# Patient Record
Sex: Female | Born: 1965
Health system: Southern US, Community
[De-identification: ages and names within clinical notes are randomized; demographics above are authoritative.]

## PROBLEM LIST (undated history)

## (undated) DIAGNOSIS — R600 Localized edema: Secondary | ICD-10-CM

## (undated) DIAGNOSIS — K829 Disease of gallbladder, unspecified: Secondary | ICD-10-CM

## (undated) DIAGNOSIS — R112 Nausea with vomiting, unspecified: Secondary | ICD-10-CM

## (undated) DIAGNOSIS — N979 Female infertility, unspecified: Secondary | ICD-10-CM

## (undated) DIAGNOSIS — J45909 Unspecified asthma, uncomplicated: Secondary | ICD-10-CM

## (undated) DIAGNOSIS — B350 Tinea barbae and tinea capitis: Secondary | ICD-10-CM

## (undated) DIAGNOSIS — Z9889 Other specified postprocedural states: Secondary | ICD-10-CM

## (undated) DIAGNOSIS — E669 Obesity, unspecified: Secondary | ICD-10-CM

## (undated) DIAGNOSIS — E559 Vitamin D deficiency, unspecified: Secondary | ICD-10-CM

## (undated) DIAGNOSIS — F1010 Alcohol abuse, uncomplicated: Secondary | ICD-10-CM

## (undated) DIAGNOSIS — L4050 Arthropathic psoriasis, unspecified: Secondary | ICD-10-CM

## (undated) DIAGNOSIS — Z973 Presence of spectacles and contact lenses: Secondary | ICD-10-CM

## (undated) DIAGNOSIS — F32A Depression, unspecified: Secondary | ICD-10-CM

## (undated) DIAGNOSIS — I4891 Unspecified atrial fibrillation: Secondary | ICD-10-CM

## (undated) DIAGNOSIS — J189 Pneumonia, unspecified organism: Secondary | ICD-10-CM

## (undated) DIAGNOSIS — E7800 Pure hypercholesterolemia, unspecified: Secondary | ICD-10-CM

## (undated) DIAGNOSIS — T7840XA Allergy, unspecified, initial encounter: Secondary | ICD-10-CM

## (undated) DIAGNOSIS — G4730 Sleep apnea, unspecified: Secondary | ICD-10-CM

## (undated) DIAGNOSIS — M654 Radial styloid tenosynovitis [de Quervain]: Secondary | ICD-10-CM

## (undated) DIAGNOSIS — I499 Cardiac arrhythmia, unspecified: Secondary | ICD-10-CM

## (undated) DIAGNOSIS — R002 Palpitations: Secondary | ICD-10-CM

## (undated) DIAGNOSIS — F1990 Other psychoactive substance use, unspecified, uncomplicated: Secondary | ICD-10-CM

## (undated) DIAGNOSIS — E282 Polycystic ovarian syndrome: Secondary | ICD-10-CM

## (undated) DIAGNOSIS — F419 Anxiety disorder, unspecified: Secondary | ICD-10-CM

## (undated) DIAGNOSIS — D689 Coagulation defect, unspecified: Secondary | ICD-10-CM

## (undated) DIAGNOSIS — M1990 Unspecified osteoarthritis, unspecified site: Secondary | ICD-10-CM

## (undated) DIAGNOSIS — M549 Dorsalgia, unspecified: Secondary | ICD-10-CM

## (undated) DIAGNOSIS — K760 Fatty (change of) liver, not elsewhere classified: Secondary | ICD-10-CM

## (undated) DIAGNOSIS — D649 Anemia, unspecified: Secondary | ICD-10-CM

## (undated) DIAGNOSIS — M2550 Pain in unspecified joint: Secondary | ICD-10-CM

## (undated) DIAGNOSIS — R0602 Shortness of breath: Secondary | ICD-10-CM

## (undated) HISTORY — DX: Unspecified atrial fibrillation: I48.91

## (undated) HISTORY — DX: Vitamin D deficiency, unspecified: E55.9

## (undated) HISTORY — DX: Female infertility, unspecified: N97.9

## (undated) HISTORY — DX: Polycystic ovarian syndrome: E28.2

## (undated) HISTORY — DX: Unspecified asthma, uncomplicated: J45.909

## (undated) HISTORY — PX: BREAST BIOPSY: SHX20

## (undated) HISTORY — DX: Tinea barbae and tinea capitis: B35.0

## (undated) HISTORY — DX: Dorsalgia, unspecified: M54.9

## (undated) HISTORY — DX: Allergy, unspecified, initial encounter: T78.40XA

## (undated) HISTORY — DX: Pain in unspecified joint: M25.50

## (undated) HISTORY — DX: Radial styloid tenosynovitis (de quervain): M65.4

## (undated) HISTORY — DX: Other psychoactive substance use, unspecified, uncomplicated: F19.90

## (undated) HISTORY — PX: COLONOSCOPY: SHX174

## (undated) HISTORY — DX: Anemia, unspecified: D64.9

## (undated) HISTORY — DX: Localized edema: R60.0

## (undated) HISTORY — PX: WISDOM TOOTH EXTRACTION: SHX21

## (undated) HISTORY — DX: Shortness of breath: R06.02

## (undated) HISTORY — DX: Sleep apnea, unspecified: G47.30

## (undated) HISTORY — DX: Cardiac arrhythmia, unspecified: I49.9

## (undated) HISTORY — DX: Obesity, unspecified: E66.9

## (undated) HISTORY — DX: Pure hypercholesterolemia, unspecified: E78.00

## (undated) HISTORY — DX: Unspecified osteoarthritis, unspecified site: M19.90

## (undated) HISTORY — DX: Alcohol abuse, uncomplicated: F10.10

## (undated) HISTORY — DX: Coagulation defect, unspecified: D68.9

## (undated) HISTORY — DX: Palpitations: R00.2

## (undated) HISTORY — PX: KNEE ARTHROPLASTY: SHX992

## (undated) HISTORY — PX: OTHER SURGICAL HISTORY: SHX169

## (undated) HISTORY — PX: CARPAL TUNNEL RELEASE: SHX101

## (undated) HISTORY — DX: Disease of gallbladder, unspecified: K82.9

## (undated) HISTORY — DX: Depression, unspecified: F32.A

## (undated) HISTORY — DX: Fatty (change of) liver, not elsewhere classified: K76.0

---

## 1998-01-11 ENCOUNTER — Encounter: Admission: RE | Admit: 1998-01-11 | Discharge: 1998-04-11 | Payer: Self-pay | Admitting: Internal Medicine

## 1998-05-10 ENCOUNTER — Encounter: Admission: RE | Admit: 1998-05-10 | Discharge: 1998-08-08 | Payer: Self-pay | Admitting: Internal Medicine

## 1998-09-03 ENCOUNTER — Other Ambulatory Visit: Admission: RE | Admit: 1998-09-03 | Discharge: 1998-09-03 | Payer: Self-pay | Admitting: Urology

## 1999-01-27 ENCOUNTER — Encounter: Admission: RE | Admit: 1999-01-27 | Discharge: 1999-02-25 | Payer: Self-pay | Admitting: *Deleted

## 1999-02-07 ENCOUNTER — Encounter: Admission: RE | Admit: 1999-02-07 | Discharge: 1999-05-08 | Payer: Self-pay | Admitting: Internal Medicine

## 1999-06-02 ENCOUNTER — Encounter: Payer: Self-pay | Admitting: Obstetrics and Gynecology

## 1999-06-02 ENCOUNTER — Ambulatory Visit (HOSPITAL_COMMUNITY): Admission: RE | Admit: 1999-06-02 | Discharge: 1999-06-02 | Payer: Self-pay | Admitting: Obstetrics and Gynecology

## 1999-06-13 HISTORY — PX: WRIST SURGERY: SHX841

## 1999-07-20 ENCOUNTER — Other Ambulatory Visit: Admission: RE | Admit: 1999-07-20 | Discharge: 1999-07-20 | Payer: Self-pay | Admitting: Obstetrics and Gynecology

## 1999-10-03 ENCOUNTER — Ambulatory Visit (HOSPITAL_BASED_OUTPATIENT_CLINIC_OR_DEPARTMENT_OTHER): Admission: RE | Admit: 1999-10-03 | Discharge: 1999-10-03 | Payer: Self-pay | Admitting: Orthopedic Surgery

## 2000-04-30 ENCOUNTER — Ambulatory Visit (HOSPITAL_COMMUNITY): Admission: RE | Admit: 2000-04-30 | Discharge: 2000-04-30 | Payer: Self-pay | Admitting: Otolaryngology

## 2000-04-30 ENCOUNTER — Encounter: Payer: Self-pay | Admitting: Otolaryngology

## 2000-10-11 ENCOUNTER — Other Ambulatory Visit: Admission: RE | Admit: 2000-10-11 | Discharge: 2000-10-11 | Payer: Self-pay | Admitting: Obstetrics and Gynecology

## 2001-05-30 ENCOUNTER — Encounter: Payer: Self-pay | Admitting: *Deleted

## 2001-05-30 ENCOUNTER — Encounter: Admission: RE | Admit: 2001-05-30 | Discharge: 2001-05-30 | Payer: Self-pay | Admitting: *Deleted

## 2001-07-13 ENCOUNTER — Inpatient Hospital Stay (HOSPITAL_COMMUNITY): Admission: AD | Admit: 2001-07-13 | Discharge: 2001-07-13 | Payer: Self-pay | Admitting: Obstetrics and Gynecology

## 2001-11-21 ENCOUNTER — Other Ambulatory Visit: Admission: RE | Admit: 2001-11-21 | Discharge: 2001-11-21 | Payer: Self-pay | Admitting: Obstetrics and Gynecology

## 2003-04-28 ENCOUNTER — Ambulatory Visit (HOSPITAL_COMMUNITY): Admission: RE | Admit: 2003-04-28 | Discharge: 2003-04-28 | Payer: Self-pay | Admitting: Obstetrics and Gynecology

## 2003-07-06 ENCOUNTER — Other Ambulatory Visit: Admission: RE | Admit: 2003-07-06 | Discharge: 2003-07-06 | Payer: Self-pay | Admitting: Obstetrics and Gynecology

## 2004-03-22 ENCOUNTER — Encounter: Admission: RE | Admit: 2004-03-22 | Discharge: 2004-03-22 | Payer: Self-pay | Admitting: Obstetrics and Gynecology

## 2004-04-26 ENCOUNTER — Encounter: Admission: RE | Admit: 2004-04-26 | Discharge: 2004-04-26 | Payer: Self-pay | Admitting: Internal Medicine

## 2005-02-20 ENCOUNTER — Ambulatory Visit (HOSPITAL_COMMUNITY): Admission: RE | Admit: 2005-02-20 | Discharge: 2005-02-20 | Payer: Self-pay | Admitting: Family Medicine

## 2005-10-27 ENCOUNTER — Ambulatory Visit (HOSPITAL_COMMUNITY): Admission: RE | Admit: 2005-10-27 | Discharge: 2005-10-27 | Payer: Self-pay | Admitting: Allergy

## 2006-05-04 ENCOUNTER — Emergency Department (HOSPITAL_COMMUNITY): Admission: EM | Admit: 2006-05-04 | Discharge: 2006-05-04 | Payer: Self-pay | Admitting: Family Medicine

## 2006-05-29 ENCOUNTER — Encounter: Admission: RE | Admit: 2006-05-29 | Discharge: 2006-06-20 | Payer: Self-pay | Admitting: Occupational Medicine

## 2007-05-02 ENCOUNTER — Encounter: Admission: RE | Admit: 2007-05-02 | Discharge: 2007-05-02 | Payer: Self-pay | Admitting: Obstetrics and Gynecology

## 2007-12-01 ENCOUNTER — Emergency Department (HOSPITAL_COMMUNITY): Admission: EM | Admit: 2007-12-01 | Discharge: 2007-12-01 | Payer: Self-pay | Admitting: Family Medicine

## 2007-12-01 ENCOUNTER — Ambulatory Visit (HOSPITAL_COMMUNITY): Admission: RE | Admit: 2007-12-01 | Discharge: 2007-12-01 | Payer: Self-pay | Admitting: Emergency Medicine

## 2008-05-28 ENCOUNTER — Emergency Department (HOSPITAL_COMMUNITY): Admission: EM | Admit: 2008-05-28 | Discharge: 2008-05-28 | Payer: Self-pay | Admitting: Emergency Medicine

## 2008-09-04 ENCOUNTER — Encounter: Admission: RE | Admit: 2008-09-04 | Discharge: 2008-09-04 | Payer: Self-pay | Admitting: Obstetrics and Gynecology

## 2009-04-07 ENCOUNTER — Ambulatory Visit: Payer: Self-pay | Admitting: Family Medicine

## 2009-04-07 DIAGNOSIS — E669 Obesity, unspecified: Secondary | ICD-10-CM | POA: Insufficient documentation

## 2009-04-07 DIAGNOSIS — E282 Polycystic ovarian syndrome: Secondary | ICD-10-CM | POA: Insufficient documentation

## 2009-04-07 DIAGNOSIS — M999 Biomechanical lesion, unspecified: Secondary | ICD-10-CM | POA: Insufficient documentation

## 2009-05-10 ENCOUNTER — Encounter: Payer: Self-pay | Admitting: Family Medicine

## 2009-05-10 ENCOUNTER — Ambulatory Visit: Payer: Self-pay | Admitting: Family Medicine

## 2009-05-10 DIAGNOSIS — H60399 Other infective otitis externa, unspecified ear: Secondary | ICD-10-CM | POA: Insufficient documentation

## 2009-05-10 LAB — CONVERTED CEMR LAB
Cholesterol: 204 mg/dL — ABNORMAL HIGH (ref 0–200)
Glucose, Bld: 116 mg/dL — ABNORMAL HIGH (ref 70–99)
HDL: 59 mg/dL (ref >39)
LDL Cholesterol: 121 mg/dL — ABNORMAL HIGH (ref 0–99)
Total CHOL/HDL Ratio: 3.5
Triglycerides: 118 mg/dL (ref <150)
VLDL: 24 mg/dL (ref 0–40)

## 2009-05-16 ENCOUNTER — Encounter: Payer: Self-pay | Admitting: Family Medicine

## 2009-05-25 ENCOUNTER — Ambulatory Visit: Payer: Self-pay | Admitting: Family Medicine

## 2009-05-25 DIAGNOSIS — E785 Hyperlipidemia, unspecified: Secondary | ICD-10-CM

## 2009-05-25 DIAGNOSIS — E1169 Type 2 diabetes mellitus with other specified complication: Secondary | ICD-10-CM | POA: Insufficient documentation

## 2009-05-25 DIAGNOSIS — M999 Biomechanical lesion, unspecified: Secondary | ICD-10-CM | POA: Insufficient documentation

## 2009-06-22 ENCOUNTER — Ambulatory Visit: Payer: Self-pay | Admitting: Family Medicine

## 2009-06-23 ENCOUNTER — Ambulatory Visit: Payer: Self-pay | Admitting: Family Medicine

## 2009-06-23 LAB — CONVERTED CEMR LAB
Cholesterol, target level: 200 mg/dL
HDL goal, serum: 40 mg/dL
LDL Goal: 160 mg/dL

## 2009-07-20 ENCOUNTER — Ambulatory Visit: Payer: Self-pay | Admitting: Family Medicine

## 2009-08-30 ENCOUNTER — Ambulatory Visit: Payer: Self-pay | Admitting: Family Medicine

## 2009-09-22 ENCOUNTER — Encounter: Payer: Self-pay | Admitting: Family Medicine

## 2010-03-15 ENCOUNTER — Ambulatory Visit: Payer: Self-pay | Admitting: Family Medicine

## 2010-03-15 DIAGNOSIS — M654 Radial styloid tenosynovitis [de Quervain]: Secondary | ICD-10-CM | POA: Insufficient documentation

## 2010-03-15 DIAGNOSIS — M7710 Lateral epicondylitis, unspecified elbow: Secondary | ICD-10-CM | POA: Insufficient documentation

## 2010-03-15 DIAGNOSIS — L259 Unspecified contact dermatitis, unspecified cause: Secondary | ICD-10-CM | POA: Insufficient documentation

## 2010-04-20 ENCOUNTER — Telehealth: Payer: Self-pay | Admitting: Family Medicine

## 2010-04-25 ENCOUNTER — Telehealth: Payer: Self-pay | Admitting: Family Medicine

## 2010-05-10 ENCOUNTER — Telehealth: Payer: Self-pay | Admitting: *Deleted

## 2010-05-20 ENCOUNTER — Ambulatory Visit: Payer: Self-pay | Admitting: Family Medicine

## 2010-06-12 HISTORY — PX: DE QUERVAIN'S RELEASE: SHX1439

## 2010-06-22 ENCOUNTER — Ambulatory Visit
Admission: RE | Admit: 2010-06-22 | Discharge: 2010-06-22 | Payer: Self-pay | Source: Home / Self Care | Attending: Orthopedic Surgery | Admitting: Orthopedic Surgery

## 2010-06-27 LAB — POCT HEMOGLOBIN-HEMACUE: Hemoglobin: 15.7 g/dL — ABNORMAL HIGH (ref 12.0–15.0)

## 2010-07-03 ENCOUNTER — Encounter: Payer: Self-pay | Admitting: Obstetrics and Gynecology

## 2010-07-14 NOTE — Assessment & Plan Note (Signed)
Summary: adjustment,df   Vital Signs:  Patient profile:   45 year old female Height:      61.5 inches Weight:      267.1 pounds BMI:     49.83 Temp:     98.6 degrees F oral Pulse rate:   90 / minute BP sitting:   119 / 84  (left arm) Cuff size:   large  Vitals Entered By: Garen Grams LPN (May 20, 2010 11:25 AM) CC: adjustment Is Patient Diabetic? No   Primary Care Provider:  Antoine Primas DO  CC:  adjustment.  History of Present Illness: 45 yo for Follow up on dandruff.  has been getting better but is still having trouble, has switched her shampoo and used ketokonazole with minimal improvement.  Pt would like to be a little more aggressive.   Pt denies any bleeding recently and no new areas and has imporved overall.  Denies fever, chills, nausea, vomiting, diarrhea or constipation   Pt is also here for OMT has had some lower back pain and right hip pain. Pt still able to do all ADL's    Habits & Providers  Alcohol-Tobacco-Diet     Tobacco Status: quit  Current Medications (verified): 1)  Metformin Hcl 500 Mg Tabs (Metformin Hcl) .... Take One Pill Daily 2)  Cresylate 25 % Soln (Cresyl Acetate) .... Apply 2-4 Drops in Each Ear Every 8 Hours.  Allow Solution To Sit in Ear Canal For 20 Minutes 3)  Simvastatin 10 Mg Tabs (Simvastatin) .... Take One Tab By Mouth Once Daily 4)  Ketoconazole 2 % Sham (Ketoconazole) .... Apply To Scalp in Shower Every Other Day. 5)  Nystatin-Triamcinolone 100000-0.1 Unit/gm-% Crea (Nystatin-Triamcinolone) .... Apply To Scal Daily For The Next 10 Days  Allergies (verified): No Known Drug Allergies  Past History:  Past medical, surgical, family and social histories (including risk factors) reviewed, and no changes noted (except as noted below).  Past Medical History: Reviewed history from 04/07/2009 and no changes required. PCOS, obesity, elevated LFT after voltarin, allergies (takes allergy shots).  Past Surgical History: Reviewed  history from 04/07/2009 and no changes required. none  Family History: Reviewed history from 04/07/2009 and no changes required. diabetes, htn, obesity  Social History: Reviewed history from 04/07/2009 and no changes required. drinks a martini at night otherwise doesn't drink.  doesn't smoke and no illicits Married- over 17 years lives with husband, dog and cat  Review of Systems       denies fever, chills, nausea, vomiting, diarrhea or constipation   Physical Exam  General:  Well-developed,well-nourished,in no acute distress; alert,appropriate and cooperative throughout examination Head:  pt has dandruff, erythema of scalp mostly above ears, no open sores, no more involvement of eyebrows Eyes:  No corneal or conjunctival inflammation noted. EOMI. Perrla. Mouth:  Oral mucosa and oropharynx without lesions or exudates.  Teeth in good repair. Neck:  no LAD Lungs:  Normal respiratory effort, chest expands symmetrically. Lungs are clear to auscultation, no crackles or wheezes. Heart:  Normal rate and regular rhythm. S1 and S2 normal without gallop, murmur, click, rub or other extra sounds. Abdomen:  Bowel sounds positive,abdomen soft and non-tender without masses, organomegaly or hernias noted. Msk:  OMT findings  C3RS left C5 RS right T3RS left 1st rib elevated T7 RS right Tight psoasis bialterally, L2 RS right  Sacrum right on right.   Pulses:  2+ Extremities:  NVI in all extremities.  Neurologic:  No cranial nerve deficits noted. Station and gait  are normal. Plantar reflexes are down-going bilaterally. DTRs are symmetrical throughout. Sensory, motor and coordinative functions appear intact.   Impression & Recommendations:  Problem # 1:  DERMATITIS, SCALP (ICD-692.9) improvement but slow, will give mild topical steroid for the next week to try to see if we can get improvement and then will continue same regimen that seems to be working but slowly.  Orders: [facility]- Est Level   3 (29562)  Problem # 2:  NONALLOPATHIC LESION OF RIB CAGE NEC (ICD-739.8) After verbal consent was helped with FPR technique.  Orders: [facility]- Est Level  3 (99213) OMT 1-2 Body [facility]ions (13086)  Problem # 3:  NONALLOPATHIC LESION OF LUMBAR REGION NEC (ICD-739.3) HVLA on side improved Orders: [facility]- Est Level  3 (99213) OMT 1-2 Body [place]ions (57846)  Complete Medication List: 1)  Metformin Hcl 500 Mg Tabs (Metformin hcl) .... Take one pill daily 2)  Cresylate 25 % Soln (Cresyl acetate) .... Apply 2-4 drops in each ear every 8 hours.  allow solution to sit in ear canal for 20 minutes 3)  Simvastatin 10 Mg Tabs (Simvastatin) .... Take one tab by mouth once daily 4)  Ketoconazole 2 % Sham (Ketoconazole) .... Apply to scalp in shower every other day. 5)  Nystatin-triamcinolone 100000-0.1 Unit/gm-% Crea (Nystatin-triamcinolone) .... Apply to scal daily for the next 10 days Prescriptions: NYSTATIN-TRIAMCINOLONE 100000-0.1 UNIT/GM-% CREA (NYSTATIN-TRIAMCINOLONE) apply to scal daily for the next 10 days  #1 large x 3   Entered and Authorized by:   Antoine Primas DO   Signed by:   Antoine Primas DO on 05/20/2010   Method used:   [facility]  [address] [phone]* (retail)       [address]       [place], [place]  [address]       Ph: [phone] or [phone]       Fax: [phone]   RxID:   [id number]    Orders Added: 1)  [facility]- Est Level  3 [30160] 2)  OMT 1-2 Body [facility] [10932]

## 2010-07-14 NOTE — Assessment & Plan Note (Signed)
Summary: NP,tcb   Vital Signs:  Patient profile:   45 year old female Height:      61.5 inches Weight:      267 pounds BMI:     49.81 BSA:     2.15 Temp:     98.9 degrees F Pulse rate:   92 / minute BP sitting:   143 / 82  Vitals Entered By: Jone Baseman CMA (April 07, 2009 3:17 PM) CC: new patient Is Patient Diabetic? No Pain Assessment Patient in pain? no        Primary Care Provider:  Antoine Primas DO  CC:  new patient.  History of Present Illness: Pt is here as a new patient.  Pt states she is feeling fine and has only a couple things to talke about.  hx of PCOS.  Pt was diagnosed much earlier in life and used to be taking metformin but never took any OCP.  Pt was up to 1000mg  of metformin but had GI discomfort.  Pt physician at that time did not want to go down on dose and then did not wan tto help the pt in any other fashion until she lost weight.  Pt states she has had no pain, but since she has been off the metformin for about two years, her periods have mostly stopped. Pt states she does not believe she has gone through menopause yet and does state she used to be irregular before the metformin.    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  pt states it is lower back, painful with movement after a long day or if she walks a lot. Pt has never had her back completely go "out" or lock up on her and has no history of trauma.   Left knee pain:  Pt has noticed her knee pain has increase overtime now and she is unable to walk more than a block without pain.  Pt feels the pain on the inside of the knee and pain does not radiate.  Pt gives a history of a fall that she remembers flling on it but does not know if the pain was from that or not.  The knee never gives out and it never swells or turns red.  Obesity:  Pt is very motivated to lose weight and she knows it is important.  She has had some high stress and has been unable to cook as much  as she would like and instead has been doing a lot of fast food.  Pt would like a nutrition consult for some guidance.  Pt does walk almost daily but her back/ knee starts to hurt.   Pt already has a mammogram ordered and scheduled.  no abnormal findings ever.   Habits & Providers  Alcohol-Tobacco-Diet     Tobacco Status: never  Current Problems (verified): 1)  Nonallopathic Lesion of Lumbar Region Nec  (ICD-739.3) 2)  Obesity, Unspecified  (ICD-278.00) 3)  Polycystic Ovaries  (ICD-256.4)  Current Medications (verified): 1)  Metformin Hcl 500 Mg Tabs (Metformin Hcl) .... Take One Pill Daily  Allergies (verified): No Known Drug Allergies  Past History:  Past Medical History: PCOS, obesity, elevated LFT after voltarin, allergies (takes allergy shots).  Past Surgical History: none  Family History: diabetes, htn, obesity  Social History: drinks a martini at night otherwise doesn't drink.  doesn't smoke and no illicits Married- over 17 years lives with husband, dog and cat Smoking Status:  never  Review of Systems       denies fever, chills, nausea, vomiting, diarrhea or constipation   Physical Exam  General:  Well-developed,well-nourished,in no acute distress; alert,appropriate and cooperative throughout examination, obese Eyes:  No corneal or conjunctival inflammation noted. EOMI. Perrla. Ears:  wax bilaterally TM intact Mouth:  Oral mucosa and oropharynx without lesions or exudates.  Teeth in good repair. Neck:  No deformities, masses, or tenderness noted. Lungs:  Normal respiratory effort, chest expands symmetrically. Lungs are clear to auscultation, no crackles or wheezes. Heart:  Normal rate and regular rhythm. S1 and S2 normal without gallop, murmur, click, rub or other extra sounds. Abdomen:  Bowel sounds positive,abdomen soft and non-tender without masses, organomegaly or hernias noted. obese Msk:  Pt has T5 RSr, then sacrum left posterior.   Pulses:  R and L  carotid,radial,femoral,dorsalis pedis and posterior tibial pulses are full and equal bilaterally Neurologic:  No cranial nerve deficits noted. Station and gait are normal. Plantar reflexes are down-going bilaterally. DTRs are symmetrical throughout. Sensory, motor and coordinative functions appear intact.    Impression & Recommendations:  Problem # 1:  POLYCYSTIC OVARIES (ICD-256.4) Assessment New Pt put on metformin 500mg  daily.  will reasses in 2 months.  Future Orders: Lipid-[facility] ([id number]) ... 04/08/2010 Glucose-[facility] ([id number]) ... 04/08/2010  Problem # 2:  OBESITY, UNSPECIFIED (ICD-278.00) Assessment: New Nutrition consult  Orders: Nutrition Referral (Nutrition)Future Orders: Lipid-[facility] ([id number]) ... 04/08/2010 Glucose-[facility] ([id number]) ... 04/08/2010  Problem # 3:  NONALLOPATHIC LESION OF LUMBAR REGION NEC (ICD-739.3) Assessment: New  Pt manipulated with improvement Given exercises to do reasses in 2 months  Orders: OMT 1-2 Body [place] (24401)  Problem # 4:  Screening Breast Cancer (ICD-V76.10) Assessment: Unchanged pt has mammogram already scheduled.  Complete Medication List: 1)  Metformin Hcl 500 Mg Tabs (Metformin hcl) .... Take one pill daily  Patient Instructions: 1)  It is great to see you. 2)  I want you to take metformin 500mg  once daily to start out and see how we do.   3)  I also am ordering a cholesterol and fasting glucose, you can do that at your convienence 4)  Exercises 5)  1. Continue to walk and wear good shoes 6)  2. I want you to do wall sits fo a total of 1:30 in 30 second intervals at first.  Do this everyday.  Increase in 10 days to 2 minutes and continue that for two weeks 7)  3.  I want you to take a towel and loosely roll it up under the knee then try to push and hold your knee down into the towle for 10 seconds.  Do this 10 times.   8)  4.  I want you to get on all fours and lift one arm up and hold it for 3 seconds then relax  for 2 seconds, do 10 reps and repeat on other side.  If you get good at this then lift opposite leg at same time.  This will strengthen your core and help with some of the back pain 9)  You might be pretty sore after this , take Ibuprofen for pain. 10)  I wouldn't be doing my job if I didn't bring up how weight loss will help but it sounds like you have a plan and if you need any help please don't hesitate to call. 11)  follow up in 1-2 months and will see your progress Prescriptions: METFORMIN HCL 500 MG TABS (METFORMIN HCL) take one pill daily  #  90 x 4   Entered and Authorized by:   Antoine Primas DO   Signed by:   Antoine Primas DO on 04/07/2009   Method used:   [facility]  [address] [phone]* (retail)       [address]       [place], [place]  [address]       Ph: [phone] or [phone]       Fax: [phone]   RxID:   [id number]

## 2010-07-14 NOTE — Assessment & Plan Note (Signed)
Summary: 278, 272.4; [employer] employee / [employer]   Vital Signs:  Patient profile:   45 year old female Height:      61.5 inches Weight:      272.2 pounds BMI:     50.78  Vitals Entered By: Wyona Almas PHD (June 22, 2009 10:18 AM)  Primary Care Provider:  Antoine Primas DO   History of Present Illness: Assessment:  Saw patient for 60 minutes.  Usual eating pattern includes 3 meals and variable snacks.  Tabitha [name] hopes to start strength-training classes at [facility], and to start wakling her dog, but currently does no regular exercise.  24-hr recall suggests kcal intake of  ~2800: B (6:45 AM)- 2 scr eggs, 1 c grits, water; Snk (9:30 AM)- 1 apple, Baby Belle soft cheese; L (1:45 PM)- chx breast, cauliflwr w/  ~1 tsp chevre, baked potato w/ 1 tsp butter & 2 tbsp sour cream, 10 oz diet Coke; Snk (5:45 PM)- 1 apple, Baby Belle soft cheese, 10 oz 2% milk; D (8 PM)-  ~6 oz steak & cheese sandwich on bagette, celery & carrots, martini.  This was a pretty normal food day for her.  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  She'll think about it, and let me know.    Nutrition Diagnosis:  Physical inactivity (NB-2.1) related to poor motivation as evidenced by no regular exercise in recent months.  Excessive fat intake (NI-51.2) related to meat, cheese, and condiments as evidenced by yesterday's intake of  ~100 g fat.    Intervention:  See Patient Instructions.    Monitoring/Eval:  Dietary intake, body weight, and exercise at 3-4 wk F/U.     Allergies: No Known Drug Allergies   Complete Medication List: 1)  Metformin Hcl 500 Mg Tabs (Metformin hcl) .... Take one pill daily 2)  Cresylate 25 % Soln (Cresyl acetate) .... Apply 2-4 drops in each ear every 8 hours.  allow solution to sit in ear canal for 20 minutes 3)  Simvastatin 10 Mg Tabs (Simvastatin) .... Take one tab by mouth once daily  Other Orders: Inital Assessment Each - [facility]  [phone])  Patient Instructions: 1)  Check out PCOS info by Raymond Gurney and the website BodyPens.ca.   2)  Initially, MEASURE portions, especially for high-fat foods AND carbohydrates.   3)  Limit carbohydrate foods to TWO per meal and an additional TWO total for snacks.  For portion sizes, see handout provided today.   4)  Carbohydrate foods:  Starchy foods like bread, rice, potatoes, corn, cereal, baked goods, desserts, pasta.  (We're not including fruit or milk foods as carb's for the purpose of limiting to two/meal.)   5)  Dried beans:  Count one cup as 2 portions (or exchanges).   6)  Limit fat to 40 grams per day AND choose high-quality fats:  Olive, canola, unsalted nuts and seeds, avocado, non-fried fish. Read lables for fat content.  Limit poor-quality fats: Saturated (animal fats), palm and coconut oil, veg shortening (trans fat).   7)  Unlimited veg's:  Obtain twice as many veg's as protein or carbohydrate foods for both lunch and dinner.  8)  Aim for at least 2 fruits per day. 9)  Follow-up appt:  Feb 8, 9 AM AT [facility] WE'LL DISCUSS EXERCISE!

## 2010-07-14 NOTE — Assessment & Plan Note (Signed)
Summary: FU/KH   Vital Signs:  Patient profile:   45 year old female Height:      61.5 inches Weight:      268.3 pounds BMI:     50.05 Temp:     98.2 degrees F oral Pulse rate:   84 / minute BP sitting:   132 / 83  (right arm) Cuff size:   large  Vitals Entered By: Garen Grams LPN (May 25, 2009 3:39 PM)  Primary Care Provider:  Antoine Primas DO   History of Present Illness: Here for follow up on otitis externa Ears- much better but still some irritation.  Pt is using her drops still daily, feels like they are improving.  Still can hear, and no dizziness  Neck pain/shoulder pain.  Right side comes and goes, but has been constant recently.  occurs with certain movements.  Does sleep with her hands under her pillow and next to a window and with a fan on.    First visit since getting lipids back, elevated will start statin, pt concurs and was told the side effects.   Allergies: No Known Drug Allergies  Past History:  Past medical, surgical, family and social histories (including risk factors) reviewed, and no changes noted (except as noted below).  Past Medical History: Reviewed history from 04/07/2009 and no changes required. PCOS, obesity, elevated LFT after voltarin, allergies (takes allergy shots).  Past Surgical History: Reviewed history from 04/07/2009 and no changes required. none  Family History: Reviewed history from 04/07/2009 and no changes required. diabetes, htn, obesity  Social History: Reviewed history from 04/07/2009 and no changes required. drinks a martini at night otherwise doesn't drink.  doesn't smoke and no illicits Married- over 17 years lives with husband, dog and cat  Review of Systems       denies fever, chills, nausea, vomiting, diarrhea or constipation   Physical Exam  General:  Vitals reviewed.  No fevers. Obese Head:  normal sinuses.  Minimally tender to palpation across maxillary. Eyes:  No corneal or conjunctival  inflammation noted. EOMI. Perrla. Ears:  R ear: canal is red and tender to inspection.  non tender.  TM intact without effusion of bulging L ear: canal is minimal redness, TM intact with area that looks as to be healing from previous rupture Chrinic scarring on TM bilateral. Lungs:  Normal respiratory effort, chest expands symmetrically. Lungs are clear to auscultation, no crackles or wheezes. Heart:  Normal rate and regular rhythm. S1 and S2 normal without gallop, murmur, click, rub or other extra sounds. Abdomen:  Bowel sounds positive,abdomen soft and non-tender without masses, organomegaly or hernias noted. obese Msk:  Elevated second rib on right, tender, some hypertonicity of muscles.   +hawkins and + empty can sign.  negative apprehension, negative neer    Impression & Recommendations:  Problem # 1:  OTITIS EXTERNA (ICD-380.10) Assessment Improved Continue drops.  Will re-evaluate next time, if no better will do cipro drops. Her updated medication list for this problem includes:    Cresylate 25 % Soln (Cresyl acetate) .Marland Kitchen... Apply 2-4 drops in each ear every 8 hours.  allow solution to sit in ear canal for 20 minutes  Orders: [facility]- Est  Level 4 (99214)  Problem # 2:  HYPERCHOLESTEROLEMIA (ICD-272.0) Assessment: Unchanged will get lft's at next visit Her updated medication list for this problem includes:    Simvastatin 10 Mg Tabs (Simvastatin) .Marland Kitchen... Take one tab by mouth once daily  Orders: [facility]- Est  Level 4 (16109)  Problem # 3:  NONALLOPATHIC LESION OF RIB CAGE NEC (ICD-739.8) treated with OMT responded well, might need motrin today but should feel better later.  Gave exercises for back strengthening to help reduce strain on nerve impingement of shoulder.   Orders: [facility]- Est  Level 4 (99214) OMT 1-2 Body Regions (16109)  Complete Medication List: 1)  Metformin Hcl 500 Mg Tabs (Metformin hcl) .... Take one pill daily 2)  Cresylate 25 % Soln (Cresyl acetate) .... Apply 2-4  drops in each ear every 8 hours.  allow solution to sit in ear canal for 20 minutes 3)  Simvastatin 10 Mg Tabs (Simvastatin) .... Take one tab by mouth once daily Prescriptions: SIMVASTATIN 10 MG TABS (SIMVASTATIN) take one tab by mouth once daily  #32 x 11   Entered and Authorized by:   Antoine Primas DO   Signed by:   Antoine Primas DO on 05/25/2009   Method used:   [facility]  [address] [phone]* (retail)       [address]       [place], [place]  [address]       Ph: [phone] or [phone]       Fax: [phone]   RxID:   [id number]

## 2010-07-14 NOTE — Assessment & Plan Note (Signed)
Summary: tendonitis/u/a to get relief & f/u labs/bmc   Vital Signs:  Patient profile:   45 year old female Height:      61.5 inches Weight:      269 pounds BMI:     50.18 Temp:     99.0 degrees F oral Pulse rate:   80 / minute BP sitting:   136 / 80  (right arm) Cuff size:   large  Vitals Entered By: Garen Grams LPN (March 15, 2010 1:52 PM) CC: pain/swelling in right elbow and left wrist Is Patient Diabetic? No   Primary Care Provider:  Antoine Primas DO  CC:  pain/swelling in right elbow and left wrist.  History of Present Illness: 45 yo female here for  1.  Right elbow pain-  Pt was doing multiple loads of laundry and had to hang dry clothes, after the day did have pain in arm and continue since then.  Pt has had "tennis elbow" in the past and feels this is very similar but it has been quite sometime since then.  Has tried some motrin 600mg  daily with little improvement.   2.  left wrist pain-  Pt has had some pain for the last two weeks seems to be mostly around her thumb and occurs with certain movements, unable to open up a can anymore. No numbness or tingling just pain, hurts at night as well sometimes, no injury that she can think of. Has trouble carrying things.   3.  dandruff-  Pt has noticed it behind her ears has never had it before has recently switched to another shampoo that was on sale, no bleeding in the scalp but has had dandruff.   4.  Pt has had a rough year with the death of her father, her mother in law who had a complete bowel obstruction and was hospitalized for a month and her business partner deserting her.  Pt states she is dealing well with the grief and knows we are here if she ever needs more support.  Pt has good support from family and friends.   Habits & Providers  Alcohol-Tobacco-Diet     Tobacco Status: quit  Current Medications (verified): 1)  Metformin Hcl 500 Mg Tabs (Metformin Hcl) .... Take One Pill Daily 2)  Cresylate 25 % Soln (Cresyl  Acetate) .... Apply 2-4 Drops in Each Ear Every 8 Hours.  Allow Solution To Sit in Ear Canal For 20 Minutes 3)  Simvastatin 10 Mg Tabs (Simvastatin) .... Take One Tab By Mouth Once Daily  Allergies (verified): No Known Drug Allergies  Past History:  Past medical, surgical, family and social histories (including risk factors) reviewed, and no changes noted (except as noted below).  Past Medical History: Reviewed history from 04/07/2009 and no changes required. PCOS, obesity, elevated LFT after voltarin, allergies (takes allergy shots).  Past Surgical History: Reviewed history from 04/07/2009 and no changes required. none  Family History: Reviewed history from 04/07/2009 and no changes required. diabetes, htn, obesity  Social History: Reviewed history from 04/07/2009 and no changes required. drinks a martini at night otherwise doesn't drink.  doesn't smoke and no illicits Married- over 17 years lives with husband, dog and cat  Review of Systems       see hpi and denies fever, chills, nausea, vomiting, diarrhea or constipation   Physical Exam  General:  Well-developed,well-nourished,in no acute distress; alert,appropriate and cooperative throughout examination Head:  pt has dandruff, erythema of scalp mostly behind ears, no open sores,  mild involevemnt of eyebrows Eyes:  No corneal or conjunctival inflammation noted. EOMI. Perrla. Lungs:  Normal respiratory effort, chest expands symmetrically. Lungs are clear to auscultation, no crackles or wheezes. Heart:  Normal rate and regular rhythm. S1 and S2 normal without gallop, murmur, click, rub or other extra sounds. Msk:  right elbow, mild edema over lateral epicondyle, no gross deformity minimal tender to palpation no erythema, no signs of trauma.  left wrist + finklstein test  Pulses:  2+ Extremities:  NVI in all extremities.    Impression & Recommendations:  Problem # 1:  LATERAL EPICONDYLITIS, RIGHT (ICD-726.32) pt  seems to have it minimal no signs of bursitis in area, pt told to try 600mg  three times a day of motrin for the next 6 days ice as needed  Orders: [facility]- Est  Level 4 (04540)  Problem # 2:  DE QUERVAIN'S TENOSYNOVITIS, LEFT WRIST (ICD-727.04) procedure note.  Pt had 2 cc of lidocaine and 2cc of kenolg injected into tendon sheath with 25 gauge needle after sterlized skin and consent signed.  Pt tolerated procedure well with significant improvement in symptoms.  Pt given red flags to look for, told that a brace at night may be helpful as well.  Orders: [facility]- Est  Level 4 (99214) Injection, small joint- [facility] (20600)  Problem # 3:  DERMATITIS, SCALP (ICD-692.9) Pt to try shampoo with zinc oxide or selineum in it for now.  If no improvement in 2 weeks will have pt return and will then try either a ketokonazole shampoo or steroid shapooo to help.   Orders: [facility]- Est  Level 4 (98119)  Complete Medication List: 1)  Metformin Hcl 500 Mg Tabs (Metformin hcl) .... Take one pill daily 2)  Cresylate 25 % Soln (Cresyl acetate) .... Apply 2-4 drops in each ear every 8 hours.  allow solution to sit in ear canal for 20 minutes 3)  Simvastatin 10 Mg Tabs (Simvastatin) .... Take one tab by mouth once daily

## 2010-07-14 NOTE — Assessment & Plan Note (Signed)
Summary: back pain,tcb   Vital Signs:  Patient profile:   45 year old female Weight:      265.8 pounds Temp:     98.0 degrees F Pulse rate:   90 / minute BP sitting:   154 / 90  Vitals Entered By: Starleen Blue RN 06/23/2009 CC: back pain, Lipid Management Is Patient Diabetic? No Pain Assessment Patient in pain? yes     Location: back Intensity: 2-5   Primary Care Provider:  Antoine Primas DO  CC:  back pain and Lipid Management.  History of Present Illness: Pt is here for back pain  Pain is in the lower back non radiating, but is stating to give her knee pain.  Pt seems to be worse with movement, relieved minimally with ibuoprofen.  Pt has had this in the past and manipulation did help  Pt also states she did start taking her cholesterol med.  States no side effect is intersted to see improvement.  obesity:  Pt has started some exercises.  Has lost 6 # since last visit.  Encourage to keep it up.    Lipid Management History:      Negative NCEP/ATP III risk factors include female age less than 45 years old and non-tobacco-user status.    Habits & Providers  Alcohol-Tobacco-Diet     Tobacco Status: quit  Current Medications (verified): 1)  Metformin Hcl 500 Mg Tabs (Metformin Hcl) .... Take One Pill Daily 2)  Cresylate 25 % Soln (Cresyl Acetate) .... Apply 2-4 Drops in Each Ear Every 8 Hours.  Allow Solution To Sit in Ear Canal For 20 Minutes 3)  Simvastatin 10 Mg Tabs (Simvastatin) .... Take One Tab By Mouth Once Daily  Allergies (verified): No Known Drug Allergies  Past History:  Past medical, surgical, family and social histories (including risk factors) reviewed, and no changes noted (except as noted below).  Past Medical History: Reviewed history from 04/07/2009 and no changes required. PCOS, obesity, elevated LFT after voltarin, allergies (takes allergy shots).  Past Surgical History: Reviewed history from 04/07/2009 and no changes  required. none  Family History: Reviewed history from 04/07/2009 and no changes required. diabetes, htn, obesity  Social History: Reviewed history from 04/07/2009 and no changes required. drinks a martini at night otherwise doesn't drink.  doesn't smoke and no illicits Married- over 17 years lives with husband, dog and cat Smoking Status:  quit  Review of Systems       deneis fever, chills, nausea, vomiting, diarrhea or constipation   Physical Exam  General:  Well-developed,well-nourished,in no acute distress; alert,appropriate and cooperative throughout examination Lungs:  Normal respiratory effort, chest expands symmetrically. Lungs are clear to auscultation, no crackles or wheezes. Heart:  Normal rate and regular rhythm. S1 and S2 normal without gallop, murmur, click, rub or other extra sounds. Abdomen:  Bowel sounds positive,abdomen soft and non-tender without masses, organomegaly or hernias noted. Msk:  Pt has L3-5 rotated to left and posterior right sacrum tender to palpation over right superior pole of sacrum.   Extremities:  2/4 pulses Neurologic:  No cranial nerve deficits noted. Station and gait are normal. Plantar reflexes are down-going bilaterally. DTRs are symmetrical throughout. Sensory, motor and coordinative functions appear intact.   Impression & Recommendations:  Problem # 1:  NONALLOPATHIC LESION OF LUMBAR REGION NEC (ICD-739.3) Assessment Unchanged iimprovement in manpulated with HVLA   Pt told of post tx flare sometimes to take ibuprofen, come again if worse Orders: [facility]- Est Level  3 (99213) OMT  1-2 [place] [phone])  Problem # 2:  OBESITY, UNSPECIFIED (ICD-278.00) Assessment: Improved lost 6 # continue Orders: [facility]- Est Level  3 (98119)  Problem # 3:  HYPERCHOLESTEROLEMIA (ICD-272.0)  Her updated medication list for this problem includes:    Simvastatin 10 Mg Tabs (Simvastatin) .Marland Kitchen... Take one tab by mouth once daily  Orders: [facility]- Est Level  3  (14782)  Complete Medication List: 1)  Metformin Hcl 500 Mg Tabs (Metformin hcl) .... Take one pill daily 2)  Cresylate 25 % Soln (Cresyl acetate) .... Apply 2-4 drops in each ear every 8 hours.  allow solution to sit in ear canal for 20 minutes 3)  Simvastatin 10 Mg Tabs (Simvastatin) .... Take one tab by mouth once daily  Lipid Assessment/Plan:      Based on NCEP/ATP III, the patient's risk factor category is "0-1 risk factors".  The patient's lipid goals are as follows: Total cholesterol goal is 200; LDL cholesterol goal is 160; HDL cholesterol goal is 40; Triglyceride goal is 150.

## 2010-07-14 NOTE — Assessment & Plan Note (Signed)
Summary: F/U /KH   Vital Signs:  Patient profile:   45 year old female Height:      61.5 inches Weight:      257.6 pounds BMI:     48.06  Vitals Entered By: Wyona Almas PHD (August 30, 2009 9:07 AM)  Primary Care Provider:  Antoine Primas DO   History of Present Illness: Assessment: Saw pt for 30 min.  [name] has had a pretty rough time since last seen at [facility].  She was sick with an intestinal virus, followed by the unexpected death of her dad.  Her emotional distress translated into some poor food choices for a couple of weeks, with no motivation to exercise.  She did walk last weekend, and has plans to start back consistently this week.  24-hr recall suggests kcal intake of 1600-1700: B (9 AM)- 1 1/2 boiled eggs, 1 slc banana bread, 12 oz 2% milk, 2 pc [place] bacon, blk coffee; L (1 PM)- 3 oz tuna, lots of celery, 1 T l-f mayo, 4 crackers (22 g CHO); Snk (4 PM)- 2 c veg soup, 1 orange; D (7 PM)- 6 oz salmon, 1/2 c couscous, 1 c steamed veg's.    Nutrition Diagnosis:  Regression on physical inactivity (NB-2.1) related to poor motivation as evidenced by no walking in recent weeks.  Little continued progress on excessive fat intake (NI-51.2) related to meat, cheese, and condiments as evidenced by yesterday's intake of  ~25 g fat at breakfast.   Intervention:  See Patient Instructions.    Monitoring/Eval:  Dietary intake, body weight, and exercise at 2-3 wk F/U.     Allergies: No Known Drug Allergies   Complete Medication List: 1)  Metformin Hcl 500 Mg Tabs (Metformin hcl) .... Take one pill daily 2)  Cresylate 25 % Soln (Cresyl acetate) .... Apply 2-4 drops in each ear every 8 hours.  allow solution to sit in ear canal for 20 minutes 3)  Simvastatin 10 Mg Tabs (Simvastatin) .... Take one tab by mouth once daily  Other Orders: Reassessment Each 15 min unit- [facility] (91478)  Patient Instructions: 1)  Walk at the school track:  4 X wk, at least 30 minutes.   2)  Try to incorporate small  bouts of exercise throughout the day.   3)  Continue to track daily food intake in your notebook.   4)  In designing your meals, think about components:  At least 3 compenents to each meal, i.e., veg, starch, protein foods.  5)  Schedule appt for 2-3 weeks (60 min) AT WHICH WE'LL DISCUSS EMOTIONAL EATING.

## 2010-07-14 NOTE — Letter (Signed)
Summary: Generic Letter  [facility]  [address]   [place], [place] [address]   Phone: [phone]  Fax: [phone]    05/16/2009  Tabitha [name] [address] [place], [place]  [address]  Dear Ms. Tabitha [name] [name]),  I got your lab tests back and your cholesterol is elevated at 204.  I would really like to have it at least less than 170.  Also, your LDL, the bad cholesterol, is elevated at 121, and I would love to at least have that lower than 100 and would aim for less than 90.  Good news is your good cholesterol is high and your glucose was only 116.  Because of these values I would like to continue the metformin at the same dose and hopefully you are ok with that.  I would though like to add a new medication for your cholesterol called simvistatin 20mg  by mouth daily. I have already sent the prescription into your pharmacy.  I do not think we will ever need to increase this as long as we continue to attempt to lose weight.  Also, I would love to see you again in the next 3-6 months to redraw blood and see how this medication is doing.  Hope all is well and you had a great thanksgiving and Happy Holidays!     Sincerely,   Antoine Primas DO  Appended Document: Generic Letter mailed.

## 2010-07-14 NOTE — Progress Notes (Signed)
  Phone Note Call from Patient   Caller: Patient Call For: [phone] Summary of Call: [facility] to [facility] [address] Initial call taken by: Abundio Miu,  April 25, 2010 1:42 PM  Follow-up for Phone Call        called tine told her that the patch is in and will try do not know the price though.  Follow-up by: Antoine Primas DO,  April 25, 2010 7:55 PM    New/Updated Medications: FLECTOR 1.3 % PTCH (DICLOFENAC EPOLAMINE) apply patch to area and change daily. Prescriptions: FLECTOR 1.3 % PTCH (DICLOFENAC EPOLAMINE) apply patch to area and change daily.  #30 x 1   Entered and Authorized by:   Antoine Primas DO   Signed by:   Antoine Primas DO on 04/25/2010   Method used:   [facility]        [facility]  [address] [phone]* (retail)       [address]       [place], [place]  [address]       Ph: [phone] or [phone]       Fax: [phone]   RxID:   [id number]

## 2010-07-14 NOTE — Consult Note (Signed)
Summary: [facility]  [facility]   Imported By: De Nurse 10/29/2009 15:36:20  _____________________________________________________________________  External Attachment:    Type:   Image     Comment:   External Document

## 2010-07-14 NOTE — Miscellaneous (Signed)
Summary: walk in  Clinical Lists Changes she is here for labs but wants to be seen for c/o bilateral ear pain with drainage. had head cold over a week ago. that got better but ears got worse. taking ibuprofen. work in at 1:30pm today. aware of wait & that pcp will not be seeing her.[place] Circle RN  May 10, 2009 11:52 AM

## 2010-07-14 NOTE — Progress Notes (Signed)
Summary: phn msg  Phone Note Call from Patient Call back at [phone]   Caller: Patient Summary of Call: hand is not any better and is now ready to be referred to specialist.  would like to see Dr Gean Birchwood, Orthopaedist - if that is OK  Initial call taken by: De Nurse,  May 10, 2010 11:31 AM  Follow-up for Phone Call        Send in refferal now.  Will see if Dr. Elita Quick will see pt.  Pt has had a injections within the last 3 months that did not help much and has been wearing a brace withouit much improvment.  Pt would like to know other options even surgical options.  Will have blue team call for appointment then call pt back with time.  Follow-up by: Antoine Primas DO,  May 10, 2010 2:55 PM  Additional Follow-up for Phone Call Additional follow up Details #1::        Appt scheduled for 05/19/10 at 11.15 with Dr. Turner Daniels, patient informed. Additional Follow-up by: Garen Grams LPN,  May 11, 2010 1:52 PM

## 2010-07-14 NOTE — Progress Notes (Signed)
  Phone Note Call from Patient Call back at [phone]   Caller: Patient Summary of Call: Pt told to call back for rx for dermatitis if previous tx didin't work.   [facility] [address] Initial call taken by: Abundio Miu,  April 20, 2010 12:19 PM  Follow-up for Phone Call        called and left message with pt that will send in rx for ketokonazole shampoo to try for the month.  This is for her dandruff Follow-up by: Antoine Primas DO,  April 20, 2010 12:21 PM    New/Updated Medications: KETOCONAZOLE 2 % SHAM (KETOCONAZOLE) apply to scalp in shower every other day. Prescriptions: KETOCONAZOLE 2 % SHAM (KETOCONAZOLE) apply to scalp in shower every other day.  #1 large x 1   Entered and Authorized by:   Antoine Primas DO   Signed by:   Antoine Primas DO on 04/20/2010   Method used:   [facility]        [facility]  [address] [phone]* (retail)       [address]       [place], [place]  [address]       Ph: [phone] or [phone]       Fax: [phone]   RxID:   [id number]

## 2010-07-14 NOTE — Assessment & Plan Note (Signed)
Summary: ear pain,drainage/[place]/[facility] pt   Vital Signs:  Patient profile:   45 year old female Height:      61.5 inches Weight:      268.3 pounds BMI:     50.05 Temp:     98.9 degrees F oral Pulse rate:   92 / minute BP sitting:   151 / 85  (right arm) Cuff size:   large  Vitals Entered By: Garen Grams LPN (May 10, 2009 2:32 PM) CC: bilateral ear pain and drainage Is Patient Diabetic? No Pain Assessment Patient in pain? yes     Location: both ears   Primary Care Provider:  Antoine Primas DO  CC:  bilateral ear pain and drainage.  History of Present Illness: 1. Ear pain and drainage:  Pt. with a "viral head cold" for the past couple of weeks that has recently been getting better.  She now complains of some bilateral ear pain R>L.  She was using Q-tips fairly aggressively up until recently.  She feels that the pain is most located in the external ear canal.  She doesn't feel like this is a sinus infection.  She has noticed some drainage from both ears at night when she wakes up its on the pillow.   ROS:  Complains of low grade fevers up to 99-100 and malaise  Habits & Providers  Alcohol-Tobacco-Diet     Tobacco Status: never  Current Medications (verified): 1)  Metformin Hcl 500 Mg Tabs (Metformin Hcl) .... Take One Pill Daily 2)  Cresylate 25 % Soln (Cresyl Acetate) .... Apply 2-4 Drops in Each Ear Every 8 Hours.  Allow Solution To Sit in Ear Canal For 20 Minutes  Allergies: No Known Drug Allergies  Past History:  Past Medical History: Reviewed history from 04/07/2009 and no changes required. PCOS, obesity, elevated LFT after voltarin, allergies (takes allergy shots).  Physical Exam  General:  Vitals reviewed.  No fevers. Obese Head:  normal sinuses.  Minimally tender to palpation across maxillary. Ears:  R ear: canal is red and tender to inspection.  ear hurts with traction on the tragus.  TM intact without effusion of bulding L ear: canal is clear,  minimal wax.  TM intact, pearly not bulging Nose:  no external deformity and no nasal discharge.   Mouth:  Oral mucosa and oropharynx without lesions or exudates.  Teeth in good repair. Neck:  no LAD Lungs:  Normal respiratory effort, chest expands symmetrically. Lungs are clear to auscultation, no crackles or wheezes.   Impression & Recommendations:  Problem # 1:  OTITIS EXTERNA (ICD-380.10)  mild otitis externa.  Will treat with an acidifying agent.  If not improved in a week would consider topical antibiotic. Her updated medication list for this problem includes:    Cresylate 25 % Soln (Cresyl acetate) .Marland Kitchen... Apply 2-4 drops in each ear every 8 hours.  allow solution to sit in ear canal for 20 minutes  Orders: [facility]- Est Level  3 ([address])  Complete Medication List: 1)  Metformin Hcl 500 Mg Tabs (Metformin hcl) .... Take one pill daily 2)  Cresylate 25 % Soln (Cresyl acetate) .... Apply 2-4 drops in each ear every 8 hours.  allow solution to sit in ear canal for 20 minutes Prescriptions: CRESYLATE 25 % SOLN (CRESYL ACETATE) Apply 2-4 drops in each ear every 8 hours.  Allow solution to sit in ear canal for 20 minutes  #1 x 1   Entered and Authorized by:   Angelena Sole MD  Signed by:   Angelena Sole MD on 05/10/2009   Method used:   [facility]        [facility]  [address] [phone]* (retail)       [address]       [place], [place]  [address]       Ph: [phone] or [phone]       Fax: [phone]   RxID:   [id number]

## 2010-07-14 NOTE — Assessment & Plan Note (Signed)
Summary: F/U VISIT/[facility]   Vital Signs:  Patient profile:   45 year old female Height:      61.5 inches Weight:      263.5 pounds BMI:     49.16  Vitals Entered By: Wyona Almas PHD (July 20, 2009 9:16 AM)  Primary Care Provider:  Antoine Primas DO   History of Present Illness: Assessment:  Spent 30 minutes with patient.  Tabitha [name] said she has been doing better planning her meals and snacks.  She has been getting a lot more veg's as well as fruit and other sources of fiber.  Her husband does most of the cooking, so she has less control, but as she said, they're both learning.  24-hr recall suggests kcal intake of 1100-1200: B (8:30 AM)- vegetable omelette, 1 slc whwht toast, 10 oz diet Pepsi; Snk (AM)- water; L (4 PM)- grilled chx sandw on white roll w/ let, tom, onion, pickles, 1/2 t mayo, 1/4 c chips, 10 oz d Pepsi; D (8 PM)- chx salad w/ almonds, raisins, mayo, mixed green salad w/ no dressing, martinini.  Tabitha [name] said yesterday was not representative of usual intake recently in that she was called in to work at the last minute, so had no breakfast or lunch planned/prepared.  She has started walking her dog, doing 1/2 mi 2-3 X wk.  Tabitha [name] is not checking BG levels, but when she was checking, fasting levels were  ~115.    Nutrition Diagnosis:  Slight progress on physical inactivity (NB-2.1) related to poor motivation as evidenced by walking  ~15 min 2-3 X wk.  Good progress on excessive fat intake (NI-51.2) related to meat, cheese, and condiments as evidenced by yesterday's intake of 50-60 g fat  ~half of previous 24-hr recall).      Intervention:  See Patient Instructions.    Monitoring/Eval:  Dietary intake, body weight, and exercise at 3-4 wk F/U.    Allergies: No Known Drug Allergies   Complete Medication List: 1)  Metformin Hcl 500 Mg Tabs (Metformin hcl) .... Take one pill daily 2)  Cresylate 25 % Soln (Cresyl acetate) .... Apply 2-4 drops in each ear every 8 hours.  allow solution to  sit in ear canal for 20 minutes 3)  Simvastatin 10 Mg Tabs (Simvastatin) .... Take one tab by mouth once daily  [facility]  Patient Instructions: 1)  On [facility] website, look for GLYCEMIC LOAD vs. GLYCEMIC INDEX.   2)  Increase exercise as tolerated, but be systematic as you increase.   3)  GOAL:  Walk on non-work days, geting 3 X wk of at least 15 min per walk.  By Mar 1, increase # of minutes by at least 5 (up to 25 min/walk beginning in April).  4)  RECORD # OF MINUTES WALKED EACH DAY.   5)  Food goals:  MEAL PLANNING:  (a) General idea of meals and snacks for the week's shopping; (b) Aim for a cooking day one day a week; (c) 24-hr advance planning.   6)  Check FBG at least once a week, and document.   7)  Follow-up appt: Mar 10 at 9 AM.

## 2010-07-16 ENCOUNTER — Encounter: Payer: Self-pay | Admitting: *Deleted

## 2010-07-27 ENCOUNTER — Encounter: Payer: Self-pay | Admitting: Family Medicine

## 2010-07-28 ENCOUNTER — Ambulatory Visit (INDEPENDENT_AMBULATORY_CARE_PROVIDER_SITE_OTHER): Payer: Commercial Managed Care - PPO | Admitting: Family Medicine

## 2010-07-28 ENCOUNTER — Encounter: Payer: Self-pay | Admitting: Family Medicine

## 2010-07-28 VITALS — BP 113/81 | HR 111 | Temp 99.3°F

## 2010-07-28 DIAGNOSIS — E669 Obesity, unspecified: Secondary | ICD-10-CM

## 2010-07-28 LAB — LDL CHOLESTEROL, DIRECT: Direct LDL: 110 mg/dL — ABNORMAL HIGH

## 2010-07-28 NOTE — Progress Notes (Signed)
  Subjective:    Patient ID: Tabitha [name], female    DOB: 04-11-1966, 45 y.o.   MRN: [id number]  HPI Back pain-  Pt is here for manipulation.  Pt states no injuries doing well but has the "regular aches and pains". Mostly mid back as usual pain with certain movements no radiation still able to do activities of daily living    Obesity-  Pt states social life has been hard has not stuck to diet and exercise at this time. Time for LDL and CMET though. Taking medications as prescribed.   Review of Systems     Objective:   Physical Exam Gen: NAD   OMT Findings: Cervical: C3 rotated and side bent left Thoracic: T7 rotated and side bent right Lumbar: L1 rotated and side bent left Sacrum: none    Assessment & Plan:

## 2010-07-29 ENCOUNTER — Telehealth: Payer: Self-pay | Admitting: Family Medicine

## 2010-07-29 DIAGNOSIS — R748 Abnormal levels of other serum enzymes: Secondary | ICD-10-CM

## 2010-07-29 LAB — COMPREHENSIVE METABOLIC PANEL
ALT: 335 U/L — ABNORMAL HIGH (ref 0–35)
AST: 155 U/L — ABNORMAL HIGH (ref 0–37)
Albumin: 4 g/dL (ref 3.5–5.2)
Alkaline Phosphatase: 84 U/L (ref 39–117)
BUN: 14 mg/dL (ref 6–23)
CO2: 26 mEq/L (ref 19–32)
Calcium: 9.4 mg/dL (ref 8.4–10.5)
Chloride: 103 mEq/L (ref 96–112)
Creat: 0.73 mg/dL (ref 0.40–1.20)
Glucose, Bld: 157 mg/dL — ABNORMAL HIGH (ref 70–99)
Potassium: 3.9 mEq/L (ref 3.5–5.3)
Sodium: 141 mEq/L (ref 135–145)
Total Bilirubin: 0.5 mg/dL (ref 0.3–1.2)
Total Protein: 7.1 g/dL (ref 6.0–8.3)

## 2010-07-29 NOTE — Assessment & Plan Note (Signed)
Called pt told her elevated LFT's.  Pt states she has had that trouble before and had fatty liver but never this high.  Denies any recent blood transfusion tylenol use, or abdominal pain or yellowing of the skin.  Pt states she has been worked up for this before and nothing came of it.  Told her we will discontinue the simvastatin for now will recheck labs in 1 month,  Pt to call and make appointment. If still elevated would get hep panel and would get imaging done. Pt agreeable to plan.

## 2010-07-29 NOTE — Telephone Encounter (Signed)
See a and p

## 2010-08-01 ENCOUNTER — Telehealth: Payer: Self-pay | Admitting: Family Medicine

## 2010-08-01 DIAGNOSIS — R748 Abnormal levels of other serum enzymes: Secondary | ICD-10-CM

## 2010-08-01 NOTE — Telephone Encounter (Signed)
Pt called states she is having right upper abdominal pain does not notice any association with food, but could be is feeling nauseated as well.  Pt states her bowel movements have changed as well and that they are more grey in color and loose.  No blood. Pt denies fever or chills still able to eat and drink.  With recent elevation in LFT will have pt get an ultrasound to see if this could be the cause.  Told pt we will call her on her mobile phone when we have it scheduled.

## 2010-08-04 ENCOUNTER — Ambulatory Visit (HOSPITAL_COMMUNITY)
Admission: RE | Admit: 2010-08-04 | Discharge: 2010-08-04 | Disposition: A | Discharge status: Home / Self Care | Payer: 59 | Source: Ambulatory Visit | Attending: Family Medicine | Admitting: Family Medicine

## 2010-08-04 ENCOUNTER — Other Ambulatory Visit: Payer: Self-pay | Admitting: Family Medicine

## 2010-08-04 ENCOUNTER — Telehealth: Payer: Self-pay | Admitting: Family Medicine

## 2010-08-04 DIAGNOSIS — R945 Abnormal results of liver function studies: Secondary | ICD-10-CM | POA: Insufficient documentation

## 2010-08-04 DIAGNOSIS — R748 Abnormal levels of other serum enzymes: Secondary | ICD-10-CM

## 2010-08-04 DIAGNOSIS — K7689 Other specified diseases of liver: Secondary | ICD-10-CM | POA: Insufficient documentation

## 2010-08-04 DIAGNOSIS — R109 Unspecified abdominal pain: Secondary | ICD-10-CM | POA: Insufficient documentation

## 2010-08-04 NOTE — Assessment & Plan Note (Addendum)
Called pt regarding abdominal u/s results told her fatty liver to continue off the zocor for now, pt states pain has improved, will have pt try miralax and colace to see if constipation is contributing, pt to f/u in 1 month and redraw LFT's to make sure down trending.

## 2010-08-04 NOTE — Telephone Encounter (Signed)
Called pt regarding abdominal u/s results told her fatty liver to continue off the zocor for now, pt states pain has improved, will have pt try miralax and colace to see if constipation is contributing, pt to f/u in 1 month and redraw LFT's to make sure down trending.  

## 2010-08-23 ENCOUNTER — Ambulatory Visit (INDEPENDENT_AMBULATORY_CARE_PROVIDER_SITE_OTHER): Payer: Commercial Managed Care - PPO | Admitting: Family Medicine

## 2010-08-23 ENCOUNTER — Encounter: Payer: Self-pay | Admitting: Family Medicine

## 2010-08-23 VITALS — BP 157/87 | HR 89 | Temp 98.6°F | Wt 273.4 lb

## 2010-08-23 DIAGNOSIS — L259 Unspecified contact dermatitis, unspecified cause: Secondary | ICD-10-CM

## 2010-08-23 DIAGNOSIS — E669 Obesity, unspecified: Secondary | ICD-10-CM

## 2010-08-23 DIAGNOSIS — M999 Biomechanical lesion, unspecified: Secondary | ICD-10-CM

## 2010-08-23 DIAGNOSIS — R7402 Elevation of levels of lactic acid dehydrogenase (LDH): Secondary | ICD-10-CM

## 2010-08-23 DIAGNOSIS — R7401 Elevation of levels of liver transaminase levels: Secondary | ICD-10-CM

## 2010-08-23 DIAGNOSIS — R748 Abnormal levels of other serum enzymes: Secondary | ICD-10-CM

## 2010-08-23 LAB — CONVERTED CEMR LAB
ALT: 152 units/L — ABNORMAL HIGH (ref 0–35)
AST: 76 units/L — ABNORMAL HIGH (ref 0–37)
Albumin: 4 g/dL (ref 3.5–5.2)
Alkaline Phosphatase: 77 units/L (ref 39–117)
BUN: 12 mg/dL (ref 6–23)
CO2: 25 meq/L (ref 19–32)
Calcium: 9.1 mg/dL (ref 8.4–10.5)
Chloride: 104 meq/L (ref 96–112)
Creatinine, Ser: 0.69 mg/dL (ref 0.40–1.20)
Glucose, Bld: 127 mg/dL — ABNORMAL HIGH (ref 70–99)
Potassium: 3.9 meq/L (ref 3.5–5.3)
Sodium: 138 meq/L (ref 135–145)
Total Bilirubin: 0.4 mg/dL (ref 0.3–1.2)
Total Protein: 6.7 g/dL (ref 6.0–8.3)

## 2010-08-23 LAB — COMPREHENSIVE METABOLIC PANEL
ALT: 152 U/L — ABNORMAL HIGH (ref 0–35)
AST: 76 U/L — ABNORMAL HIGH (ref 0–37)
Albumin: 4 g/dL (ref 3.5–5.2)
Alkaline Phosphatase: 77 U/L (ref 39–117)
BUN: 12 mg/dL (ref 6–23)
CO2: 25 mEq/L (ref 19–32)
Calcium: 9.1 mg/dL (ref 8.4–10.5)
Chloride: 104 mEq/L (ref 96–112)
Creat: 0.69 mg/dL (ref 0.40–1.20)
Glucose, Bld: 127 mg/dL — ABNORMAL HIGH (ref 70–99)
Potassium: 3.9 mEq/L (ref 3.5–5.3)
Sodium: 138 mEq/L (ref 135–145)
Total Bilirubin: 0.4 mg/dL (ref 0.3–1.2)
Total Protein: 6.7 g/dL (ref 6.0–8.3)

## 2010-08-23 NOTE — Assessment & Plan Note (Signed)
After verbal consent HVLA on side with marked improvement.

## 2010-08-23 NOTE — Assessment & Plan Note (Signed)
Marked improvement will continue current regimen.

## 2010-08-23 NOTE — Patient Instructions (Signed)
Good to see you We will get labs today I did adjust your back today I should probably see you in three months.  We will recheck cholesterol in 6 months.

## 2010-08-23 NOTE — Progress Notes (Signed)
  Subjective:    Patient ID: Tabitha [name], female    DOB: 12-07-1965, 45 y.o.   MRN: [id number]  HPI  Back pain-  Pt is here for manipulation.  Pt states no injuries doing well but has the "regular aches and pains". Mostly mid back as usual pain with certain movements no radiation still able to do activities of daily living  Abdominal pain-  Completely resolved since last visit, pt think mostly MSK in nature.  Pt was having right sided abdominal pain with elevated LFT's.  Abdominal u/s showed fatty liver but no gallstones per se.  Obesity-  Pt states social life has been hard has not stuck to diet and exercise at this time.  Taking medications as prescribed.    Review of Systems    Denies fever, chills, nausea vomiting abdominal pain, dysuria, chest pain, shortness of breath dyspnea on exertion or numbness in extremities  Objective:   Physical Exam     General Appearance:    Alert, cooperative, no distress, appears stated age  Head:    Normocephalic, without obvious abnormality, atraumatic scalp shows improvement still mild exfoliation but no bleeding or open lesions as before.   Eyes:    PERRL, conjunctiva/corneas clear, EOM's intact,  Throat:   Lips, mucosa, and tongue normal; teeth and gums normal  Neck:   Supple, symmetrical, trachea midline, no adenopathy;    thyroid:  no enlargement/tenderness/nodules; no carotid   bruit or JVD  Lungs:     Clear to auscultation bilaterally, respirations unlabored  Chest Wall:    No tenderness or deformity   Heart:    Regular rate and rhythm, S1 and S2 normal, no murmur, rub   or gallop  Abdomen:     Soft, non-tender, bowel sounds active all four quadrants,    no masses, no organomegaly  Extremities:   Extremities normal, atraumatic, no cyanosis or edema  Pulses:   2+ and symmetric all extremities  Skin:   Skin color, texture, turgor normal, no rashes or lesions  Neurologic:   CNII-XII intact, normal strength, sensation and reflexes   throughout    OMT Findings: Cervical: no findings Thoracic; T 5 rotated and side bent left, T7 rotated and side bent right Lumbar: L2 rotated and side bent right  Sacrum: normal Ilium right anterior. + standing test     Assessment & Plan:

## 2010-08-23 NOTE — Assessment & Plan Note (Signed)
Will recheck today now off zocor for 6 weeks.U/s showed only fatty liver no other imaging necessary at this time.

## 2010-08-23 NOTE — Assessment & Plan Note (Addendum)
Pt is making the changes with diet and trying to increase exercise. Goal weight would be 200 pounds, would think realistically talking about 2 years down the road, encouraged pt that this is possible but would need time to do so.

## 2010-08-24 ENCOUNTER — Telehealth: Payer: Self-pay | Admitting: Family Medicine

## 2010-08-24 NOTE — Telephone Encounter (Signed)
calle dpt told her the liver enzymes were improved, will get cholesterol in 6 months.   Pt also states that she needs a letter stating that the primary diagnosis from last office visit was obesity and that her insurance won't cover it.  She needs a letter I told her I would do what I can.

## 2010-10-28 NOTE — Op Note (Signed)
[facility]  Patient:    Tabitha [name]                            MRN: [id number] Proc. Date: 10/03/99 Adm. Date:  [id number] Attending:  Alinda Deem                           Operative Report  PREOPERATIVE DIAGNOSIS:  Right carpal tunnel syndrome.  POSTOPERATIVE DIAGNOSIS:  Right carpal tunnel syndrome.  OPERATION PERFORMED:  Right carpal tunnel release.  SURGEON:  Alinda Deem, M.D.  ASSISTANT:  None.  ANESTHESIA:  Bier block at the forearm.  ESTIMATED BLOOD LOSS:  Minimal.  FLUID REPLACEMENT:  500 cc of crystalloid.  DRAINS:  None.  TOURNIQUET TIME:  About 23 minutes.  INDICATIONS FOR PROCEDURE:  A 45 year old woman with documented carpal tunnel syndrome for the last few months has failed conservative treatment with splinting, anti-inflammatory medicines and only got temporary relief from a cortisone injection into the carpal tunnel.  She now desires carpal tunnel release on the right side and accepts the risks and benefits of surgery.  DESCRIPTION OF PROCEDURE:  The patient was identified by arm band and taken to the operating room at [facility] where the appropriate anesthetic monitors were attached and Bier block anesthesia induced at the right forearm. The right hand was prepped and draped in the usual sterile fashion from the fingertips to the forearm tourniquet and we began the procedure by making a longitudinal incision starting out at the wrist crease going distally, parallel and just to the ulnar side of the thenar crease.  Small bleeders in the skin and subcutaneous tissue were identified and cauterized and we identified the palmar fascia distally which was incised with the #15 blade allowing passage of a Therapist, nutritional into the carpal tunnel.  Satisfied over the position of the [place], we then cut down on the [place] performing a carpal tunnel release, exposing the median nerve which did have a  somewhat blanched hourglass appearance at the carpal tunnel.  We extended the fascial incision into the forearm fascia with the black handled scissors completing the release proximal to the carpal tunnel.  The median nerve was then examined as were the tendons of the carpal tunnel and all were found to be intact and there were no masses in the carpal tunnel.  The wound was washed out with normal saline solution.  Small bleeders were cauterized with bipolar electrocautery and the skin only was closed with running 5-0 nylon suture. A dressing of Xeroform, 4 x 4 dressing sponges, 2-inch Webril and an Ace wrap applied followed by a Velfoam splint.  The tourniquet was let down.  The patient was then taken to the recovery room without difficulty. DD:  10/03/99 TD:  10/03/99 Job: [id number]

## 2010-12-16 ENCOUNTER — Ambulatory Visit (INDEPENDENT_AMBULATORY_CARE_PROVIDER_SITE_OTHER): Payer: Commercial Managed Care - PPO | Admitting: Family Medicine

## 2010-12-16 ENCOUNTER — Encounter: Payer: Self-pay | Admitting: Family Medicine

## 2010-12-16 VITALS — BP 121/80 | HR 96 | Temp 98.6°F | Wt 269.0 lb

## 2010-12-16 DIAGNOSIS — M778 Other enthesopathies, not elsewhere classified: Secondary | ICD-10-CM | POA: Insufficient documentation

## 2010-12-16 DIAGNOSIS — M7581 Other shoulder lesions, right shoulder: Secondary | ICD-10-CM | POA: Insufficient documentation

## 2010-12-16 DIAGNOSIS — M67919 Unspecified disorder of synovium and tendon, unspecified shoulder: Secondary | ICD-10-CM

## 2010-12-16 MED ORDER — DIAZEPAM 2 MG PO TABS: 2.0000 mg | ORAL_TABLET | Freq: Two times a day (BID) | ORAL | Status: DC | PRN

## 2010-12-16 NOTE — Patient Instructions (Addendum)
Take Motrin 600mg  three times a day for the next three days I am giving you a prscription for valium.  Take 1 pill twice daily for next 3-5 days.  Do not mix with alcohol or drive first time you take it Do the exercises I given you I want to see you again in 2 weeks to make sure you are getting better

## 2010-12-16 NOTE — Assessment & Plan Note (Addendum)
Pt is to increase motrin to 3 times daily, given valium for the tight muscle as well.  Given theraband with list of exercises, given hand out about home remedies will see again in 2 weeks if not better.  At that time may need injection or topical voltaren would be next, may consider PT as well.

## 2010-12-16 NOTE — Progress Notes (Signed)
  Subjective:    Patient ID: Tabitha [name], female    DOB: 23-Dec-1965, 45 y.o.   MRN: [id number]  HPI Pt is here with right shoulder pain.  Started a couple weeks ago after sleeping on it funny she states hurts more with certain movements and lifting arm over her head, does not remember an injury.  Still able to do activities of daily living without much trouble but hurts at the end of the day.  Pt has been taking Motrin once daily for it which helps some, pain stays in the shoulder and back more thnn anything else new pain though. Denies any numbness or loss of sensation in hand no swelling noted.    Review of Systems See above. Past medical history, social, surgical and family history all reviewed.       Objective:   Physical Exam Gen: NAD obese.  Right shoulder-  Pt has decrease AROM but full PROM.  Pt has neg Hawkins, and Neg Neers, mild pain on apprehension, neg empty can test tender to palpation over posterior rhomboid and supraspinatus bursae feels mildly boggy and inflamed.  CV: RRR no mumur Pul: CTAb       Assessment & Plan:

## 2011-03-06 ENCOUNTER — Encounter: Payer: Self-pay | Admitting: Family Medicine

## 2011-03-06 ENCOUNTER — Ambulatory Visit (INDEPENDENT_AMBULATORY_CARE_PROVIDER_SITE_OTHER): Payer: 59 | Admitting: Family Medicine

## 2011-03-06 DIAGNOSIS — E669 Obesity, unspecified: Secondary | ICD-10-CM

## 2011-03-06 DIAGNOSIS — M129 Arthropathy, unspecified: Secondary | ICD-10-CM

## 2011-03-06 DIAGNOSIS — M1990 Unspecified osteoarthritis, unspecified site: Secondary | ICD-10-CM | POA: Insufficient documentation

## 2011-03-06 DIAGNOSIS — L259 Unspecified contact dermatitis, unspecified cause: Secondary | ICD-10-CM

## 2011-03-06 MED ORDER — PIMECROLIMUS 1 % EX CREA: TOPICAL_CREAM | Freq: Two times a day (BID) | CUTANEOUS | Status: DC

## 2011-03-06 MED ORDER — MELOXICAM 15 MG PO TABS: 15.0000 mg | ORAL_TABLET | Freq: Every day | ORAL | Status: DC

## 2011-03-06 NOTE — Assessment & Plan Note (Signed)
At this time we will try meloxicam and see if anti-inflammatories work being first-line for psoriatic arthritis. If patient does not improve then we will consider a prednisone burst and from there we will become more aggressive. Once we get to DMARD then we will consider referral to rheumatologist the patient seems to be well-controlled at this time.

## 2011-03-06 NOTE — Assessment & Plan Note (Addendum)
Patient has improvement with the we're using at this time. We will continue the same medications and see if patient continues

## 2011-03-06 NOTE — Progress Notes (Signed)
  Subjective:    Patient ID: Tabitha [name], female    DOB: 21-Dec-1965, 45 y.o.   MRN: [id number]  HPI 45 year old female coming in with arthritis.  Patient was seen by a dermatologist with nail findings as well as skin findings and some joint pain dermatologist made the diagnosis for psoriatic arthritis. Patient was very concerned and wanted to ask more questions about the problem. Patient was given a new cream to try which has helped but it is costing over $500 a month and needs something else. Patient states that she has been having some joint pain mostly on the right side with a right shoulder hip and knee. Patient though has had this problem for some time it does respond to ibuprofen. Patient denies any type of abdominal pain from the ibuprofen at this time she also has started working out with some swimming exercises which she is enjoying.    Obesity  Wt Readings from Last 3 Encounters:  03/06/11 269 lb (122.018 kg)  12/16/10 269 lb (122.018 kg)  08/23/10 273 lb 6.4 oz (124.013 kg)   patient has been very stable with her weight continuing to watch her diet and now going to start exercising more but she is found a regimen that she enjoys. Patient has history of polycystic ovarian syndrome but no thyroid disease which has been checked recently.    Review of Systems Denies fevers chills nausea vomiting abdominal pain or shortness of breath out of the ordinary   Past medical history, social, surgical and family history all reviewed.   Objective:   Physical Exam General Appearance:    Alert, cooperative, no distress, appears stated age  Head:    Normocephalic, without obvious abnormality, atraumatic scalp shows improvement still mild exfoliation but no bleeding or open lesions as before.   Lungs:     Clear to auscultation bilaterally, respirations unlabored   Heart:    Regular rate and rhythm, S1 and S2 normal, no murmur, rub   or gallop  Abdomen:     Soft, non-tender, bowel sounds active all  four quadrants,    no masses, no organomegaly  Extremities:   Extremities normal, atraumatic, no cyanosis or edema patient does have some nail changes possibly psoriatic changes   Pulses:   2+ and symmetric all extremities  Skin:   Skin color, texture, turgor normal, no rashes or lesions minimal psoriatic plaque on right elbow otherwise no acute flares anywhere else on the skin   Neurologic:   CNII-XII intact, normal strength, sensation and reflexes    throughout      Assessment & Plan:

## 2011-03-06 NOTE — Assessment & Plan Note (Signed)
Patient has not made great strides but now has sinus exercise program that she enjoys. Discussed with patient that this is likely part of her arthritis and could be beneficial for her to lose weight. Patient will continue her exercise as well as her diet and is motivated to change.

## 2011-03-30 ENCOUNTER — Encounter: Payer: Self-pay | Admitting: Family Medicine

## 2011-03-30 ENCOUNTER — Ambulatory Visit (INDEPENDENT_AMBULATORY_CARE_PROVIDER_SITE_OTHER): Payer: 59 | Admitting: Family Medicine

## 2011-03-30 DIAGNOSIS — L408 Other psoriasis: Secondary | ICD-10-CM

## 2011-03-30 DIAGNOSIS — M129 Arthropathy, unspecified: Secondary | ICD-10-CM

## 2011-03-30 DIAGNOSIS — L409 Psoriasis, unspecified: Secondary | ICD-10-CM | POA: Insufficient documentation

## 2011-03-30 DIAGNOSIS — M1990 Unspecified osteoarthritis, unspecified site: Secondary | ICD-10-CM

## 2011-03-30 MED ORDER — TRAMADOL HCL 50 MG PO TBDP: 1.0000 | ORAL_TABLET | Freq: Three times a day (TID) | ORAL | Status: DC | PRN

## 2011-03-30 NOTE — Patient Instructions (Signed)
Patient given verbal instructions 

## 2011-03-30 NOTE — Assessment & Plan Note (Signed)
Patient skin seems to be improving to the Elavil we'll continue the same treatment at this time due to patient's arthritic type changes will consider psoriatic arthritis but will still but this is under arthritis

## 2011-03-30 NOTE — Assessment & Plan Note (Signed)
We will continue the Mobic because it seems to be helping we will give patient a prescription of tramadol to help her with her sleep she can take at night patient has no history of seizure disorders and is on no other serotonin medications at this time patient will followup as needed

## 2011-03-30 NOTE — Progress Notes (Signed)
  Subjective:    Patient ID: Tabitha [name], female    DOB: [birth year], 45 y.o.   MRN: [id number]  HPI Patient is here for followup on her psoriatic arthritis. Patient did have this diagnosed by a potential dermatologist. 2 severe to psoriasis but did not know if the arthritis is from the psoriasis. Patient though is doing the creams that was given to her which include Elidel and is improving significantly. Patient states the arthritis has been approximately the same taking the Mobic daily allows her to do all her activities of daily living as well as her work without any problems. Patient states the joints is seen to be the most of all this her right shoulder and her right knee. But is not stopping her from anything specific patient states that the pain seems to be more at night to keep her up but she has been having some trouble with sleeping overall.   Review of Systems Denies fevers chills shortness of breath or chest pain Past medical surgical and social history reviewed with no changes    Objective:   Physical Exam  Gen: NAD obese.  Right shoulder-  Pt has decrease AROM but full PROM.  Pt has neg Hawkins, and Neg Neers, mild pain on apprehension, neg empty can test tender to palpation over posterior rhomboid and supraspinatus bursae feels mildly boggy and inflamed. Patient though does have a positive O'Brien time CV: RRR no mumur Pul: CTAb Skin: No signs of inflammation no psoriatic plaques seen except on right lower thigh patient's nails have significant improved since last time with only some mild splinting occurring on 2 nails on the right side and one on the left    Assessment & Plan:

## 2011-06-16 ENCOUNTER — Encounter: Payer: Self-pay | Admitting: Family Medicine

## 2011-06-16 ENCOUNTER — Other Ambulatory Visit: Payer: 59

## 2011-06-16 ENCOUNTER — Ambulatory Visit (INDEPENDENT_AMBULATORY_CARE_PROVIDER_SITE_OTHER): Payer: 59 | Admitting: Family Medicine

## 2011-06-16 VITALS — BP 107/71 | HR 94 | Temp 97.7°F | Ht 61.5 in | Wt 255.0 lb

## 2011-06-16 DIAGNOSIS — R197 Diarrhea, unspecified: Secondary | ICD-10-CM

## 2011-06-16 MED ORDER — CIPROFLOXACIN HCL 500 MG PO TABS: 500.0000 mg | ORAL_TABLET | Freq: Two times a day (BID) | ORAL | Status: DC

## 2011-06-16 MED ORDER — METRONIDAZOLE 500 MG PO TABS: 500.0000 mg | ORAL_TABLET | Freq: Three times a day (TID) | ORAL | Status: DC

## 2011-06-16 NOTE — Patient Instructions (Signed)
I am sending in some stool studies for you to check for bacteria and C. Difficile. Please take the Cipro and Flagyl for 5 days. Please call if you are getting worse Please come back and see [place] in a week for [place] to check on you.

## 2011-06-16 NOTE — Progress Notes (Signed)
  Subjective:    Patient ID: Tabitha [name], female    DOB: 1965-11-25, 46 y.o.   MRN: [id number]  HPI Reports upset stomach since Christmas Day. She has had diarrhea since the first of the year. She reports going to the bathroom 10-12 times per day. She feels crampy before she goes but she's not having pain in between. She is getting up to go 4-6 times per night. She describes the stool as watery and mucousy and smelling foul. She's not had any blood in her stool. She has not traveled recently and she's not eating any new foods. She is eating a bland diet. She has not changed any of her medicines. She is not taking her metformin right now. She has not had any fevers. She is not having nausea or vomiting.   Review of Systems     Objective:   Physical Exam Vital signs reviewed General appearance - alert, well appearing, and in no distress and oriented to person, place, and time Heart - normal rate, regular rhythm, normal S1, S2, no murmurs, rubs, clicks or gallops Chest - clear to auscultation, no wheezes, rales or rhonchi, symmetric air entry, no tachypnea, retractions or cyanosis Abdomen - soft, nontender, nondistended, no masses or organomegaly Rectal-no hard stool in vault. No evidence of fissure. There is some skin breakdown around the rectum on the outside. Hemoccult was positive    Assessment & Plan:

## 2011-06-16 NOTE — Progress Notes (Signed)
Stool samples dropped off  Tabitha [name] 

## 2011-06-16 NOTE — Assessment & Plan Note (Signed)
Several days of diarrhea worrisome for infectious causes. Will check stool culture, fecal lactoferrin. Her fecal occult blood was positive. We started Cipro and Flagyl today. Precepted with Dr. Jacquelyne Balint.

## 2011-06-17 LAB — FECAL LACTOFERRIN, QUANT: Lactoferrin: POSITIVE

## 2011-06-20 LAB — STOOL CULTURE

## 2011-06-27 ENCOUNTER — Ambulatory Visit (INDEPENDENT_AMBULATORY_CARE_PROVIDER_SITE_OTHER): Payer: 59 | Admitting: Family Medicine

## 2011-06-27 ENCOUNTER — Encounter: Payer: Self-pay | Admitting: Family Medicine

## 2011-06-27 VITALS — BP 120/79 | HR 93 | Temp 99.0°F | Ht 61.5 in | Wt 255.0 lb

## 2011-06-27 DIAGNOSIS — R197 Diarrhea, unspecified: Secondary | ICD-10-CM

## 2011-06-27 DIAGNOSIS — Z635 Disruption of family by separation and divorce: Secondary | ICD-10-CM | POA: Insufficient documentation

## 2011-06-27 MED ORDER — CIPROFLOXACIN HCL 500 MG PO TABS: 500.0000 mg | ORAL_TABLET | Freq: Two times a day (BID) | ORAL | Status: AC

## 2011-06-27 MED ORDER — METRONIDAZOLE 500 MG PO TABS: 500.0000 mg | ORAL_TABLET | Freq: Three times a day (TID) | ORAL | Status: AC

## 2011-06-27 NOTE — Patient Instructions (Signed)
Patient given verbal instructions 

## 2011-06-27 NOTE — Assessment & Plan Note (Signed)
At this time seemed to increase duration of treatment and we'll continue it for another 7 days. This will give her a total of 2 weeks. Told patient though that the differential including this was patient's arthritis in potential skin rashes previously makes one concerned for irritable bowel disease such as Crohn's disease, ulcerative colitis, or celiac disease. At this point patient would like to hold on any type of workup thinking that this is likely more infectious etiology. Patient does have a history of psoriasis as well and this might be concerning today's systemic. We'll continue to monitor if patient has another flare I would really consider doing a GI consult at that time. Patient will return as needed.

## 2011-06-27 NOTE — Assessment & Plan Note (Signed)
Patient separated from her husband recently. They are living apart at this time. Not having a lot of contact patient is going to go see a psychiatrist that she is to see long time ago. She states that she had a good relationship and declined any other names were references. Told patient I won't see her in willing to help if anything else is necessary. Give her red flags and when to seek medical attention otherwise if for some reason depression did set him. Patient though seems very well and seems very grounded at this time.

## 2011-06-27 NOTE — Progress Notes (Signed)
  Subjective:    Patient ID: Tabitha [name], female    DOB: [birth year], 46 y.o.   MRN: [id number]  HPI 46 year old female who is here for followup of diarrhea. Patient had diarrhea saw Dr. Hulen Luster at that time was given ciprofloxacin and Flagyl secondary to the potential of patient having diverticulitis or other type of gastrointestinal infection. Patient's lactoferrin was positive and did have a Hemoccult that was positive as well. Patient since that time has been feeling better but is still having one to 2 episodes of loose stool. Patient denies any melena denies any type of fever, chills, or significant abdominal pain. Patient though does not feel like herself still and is concerned that the treatment was not quite long enough.  In addition this patient has been separated from her husband over the last week. There is a potential that he'll be getting a divorce in the near future. Patient states that it is having she's having a little bit trouble with the transition but overall actually feels like away has been lifted off of her some she's not taking care of him as much. Patient states that he did have some alcoholic tendencies and did not seem to be trying on their relationship. Patient is sleeping well denies any suicidal or homicidal describe some time to time but overall still able to do all her activities of daily living without any problems.   Review of Systems As stated above    Objective:   Physical Exam Vital signs reviewed  General appearance - alert, well appearing, and in no distress and oriented to person, place, and time Heart - normal rate, regular rhythm, normal S1, S2, no murmurs, rubs, clicks or gallops Chest - clear to auscultation, no wheezes, rales or rhonchi, symmetric air entry, no tachypnea, retractions or cyanosis Abdomen - soft, nontender, nondistended, no masses or organomegaly    Assessment & Plan:

## 2011-09-06 ENCOUNTER — Ambulatory Visit (INDEPENDENT_AMBULATORY_CARE_PROVIDER_SITE_OTHER): Payer: 59 | Admitting: Family Medicine

## 2011-09-06 ENCOUNTER — Encounter: Payer: Self-pay | Admitting: Family Medicine

## 2011-09-06 VITALS — BP 131/87 | HR 94 | Temp 98.5°F | Ht 61.5 in | Wt 252.0 lb

## 2011-09-06 DIAGNOSIS — M999 Biomechanical lesion, unspecified: Secondary | ICD-10-CM

## 2011-09-06 DIAGNOSIS — R7309 Other abnormal glucose: Secondary | ICD-10-CM

## 2011-09-06 DIAGNOSIS — M25569 Pain in unspecified knee: Secondary | ICD-10-CM

## 2011-09-06 DIAGNOSIS — E7800 Pure hypercholesterolemia, unspecified: Secondary | ICD-10-CM

## 2011-09-06 DIAGNOSIS — M545 Low back pain, unspecified: Secondary | ICD-10-CM

## 2011-09-06 DIAGNOSIS — M549 Dorsalgia, unspecified: Secondary | ICD-10-CM

## 2011-09-06 DIAGNOSIS — M25561 Pain in right knee: Secondary | ICD-10-CM

## 2011-09-06 DIAGNOSIS — M5459 Other low back pain: Secondary | ICD-10-CM

## 2011-09-06 DIAGNOSIS — Z635 Disruption of family by separation and divorce: Secondary | ICD-10-CM

## 2011-09-06 DIAGNOSIS — M25562 Pain in left knee: Secondary | ICD-10-CM

## 2011-09-06 DIAGNOSIS — E669 Obesity, unspecified: Secondary | ICD-10-CM

## 2011-09-06 DIAGNOSIS — R739 Hyperglycemia, unspecified: Secondary | ICD-10-CM

## 2011-09-06 LAB — COMPREHENSIVE METABOLIC PANEL
ALT: 48 U/L — ABNORMAL HIGH (ref 0–35)
AST: 26 U/L (ref 0–37)
Albumin: 4 g/dL (ref 3.5–5.2)
Alkaline Phosphatase: 87 U/L (ref 39–117)
BUN: 11 mg/dL (ref 6–23)
CO2: 28 mEq/L (ref 19–32)
Calcium: 9.4 mg/dL (ref 8.4–10.5)
Chloride: 101 mEq/L (ref 96–112)
Creat: 0.71 mg/dL (ref 0.50–1.10)
Glucose, Bld: 155 mg/dL — ABNORMAL HIGH (ref 70–99)
Potassium: 4.2 mEq/L (ref 3.5–5.3)
Sodium: 138 mEq/L (ref 135–145)
Total Bilirubin: 0.7 mg/dL (ref 0.3–1.2)
Total Protein: 7.3 g/dL (ref 6.0–8.3)

## 2011-09-06 LAB — LIPID PANEL
Cholesterol: 222 mg/dL — ABNORMAL HIGH (ref 0–200)
HDL: 59 mg/dL (ref >39)
LDL Cholesterol: 144 mg/dL — ABNORMAL HIGH (ref 0–99)
Total CHOL/HDL Ratio: 3.8 Ratio
Triglycerides: 97 mg/dL (ref <150)
VLDL: 19 mg/dL (ref 0–40)

## 2011-09-06 LAB — POCT GLYCOSYLATED HEMOGLOBIN (HGB A1C): Hemoglobin A1C: 7.2

## 2011-09-06 MED ORDER — METFORMIN HCL 1000 MG PO TABS: 1000.0000 mg | ORAL_TABLET | Freq: Two times a day (BID) | ORAL | Status: DC

## 2011-09-06 MED ORDER — DIAZEPAM 5 MG PO TABS: 5.0000 mg | ORAL_TABLET | Freq: Two times a day (BID) | ORAL | Status: AC | PRN

## 2011-09-06 MED ORDER — KETOROLAC TROMETHAMINE 60 MG/2ML IM SOLN
60.0000 mg | Freq: Once | INTRAMUSCULAR | Status: AC
  Administered 2011-09-06: 60 mg via INTRAMUSCULAR

## 2011-09-06 NOTE — Assessment & Plan Note (Signed)
Lab Results  Component Value Date   CHOL 204* 05/10/2009   HDL 59 05/10/2009   LDLCALC 121* 05/10/2009   LDLDIRECT 110* 07/28/2010   TRIG 118 05/10/2009   CHOLHDL 3.5 Ratio 05/10/2009   Will recheck today. Patient did attempt simvastatin before previously and had an elevated liver enzymes. We'll need to adjust treatment accordingly.

## 2011-09-06 NOTE — Assessment & Plan Note (Signed)
With patient's history is concerned of early diabetes. We will get an A1c to see a 3 month average. In addition of the stone already increase her metformin 2000 mg twice a day and see if patient can do well and tolerated

## 2011-09-06 NOTE — Assessment & Plan Note (Signed)
Decision today to treat with OMT was based on Physical Exam  After verbal consent patient was treated with HVLA and muscle energy techniques in lumbar and sacral areas  Patient tolerated the procedure well with improvement in symptoms  Patient given exercises, stretches and lifestyle modifications  See medications in patient instructions if given  Patient will follow up in 3 weeks

## 2011-09-06 NOTE — Progress Notes (Signed)
  Subjective:    Patient ID: Tabitha [name], female    DOB: 1966-02-21, 46 y.o.   MRN: [id number]  HPI  1. knee pain-patient is right-sided knee pain has had this for some time, last time seen was concerned for potential patellofemoral syndrome. Patient has been doing some of the exercises with minimal improvement. Patient has not been religious in doing these exercises. Patient is also not been able to lose any weight which is contributing to the pain. Patient does feel that her kneecap does seem to cry and from time to time which seems to be the main pain. Patient states most pain is on the lateral aspect of the knee no radiation no swelling no numbness in extremities.  2. Psoriasis-patient states that it is improving, states that the cream as well as her over-the-counter topical homeopathic regimen is helping.  3. Back pain-patient states it's been about 2 days mostly right-sided lumbar area no radiation denies any weakness of the leg, denies any fevers chills dysuria or any change in bowel habits. Patient was able to work on Monday but since then has been having too much pain to work.  4. Hyperglycemia-patient has had a history of this previously when looking at the labs. Patient has never had an A1c before. Patient does have a meter at home and has been as high as 200 when she was not taking her metformin regularly. Patient started her metformin for the last week and now in the 130s. Denies any polyuria polydipsia or any changes in vision. Patient was originally started on metformin secondary to her polycystic ovarian syndrome.  5.  marriage separation-patient is still living with her husband not legally divorced at this time. Patient though states she is separated from him not being intimate within at all. Patient feels safe at home denies any abuse.  Review of Systems As stated in history of present illness    Objective:   Physical Exam General: No apparent distress obese female CV: Regular  rate and rhythm no murmur Pulmonary: Clear to auscultation bilaterally Abdomen: Bowel sounds positive nontender Knee: Normal to inspection with no erythema or effusion or obvious bony abnormalities. Palpation normal with no warmth or joint line tenderness but + patellar tenderness ROM normal in flexion and extension and lower leg rotation. Ligaments with solid consistent endpoints including ACL, PCL, LCL, MCL. Negative Mcmurray's and provocative meniscal tests. Painful patellar compression. Patient does track laterally of knee cap. Patellar and quadriceps tendons unremarkable. Hamstring and quadriceps strength is normal. Back exam: Patient has severe paraspinal tenderness and muscle spasm on the right side from T11-L4, negative Faber negative straight leg test  OMT Physical Exam  Standing structural       Occiput left lower  Shoulder normal  Inferior angle of scapula right lower  Illiac crest right lower  Medial malleolus right higher  Standing flexion right  Seated Flexion right  Cervical none  Thoracic T4 extended rotated and side bent left, T7 extended rotated and side bent right  Lumbar L1-4 rotated right side bent left  Sacrum left on left  Illium right anterior ilium       Assessment & Plan:

## 2011-09-06 NOTE — Patient Instructions (Signed)
It is good to see you. I will get labs and I will call you with the results. I am giving you a prescription of Valium to have on hand in case your back hurts for the next 5 days. I am increasing her metformin to 1000 mg twice a day. He should come back in 3-4 weeks to reevaluate her back as well as make sure your sugars are doing well.

## 2011-09-06 NOTE — Assessment & Plan Note (Signed)
Discussed with patient at length, patient is still living with her husband and feels safe at home I'm here for support if necessary.

## 2011-09-06 NOTE — Assessment & Plan Note (Signed)
Encourage patient to lose weight continue her exercise program continue to watch her diet. We'll also increase her metformin 1000 iron twice a day which will help.

## 2011-09-21 ENCOUNTER — Ambulatory Visit: Payer: 59 | Admitting: Family Medicine

## 2011-10-03 ENCOUNTER — Encounter: Payer: Self-pay | Admitting: Family Medicine

## 2011-10-03 ENCOUNTER — Ambulatory Visit (INDEPENDENT_AMBULATORY_CARE_PROVIDER_SITE_OTHER): Payer: 59 | Admitting: Family Medicine

## 2011-10-03 VITALS — BP 115/75 | HR 82 | Temp 98.3°F | Ht 61.5 in | Wt 247.0 lb

## 2011-10-03 DIAGNOSIS — M25561 Pain in right knee: Secondary | ICD-10-CM

## 2011-10-03 DIAGNOSIS — E1369 Other specified diabetes mellitus with other specified complication: Secondary | ICD-10-CM

## 2011-10-03 DIAGNOSIS — M2140 Flat foot [pes planus] (acquired), unspecified foot: Secondary | ICD-10-CM

## 2011-10-03 DIAGNOSIS — M25569 Pain in unspecified knee: Secondary | ICD-10-CM

## 2011-10-03 DIAGNOSIS — M999 Biomechanical lesion, unspecified: Secondary | ICD-10-CM

## 2011-10-03 DIAGNOSIS — M25562 Pain in left knee: Secondary | ICD-10-CM | POA: Insufficient documentation

## 2011-10-03 DIAGNOSIS — E0865 Diabetes mellitus due to underlying condition with hyperglycemia: Secondary | ICD-10-CM

## 2011-10-03 NOTE — Assessment & Plan Note (Signed)
Knee pain does not seem to be improving we will get x-rays to rule out any type of internal derangement of the knee. Procedure note After verbal and written consent given pt was prepped with betadine.  1:3 kenalog 40 to lidocaine used in knees bilaterally.  Pt minimal bleeding dressed with band aid, Pt given red flags to look for pt had better pain control immediatly.    Will also treat patient's pes planus bilaterally

## 2011-10-03 NOTE — Progress Notes (Signed)
  Subjective:    Patient ID: Tabitha [name], female    DOB: 01/12/1966, 46 y.o.   MRN: [id number]  HPI   1. F/u of  knee pain- used to be bilateral now right still worse than left. Patient has been trying to do exercises but not doing them as religiously as she should. Patient states though that the pain has gotten so bad in the right when she's had to call for work one time. Patient is finding it hard to go up or down stairs still having the pain mostly on the medial aspect and sometimes lateral aspect of the patella. Denies any back pain denies any locking or giving out on her. Denies any swelling or erythema.  2. F/u Back pain-patient states it's been about 2 days mostly right-sided lumbar area no radiation denies any weakness of the leg, denies any fevers chills dysuria or any change in bowel habits. Patient has been seen previously for same complaint manipulation has helped significantly. Patient is doing  some home exercises but not as religiously as she should.  3. Diabetes:  High at home:130  Low at home:106 Taking medications: Yes metformin 1000 mg twice a day. Side effects: No ROS: denies fever, chills, dizziness, loss of conscieness, polyuria poly dipsia numbness or tingling in extremities or chest pain. Last A1c was 7.2 and we did increase her metformin to 1000 mg twice a day.    Review of Systems  As stated in history of present illness    Objective:   Physical Exam  vitals reviewed General: No apparent distress obese female CV: Regular rate and rhythm no murmur Pulmonary: Clear to auscultation bilaterally Abdomen: Bowel sounds positive nontender Knee: Normal to inspection with no erythema or effusion or obvious bony abnormalities. Palpation normal with no warmth or joint line tenderness but + patellar tenderness ROM normal in flexion and extension and lower leg rotation. Ligaments with solid consistent endpoints including ACL, PCL, LCL, MCL. Negative Mcmurray's and  provocative meniscal tests. Painful patellar compression. Patient does track laterally of knee cap. Patellar and quadriceps tendons unremarkable. Hamstring and quadriceps strength is normal. Back exam: Patient has severe paraspinal tenderness and muscle spasm on the right side from T11-L4, negative Faber negative straight leg test Foot exam shows pes planus bilaterally with overpronation bilaterally. Patient also has collapse of the transverse arch. OMT Physical Exam  Standing structural       Occiput left lower  Shoulder normal  Inferior angle of scapula right lower  Illiac crest right lower  Medial malleolus right higher  Standing flexion right  Seated Flexion right  Cervical none  Thoracic T4 extended rotated and side bent left, T7 extended rotated and side bent right  Lumbar L1-4 rotated right side bent left  Sacrum left on left  Illium right anterior ilium    Assessment & Plan:

## 2011-10-03 NOTE — Assessment & Plan Note (Signed)
Decision today to treat with OMT was based on Physical Exam  After verbal consent patient was treated with HVLA and articular techniques in lumbosacral areas  Patient tolerated the procedure well with improvement in symptoms  Patient given exercises, stretches and lifestyle modifications  See medications in patient instructions if given  Patient will follow up in 2-3 weeks

## 2011-10-03 NOTE — Assessment & Plan Note (Signed)
Seems to be improving somewhat. Patient is due for an A1c in July of 2013. Continue metformin diet and exercise.

## 2011-10-03 NOTE — Assessment & Plan Note (Signed)
Patient given arch bandages bilaterally as well as told to pick up over-the-counter orthotics. If does not improve would consider custom orthotics.

## 2011-10-03 NOTE — Patient Instructions (Signed)
It is good to see you. I'm giving you some arch supports for your feet. Tried to wear them daily. I when she to go get [employer]en[employer] orthotics, you can pick these up at [employer] [facility] or online. I injected your knees today and we will get x-rays. Come back in 2-3 weeks or call me if not better and we will try to get too in with sports medicine for ultrasound.

## 2011-10-16 ENCOUNTER — Other Ambulatory Visit: Payer: Self-pay | Admitting: Family Medicine

## 2011-10-26 ENCOUNTER — Ambulatory Visit (INDEPENDENT_AMBULATORY_CARE_PROVIDER_SITE_OTHER): Payer: 59 | Admitting: Family Medicine

## 2011-10-26 ENCOUNTER — Encounter: Payer: Self-pay | Admitting: Family Medicine

## 2011-10-26 VITALS — BP 132/83 | HR 95 | Temp 100.1°F | Ht 61.5 in | Wt 238.6 lb

## 2011-10-26 DIAGNOSIS — R197 Diarrhea, unspecified: Secondary | ICD-10-CM

## 2011-10-26 MED ORDER — CIPROFLOXACIN HCL 500 MG PO TABS: 500.0000 mg | ORAL_TABLET | Freq: Two times a day (BID) | ORAL | Status: AC

## 2011-10-26 MED ORDER — METRONIDAZOLE 500 MG PO TABS: 500.0000 mg | ORAL_TABLET | Freq: Three times a day (TID) | ORAL | Status: AC

## 2011-10-26 NOTE — Progress Notes (Signed)
  Subjective:    Patient ID: Tabitha [name], female    DOB: 1966-02-27, 46 y.o.   MRN: [id number]  HPIWork in for Diarrhea  Started 10 days ago  10-15 stools per day, watery Bright red blood Mild abdominal pain or cramping with stools, but otherwise comfortable Onset after meal of spaghetti after she had been on a gluten free diet- no other household members sick No recent travel (but has been in hospital with mother in law and works at [employer]) Fever up to 101 for past week In January, had similar episode, did 3 weeks of cipro which seemed to help it.   I have reviewed patient's  PMH, FH, and Social history and Medications as related to this visit. Back in 2006 she had evaluation for similar type episodes. as well.  no endoscopy or colonoscopyReview of Systems     Objective:   Physical Exam GEN: Alert & Oriented, No acute distress CV:  Regular Rate & Rhythm, no murmur Respiratory:  Normal work of breathing, CTAB Abd:  + BS, soft, no tenderness to palpation Ext: no pre-tibial edema        Assessment & Plan:

## 2011-10-26 NOTE — Patient Instructions (Signed)
Cipro and flagyl for diarrhea x 7 days Collect stool culture before antibitoics Follow-up with Dr. Katrinka Blazing as scheduled

## 2011-10-26 NOTE — Assessment & Plan Note (Signed)
Acute diarrhea x 10 days.  Profuse and watery with blood and mucous, minimal pain.  Will bring back stool sample for blood, fecal lactoferrin, stool culture and stool c. Diff.  I suspect these will be normal as in past. Will check CBC with diff and CMET today.  Now recurrent, no evidence of diverticulitis when evaluated in 2006 by CT abdomen.  Will treat empirically with 7 days of cipro/flagyl. Has follow-up with PCP in 5 days.  I recommended GI follow-up to evaluate for inflammatory bowel disease, she will speak with her PCP about this in further detail.

## 2011-10-27 LAB — COMPREHENSIVE METABOLIC PANEL
ALT: 25 U/L (ref 0–35)
AST: 17 U/L (ref 0–37)
Albumin: 3.7 g/dL (ref 3.5–5.2)
Alkaline Phosphatase: 70 U/L (ref 39–117)
BUN: 5 mg/dL — ABNORMAL LOW (ref 6–23)
CO2: 27 mEq/L (ref 19–32)
Calcium: 9.4 mg/dL (ref 8.4–10.5)
Chloride: 102 mEq/L (ref 96–112)
Creat: 0.62 mg/dL (ref 0.50–1.10)
Glucose, Bld: 116 mg/dL — ABNORMAL HIGH (ref 70–99)
Potassium: 4.4 mEq/L (ref 3.5–5.3)
Sodium: 139 mEq/L (ref 135–145)
Total Bilirubin: 0.5 mg/dL (ref 0.3–1.2)
Total Protein: 6.9 g/dL (ref 6.0–8.3)

## 2011-10-27 LAB — CBC WITH DIFFERENTIAL/PLATELET
Basophils Absolute: 0 10*3/uL (ref 0.0–0.1)
Basophils Relative: 0 % (ref 0–1)
Eosinophils Absolute: 0.2 10*3/uL (ref 0.0–0.7)
Eosinophils Relative: 2 % (ref 0–5)
HCT: 40.3 % (ref 36.0–46.0)
Hemoglobin: 12.9 g/dL (ref 12.0–15.0)
Lymphocytes Relative: 17 % (ref 12–46)
Lymphs Abs: 1.9 10*3/uL (ref 0.7–4.0)
MCH: 29.7 pg (ref 26.0–34.0)
MCHC: 32 g/dL (ref 30.0–36.0)
MCV: 92.9 fL (ref 78.0–100.0)
Monocytes Absolute: 1.2 10*3/uL — ABNORMAL HIGH (ref 0.1–1.0)
Monocytes Relative: 11 % (ref 3–12)
Neutro Abs: 7.8 10*3/uL — ABNORMAL HIGH (ref 1.7–7.7)
Neutrophils Relative %: 70 % (ref 43–77)
Platelets: 272 10*3/uL (ref 150–400)
RBC: 4.34 MIL/uL (ref 3.87–5.11)
RDW: 13.1 % (ref 11.5–15.5)
WBC: 11.1 10*3/uL — ABNORMAL HIGH (ref 4.0–10.5)

## 2011-10-27 NOTE — Progress Notes (Signed)
Pt returned stool samples.  Sent to [employer], [facility]

## 2011-10-27 NOTE — Progress Notes (Signed)
Addended by: [place], Donnamarie Shankles on: 10/27/2011 12:40 PM   Modules accepted: Orders

## 2011-10-27 NOTE — Progress Notes (Signed)
Addended by: Aletha Halim A on: 10/27/2011 02:11 PM   Modules accepted: Orders

## 2011-10-28 LAB — FECAL LACTOFERRIN, QUANT: Lactoferrin: POSITIVE

## 2011-10-28 LAB — FECAL OCCULT BLOOD, IMMUNOCHEMICAL: Fecal Occult Blood: POSITIVE — AB

## 2011-10-30 LAB — CLOSTRIDIUM DIFFICILE BY PCR: Toxigenic C. Difficile by PCR: NOT DETECTED

## 2011-10-31 ENCOUNTER — Encounter: Payer: Self-pay | Admitting: Family Medicine

## 2011-10-31 ENCOUNTER — Ambulatory Visit (INDEPENDENT_AMBULATORY_CARE_PROVIDER_SITE_OTHER): Payer: 59 | Admitting: Family Medicine

## 2011-10-31 VITALS — BP 122/78 | HR 88 | Temp 98.8°F | Ht 61.5 in | Wt 241.0 lb

## 2011-10-31 DIAGNOSIS — M1990 Unspecified osteoarthritis, unspecified site: Secondary | ICD-10-CM

## 2011-10-31 DIAGNOSIS — R197 Diarrhea, unspecified: Secondary | ICD-10-CM

## 2011-10-31 DIAGNOSIS — E119 Type 2 diabetes mellitus without complications: Secondary | ICD-10-CM

## 2011-10-31 DIAGNOSIS — M129 Arthropathy, unspecified: Secondary | ICD-10-CM

## 2011-10-31 DIAGNOSIS — E669 Obesity, unspecified: Secondary | ICD-10-CM

## 2011-10-31 LAB — STOOL CULTURE

## 2011-10-31 NOTE — Progress Notes (Signed)
  Subjective:    Patient ID: Tabitha [name], female    DOB: [birth year], 46 y.o.   MRN: [id number]  HPI 46 year old female who is here for followup of diarrhea and abdominal pain. Patient states that she's feeling much better. Patient has been taking the Cipro Flagyl religiously. Patient's stool culture that came out growing out Escherichia coli 0157 which should not be treated with antibiotics. Patient states that the abdominal pain much better her stool as well formed no other problems at this point.  Obesity-patient is loss now 31 pounds in the course of one year. Patient is just made lifestyle changes like increasing activity and watching her diet closely. Patient is due to come back in one month for an A1c.  Psoriasis/arthritis-patient is continuing to have flares of her arthritis. Last week she was in so much pain and cannot do any of her regular activities of daily living. Patient has these flares intermittently usually 2-3 months and is having trouble when she has these pains. At this point patient has improved but due to his chronic problems she would like to potentially be referred to a rheumatologist. Review of Systems Denies fever, chills, nausea vomiting abdominal pain, dysuria, chest pain, shortness of breath dyspnea on exertion or numbness in extremities     Objective:   Physical Exam General: No apparent distress Skin: Patient does have signs of her psoriasis on elbows and knees. Cardiovascular: Regular in rhythm no murmur Abdomen: Bowel sounds positive nontender nondistended no organomegaly.      Assessment & Plan:

## 2011-10-31 NOTE — Patient Instructions (Signed)
Verbal instructions given

## 2011-10-31 NOTE — Assessment & Plan Note (Signed)
Patient continues to have flares, takes meloxicam daily. We'll send patient to the rheumatologist for evaluation for possible psoriatic arthritis and further treatment options.

## 2011-10-31 NOTE — Assessment & Plan Note (Signed)
Resolved at this time. Discussed stopping the Cipro and Flagyl secondary to it being an Escherichia coli. Gave patient red flags and when to seek medical attention.

## 2011-10-31 NOTE — Assessment & Plan Note (Signed)
Patient is loss significant amount of weight over the course of a year. Encourage her to continue this lifestyle change. Patient was already to pick at goal weight at this time.

## 2011-11-16 ENCOUNTER — Telehealth: Payer: Self-pay | Admitting: Family Medicine

## 2011-11-16 DIAGNOSIS — R197 Diarrhea, unspecified: Secondary | ICD-10-CM

## 2011-11-16 NOTE — Telephone Encounter (Signed)
Called patient, received new culture result showing that patient did not have Ecoli infection.  Will send in referral for GI.  She has seen [facility] before.

## 2011-12-06 ENCOUNTER — Encounter: Payer: Self-pay | Admitting: Family Medicine

## 2011-12-06 ENCOUNTER — Ambulatory Visit (INDEPENDENT_AMBULATORY_CARE_PROVIDER_SITE_OTHER): Payer: 59 | Admitting: Family Medicine

## 2011-12-06 VITALS — BP 144/93 | HR 81 | Temp 98.0°F | Ht 61.5 in | Wt 240.1 lb

## 2011-12-06 DIAGNOSIS — M129 Arthropathy, unspecified: Secondary | ICD-10-CM

## 2011-12-06 DIAGNOSIS — M1990 Unspecified osteoarthritis, unspecified site: Secondary | ICD-10-CM

## 2011-12-06 DIAGNOSIS — E7800 Pure hypercholesterolemia, unspecified: Secondary | ICD-10-CM

## 2011-12-06 DIAGNOSIS — M545 Low back pain, unspecified: Secondary | ICD-10-CM

## 2011-12-06 DIAGNOSIS — E0865 Diabetes mellitus due to underlying condition with hyperglycemia: Secondary | ICD-10-CM

## 2011-12-06 DIAGNOSIS — E1369 Other specified diabetes mellitus with other specified complication: Secondary | ICD-10-CM

## 2011-12-06 DIAGNOSIS — M5459 Other low back pain: Secondary | ICD-10-CM

## 2011-12-06 DIAGNOSIS — E669 Obesity, unspecified: Secondary | ICD-10-CM

## 2011-12-06 LAB — POCT GLYCOSYLATED HEMOGLOBIN (HGB A1C): Hemoglobin A1C: 6.1

## 2011-12-06 MED ORDER — OXYCODONE-ACETAMINOPHEN 5-325 MG PO TABS: 1.0000 | ORAL_TABLET | Freq: Three times a day (TID) | ORAL | Status: AC | PRN

## 2011-12-06 MED ORDER — OXYCODONE-ACETAMINOPHEN 5-325 MG PO TABS: 1.0000 | ORAL_TABLET | Freq: Three times a day (TID) | ORAL | Status: DC | PRN

## 2011-12-07 ENCOUNTER — Telehealth: Payer: Self-pay | Admitting: Family Medicine

## 2011-12-07 LAB — LDL CHOLESTEROL, DIRECT: Direct LDL: 149 mg/dL — ABNORMAL HIGH

## 2011-12-07 MED ORDER — PNEUMOCOCCAL VAC POLYVALENT 25 MCG/0.5ML IJ INJ: 0.5000 mL | INJECTION | Freq: Once | INTRAMUSCULAR | Status: DC

## 2011-12-07 NOTE — Assessment & Plan Note (Signed)
Patient is made drastic improvement. Patient will continue the metformin thousand milligrams twice a day did not need any further changes. Encourage patient continue weight loss and continue exercising. Continue to check A1c is every 3-4 months.

## 2011-12-07 NOTE — Assessment & Plan Note (Signed)
Responds well to exercises as well as osteopathic manipulative therapy. Encourage patient to do also continue to lose weight which has been beneficial. Patient's some aches and pains are secondary to her psoriatic arthritis as well.

## 2011-12-07 NOTE — Progress Notes (Signed)
Patient ID: Tabitha [name], female   DOB: 12-May-1966, 46 y.o.   MRN: [id number]  Subjective:    Patient ID: Tabitha [name], female    DOB: 31-Dec-1965, 46 y.o.   MRN: [id number]  Diabetes   1. Diabetes:  High at home:125  Low at home:95 Taking medications: Yes metformin 1000 mg twice a day. Side effects: No ROS: denies fever, chills, dizziness, loss of conscieness, polyuria poly dipsia numbness or tingling in extremities or chest pain. Patient increase metformin thousand milligrams twice a day since last visit the last A1c checked which was 7.2 today A1c is  Lab Results  Component Value Date   HGBA1C 6.1 12/06/2011   Patient is continuing to lose weight as well feeling better with most of her aches and pains seem to be improving as well. Patient has done this with diet and exercises and is encouraged to continue doing it all the same. Patient has lost 30 pounds within the course of the last year.  Back pain-patient is a labor and delivery nurse and with her chronic lifting and weird positions patient does have some lower lumbar pain from time to time. Patient states she's been doing diet and exercise which is help tremendously with a weight loss but still has some soreness. Patient has had osteopathic manipulation medicine done before and would like to have it done again today. Patient denies any radiation down the legs denies any bowel or bladder incontinence denies any fevers or chills.  Patient also has a history of psoriasis was seen by rheumatology recently due to all her chronic aches and pains of her joints. Patient appears to be diagnosed with psoriatic arthritis now as well. Patient likely will be placed on an immune modulator such as methotrexate or Plaquenil in the near future. Patient has mixed emotions about this. Patient still is awaiting significant amount of lab values that were drawn.     The patient was also seen recently by gastroenterology and is scheduled for an endoscopy as well as  a colonoscopy secondary to patient having a chronic diarrhea. Patient has been treated for diverticulitis as well as discontinued on her meloxicam which could have been contributing. Agent though states that these have not improved significantly during the course of that Review of Systems As stated in history of present illness    Objective:   Physical Exam vitals reviewed General: No apparent distress obese female CV: Regular rate and rhythm no murmur Pulmonary: Clear to auscultation bilaterally Abdomen: Bowel sounds positive nontender  omt exam  Standing flexion right  Seated Flexion right  Cervical none  Thoracic T4 extended rotated and side bent left, T7 extended rotated and side bent right  Lumbar L1-4 rotated right side bent left  Sacrum left on left  Illium right anterior ilium    Assessment & Plan:

## 2011-12-07 NOTE — Telephone Encounter (Signed)
Discussed lab results with patient at this time encourage her to try potentially some red yeast rice over-the-counter to see if we can bring down her LDL. Patient does have a history of not being able to take statins due to elevated liver enzymes.  Patient also will be given Pneumovax sent in to pharmacy for her to take her own. Patient will be starting methotrexate it sounds like in the near future from a rheumatologist.

## 2011-12-07 NOTE — Patient Instructions (Signed)
Patient given verbal instructions 

## 2011-12-07 NOTE — Assessment & Plan Note (Signed)
Patient has made remarkable strides encourage patient to continue to do the same. Patient's was unwilling to make a goal weight today but still feels she has a good 40 pounds to go hopefully within the next year.

## 2011-12-07 NOTE — Assessment & Plan Note (Signed)
Rheumatologist concurred that likely patient's arthritis is more of an inflammatory arthritis secondary to her psoriasis. Patient will likely be started on immune modulator. When reviewing the [place] preventative task force recommendations we will also give patient's a pneumo vaccine. Also gave patient a prescription oxycodone have on hand not the patient is unable to tolerate meloxicam secondary to patient's chronic diarrhea. We'll continue to monitor closely. Did not have patient sign a pain contract likely this will be his as needed medication and not refilled a regular basis.

## 2011-12-11 ENCOUNTER — Other Ambulatory Visit: Payer: Self-pay | Admitting: Rheumatology

## 2011-12-11 ENCOUNTER — Ambulatory Visit
Admission: RE | Admit: 2011-12-11 | Discharge: 2011-12-11 | Disposition: A | Discharge status: Home / Self Care | Payer: 59 | Source: Ambulatory Visit | Attending: Rheumatology | Admitting: Rheumatology

## 2011-12-11 DIAGNOSIS — M1990 Unspecified osteoarthritis, unspecified site: Secondary | ICD-10-CM

## 2012-01-17 ENCOUNTER — Encounter: Payer: Self-pay | Admitting: Family Medicine

## 2012-01-17 DIAGNOSIS — R933 Abnormal findings on diagnostic imaging of other parts of digestive tract: Secondary | ICD-10-CM | POA: Insufficient documentation

## 2012-03-12 ENCOUNTER — Ambulatory Visit (INDEPENDENT_AMBULATORY_CARE_PROVIDER_SITE_OTHER): Payer: Self-pay | Admitting: Pharmacist

## 2012-03-12 ENCOUNTER — Encounter: Payer: Self-pay | Admitting: Pharmacist

## 2012-03-12 VITALS — BP 140/77 | HR 75 | Ht 62.0 in | Wt 246.1 lb

## 2012-03-12 DIAGNOSIS — L408 Other psoriasis: Secondary | ICD-10-CM

## 2012-03-12 DIAGNOSIS — L4050 Arthropathic psoriasis, unspecified: Secondary | ICD-10-CM | POA: Insufficient documentation

## 2012-03-12 DIAGNOSIS — L409 Psoriasis, unspecified: Secondary | ICD-10-CM

## 2012-03-12 NOTE — Progress Notes (Signed)
  Subjective:    Patient ID: Tabitha [name], female    DOB: 05-31-1966, 46 y.o.   MRN: [id number]  HPI Patient arrives in good spirits for medication review.   Reports seeing Dr. Durene Cal (previously seen by Dr. Terrilee Files) as primary care provider, Dr. Corliss Skains as rheumatologist, OBGYN Dereck Ligas.  Reports being diagnosed with psoriatic arthritis since  Fall 2012 and states this is currently under an acceptable level of control.       Review of Systems     Objective:   Physical Exam        Assessment & Plan:  Medication review completed with patient with patient endorsing low blood counts when she was previously on 20 mg of methotrexate weekly. Following medication review, no suggestions for change. Patient admits to significant pain control improvement with Enbrel x 2 doses.  Complete medication list provided to patient.  Total time in face to face medication review: 20 minutes.  Patient seen with: Tiney Rouge, PharmD Candidate and Lillia Pauls, PharmD, Pharmacy Resident.

## 2012-03-12 NOTE — Assessment & Plan Note (Signed)
Medication review completed with patient with patient endorsing low blood counts when she was previously on 20 mg of methotrexate weekly. Following medication review, no suggestions for change. Patient admits to significant pain control improvement with Enbrel x 2 doses.  Complete medication list provided to patient.  Total time in face to face medication review: 20 minutes.  Patient seen with: Tiney Rouge, PharmD Candidate and Lillia Pauls, PharmD, Pharmacy Resident.

## 2012-03-13 NOTE — Progress Notes (Signed)
Patient ID: Tabitha [name], female   DOB: 02-16-1966, 46 y.o.   MRN: [id number] Reviewed and agree with Dr. Macky Lower management and documentation.

## 2012-05-16 ENCOUNTER — Other Ambulatory Visit (HOSPITAL_COMMUNITY): Payer: Self-pay | Admitting: Obstetrics and Gynecology

## 2012-05-16 DIAGNOSIS — Z1231 Encounter for screening mammogram for malignant neoplasm of breast: Secondary | ICD-10-CM

## 2012-05-28 ENCOUNTER — Ambulatory Visit (INDEPENDENT_AMBULATORY_CARE_PROVIDER_SITE_OTHER): Payer: 59 | Admitting: Internal Medicine

## 2012-05-28 ENCOUNTER — Encounter: Payer: Self-pay | Admitting: Internal Medicine

## 2012-05-28 VITALS — BP 124/84 | HR 86 | Temp 98.3°F | Resp 20 | Wt 263.0 lb

## 2012-05-28 DIAGNOSIS — E0865 Diabetes mellitus due to underlying condition with hyperglycemia: Secondary | ICD-10-CM

## 2012-05-28 DIAGNOSIS — L408 Other psoriasis: Secondary | ICD-10-CM

## 2012-05-28 DIAGNOSIS — E669 Obesity, unspecified: Secondary | ICD-10-CM

## 2012-05-28 DIAGNOSIS — E139 Other specified diabetes mellitus without complications: Secondary | ICD-10-CM

## 2012-05-28 DIAGNOSIS — R748 Abnormal levels of other serum enzymes: Secondary | ICD-10-CM

## 2012-05-28 DIAGNOSIS — R7402 Elevation of levels of lactic acid dehydrogenase (LDH): Secondary | ICD-10-CM

## 2012-05-28 DIAGNOSIS — E1369 Other specified diabetes mellitus with other specified complication: Secondary | ICD-10-CM

## 2012-05-28 DIAGNOSIS — R7401 Elevation of levels of liver transaminase levels: Secondary | ICD-10-CM

## 2012-05-28 DIAGNOSIS — Z800 Family history of malignant neoplasm of digestive organs: Secondary | ICD-10-CM | POA: Insufficient documentation

## 2012-05-28 DIAGNOSIS — Z803 Family history of malignant neoplasm of breast: Secondary | ICD-10-CM

## 2012-05-28 DIAGNOSIS — L409 Psoriasis, unspecified: Secondary | ICD-10-CM

## 2012-05-28 DIAGNOSIS — E119 Type 2 diabetes mellitus without complications: Secondary | ICD-10-CM

## 2012-05-28 DIAGNOSIS — E7800 Pure hypercholesterolemia, unspecified: Secondary | ICD-10-CM

## 2012-05-28 DIAGNOSIS — L4050 Arthropathic psoriasis, unspecified: Secondary | ICD-10-CM

## 2012-05-28 NOTE — Progress Notes (Signed)
Subjective:    Patient ID: Tabitha [name], female    DOB: [birth year], 46 y.o.   MRN: [id number]  HPI New pt here for first visit.  Former pt of [facility].  PMH of Type II diabetes,  Psoriasis complicated by psoriatic arthritis, Depression,  Hyperlipidemia,  PCOS,.  She also has FH of breast cancer in her mother at age 42 and FH of colon CA in grandparents.  Pt works in L and [employer] at [employer] and is owner of [employer]  Overall doing well.  She will need her Metformin and she reports her last AIC was over 7%.  Diagnosed with Diabetes 9 months ago.  She is not on Ace I as yet reports she would like to have her psoriatic arthritis stable before any new meds.  She will have a CMP done this week with Dr. Corliss Skains  She has recently started Enbrel with Dr. Corliss Skains  Reports she is intolerant of statins.  Had elevated liver functions tests.  I note her last CMP in may of this year her liver function test normalized    No Known Allergies Past Medical History  Diagnosis Date  . Dermatophytosis, scalp   . De Quervain's disease (tenosynovitis)   . Hypercholesteremia   . Polycystic ovarian disease   . Obesity   . Diabetes mellitus    Past Surgical History  Procedure Date  . Wrist surgery     carpel tunnel    History   Social History  . Marital Status: Married    Spouse Name: N/A    Number of Children: N/A  . Years of Education: N/A   Occupational History  . 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 Smoking status: Former Smoker -- 1.3 packs/day    Types: Cigarettes    Start date: 06/12/1984    Quit date: 03/18/1995  . Smokeless tobacco: Never Used     Comment: Not interested in returning to smoking.   . Alcohol Use: 3.5 oz/week    7 drink(s) per week     Comment: a martini a night  . Drug Use: No  . Sexually Active: Yes -- Female partner(s)     Comment: married   Other Topics Concern  . Not on file   Social History Narrative  . No  narrative on file   Family History  Problem Relation Age of Onset  . Hypertension    . Diabetes type II    . Obesity    . Diabetes Father   . Hypertension Father   . Stroke Father   . Kidney disease Father   . Cancer Mother 36    breast ca died at age 43  . Obesity Brother   . Hypertension Brother   . Heart disease Maternal Grandmother   . Alzheimer's disease Maternal Grandmother   . Diabetes Paternal Grandmother   . Heart disease Paternal Grandfather    Patient Active Problem List  Diagnosis  . POLYCYSTIC OVARIES  . HYPERCHOLESTEROLEMIA  . OBESITY, UNSPECIFIED  . DERMATITIS, SCALP  . LATERAL EPICONDYLITIS, RIGHT  . DE QUERVAIN'S TENOSYNOVITIS, LEFT WRIST  . NONALLOPATHIC LESION OF LUMBAR REGION NEC  . NONALLOPATHIC LESION OF RIB CAGE NEC  . Elevated liver enzymes  . Tendonitis of shoulder, right  . Arthritis  . Psoriasis  . Marriage separation  . Mechanical low back pain  . Diabetes mellitus due to underlying condition with hyperglycemia  .  Knee pain, bilateral  . Pes planus  . Acute diarrhea  . Abnormal colonoscopy  . Psoriatic arthritis   Current Outpatient Prescriptions on File Prior to Visit  Medication Sig Dispense Refill  . etanercept (ENBREL) 50 MG/ML injection Inject 0.98 mLs (50 mg total) into the skin once a week. On Wednesday      . folic acid (FOLVITE) 1 MG tablet Take 1 tablet (1 mg total) by mouth 2 (two) times daily.      . metFORMIN (GLUCOPHAGE) 1000 MG tablet Take 1 tablet (1,000 mg total) by mouth 2 (two) times daily with a meal.  180 tablet  1  . Methotrexate Sodium, PF, 50 MG/2ML SOLN Inject 15 mg as directed once a week. On Friday      . Omega-3 Fatty Acids (FISH OIL) 1200 MG CAPS Take 1-2 capsules (1,200-2,400 mg total) by mouth 2 (two) times daily. Takes 2400 mg qam and 1200 mg qpm      . clobetasol (OLUX) 0.05 % topical foam Apply topically daily as needed.    0  . hydrocortisone valerate cream (WESTCORT) 0.2 % Apply topically 2 (two) times  daily as needed. Occasional use      . ketoconazole (NIZORAL) 2 % cream Apply 1 application topically daily as needed.      . nystatin-triamcinolone (MYCOLOG II) cream Apply to scalp daily for next 10 days       . oxymetazoline (AFRIN) 0.05 % nasal spray Place 2 sprays into the nose daily as needed.      . TraMADol HCl 50 MG TBDP Take 1 tablet by mouth every 8 (eight) hours as needed.  90 tablet  3      Review of Systems See HPI    Objective:   Physical Exam Physical Exam  Nursing note and vitals reviewed.  Constitutional: She is oriented to person, place, and time. She appears well-developed and well-nourished.  HENT:  Head: Normocephalic and atraumatic.  Cardiovascular: Normal rate and regular rhythm. Exam reveals no gallop and no friction rub.  No murmur heard.  Pulmonary/Chest: Breath sounds normal. She has no wheezes. She has no rales.  Neurological: She is alert and oriented to person, place, and time.  Skin: Skin is warm and dry.  Psychiatric: She has a normal mood and affect. Her behavior is normal.  Ext no edema             Assessment & Plan:  Type II DM  Will re-order metformin.  She is to have AIC , lipids, and CMP done this week Schedule visit for Diabetes management  Will need micral and start ACE at some point  PCOS  Continue current meds  Psoriatic arthritis  Currently managed Dr. Corliss Skains.  On Enbrel  Hyperlipidemia  Will get labs  FH breast CA in mother  [facility]  FH of colon CA  Psoriasis  HIstory of Depression  Sees therapist Mariane Masters has never had meds  Schedule appt for Diabetes management

## 2012-05-28 NOTE — Patient Instructions (Addendum)
See me in 1-2 monhts

## 2012-05-29 ENCOUNTER — Other Ambulatory Visit: Payer: Self-pay | Admitting: Internal Medicine

## 2012-05-29 NOTE — Telephone Encounter (Signed)
Pt states she needs her metFORMIN (GLUCOPHAGE) 1000 MG tablet.. She needs it sent to [facility].Marland Kitchen

## 2012-05-29 NOTE — Telephone Encounter (Signed)
Refill request

## 2012-05-30 MED ORDER — METFORMIN HCL 1000 MG PO TABS: 1000.0000 mg | ORAL_TABLET | Freq: Two times a day (BID) | ORAL | Status: DC

## 2012-06-13 ENCOUNTER — Ambulatory Visit (HOSPITAL_COMMUNITY)
Admission: RE | Admit: 2012-06-13 | Discharge: 2012-06-13 | Disposition: A | Discharge status: Home / Self Care | Payer: 59 | Source: Ambulatory Visit | Attending: Obstetrics and Gynecology | Admitting: Obstetrics and Gynecology

## 2012-06-13 DIAGNOSIS — Z1231 Encounter for screening mammogram for malignant neoplasm of breast: Secondary | ICD-10-CM | POA: Insufficient documentation

## 2012-06-19 ENCOUNTER — Other Ambulatory Visit: Payer: Self-pay | Admitting: Obstetrics and Gynecology

## 2012-06-19 DIAGNOSIS — R928 Other abnormal and inconclusive findings on diagnostic imaging of breast: Secondary | ICD-10-CM

## 2012-06-26 ENCOUNTER — Ambulatory Visit
Admission: RE | Admit: 2012-06-26 | Discharge: 2012-06-26 | Disposition: A | Discharge status: Home / Self Care | Payer: 59 | Source: Ambulatory Visit | Attending: Obstetrics and Gynecology | Admitting: Obstetrics and Gynecology

## 2012-06-26 ENCOUNTER — Other Ambulatory Visit: Payer: Self-pay | Admitting: Obstetrics and Gynecology

## 2012-06-26 DIAGNOSIS — R928 Other abnormal and inconclusive findings on diagnostic imaging of breast: Secondary | ICD-10-CM

## 2012-07-01 ENCOUNTER — Encounter: Payer: Self-pay | Admitting: *Deleted

## 2012-07-03 ENCOUNTER — Ambulatory Visit
Admission: RE | Admit: 2012-07-03 | Discharge: 2012-07-03 | Disposition: A | Discharge status: Home / Self Care | Payer: 59 | Source: Ambulatory Visit | Attending: Obstetrics and Gynecology | Admitting: Obstetrics and Gynecology

## 2012-07-03 ENCOUNTER — Other Ambulatory Visit: Payer: Self-pay | Admitting: Obstetrics and Gynecology

## 2012-07-03 ENCOUNTER — Other Ambulatory Visit: Payer: Self-pay | Admitting: Family Medicine

## 2012-07-03 DIAGNOSIS — R928 Other abnormal and inconclusive findings on diagnostic imaging of breast: Secondary | ICD-10-CM

## 2012-07-03 DIAGNOSIS — N63 Unspecified lump in unspecified breast: Secondary | ICD-10-CM

## 2012-07-04 ENCOUNTER — Encounter: Payer: Self-pay | Admitting: Internal Medicine

## 2012-07-04 ENCOUNTER — Ambulatory Visit
Admission: RE | Admit: 2012-07-04 | Discharge: 2012-07-04 | Disposition: A | Discharge status: Home / Self Care | Payer: 59 | Source: Ambulatory Visit | Attending: Obstetrics and Gynecology | Admitting: Obstetrics and Gynecology

## 2012-07-04 DIAGNOSIS — N63 Unspecified lump in unspecified breast: Secondary | ICD-10-CM

## 2012-07-04 DIAGNOSIS — R928 Other abnormal and inconclusive findings on diagnostic imaging of breast: Secondary | ICD-10-CM | POA: Insufficient documentation

## 2012-07-27 ENCOUNTER — Other Ambulatory Visit: Payer: Self-pay

## 2012-08-28 ENCOUNTER — Ambulatory Visit: Payer: 59 | Admitting: Internal Medicine

## 2013-04-15 ENCOUNTER — Ambulatory Visit (INDEPENDENT_AMBULATORY_CARE_PROVIDER_SITE_OTHER): Payer: 59 | Admitting: Family Medicine

## 2013-04-15 ENCOUNTER — Encounter: Payer: Self-pay | Admitting: Family Medicine

## 2013-04-15 ENCOUNTER — Other Ambulatory Visit (INDEPENDENT_AMBULATORY_CARE_PROVIDER_SITE_OTHER): Payer: 59

## 2013-04-15 VITALS — BP 132/84 | HR 82 | Ht 63.0 in | Wt 270.0 lb

## 2013-04-15 DIAGNOSIS — IMO0002 Reserved for concepts with insufficient information to code with codable children: Secondary | ICD-10-CM

## 2013-04-15 DIAGNOSIS — M25561 Pain in right knee: Secondary | ICD-10-CM

## 2013-04-15 DIAGNOSIS — M25569 Pain in unspecified knee: Secondary | ICD-10-CM

## 2013-04-15 DIAGNOSIS — S83206A Unspecified tear of unspecified meniscus, current injury, right knee, initial encounter: Secondary | ICD-10-CM | POA: Insufficient documentation

## 2013-04-15 NOTE — Assessment & Plan Note (Signed)
Patient had injection as described above. The patient was fitted with a brace today by me. Discussed home exercises I will be beneficial. Discussed weight loss and the importance. Discussed proper shoe wear Patient will come back again in 4 weeks for further evaluation. Patient is having mechanical symptoms she'll come back sooner. If patient continues to have pain we need to consider x-rays and potentially getting an MRI to see the internal aspect of this meniscal tear.

## 2013-04-15 NOTE — Patient Instructions (Signed)
You always make my day! Injected your knee today.  Try the brace with activity Tylenol 650 mg 3 times a day Ice 20 minutes 2 times a day Exercises daily.  Call me in 2 weeks if not better, otherwise come back in 4 weeks.

## 2013-04-15 NOTE — Progress Notes (Signed)
CC: Right knee pain  HPI: Patient is a very pleasant 47 year old female coming in with right knee pain. Patient states that she's had this pain in her medically for quite some time it seems to be worsening over the course last 2 months. Patient does have a past medical history significant for psoriatic arthritis. Patient has been on different disease modifying agents in seem to be in stable control. Patient does state she has some clicking and sometimes a feeling of a locking of this knee but she has never had given out on her. Patient denies any significant swelling any radiation of pain or any weakness of the leg. Patient is still able to do all her activities daily living but states that she has pain at about 6/10. It does seem to respond to some anti-inflammatories but she tries to avoid them when possible.   Past medical, surgical, family and social history reviewed. Medications reviewed all in the electronic medical record.   Review of Systems: No headache, visual changes, nausea, vomiting, diarrhea, constipation, dizziness, abdominal pain, skin rash, fevers, chills, night sweats, weight loss, swollen lymph nodes, body aches, joint swelling, muscle aches, chest pain, shortness of breath, mood changes.   Objective:    Blood pressure 132/84, pulse 82, height 5\' 3"  (1.6 m), weight 270 lb (122.471 kg), SpO2 97.00%.   General: No apparent distress alert and oriented x3 mood and affect normal, dressed appropriately.  HEENT: Pupils equal, extraocular movements intact Respiratory: Patient's speak in full sentences and does not appear short of breath Cardiovascular: No lower extremity edema, non tender, no erythema Skin: Warm dry intact with no signs of infection or rash on extremities or on axial skeleton. Abdomen: Soft nontender Neuro: Cranial nerves II through XII are intact, neurovascularly intact in all extremities with 2+ DTRs and 2+ pulses. Lymph: No lymphadenopathy of posterior or anterior  cervical chain or axillae bilaterally.  Gait normal with good balance and coordination.  MSK: Non tender with full range of motion and good stability and symmetric strength and tone of shoulders, elbows, wrist, hip and ankles bilaterally.  Knee: Right Normal to inspection with no erythema or effusion or obvious bony abnormalities. Palpation shows the patient is very tender to palpation over the medial joint line ROM full in flexion and extension and lower leg rotation. Ligaments with solid consistent endpoints including ACL, PCL, LCL, MCL. Positive Mcmurray's, Apley's, and Thessalonian tests. Positive painful patellar compression. Patellar glide with crepitus. Patellar and quadriceps tendons unremarkable. Hamstring and quadriceps strength is normal.  Contralateral knee his same patellar compression pain but otherwise unremarkable with no joint line tenderness and full range of motion with full strength.  MSK US performed of: Right knee This study was ordered, performed, and interpreted by Terrilee Files D.O.  Knee: All structures visualized. Anterolateral, posteromedial, and posterolateral menisci unremarkable without tearing, fraying, effusion, or displacement. Anterior medial meniscus does show that patient has an acute on chronic tear with hypoechoic changes. There is significant displacement mostly over the medial aspect. There is a possibility that this is a full thickness tear by mild displacement of the lateral most anterior portion of the meniscus. Patellar Tendon unremarkable on long and transverse views without effusion. No abnormality of prepatellar bursa. LCL and MCL unremarkable on long and transverse views. No abnormality of origin of medial or lateral head of the gastrocnemius.  IMPRESSION:  Acute on chronic medial meniscal tear  After informed written and verbal consent, patient was seated on exam table. Right knee was  prepped with alcohol swab and utilizing anterolateral  approach, patient's right knee space was injected with 4:1  marcaine 0.5%: Kenalog 40mg /dL. Patient tolerated the procedure well without immediate complications.   Impression and Recommendations:     This case required medical decision making of moderate complexity.

## 2013-04-17 ENCOUNTER — Other Ambulatory Visit: Payer: Self-pay

## 2013-04-18 ENCOUNTER — Ambulatory Visit (INDEPENDENT_AMBULATORY_CARE_PROVIDER_SITE_OTHER): Payer: 59 | Admitting: Family Medicine

## 2013-04-18 ENCOUNTER — Encounter: Payer: Self-pay | Admitting: Family Medicine

## 2013-04-18 VITALS — BP 132/82 | HR 80

## 2013-04-18 DIAGNOSIS — M25569 Pain in unspecified knee: Secondary | ICD-10-CM

## 2013-04-18 DIAGNOSIS — M25561 Pain in right knee: Secondary | ICD-10-CM

## 2013-04-18 DIAGNOSIS — Z5189 Encounter for other specified aftercare: Secondary | ICD-10-CM

## 2013-04-18 DIAGNOSIS — S83206D Unspecified tear of unspecified meniscus, current injury, right knee, subsequent encounter: Secondary | ICD-10-CM

## 2013-04-18 MED ORDER — KETOROLAC TROMETHAMINE 60 MG/2ML IM SOLN
60.0000 mg | Freq: Once | INTRAMUSCULAR | Status: AC
  Administered 2013-04-18: 60 mg via INTRAMUSCULAR

## 2013-04-18 NOTE — Progress Notes (Signed)
CC: Right knee pain  HPI: Patient is a very pleasant 47 year old female coming in with worsening right knee pain. Patient was seen 4 days ago and was diagnosed with an acute on chronic meniscal tear. Patient did have a corticosteroid injection. Patient states for the last 72 hours he seemed to be doing significantly better. Patient had been wearing a brace and doing the exercises and seems to be doing well. Patient unfortunately did take some stairs Thursday and had a very hot loud audible pop that was extremely painful. Patient has some swelling of the knee both on the medial aspect where the meniscal tear is. Patient states now she extends her knee too much she has severe pain in and seems to lock into position. Patient feels that this is somewhat causing her to feel unstable on her feet. Patient still has a clicking sound from time to time that is very painful. Denies any radiation of the leg or any numbness.   Past medical, surgical, family and social history reviewed. Medications reviewed all in the electronic medical record.   Review of Systems: No headache, visual changes, nausea, vomiting, diarrhea, constipation, dizziness, abdominal pain, skin rash, fevers, chills, night sweats, weight loss, swollen lymph nodes, body aches, joint swelling, muscle aches, chest pain, shortness of breath, mood changes.   Objective:    Blood pressure 132/82, pulse 80, SpO2 96.00%.   General: No apparent distress alert and oriented x3 mood and affect normal, dressed appropriately.  HEENT: Pupils equal, extraocular movements intact Respiratory: Patient's speak in full sentences and does not appear short of breath Cardiovascular: No lower extremity edema, non tender, no erythema Skin: Warm dry intact with no signs of infection or rash on extremities or on axial skeleton. Abdomen: Soft nontender Neuro: Cranial nerves II through XII are intact, neurovascularly intact in all extremities with 2+ DTRs and 2+  pulses. Lymph: No lymphadenopathy of posterior or anterior cervical chain or axillae bilaterally.  Gait normal with good balance and coordination.  MSK: Non tender with full range of motion and good stability and symmetric strength and tone of shoulders, elbows, wrist, hip and ankles bilaterally.  Knee: Right Normal to inspection with no erythema or effusion or obvious bony abnormalities. Palpation shows the patient is very tender to palpation over the medial joint line ROM full in flexion and extension and lower leg rotation. Ligaments with solid consistent endpoints including ACL, PCL, LCL, MCL. Positive Mcmurray's, Apley's, and Thessalonian tests. Positive painful patellar compression. Patellar glide with crepitus. Patellar and quadriceps tendons unremarkable. Hamstring and quadriceps strength is normal.  Contralateral knee his same patellar compression pain but otherwise unremarkable with no joint line tenderness and full range of motion with full strength.  MSK US performed of: Right knee This study was ordered, performed, and interpreted by Terrilee Files D.O.  Knee: All structures visualized. Anterolateral, and posterolateral menisci unremarkable without tearing, fraying, effusion, or displacement. Anterior medial meniscus does show that patient has an acute on chronic tear with hypoechoic changes. There is significant displacement mostly over the medial aspect. Significant more hypoechoic area as well as further tearing that does seem to be full-thickness. This does expand to the posterior medial aspect as well. Patellar Tendon unremarkable on long and transverse views without effusion. No abnormality of prepatellar bursa. LCL and MCL unremarkable on long and transverse views. No abnormality of origin of medial or lateral head of the gastrocnemius. Trace effusion of the suprapatellar pouch  IMPRESSION:  Worsening meniscal tear  Impression and Recommendations:     This case  required medical decision making of moderate complexity.

## 2013-04-18 NOTE — Patient Instructions (Signed)
I am sorry you have to see me so frequently.  Go down and get xrays today.  We will get a MRI. They will call you for appointment.  I will call you with the results.  Toradol today, continue other meds and brace.

## 2013-04-18 NOTE — Assessment & Plan Note (Signed)
Patient likely responded very well to the steroid injection and decrease amount of fluid unfortunately allowed for greater movement. Patient then unfortunately just was unlucky while taking stairs and further exacerbated this tear. I am concerned now the patient is having mechanical symptoms that conservative therapy he may not be possible. Patient is going to continue bracing and icing and ambulate as needed. Patient will be out of week for the next 48 hours. We will get x-rays as well as MRI to see full extent of this tear. Will discuss further followup

## 2013-04-21 ENCOUNTER — Ambulatory Visit (HOSPITAL_COMMUNITY): Payer: 59

## 2013-04-23 ENCOUNTER — Ambulatory Visit (INDEPENDENT_AMBULATORY_CARE_PROVIDER_SITE_OTHER)
Admission: RE | Admit: 2013-04-23 | Discharge: 2013-04-23 | Disposition: A | Discharge status: Home / Self Care | Payer: 59 | Source: Ambulatory Visit | Attending: Family Medicine | Admitting: Family Medicine

## 2013-04-23 DIAGNOSIS — M25569 Pain in unspecified knee: Secondary | ICD-10-CM

## 2013-04-23 DIAGNOSIS — M25561 Pain in right knee: Secondary | ICD-10-CM

## 2013-04-24 ENCOUNTER — Ambulatory Visit (HOSPITAL_COMMUNITY)
Admission: RE | Admit: 2013-04-24 | Discharge: 2013-04-24 | Disposition: A | Discharge status: Home / Self Care | Payer: 59 | Source: Ambulatory Visit | Attending: Family Medicine | Admitting: Family Medicine

## 2013-04-24 DIAGNOSIS — M6580 Other synovitis and tenosynovitis, unspecified site: Secondary | ICD-10-CM | POA: Insufficient documentation

## 2013-04-24 DIAGNOSIS — M1710 Unilateral primary osteoarthritis, unspecified knee: Secondary | ICD-10-CM | POA: Insufficient documentation

## 2013-04-24 DIAGNOSIS — M25469 Effusion, unspecified knee: Secondary | ICD-10-CM | POA: Insufficient documentation

## 2013-04-24 DIAGNOSIS — X58XXXA Exposure to other specified factors, initial encounter: Secondary | ICD-10-CM | POA: Insufficient documentation

## 2013-04-24 DIAGNOSIS — M6740 Ganglion, unspecified site: Secondary | ICD-10-CM | POA: Insufficient documentation

## 2013-04-24 DIAGNOSIS — IMO0002 Reserved for concepts with insufficient information to code with codable children: Secondary | ICD-10-CM | POA: Insufficient documentation

## 2013-04-24 DIAGNOSIS — M25561 Pain in right knee: Secondary | ICD-10-CM

## 2013-04-30 NOTE — H&P (Signed)
Tabitha [name] is an 47 y.o. female.   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  Dr. Katrinka Blazing had given her one injection which helped for a short while the pain came back.  She said the pain when she ambulates and pain with stair climbing.  She also feels it becomes stuck in the flexed position occasionally.  A recent MRI scan dated 04/24/13 shows a large medial meniscus tear.  It also shows some breakdown of the patellofemoral cartilage.  We have discussed proceeding with a right knee arthroscopy to try to calm her symptoms down and make her ambulatory without pain.  Past Medical History  Diagnosis Date  . Dermatophytosis, scalp   . De Quervain's disease (tenosynovitis)   . Hypercholesteremia   . Polycystic ovarian disease   . Obesity   . Diabetes mellitus     Past Surgical History  Procedure Laterality Date  . Wrist surgery      carpel tunnel     Family History  Problem Relation Age of Onset  . Hypertension    . Diabetes type II    . Obesity    . Diabetes Father   . Hypertension Father   . Stroke Father   . Kidney disease Father   . Cancer Mother 58    breast ca died at age 64  . Obesity Brother   . Hypertension Brother   . Heart disease Maternal Grandmother   . Alzheimer's disease Maternal Grandmother   . Diabetes Paternal Grandmother   . Heart disease Paternal Grandfather    Social History:  reports that she quit smoking about 18 years ago. Her smoking use included Cigarettes. She started smoking about 28 years ago. She smoked 1.30 packs per day. She has never used smokeless tobacco. She reports that she drinks about 3.5 ounces of alcohol per week. She reports that she does not use illicit drugs.  Allergies:  Allergies  Allergen Reactions  . Statins     Intolerant  lft elevation    No prescriptions prior to admission    No  results found for this or any previous visit (from the past 48 hour(s)). No results found.  Review of Systems  Constitutional: Negative.   HENT: Negative.   Eyes: Negative.   Respiratory: Negative.   Cardiovascular: Negative.   Gastrointestinal: Negative.   Genitourinary: Negative.   Musculoskeletal: Positive for joint pain.  Skin: Negative.   Neurological: Negative.   Endo/Heme/Allergies: Negative.   Psychiatric/Behavioral: Negative.     There were no vitals taken for this visit. Physical Exam  Constitutional: She is oriented to person, place, and time. She appears well-nourished.  HENT:  Head: Atraumatic.  Eyes: Conjunctivae are normal.  Neck: Neck supple.  Cardiovascular: Normal rate.   Respiratory: Effort normal.  GI: Soft.  Musculoskeletal:  Right knee exam: 1+ effusion.  Range of motion 0-1 20.  Medial joint line pain to palpation.  Ligaments are stable.  Neurological: She is oriented to person, place, and time.  Skin: Skin is warm.  Psychiatric: She has a normal mood and affect.     Assessment/Plan Assessment: Right knee torn medial meniscus and chondral malacia patella by recent MRI scan.  Injected recently. Plan: Because of her continued pain and history of locking we have decided to proceed with a knee arthroscopy.  I've reviewed the risks  of anesthesia, infection and potential DVT associated with knee arthroscopy.  Distress importance of postoperative therapy to optimize the results.  Tabitha [name] R 04/30/2013, 5:48 PM

## 2013-05-01 ENCOUNTER — Encounter (HOSPITAL_BASED_OUTPATIENT_CLINIC_OR_DEPARTMENT_OTHER): Payer: Self-pay | Admitting: *Deleted

## 2013-05-01 NOTE — Progress Notes (Signed)
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

## 2013-05-02 ENCOUNTER — Other Ambulatory Visit: Payer: Self-pay | Admitting: Orthopaedic Surgery

## 2013-05-05 ENCOUNTER — Encounter (HOSPITAL_BASED_OUTPATIENT_CLINIC_OR_DEPARTMENT_OTHER)
Admission: RE | Admit: 2013-05-05 | Discharge: 2013-05-05 | Disposition: A | Discharge status: Home / Self Care | Payer: 59 | Source: Ambulatory Visit | Attending: Orthopaedic Surgery | Admitting: Orthopaedic Surgery

## 2013-05-05 LAB — BASIC METABOLIC PANEL
BUN: 11 mg/dL (ref 6–23)
CO2: 26 mEq/L (ref 19–32)
Calcium: 9.5 mg/dL (ref 8.4–10.5)
Chloride: 102 mEq/L (ref 96–112)
Creatinine, Ser: 0.55 mg/dL (ref 0.50–1.10)
GFR calc Af Amer: >90 mL/min (ref >90)
GFR calc non Af Amer: >90 mL/min (ref >90)
Glucose, Bld: 204 mg/dL — ABNORMAL HIGH (ref 70–99)
Potassium: 4.7 mEq/L (ref 3.5–5.1)
Sodium: 137 mEq/L (ref 135–145)

## 2013-05-06 ENCOUNTER — Encounter (HOSPITAL_BASED_OUTPATIENT_CLINIC_OR_DEPARTMENT_OTHER)
Admission: RE | Disposition: A | Discharge status: Home / Self Care | Payer: Self-pay | Source: Ambulatory Visit | Attending: Orthopaedic Surgery

## 2013-05-06 ENCOUNTER — Encounter (HOSPITAL_BASED_OUTPATIENT_CLINIC_OR_DEPARTMENT_OTHER): Payer: 59 | Admitting: Anesthesiology

## 2013-05-06 ENCOUNTER — Ambulatory Visit (HOSPITAL_BASED_OUTPATIENT_CLINIC_OR_DEPARTMENT_OTHER)
Admission: RE | Admit: 2013-05-06 | Discharge: 2013-05-06 | Disposition: A | Discharge status: Home / Self Care | Payer: 59 | Source: Ambulatory Visit | Attending: Orthopaedic Surgery | Admitting: Orthopaedic Surgery

## 2013-05-06 ENCOUNTER — Encounter (HOSPITAL_BASED_OUTPATIENT_CLINIC_OR_DEPARTMENT_OTHER): Payer: Self-pay | Admitting: *Deleted

## 2013-05-06 ENCOUNTER — Ambulatory Visit (HOSPITAL_BASED_OUTPATIENT_CLINIC_OR_DEPARTMENT_OTHER): Payer: 59 | Admitting: Anesthesiology

## 2013-05-06 DIAGNOSIS — M23302 Other meniscus derangements, unspecified lateral meniscus, unspecified knee: Secondary | ICD-10-CM | POA: Insufficient documentation

## 2013-05-06 DIAGNOSIS — W19XXXS Unspecified fall, sequela: Secondary | ICD-10-CM | POA: Insufficient documentation

## 2013-05-06 DIAGNOSIS — M2240 Chondromalacia patellae, unspecified knee: Secondary | ICD-10-CM | POA: Insufficient documentation

## 2013-05-06 DIAGNOSIS — M67919 Unspecified disorder of synovium and tendon, unspecified shoulder: Secondary | ICD-10-CM | POA: Insufficient documentation

## 2013-05-06 DIAGNOSIS — E119 Type 2 diabetes mellitus without complications: Secondary | ICD-10-CM | POA: Insufficient documentation

## 2013-05-06 DIAGNOSIS — E282 Polycystic ovarian syndrome: Secondary | ICD-10-CM | POA: Insufficient documentation

## 2013-05-06 DIAGNOSIS — E669 Obesity, unspecified: Secondary | ICD-10-CM | POA: Insufficient documentation

## 2013-05-06 DIAGNOSIS — IMO0002 Reserved for concepts with insufficient information to code with codable children: Secondary | ICD-10-CM | POA: Insufficient documentation

## 2013-05-06 DIAGNOSIS — Z87891 Personal history of nicotine dependence: Secondary | ICD-10-CM | POA: Insufficient documentation

## 2013-05-06 DIAGNOSIS — Z01812 Encounter for preprocedural laboratory examination: Secondary | ICD-10-CM | POA: Insufficient documentation

## 2013-05-06 DIAGNOSIS — M719 Bursopathy, unspecified: Secondary | ICD-10-CM | POA: Insufficient documentation

## 2013-05-06 DIAGNOSIS — Z9889 Other specified postprocedural states: Secondary | ICD-10-CM

## 2013-05-06 DIAGNOSIS — Z01810 Encounter for preprocedural cardiovascular examination: Secondary | ICD-10-CM | POA: Insufficient documentation

## 2013-05-06 HISTORY — DX: Presence of spectacles and contact lenses: Z97.3

## 2013-05-06 HISTORY — DX: Arthropathic psoriasis, unspecified: L40.50

## 2013-05-06 HISTORY — DX: Unspecified osteoarthritis, unspecified site: M19.90

## 2013-05-06 HISTORY — PX: KNEE ARTHROSCOPY: SHX127

## 2013-05-06 LAB — POCT HEMOGLOBIN-HEMACUE: Hemoglobin: 14.8 g/dL (ref 12.0–15.0)

## 2013-05-06 LAB — GLUCOSE, CAPILLARY
Glucose-Capillary: 168 mg/dL — ABNORMAL HIGH (ref 70–99)
Glucose-Capillary: 160 mg/dL — ABNORMAL HIGH (ref 70–99)

## 2013-05-06 SURGERY — ARTHROSCOPY, KNEE
Anesthesia: Monitor Anesthesia Care | Site: Knee | Laterality: Right | Wound class: Clean

## 2013-05-06 MED ORDER — MIDAZOLAM HCL 2 MG/2ML IJ SOLN
INTRAMUSCULAR | Status: AC
  Filled 2013-05-06: qty 2

## 2013-05-06 MED ORDER — SODIUM CHLORIDE 0.9 % IR SOLN
Status: DC | PRN
  Administered 2013-05-06: 3000 mL

## 2013-05-06 MED ORDER — METHYLPREDNISOLONE ACETATE 80 MG/ML IJ SUSP
INTRAMUSCULAR | Status: AC
  Filled 2013-05-06: qty 1

## 2013-05-06 MED ORDER — METHYLPREDNISOLONE ACETATE 80 MG/ML IJ SUSP
INTRAMUSCULAR | Status: DC | PRN
  Administered 2013-05-06: 80 mg

## 2013-05-06 MED ORDER — PROPOFOL 10 MG/ML IV BOLUS
INTRAVENOUS | Status: AC
  Filled 2013-05-06: qty 20

## 2013-05-06 MED ORDER — FENTANYL CITRATE 0.05 MG/ML IJ SOLN
50.0000 ug | INTRAMUSCULAR | Status: DC | PRN
  Administered 2013-05-06: 100 ug via INTRAVENOUS

## 2013-05-06 MED ORDER — FENTANYL CITRATE 0.05 MG/ML IJ SOLN
INTRAMUSCULAR | Status: DC | PRN
  Administered 2013-05-06 (×2): 50 ug via INTRAVENOUS

## 2013-05-06 MED ORDER — PROPOFOL INFUSION 10 MG/ML OPTIME
INTRAVENOUS | Status: DC | PRN
  Administered 2013-05-06: 200 ug/kg/min via INTRAVENOUS

## 2013-05-06 MED ORDER — OXYCODONE HCL 5 MG/5ML PO SOLN: 5.0000 mg | Freq: Once | ORAL | Status: DC | PRN

## 2013-05-06 MED ORDER — BUPIVACAINE HCL (PF) 0.5 % IJ SOLN
INTRAMUSCULAR | Status: DC | PRN
  Administered 2013-05-06: 20 mL

## 2013-05-06 MED ORDER — CEFAZOLIN SODIUM-DEXTROSE 2-3 GM-% IV SOLR: 2.0000 g | INTRAVENOUS | Status: DC

## 2013-05-06 MED ORDER — CEFAZOLIN SODIUM-DEXTROSE 2-3 GM-% IV SOLR
INTRAVENOUS | Status: AC
  Filled 2013-05-06: qty 50

## 2013-05-06 MED ORDER — FENTANYL CITRATE 0.05 MG/ML IJ SOLN
INTRAMUSCULAR | Status: AC
  Filled 2013-05-06: qty 2

## 2013-05-06 MED ORDER — OXYCODONE HCL 5 MG PO TABS: 5.0000 mg | ORAL_TABLET | Freq: Once | ORAL | Status: DC | PRN

## 2013-05-06 MED ORDER — LACTATED RINGERS IV SOLN
INTRAVENOUS | Status: DC
  Administered 2013-05-06: 07:00:00 via INTRAVENOUS

## 2013-05-06 MED ORDER — FENTANYL CITRATE 0.05 MG/ML IJ SOLN
INTRAMUSCULAR | Status: AC
  Filled 2013-05-06: qty 4

## 2013-05-06 MED ORDER — ONDANSETRON HCL 4 MG/2ML IJ SOLN
INTRAMUSCULAR | Status: DC | PRN
  Administered 2013-05-06: 4 mg via INTRAVENOUS

## 2013-05-06 MED ORDER — LIDOCAINE HCL (CARDIAC) 20 MG/ML IV SOLN
INTRAVENOUS | Status: DC | PRN
  Administered 2013-05-06: 30 mg via INTRAVENOUS

## 2013-05-06 MED ORDER — HYDROMORPHONE HCL PF 1 MG/ML IJ SOLN: 0.2500 mg | INTRAMUSCULAR | Status: DC | PRN

## 2013-05-06 MED ORDER — DEXAMETHASONE SODIUM PHOSPHATE 4 MG/ML IJ SOLN
INTRAMUSCULAR | Status: DC | PRN
  Administered 2013-05-06: 4 mg via INTRAVENOUS

## 2013-05-06 MED ORDER — CEFAZOLIN SODIUM 1-5 GM-% IV SOLN
INTRAVENOUS | Status: AC
  Filled 2013-05-06: qty 50

## 2013-05-06 MED ORDER — MIDAZOLAM HCL 2 MG/2ML IJ SOLN
1.0000 mg | INTRAMUSCULAR | Status: DC | PRN
  Administered 2013-05-06: 2 mg via INTRAVENOUS

## 2013-05-06 MED ORDER — BUPIVACAINE-EPINEPHRINE PF 0.5-1:200000 % IJ SOLN
INTRAMUSCULAR | Status: DC | PRN
  Administered 2013-05-06: 25 mL

## 2013-05-06 MED ORDER — LACTATED RINGERS IV SOLN: INTRAVENOUS | Status: DC

## 2013-05-06 MED ORDER — HYDROCODONE-ACETAMINOPHEN 5-325 MG PO TABS: 1.0000 | ORAL_TABLET | Freq: Four times a day (QID) | ORAL | Status: DC | PRN

## 2013-05-06 MED ORDER — METHYLPREDNISOLONE ACETATE 40 MG/ML IJ SUSP
INTRAMUSCULAR | Status: AC
  Filled 2013-05-06: qty 1

## 2013-05-06 MED ORDER — MORPHINE SULFATE 4 MG/ML IJ SOLN
INTRAMUSCULAR | Status: AC
  Filled 2013-05-06: qty 1

## 2013-05-06 MED ORDER — BUPIVACAINE HCL (PF) 0.5 % IJ SOLN
INTRAMUSCULAR | Status: AC
  Filled 2013-05-06: qty 30

## 2013-05-06 MED ORDER — BUPIVACAINE HCL (PF) 0.5 % IJ SOLN
INTRAMUSCULAR | Status: DC | PRN
  Administered 2013-05-06: 10 mL

## 2013-05-06 MED ORDER — CHLORHEXIDINE GLUCONATE 4 % EX LIQD: 60.0000 mL | Freq: Once | CUTANEOUS | Status: DC

## 2013-05-06 SURGICAL SUPPLY — 40 items
BANDAGE ELASTIC 6 VELCRO ST LF (GAUZE/BANDAGES/DRESSINGS) ×2 IMPLANT
BANDAGE GAUZE ELAST BULKY 4 IN (GAUZE/BANDAGES/DRESSINGS) ×2 IMPLANT
BLADE CUDA 5.5 (BLADE) IMPLANT
BLADE CUTTER GATOR 3.5 (BLADE) ×2 IMPLANT
BLADE GREAT WHITE 4.2 (BLADE) ×2 IMPLANT
CANISTER SUCT 3000ML (MISCELLANEOUS) IMPLANT
CANISTER SUCT LVC 12 LTR MEDI- (MISCELLANEOUS) ×2 IMPLANT
DRAPE ARTHROSCOPY W/POUCH 114 (DRAPES) ×2 IMPLANT
DRAPE U-SHAPE 47X51 STRL (DRAPES) ×2 IMPLANT
DRSG EMULSION OIL 3X3 NADH (GAUZE/BANDAGES/DRESSINGS) ×2 IMPLANT
DURAPREP 26ML APPLICATOR (WOUND CARE) ×2 IMPLANT
ELECT MENISCUS 165MM 90D (ELECTRODE) IMPLANT
ELECT REM PT RETURN 9FT ADLT (ELECTROSURGICAL)
ELECTRODE REM PT RTRN 9FT ADLT (ELECTROSURGICAL) IMPLANT
GLOVE BIO SURGEON STRL SZ8.5 (GLOVE) ×2 IMPLANT
GLOVE BIOGEL PI IND STRL 8 (GLOVE) ×1 IMPLANT
GLOVE BIOGEL PI IND STRL 8.5 (GLOVE) ×1 IMPLANT
GLOVE BIOGEL PI INDICATOR 8 (GLOVE) ×1
GLOVE BIOGEL PI INDICATOR 8.5 (GLOVE) ×1
GLOVE SKINSENSE NS SZ7.5 (GLOVE) ×1
GLOVE SKINSENSE STRL SZ7.5 (GLOVE) ×1 IMPLANT
GLOVE SS BIOGEL STRL SZ 8 (GLOVE) ×1 IMPLANT
GLOVE SUPERSENSE BIOGEL SZ 8 (GLOVE) ×1
GOWN PREVENTION PLUS XLARGE (GOWN DISPOSABLE) ×2 IMPLANT
GOWN PREVENTION PLUS XXLARGE (GOWN DISPOSABLE) ×4 IMPLANT
IV NS IRRIG 3000ML ARTHROMATIC (IV SOLUTION) IMPLANT
KNEE WRAP E Z 3 GEL PACK (MISCELLANEOUS) ×2 IMPLANT
PACK ARTHROSCOPY DSU (CUSTOM PROCEDURE TRAY) ×2 IMPLANT
PACK BASIN DAY SURGERY FS (CUSTOM PROCEDURE TRAY) ×2 IMPLANT
PENCIL BUTTON HOLSTER BLD 10FT (ELECTRODE) IMPLANT
SET ARTHROSCOPY TUBING (MISCELLANEOUS) ×1
SET ARTHROSCOPY TUBING LN (MISCELLANEOUS) ×1 IMPLANT
SHEET MEDIUM DRAPE 40X70 STRL (DRAPES) ×2 IMPLANT
SPONGE GAUZE 4X4 12PLY (GAUZE/BANDAGES/DRESSINGS) ×2 IMPLANT
SYR 3ML 18GX1 1/2 (SYRINGE) IMPLANT
TOWEL OR 17X24 6PK STRL BLUE (TOWEL DISPOSABLE) ×2 IMPLANT
TOWEL OR NON WOVEN STRL DISP B (DISPOSABLE) ×2 IMPLANT
WAND 30 DEG SABER W/CORD (SURGICAL WAND) IMPLANT
WAND STAR VAC 90 (SURGICAL WAND) IMPLANT
WATER STERILE IRR 1000ML POUR (IV SOLUTION) ×2 IMPLANT

## 2013-05-06 NOTE — Anesthesia Procedure Notes (Signed)
Procedure Name: MAC Date/Time: 05/06/2013 7:43 AM Performed by: Velna Ochs Pre-anesthesia Checklist: Patient identified, Emergency Drugs available, Suction available, Patient being monitored and Timeout performed Patient Re-evaluated:Patient Re-evaluated prior to inductionOxygen Delivery Method: Simple face mask Preoxygenation: Pre-oxygenation with 100% oxygen Intubation Type: IV induction Ventilation: Mask ventilation without difficulty Dental Injury: Teeth and Oropharynx as per pre-operative assessment

## 2013-05-06 NOTE — Anesthesia Postprocedure Evaluation (Signed)
  Anesthesia Post-op Note  Patient: Tabitha [name]  Procedure(s) Performed: Procedure(s) with comments: RIGHT KNEE ARTHROSCOPY  (Right) - partial lateral minisectomy and chondroplasty  Patient Location: PACU  Anesthesia Type:General  Level of Consciousness: awake and alert   Airway and Oxygen Therapy: Patient Spontanous Breathing  Post-op Pain: none  Post-op Assessment: Post-op Vital signs reviewed, Patient's Cardiovascular Status Stable and Respiratory Function Stable  Post-op Vital Signs: Reviewed  Filed Vitals:   05/06/13 0859  BP:   Pulse: 69  Temp:   Resp: 20    Complications: No apparent anesthesia complications

## 2013-05-06 NOTE — Op Note (Signed)
NAME:  Tabitha [name], Tabitha [name]                   ACCOUNT NO.:  [id number]  MEDICAL RECORD NO.:  [id number]  LOCATION:                                 FACILITY:  PHYSICIAN:  Suri Tafolla G. Laberta Wilbon, M.D.DATE OF BIRTH:  02/11/1966  DATE OF PROCEDURE:  05/06/2013 DATE OF DISCHARGE:  05/06/2013                              OPERATIVE REPORT   PREOPERATIVE DIAGNOSES: 1. Right knee torn medial meniscus. 2. Right knee chondromalacia.  POSTOPERATIVE DIAGNOSES: 1. Right knee torn lateral meniscus. 2. Right knee chondromalacia.  PROCEDURES: 1. Right knee partial lateral meniscectomy. 2. Right knee abrasion chondroplasty, all 3 compartments.  ANESTHESIA:  Knee block and MAC.  ATTENDING SURGEON:  Roby Donaway G. 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  This has persisted despite various pills and injections and braces.  By MRI scan, she has meniscal tears and some degenerative change.  She has pain which limits her ability to rest and work and walk and she is offered an arthroscopy. Informed operative consent was obtained after discussion of possible complications including reaction to anesthesia and infection.  SUMMARY OF FINDINGS AND PROCEDURE:  Under knee block and MAC, a right knee arthroscopy was performed.  Suprapatellar pouch was benign while the patellofemoral joint exhibited some grade 3 and very focal grade 4 change.  Chondroplasties were done here.  In the medial compartment, her medial meniscus appeared to be intact.  This was probed and was not displaceable.  The meniscus looked very good, despite the MRI finding. She did have some grade 3 change and focal grade 4 change in this compartment and thorough chondroplasties were done along with abrasion to bleeding bone with some tiny areas.  The ACL appeared to be intact. The lateral compartment was notable for an avascular lateral meniscus tear addressed about a  5% partial lateral meniscectomy.  She also had a loose body in this compartment which we eventually removed.  She had some grade 3 and grade 4 change in this compartment and thorough chondroplasties were done.  She was discharged home same day.  DESCRIPTION OF PROCEDURE:  The patient was taken to the operating suite where knee block and MAC were applied.  She was positioned supine and prepped and draped in normal sterile fashion.  After the administration of preop Kefzol, an appropriate time-out, an arthroscopy of the right knee was performed through total of 2 portals.  Findings were as noted above and procedure consisted predominantly of the abrasion chondroplasties and removal of loose body along with the small partial lateral meniscectomy.  The knee was thoroughly irrigated, followed by placement of Marcaine with morphine along with some Depo-Medrol. Adaptic was applied, followed by dry gauze and loose Ace wrap. Estimated blood loss and fluids obtained from anesthesia records.  DISPOSITION:  The patient was taken to recovery room in stable condition.  She was to go home same-day and follow up in the office closely.  I will contact her by phone tonight.     Rishikesh Khachatryan G. Martavia Tye, M.D.     PGD/MEDQ  D:  05/06/2013  T:  05/06/2013    Job:  [id number] 

## 2013-05-06 NOTE — Op Note (Deleted)
NAME:  Tabitha [name] NO.:  [id number]  MEDICAL RECORD NO.:  [id number]  LOCATION:                                 FACILITY:  PHYSICIAN:  Lubertha Basque. Jasminne Mealy, M.D.DATE OF BIRTH:  12/28/1965  DATE OF PROCEDURE:  05/06/2013 DATE OF DISCHARGE:  05/06/2013                              OPERATIVE REPORT   PREOPERATIVE DIAGNOSES: 1. Right knee torn medial meniscus. 2. Right knee chondromalacia.  POSTOPERATIVE DIAGNOSES: 1. Right knee torn lateral meniscus. 2. Right knee chondromalacia.  PROCEDURES: 1. Right knee partial lateral meniscectomy. 2. Right knee abrasion chondroplasty, all 3 compartments.  ANESTHESIA:  Knee block and MAC.  ATTENDING SURGEON:  Lubertha Basque. 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  This has persisted despite various pills and injections and braces.  By MRI scan, she has meniscal tears and some degenerative change.  She has pain which limits her ability to rest and work and walk and she is offered an arthroscopy. Informed operative consent was obtained after discussion of possible complications including reaction to anesthesia and infection.  SUMMARY OF FINDINGS AND PROCEDURE:  Under knee block and MAC, a right knee arthroscopy was performed.  Suprapatellar pouch was benign while the patellofemoral joint exhibited some grade 3 and very focal grade 4 change.  Chondroplasties were done here.  In the medial compartment, her medial meniscus appeared to be intact.  This was probed and was not displaceable.  The meniscus looked very good, despite the MRI finding. She did have some grade 3 change and focal grade 4 change in this compartment and thorough chondroplasties were done along with abrasion to bleeding bone with some tiny areas.  The ACL appeared to be intact. The lateral compartment was notable for an avascular lateral meniscus tear addressed about a  5% partial lateral meniscectomy.  She also had a loose body in this compartment which we eventually removed.  She had some grade 3 and grade 4 change in this compartment and thorough chondroplasties were done.  She was discharged home same day.  DESCRIPTION OF PROCEDURE:  The patient was taken to the operating suite where knee block and MAC were applied.  She was positioned supine and prepped and draped in normal sterile fashion.  After the administration of preop Kefzol, an appropriate time-out, an arthroscopy of the right knee was performed through total of 2 portals.  Findings were as noted above and procedure consisted predominantly of the abrasion chondroplasties and removal of loose body along with the small partial lateral meniscectomy.  The knee was thoroughly irrigated, followed by placement of Marcaine with morphine along with some Depo-Medrol. Adaptic was applied, followed by dry gauze and loose Ace wrap. Estimated blood loss and fluids obtained from anesthesia records.  DISPOSITION:  The patient was taken to recovery room in stable condition.  She was to go home same-day and follow up in the office closely.  I will contact her by phone tonight.     Lubertha Basque Jerl Santos, M.D.     PGD/MEDQ  D:  05/06/2013  T:  05/06/2013  Job:  [id number]

## 2013-05-06 NOTE — Progress Notes (Signed)
Assisted Dr. Fitzgerald with right, knee block. Side rails up, monitors on throughout procedure. See vital signs in flow sheet. Tolerated Procedure well. 

## 2013-05-06 NOTE — Anesthesia Preprocedure Evaluation (Signed)
Anesthesia Evaluation  Patient identified by MRN, date of birth, ID band Patient awake    Reviewed: Allergy & Precautions, H&P , NPO status , Patient's Chart, lab work & pertinent test results  Airway Mallampati: III TM Distance: >3 FB Neck ROM: Full    Dental no notable dental hx. (+) Teeth Intact and Dental Advisory Given   Pulmonary neg pulmonary ROS, former smoker,  breath sounds clear to auscultation  Pulmonary exam normal       Cardiovascular negative cardio ROS  Rhythm:Regular Rate:Normal     Neuro/Psych negative neurological ROS  negative psych ROS   GI/Hepatic negative GI ROS, Neg liver ROS,   Endo/Other  diabetes, Type 2, Oral Hypoglycemic AgentsMorbid obesity  Renal/GU negative Renal ROS  negative genitourinary   Musculoskeletal   Abdominal   Peds  Hematology negative hematology ROS (+)   Anesthesia Other Findings   Reproductive/Obstetrics negative OB ROS                           Anesthesia Physical Anesthesia Plan  ASA: III  Anesthesia Plan: MAC   Post-op Pain Management:    Induction: Intravenous  Airway Management Planned: Simple Face Mask  Additional Equipment:   Intra-op Plan:   Post-operative Plan: Extubation in OR  Informed Consent: I have reviewed the patients History and Physical, chart, labs and discussed the procedure including the risks, benefits and alternatives for the proposed anesthesia with the patient or authorized representative who has indicated his/her understanding and acceptance.   Dental advisory given  Plan Discussed with: CRNA  Anesthesia Plan Comments:         Anesthesia Quick Evaluation

## 2013-05-06 NOTE — Transfer of Care (Signed)
Immediate Anesthesia Transfer of Care Note  Patient: Tabitha [name]  Procedure(s) Performed: Procedure(s) with comments: RIGHT KNEE ARTHROSCOPY  (Right) - partial lateral minisectomy and chondroplasty  Patient Location: PACU  Anesthesia Type:MAC  Level of Consciousness: awake, alert  and oriented  Airway & Oxygen Therapy: Patient Spontanous Breathing and Patient connected to face mask oxygen  Post-op Assessment: Report given to PACU RN, Post -op Vital signs reviewed and stable and Patient moving all extremities  Post vital signs: Reviewed and stable  Complications: No apparent anesthesia complications

## 2013-05-06 NOTE — Interval H&P Note (Signed)
History and Physical Interval Note:  05/06/2013 7:25 AM  Tabitha [name]  has presented today for surgery, with the diagnosis of RIGHT MEDIAL MENISCAL TEAR AND CHONDROMALACIA   The various methods of treatment have been discussed with the patient and family. After consideration of risks, benefits and other options for treatment, the patient has consented to  Procedure(s): RIGHT KNEE ARTHROSCOPY  (Right) as a surgical intervention .  The patient's history has been reviewed, patient examined, no change in status, stable for surgery.  I have reviewed the patient's chart and labs.  Questions were answered to the patient's satisfaction.     Keily Lepp G

## 2013-05-06 NOTE — Op Note (Signed)
[id number] 

## 2013-05-07 ENCOUNTER — Encounter (HOSPITAL_BASED_OUTPATIENT_CLINIC_OR_DEPARTMENT_OTHER): Payer: Self-pay | Admitting: Orthopaedic Surgery

## 2013-05-14 ENCOUNTER — Ambulatory Visit: Payer: 59 | Admitting: Family Medicine

## 2013-05-26 ENCOUNTER — Telehealth: Payer: Self-pay | Admitting: *Deleted

## 2013-05-26 ENCOUNTER — Telehealth: Payer: Self-pay | Admitting: Internal Medicine

## 2013-05-26 NOTE — Telephone Encounter (Signed)
Called and left a message for [name] to give [place] a call back.

## 2013-05-26 NOTE — Telephone Encounter (Signed)
Herbert Seta  This pt is a diabetic and I have only seen her for one visit 05/2012.   I note she has been back to see her former primary at [facility] - I am confused  Please call pt and inquire who is going to be her primary care MD.  If it is me she needs to see me q3-4 months to care for her diabetes.   If I am her primary please schedule a 30 min OV  Message me back with her respose

## 2013-06-23 NOTE — Telephone Encounter (Signed)
I have called multiple times and left multiple messages since 05/26/13 for Tabitha [name] to call our office back.  I still have not gotten a response from her.

## 2013-08-16 ENCOUNTER — Encounter: Payer: 59 | Attending: Internal Medicine

## 2013-08-16 VITALS — Ht 62.0 in | Wt 265.6 lb

## 2013-08-16 DIAGNOSIS — E119 Type 2 diabetes mellitus without complications: Secondary | ICD-10-CM | POA: Insufficient documentation

## 2013-08-16 DIAGNOSIS — Z713 Dietary counseling and surveillance: Secondary | ICD-10-CM | POA: Insufficient documentation

## 2013-08-16 DIAGNOSIS — E0865 Diabetes mellitus due to underlying condition with hyperglycemia: Secondary | ICD-10-CM

## 2013-08-16 NOTE — Progress Notes (Signed)
Patient was seen on 08/16/13 for the complete diabetes self-management series at the [facility].  Current A1c = 8.8%  Handouts given during class include:  Living Well with Diabetes book  Carb Counting and Meal Planning book  Meal Plan Card  Carbohydrate guide  Meal planning worksheet  Low Sodium Flavoring Tips  The diabetes portion plate  Low Carbohydrate Snack Suggestions  A1c to eAG Conversion Chart  Diabetes Medications  Stress Management  Diabetes Recommended Care Schedule  Diabetes Success Plan  Core Class Satisfaction Survey  The following learning objectives were met by the patient during this course:  Describe diabetes  State some common risk factors for diabetes  Defines the role of glucose and insulin  Identifies type of diabetes and pathophysiology  Describe the relationship between diabetes and cardiovascular risk  State the members of the Healthcare Team  States the rationale for glucose monitoring  State when to test glucose  State their individual Target Range  State the importance of logging glucose readings  Describe how to interpret glucose readings  Identifies A1C target  Explain the correlation between A1c and eAG values  State symptoms and treatment of high blood glucose  State symptoms and treatment of low blood glucose  Explain proper technique for glucose testing  Identifies proper sharps disposal  Describe the role of different macronutrients on glucose  Explain how carbohydrates affect blood glucose  State what foods contain the most carbohydrates  Demonstrate carbohydrate counting  Demonstrate how to read Nutrition Facts food label  Describe effects of various fats on heart health  Describe the importance of good nutrition for health and healthy eating strategies  Describe techniques for managing your shopping, cooking and meal planning  List strategies to follow meal plan  when dining out  Describe the effects of alcohol on glucose and how to use it safely   State the amount of activity recommended for healthy living   Describe activities suitable for individual needs   Identify ways to regularly incorporate activity into daily life   Identify barriers to activity and ways to over come these barriers  Identify diabetes medications being personally used and their primary action for lowering glucose and possible side effects   Describe role of stress on blood glucose and develop strategies to address psychosocial issues   Identify diabetes complications and ways to prevent them  Explain how to manage diabetes during illness   Evaluate success in meeting personal goal   Establish 2-3 goals that they will plan to diligently work on until they return for the  29-month follow-up visit  Goals:  Follow Diabetes Meal Plan as instructed  Eat 3 meals and 2 snacks, every 3-5 hrs  Limit carbohydrate intake to 45 grams carbohydrate/meal Limit carbohydrate intake to 15 grams carbohydrate/snack Add lean protein foods to meals/snacks  Monitor glucose levels as instructed by your doctor  Aim for 15-30 mins of physical activity daily as tolerated  Bring food record and glucose log to your next nutrition visit  Your patient has established the following 4 month goals in their individualized success plan: I will count my carb choices at most meals and snacks I will increase my activity level at least 3 days a week  Your patient has identified these potential barriers to change:  Very busy, I don't plan meals well and don't enjoy exercise  Your patient has identified their diabetes self-care support plan as  My Fitness Pal to make myself more accountable  Make sure to grocery shop more

## 2013-09-29 ENCOUNTER — Emergency Department (HOSPITAL_COMMUNITY)
Admission: EM | Admit: 2013-09-29 | Discharge: 2013-09-29 | Disposition: A | Discharge status: Home / Self Care | Payer: 59 | Source: Home / Self Care | Attending: Family Medicine | Admitting: Family Medicine

## 2013-09-29 ENCOUNTER — Encounter (HOSPITAL_COMMUNITY): Payer: Self-pay | Admitting: Emergency Medicine

## 2013-09-29 DIAGNOSIS — M549 Dorsalgia, unspecified: Secondary | ICD-10-CM

## 2013-09-29 LAB — POCT URINALYSIS DIP (DEVICE)
Bilirubin Urine: NEGATIVE
Glucose, UA: NEGATIVE mg/dL
Hgb urine dipstick: NEGATIVE
Ketones, ur: NEGATIVE mg/dL
Leukocytes, UA: NEGATIVE
Nitrite: NEGATIVE
Protein, ur: NEGATIVE mg/dL
Specific Gravity, Urine: 1.025 (ref 1.005–1.030)
Urobilinogen, UA: 0.2 mg/dL (ref 0.0–1.0)
pH: 6 (ref 5.0–8.0)

## 2013-09-29 LAB — POCT I-STAT, CHEM 8
BUN: 14 mg/dL (ref 6–23)
Calcium, Ion: 1.21 mmol/L (ref 1.12–1.23)
Chloride: 103 mEq/L (ref 96–112)
Creatinine, Ser: 0.7 mg/dL (ref 0.50–1.10)
Glucose, Bld: 112 mg/dL — ABNORMAL HIGH (ref 70–99)
HCT: 44 % (ref 36.0–46.0)
Hemoglobin: 15 g/dL (ref 12.0–15.0)
Potassium: 4.1 mEq/L (ref 3.7–5.3)
Sodium: 141 mEq/L (ref 137–147)
TCO2: 28 mmol/L (ref 0–100)

## 2013-09-29 LAB — POCT PREGNANCY, URINE: Preg Test, Ur: NEGATIVE

## 2013-09-29 NOTE — Discharge Instructions (Signed)
Back Pain, Adult Low back pain is very common. About 1 in 5 people have back pain.The cause of low back pain is rarely dangerous. The pain often gets better over time.About half of people with a sudden onset of back pain feel better in just 2 weeks. About 8 in 10 people feel better by 6 weeks.  CAUSES Some common causes of back pain include:  Strain of the muscles or ligaments supporting the spine.  Wear and tear (degeneration) of the spinal discs.  Arthritis.  Direct injury to the back. DIAGNOSIS Most of the time, the direct cause of low back pain is not known.However, back pain can be treated effectively even when the exact cause of the pain is unknown.Answering your caregiver's questions about your overall health and symptoms is one of the most accurate ways to make sure the cause of your pain is not dangerous. If your caregiver needs more information, he or she may order lab work or imaging tests (X-rays or MRIs).However, even if imaging tests show changes in your back, this usually does not require surgery. HOME CARE INSTRUCTIONS For many people, back pain returns.Since low back pain is rarely dangerous, it is often a condition that people can learn to [facility] their own.   Remain active. It is stressful on the back to sit or stand in one place. Do not sit, drive, or stand in one place for more than 30 minutes at a time. Take short walks on level surfaces as soon as pain allows.Try to increase the length of time you walk each day.  Do not stay in bed.Resting more than 1 or 2 days can delay your recovery.  Do not avoid exercise or work.Your body is made to move.It is not dangerous to be active, even though your back may hurt.Your back will likely heal faster if you return to being active before your pain is gone.  Pay attention to your body when you bend and lift. Many people have less discomfortwhen lifting if they bend their knees, keep the load close to their bodies,and  avoid twisting. Often, the most comfortable positions are those that put less stress on your recovering back.  Find a comfortable position to sleep. Use a firm mattress and lie on your side with your knees slightly bent. If you lie on your back, put a pillow under your knees.  Only take over-the-counter or prescription medicines as directed by your caregiver. Over-the-counter medicines to reduce pain and inflammation are often the most helpful.Your caregiver may prescribe muscle relaxant drugs.These medicines help dull your pain so you can more quickly return to your normal activities and healthy exercise.  Put ice on the injured area.  Put ice in a plastic bag.  Place a towel between your skin and the bag.  Leave the ice on for 15-20 minutes, 03-04 times a day for the first 2 to 3 days. After that, ice and heat may be alternated to reduce pain and spasms.  Ask your caregiver about trying back exercises and gentle massage. This may be of some benefit.  Avoid feeling anxious or stressed.Stress increases muscle tension and can worsen back pain.It is important to recognize when you are anxious or stressed and learn ways to manage it.Exercise is a great option. SEEK MEDICAL CARE IF:  You have pain that is not relieved with rest or medicine.  You have pain that does not improve in 1 week.  You have new symptoms.  You are generally not feeling well. SEEK  IMMEDIATE MEDICAL CARE IF:   You have pain that radiates from your back into your legs.  You develop new bowel or bladder control problems.  You have unusual weakness or numbness in your arms or legs.  You develop nausea or vomiting.  You develop abdominal pain.  You feel faint. Document Released: 05/29/2005 Document Revised: 11/28/2011 Document Reviewed: 10/17/2010 ExitCare Patient Information 2014 [employer]. Back Exercises Back exercises help treat and prevent back injuries. The goal of back exercises is to increase  the strength of your abdominal and back muscles and the flexibility of your back. These exercises should be started when you no longer have back pain. Back exercises include:  Pelvic Tilt. Lie on your back with your knees bent. Tilt your pelvis until the lower part of your back is against the floor. Hold this position 5 to 10 sec and repeat 5 to 10 times.  Knee to Chest. Pull first 1 knee up against your chest and hold for 20 to 30 seconds, repeat this with the other knee, and then both knees. This may be done with the other leg straight or bent, whichever feels better.  Sit-Ups or Curl-Ups. Bend your knees 90 degrees. Start with tilting your pelvis, and do a partial, slow sit-up, lifting your trunk only 30 to 45 degrees off the floor. Take at least 2 to 3 seconds for each sit-up. Do not do sit-ups with your knees out straight. If partial sit-ups are difficult, simply do the above but with only tightening your abdominal muscles and holding it as directed.  Hip-Lift. Lie on your back with your knees flexed 90 degrees. Push down with your feet and shoulders as you raise your hips a couple inches off the floor; hold for 10 seconds, repeat 5 to 10 times.  Back arches. Lie on your stomach, propping yourself up on bent elbows. Slowly press on your hands, causing an arch in your low back. Repeat 3 to 5 times. Any initial stiffness and discomfort should lessen with repetition over time.  Shoulder-Lifts. Lie face down with arms beside your body. Keep hips and torso pressed to floor as you slowly lift your head and shoulders off the floor. Do not overdo your exercises, especially in the beginning. Exercises may cause you some mild back discomfort which lasts for a few minutes; however, if the pain is more severe, or lasts for more than 15 minutes, do not continue exercises until you see your caregiver. Improvement with exercise therapy for back problems is slow.  See your caregivers for assistance with  developing a proper back exercise program. Document Released: 07/06/2004 Document Revised: 08/21/2011 Document Reviewed: 03/30/2011 ExitCare Patient Information 2014 [employer].  

## 2013-09-29 NOTE — ED Provider Notes (Signed)
CSN: [id number]     Arrival date & time 09/29/13  1011 History   First MD Initiated Contact with Patient 09/29/13 1108     Chief Complaint  Patient presents with  . Back Pain   (Consider location/radiation/quality/duration/timing/severity/associated sxs/prior Treatment) HPI Comments: 48 year old female presents complaining of back pain. For about 5 days, she has had pain in the right side of her lower back and hip. The pain is constant and is made worse by particular movements such as bending at the waist or getting up out of her car from a seated position. The pain is not relieved by tramadol, she wants to make sure she does not have a kidney stone. She has no history of kidney stones but does have a history of having had crystals in her urine on a urinalysis.. The pain is nonradiating. It is not affected by eating, bowel movements, urination, or deep breathing. No pelvic pain or vaginal discharge. She has had some nausea, but this is chronic, often occurring after she takes metformin. She denies fever, chills, vomiting, diarrhea, hematuria, dysuria. She states she feels like her urine may be more concentrated than usual.  Patient is a 48 y.o. female presenting with back pain.  Back Pain Associated symptoms: no abdominal pain, no dysuria and no fever     Past Medical History  Diagnosis Date  . Dermatophytosis, scalp   . De Quervain's disease (tenosynovitis)   . Hypercholesteremia   . Polycystic ovarian disease   . Obesity   . Diabetes mellitus   . Wears glasses   . Arthritis   . Psoriatic arthritis    Past Surgical History  Procedure Laterality Date  . Wrist surgery  2001    carpel tunnel -rt  . De quervain's release  2012    left  . Wisdom tooth extraction    . Colonoscopy    . Knee arthroscopy Right 05/06/2013    Procedure: RIGHT KNEE ARTHROSCOPY ;  Surgeon: Hessie Dibble, MD;  Location: [facility];  Service: Orthopedics;  Laterality: Right;  partial lateral  minisectomy and chondroplasty   Family History  Problem Relation Age of Onset  . Hypertension    . Diabetes type II    . Obesity    . Diabetes Father   . Hypertension Father   . Stroke Father   . Kidney disease Father   . Cancer Mother 33    breast ca died at age 45  . Obesity Brother   . Hypertension Brother   . Heart disease Maternal Grandmother   . Alzheimer's disease Maternal Grandmother   . Diabetes Paternal Grandmother   . Heart disease Paternal Grandfather    History  Substance Use Topics  . Smoking status: Former Smoker -- 1.30 packs/day    Types: Cigarettes    Start date: 06/12/1984    Quit date: 03/18/1995  . Smokeless tobacco: Never Used     Comment: Not interested in returning to smoking.   . Alcohol Use: 3.5 oz/week    7 drink(s) per week     Comment: a martini a night   OB History   Grav Para Term Preterm Abortions TAB SAB Ect Mult Living                 Review of Systems  Constitutional: Negative for fever and chills.  Gastrointestinal: Positive for nausea. Negative for vomiting, abdominal pain and diarrhea.  Genitourinary: Negative for dysuria and hematuria.  Musculoskeletal: Positive for back pain.  See history of present illness  All other systems reviewed and are negative.   Allergies  Chocolate; Food; Statins; and Latex  Home Medications   Prior to Admission medications   Medication Sig Start Date End Date Taking? Authorizing Provider  clobetasol (OLUX) 0.05 % topical foam Apply topically daily as needed. 03/12/12   Zigmund Gottron, MD  folic acid (FOLVITE) 1 MG tablet Take 1 tablet (1 mg total) by mouth 2 (two) times daily. 03/12/12   Zigmund Gottron, MD  HYDROcodone-acetaminophen (NORCO) 5-325 MG per tablet Take 1-2 tablets by mouth every 6 (six) hours as needed for moderate pain. 05/06/13   Hessie Dibble, MD  Liraglutide (VICTOZA) 18 MG/3ML SOPN Inject into the skin.    Historical Provider, MD  metFORMIN (GLUCOPHAGE)  1000 MG tablet Take 1,000 mg by mouth 2 (two) times daily with a meal. 05/29/12   Lanice Shirts, MD  Methotrexate Sodium, PF, 50 MG/2ML SOLN Inject 7.5 mg as directed once a week. On Friday 03/12/12   Zigmund Gottron, MD  Omega-3 Fatty Acids (FISH OIL) 1200 MG CAPS Take 1-2 capsules (1,200-2,400 mg total) by mouth 2 (two) times daily. Takes 2400 mg qam and 1200 mg qpm 03/12/12   Zigmund Gottron, MD  oxymetazoline (AFRIN) 0.05 % nasal spray Place 2 sprays into the nose daily as needed.    Historical Provider, MD  TraMADol HCl 50 MG TBDP Take 1 tablet by mouth every 8 (eight) hours as needed. 03/30/11   Lyndal Pulley, DO   BP 153/73  Pulse 78  Temp(Src) 98.4 F (36.9 C) (Oral)  Resp 18  SpO2 99% Physical Exam  Nursing note and vitals reviewed. Constitutional: She is oriented to person, place, and time. Vital signs are normal. She appears well-developed and well-nourished. No distress.  HENT:  Head: Normocephalic and atraumatic.  Pulmonary/Chest: Effort normal. No respiratory distress.  Abdominal: There is no CVA tenderness.  Musculoskeletal:       Back:  Neurological: She is alert and oriented to person, place, and time. She has normal strength. Coordination normal.  Skin: Skin is warm and dry. No rash noted. She is not diaphoretic.  Psychiatric: She has a normal mood and affect. Judgment normal.    ED Course  Procedures (including critical care time) Labs Review Labs Reviewed  POCT I-STAT, CHEM 8 - Abnormal; Notable for the following:    Glucose, Bld 112 (*)    All other components within normal limits  POCT URINALYSIS DIP (DEVICE)  POCT PREGNANCY, URINE    Results for orders placed during the hospital encounter of 09/29/13  POCT URINALYSIS DIP (DEVICE)      Result Value Ref Range   Glucose, UA NEGATIVE  NEGATIVE mg/dL   Bilirubin Urine NEGATIVE  NEGATIVE   Ketones, ur NEGATIVE  NEGATIVE mg/dL   Specific Gravity, Urine 1.025  1.005 - 1.030   Hgb urine  dipstick NEGATIVE  NEGATIVE   pH 6.0  5.0 - 8.0   Protein, ur NEGATIVE  NEGATIVE mg/dL   Urobilinogen, UA 0.2  0.0 - 1.0 mg/dL   Nitrite NEGATIVE  NEGATIVE   Leukocytes, UA NEGATIVE  NEGATIVE  POCT PREGNANCY, URINE      Result Value Ref Range   Preg Test, Ur NEGATIVE  NEGATIVE  POCT I-STAT, CHEM 8      Result Value Ref Range   Sodium 141  137 - 147 mEq/L   Potassium 4.1  3.7 - 5.3 mEq/L   Chloride 103  96 - 112 mEq/L   BUN 14  6 - 23 mg/dL   Creatinine, Ser 0.70  0.50 - 1.10 mg/dL   Glucose, Bld 112 (*) 70 - 99 mg/dL   Calcium, Ion 1.21  1.12 - 1.23 mmol/L   TCO2 28  0 - 100 mmol/L   Hemoglobin 15.0  12.0 - 15.0 g/dL   HCT 44.0  36.0 - 46.0 %   Imaging Review No results found.   MDM   1. Back pain    Urinalysis and i-STAT normal, tenderness is on the PSIS. No injury. Continue when necessary tramadol, followup if worsening       Liam Graham, PA-C 09/29/13 1152

## 2013-09-29 NOTE — ED Notes (Signed)
Pt c/o back pain on right side onset 5 days Denies inj/trauma, urinary sx, gyn sx, f/v/d Has been feeling nauseas but not sure if it's due to current meds Alert w/no signs of acute distress; steady gait.

## 2013-10-01 NOTE — ED Provider Notes (Signed)
Medical screening examination/treatment/procedure(s) were performed by a resident physician or non-physician practitioner and as the supervising physician I was immediately available for consultation/collaboration.  Evan Corey, MD    Evan S Corey, MD 10/01/13 1324 

## 2013-10-03 ENCOUNTER — Ambulatory Visit (INDEPENDENT_AMBULATORY_CARE_PROVIDER_SITE_OTHER): Payer: 59 | Admitting: Family Medicine

## 2013-10-03 VITALS — BP 124/86 | HR 96 | Wt 266.0 lb

## 2013-10-03 DIAGNOSIS — M533 Sacrococcygeal disorders, not elsewhere classified: Secondary | ICD-10-CM

## 2013-10-03 DIAGNOSIS — M549 Dorsalgia, unspecified: Secondary | ICD-10-CM

## 2013-10-03 DIAGNOSIS — M999 Biomechanical lesion, unspecified: Secondary | ICD-10-CM

## 2013-10-03 MED ORDER — KETOROLAC TROMETHAMINE 60 MG/2ML IM SOLN
60.0000 mg | Freq: Once | INTRAMUSCULAR | Status: AC
  Administered 2013-10-03: 60 mg via INTRAMUSCULAR

## 2013-10-03 NOTE — Patient Instructions (Addendum)
Good to see you as always.  Please tell everyone hi.  exercises below. Try 4 times weekly.  Sacroiliac Joint Mobilization and Rehab 1. Work on pretzel stretching, shoulder back and leg draped in front. 3-5 sets, 30 sec.. 2. hip abductor rotations. standing, hip flexion and rotation outward then inward. 3 sets, 15 reps. when can do comfortably, add ankle weights starting at 2 pounds.  3. cross over stretching - shoulder back to ground, same side leg crossover. 3-5 sets for 30 min..  4. rolling up and back knees to chest and rocking. 5. sacral tilt - 5 sets, hold for 5-10 seconds  Ice 20 minutes 2 times a day Vitamin D 2000 IU daily.  Turmeric 500mg  twice daily.  Come back in 2 weeks.

## 2013-10-04 ENCOUNTER — Encounter: Payer: Self-pay | Admitting: Family Medicine

## 2013-10-04 DIAGNOSIS — M999 Biomechanical lesion, unspecified: Secondary | ICD-10-CM | POA: Insufficient documentation

## 2013-10-04 DIAGNOSIS — M533 Sacrococcygeal disorders, not elsewhere classified: Secondary | ICD-10-CM | POA: Insufficient documentation

## 2013-10-04 NOTE — Assessment & Plan Note (Signed)
Patient is a sacroiliac joint dysfunction and was given home exercise program, icing protocol we discussed over-the-counter anti-inflammatories he could be beneficial. We discussed topical medications as well as to be helpful. Patient will try these interventions and come back again in 2-3 weeks for further evaluation.

## 2013-10-04 NOTE — Progress Notes (Signed)
  [name] [address] [place], [place] [address] Phone: [phone] Subjective:     CC: Back pain  [id number] Tabitha [name] is a 48 y.o. female coming in with complaint of back pain. Patient states that this started approximately 1 week ago. Patient was doing a lot of activity around the house and started having pain. Patient states is mostly on the right side. Patient actually had to leave work early and went to urgent care initially. Patient was diagnosed with more of a muscle strain and sent home with muscle relaxers and anti-inflammatories. Patient states everyday it seems to begin better but still hard to do activities of daily living. Patient has been out of work to time secondary to the amount of pain. Patient describes it now as a pain that hurts more when she any rotation occurs. Patient denies any fevers or chills or any abnormal weight loss. Patient does have a past medical history significant for psoriatic arthritis. Patient states that the pain is 5/10. Patient has responded past osteopathic manipulation.    Past medical history, social, surgical and family history all reviewed in electronic medical record.   Review of Systems: No headache, visual changes, nausea, vomiting, diarrhea, constipation, dizziness, abdominal pain, skin rash, fevers, chills, night sweats, weight loss, swollen lymph nodes, body aches, joint swelling, muscle aches, chest pain, shortness of breath, mood changes.   Objective Blood pressure 124/86, pulse 96, weight 266 lb (120.657 kg), SpO2 97.00%.  General: No apparent distress alert and oriented x3 mood and affect normal, dressed appropriately.  HEENT: Pupils equal, extraocular movements intact  Respiratory: Patient's speak in full sentences and does not appear short of breath  Cardiovascular: No lower extremity edema, non tender, no erythema  Skin: Warm dry intact with no signs of infection or rash on extremities or on  axial skeleton.  Abdomen: Soft nontender  Neuro: Cranial nerves II through XII are intact, neurovascularly intact in all extremities with 2+ DTRs and 2+ pulses.  Lymph: No lymphadenopathy of posterior or anterior cervical chain or axillae bilaterally.  Gait normal with good balance and coordination.  MSK:  Non tender with full range of motion and good stability and symmetric strength and tone of shoulders, elbows, wrist, hip, knee and ankles bilaterally.  Back Exam:  Inspection: Unremarkable  Motion: Flexion 35 deg, Extension 45 deg, Side Bending to 35 deg bilaterally,  Rotation to 35 deg bilaterally  SLR laying: Negative  XSLR laying: Negative  Palpable tenderness: Tender to palpation over the paraspinal musculature of the lumbar spine and the right sacroiliac joint. FABER: Positive right Sensory change: Gross sensation intact to all lumbar and sacral dermatomes.  Reflexes: 2+ at both patellar tendons, 2+ at achilles tendons, Babinski's downgoing.  Strength at foot  Plantar-flexion: 5/5 Dorsi-flexion: 5/5 Eversion: 5/5 Inversion: 5/5  Leg strength  Quad: 5/5 Hamstring: 5/5 Hip flexor: 5/5 Hip abductors: 4/5  Gait unremarkable.  OMT Physical Exam  Standing flexion right  Seated Flexion right  Cervical  C4 flexed rotated and side bent right  Thoracic T3 extended rotated and side bent left  Lumbar L2 flexed rotated inside that right  Sacrum Right right [place]       Impression and Recommendations:     This case required medical decision making of moderate complexity.

## 2013-10-04 NOTE — Assessment & Plan Note (Signed)
Decision today to treat with OMT was based on Physical Exam  After verbal consent patient was treated with HVLA, ME techniques in cervical, thoracic, lumbar and sacral areas  Patient tolerated the procedure well with improvement in symptoms  Patient given exercises, stretches and lifestyle modifications  See medications in patient instructions if given  Patient will follow up in 2-3 weeks  

## 2013-10-17 ENCOUNTER — Ambulatory Visit: Payer: 59 | Admitting: Family Medicine

## 2013-12-16 ENCOUNTER — Encounter: Payer: 59 | Attending: Internal Medicine | Admitting: *Deleted

## 2013-12-16 DIAGNOSIS — E1369 Other specified diabetes mellitus with other specified complication: Secondary | ICD-10-CM | POA: Insufficient documentation

## 2013-12-16 DIAGNOSIS — E0865 Diabetes mellitus due to underlying condition with hyperglycemia: Secondary | ICD-10-CM

## 2013-12-16 DIAGNOSIS — Z713 Dietary counseling and surveillance: Secondary | ICD-10-CM | POA: Insufficient documentation

## 2013-12-16 NOTE — Progress Notes (Signed)
  Patient was seen on 12/16/13 for her 4 month follow-up as a part of the diabetes self-management courses at [facility].  Patient self reports the following:  Confidence in ability to manage T2DM: As a result of the education received at [facility] feels well equipment with the tools, resources and support to manage her T2DM.  Glucose: A1c 6.5% 07/26/2013    Down from 8.8%                 FBS avg. 130mg /dl                 Random glucose range 100-110mg /dl  Meal Plan: Has not been following plan for past couple of months due to family stressors  Social: Tabitha [name] works 2 jobs at present. 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 She makes frequent trips to [place].  Exercise: None at present due to family responsibilities. Previously she was attending [employer] and exercising in the water. She is looking into another location due to expense. She is looking into utilizing the Trainer now available through the [facility]: * Attend support group * follow up PRN

## 2014-06-23 ENCOUNTER — Other Ambulatory Visit (HOSPITAL_COMMUNITY): Payer: Self-pay | Admitting: *Deleted

## 2014-06-24 ENCOUNTER — Ambulatory Visit (HOSPITAL_COMMUNITY)
Admission: RE | Admit: 2014-06-24 | Discharge: 2014-06-24 | Disposition: A | Discharge status: Home / Self Care | Payer: 59 | Source: Ambulatory Visit | Attending: Rheumatology | Admitting: Rheumatology

## 2014-06-24 DIAGNOSIS — L4050 Arthropathic psoriasis, unspecified: Secondary | ICD-10-CM | POA: Insufficient documentation

## 2014-06-24 MED ORDER — SODIUM CHLORIDE 0.9 % IV SOLN
600.0000 mg | INTRAVENOUS | Status: DC
  Administered 2014-06-24: 600 mg via INTRAVENOUS
  Filled 2014-06-24: qty 60

## 2014-06-24 MED ORDER — SODIUM CHLORIDE 0.9 % IV SOLN
INTRAVENOUS | Status: DC
  Administered 2014-06-24: 10:00:00 via INTRAVENOUS

## 2014-07-14 ENCOUNTER — Ambulatory Visit (INDEPENDENT_AMBULATORY_CARE_PROVIDER_SITE_OTHER): Payer: 59 | Admitting: Internal Medicine

## 2014-07-14 ENCOUNTER — Encounter: Payer: Self-pay | Admitting: Internal Medicine

## 2014-07-14 VITALS — BP 116/66 | HR 84 | Temp 99.0°F | Resp 18 | Ht 63.0 in | Wt 271.1 lb

## 2014-07-14 DIAGNOSIS — E0865 Diabetes mellitus due to underlying condition with hyperglycemia: Secondary | ICD-10-CM | POA: Diagnosis not present

## 2014-07-14 DIAGNOSIS — E669 Obesity, unspecified: Secondary | ICD-10-CM | POA: Diagnosis not present

## 2014-07-14 DIAGNOSIS — E1165 Type 2 diabetes mellitus with hyperglycemia: Secondary | ICD-10-CM | POA: Diagnosis not present

## 2014-07-14 DIAGNOSIS — L4050 Arthropathic psoriasis, unspecified: Secondary | ICD-10-CM

## 2014-07-14 NOTE — Progress Notes (Signed)
Pre visit review using our clinic review tool, if applicable. No additional management support is needed unless otherwise documented below in the visit note. 

## 2014-07-14 NOTE — Patient Instructions (Addendum)
We will not check your blood work today but will have you come back in June or July for blood work.   The medicine for weight loss that we spoke about today is called belviq (also called lorcaserin) and I have given you more information about it to review. If you are interested in starting this medicine just call our office and let us know.   Exercise to Stay Healthy Exercise helps you become and stay healthy. EXERCISE IDEAS AND TIPS Choose exercises that:  You enjoy.  Fit into your day. You do not need to exercise really hard to be healthy. You can do exercises at a slow or medium level and stay healthy. You can:  Stretch before and after working out.  Try yoga, Pilates, or tai chi.  Lift weights.  Walk fast, swim, jog, run, climb stairs, bicycle, dance, or rollerskate.  Take aerobic classes. Exercises that burn about 150 calories:  Running 1  miles in 15 minutes.  Playing volleyball for 45 to 60 minutes.  Washing and waxing a car for 45 to 60 minutes.  Playing touch football for 45 minutes.  Walking 1  miles in 35 minutes.  Pushing a stroller 1  miles in 30 minutes.  Playing basketball for 30 minutes.  Raking leaves for 30 minutes.  Bicycling 5 miles in 30 minutes.  Walking 2 miles in 30 minutes.  Dancing for 30 minutes.  Shoveling snow for 15 minutes.  Swimming laps for 20 minutes.  Walking up stairs for 15 minutes.  Bicycling 4 miles in 15 minutes.  Gardening for 30 to 45 minutes.  Jumping rope for 15 minutes.  Washing windows or floors for 45 to 60 minutes. Document Released: 07/01/2010 Document Revised: 08/21/2011 Document Reviewed: 07/01/2010 [facility] Patient Information 2015 [place], [place]. This information is not intended to replace advice given to you by your health care provider. Make sure you discuss any questions you have with your health care provider.

## 2014-07-14 NOTE — Progress Notes (Signed)
   Subjective:    Patient ID: Tabitha [name], female    DOB: Feb 25, 1966, 49 y.o.   MRN: [id number]  HPI The patient is a 49 YO female who is new and coming in to follow up on her diabetes. She has been taking her victoza and metfromin without problems. She denies associated hypoglycemia. She has had diabetes for several years and it was the results of many courses of steroids for her joint. She has her HgA1c checked at it was 6.9 2 weeks ago. She is not always able to exercise much due to her joints. She does have psoriatic arthritis (on immunotherapy now which is helping some). She is in the process of a separation with her husband and she has spent a lot of her life caring for others. She is seeing this as a time when she can put herself and her health first.   PMH, [place], [place], PSH, allergies, medications, problem list reviewed and updated.   Review of Systems  Constitutional: Negative for fever, activity change, appetite change, fatigue and unexpected weight change.  HENT: Negative.   Eyes: Negative.   Respiratory: Negative for cough, chest tightness, shortness of breath and wheezing.   Cardiovascular: Negative for chest pain, palpitations and leg swelling.  Gastrointestinal: Negative for abdominal pain, diarrhea, constipation and abdominal distention.  Musculoskeletal: Positive for myalgias and arthralgias.  Skin: Negative.   Neurological: Negative.   Psychiatric/Behavioral: Positive for dysphoric mood. Negative for suicidal ideas, confusion, sleep disturbance, self-injury and decreased concentration. The patient is not nervous/anxious.       Objective:   Physical Exam  Constitutional: She is oriented to person, place, and time. She appears well-developed and well-nourished.  Overweight  HENT:  Head: Normocephalic and atraumatic.  Eyes: EOM are normal.  Neck: Normal range of motion.  Cardiovascular: Normal rate and regular rhythm.   Pulmonary/Chest: Effort normal and breath sounds normal.  No respiratory distress. She has no wheezes. She has no rales.  Abdominal: Soft. Bowel sounds are normal. She exhibits no distension. There is no tenderness.  Musculoskeletal: She exhibits tenderness.  Several joints tender  Neurological: She is alert and oriented to person, place, and time. Coordination normal.  Skin: Skin is warm and dry.   Filed Vitals:   07/14/14 1542  BP: 116/66  Pulse: 84  Temp: 99 F (37.2 C)  TempSrc: Oral  Resp: 18  Height: 5\' 3"  (1.6 m)  Weight: 271 lb 1.3 oz (122.961 kg)  SpO2: 96%      Assessment & Plan:

## 2014-07-17 ENCOUNTER — Encounter: Payer: Self-pay | Admitting: Internal Medicine

## 2014-07-17 NOTE — Assessment & Plan Note (Signed)
She does follow with rheumatology and is currently not well controlled. This does limit her activity which is not helping her weight.

## 2014-07-17 NOTE — Assessment & Plan Note (Addendum)
She is ready to start making changes to her diet and trying to lose some weight. Encouraged her in this goal. We also discussed weight loss medications including belviq. She will think about whether she wants to try this medicine.

## 2014-07-17 NOTE — Assessment & Plan Note (Signed)
She would like to start with diet and exercise to lose some weight now that she has time for her health. HgA1c 6.9 2 weeks ago so will not check today. Foot exam current.

## 2014-07-21 ENCOUNTER — Encounter: Payer: Self-pay | Admitting: Internal Medicine

## 2014-07-22 ENCOUNTER — Other Ambulatory Visit: Payer: Self-pay | Admitting: Geriatric Medicine

## 2014-07-22 MED ORDER — METFORMIN HCL 1000 MG PO TABS: 1000.0000 mg | ORAL_TABLET | Freq: Two times a day (BID) | ORAL | Status: DC

## 2014-07-22 MED ORDER — LIRAGLUTIDE 18 MG/3ML ~~LOC~~ SOPN: 1.8000 mg | PEN_INJECTOR | Freq: Every day | SUBCUTANEOUS | Status: DC

## 2014-08-03 ENCOUNTER — Other Ambulatory Visit (HOSPITAL_COMMUNITY): Payer: Self-pay | Admitting: *Deleted

## 2014-08-05 ENCOUNTER — Encounter (HOSPITAL_COMMUNITY)
Admission: RE | Admit: 2014-08-05 | Discharge: 2014-08-05 | Disposition: A | Discharge status: Home / Self Care | Payer: 59 | Source: Ambulatory Visit | Attending: Rheumatology | Admitting: Rheumatology

## 2014-08-05 DIAGNOSIS — Z79899 Other long term (current) drug therapy: Secondary | ICD-10-CM | POA: Diagnosis not present

## 2014-08-05 DIAGNOSIS — L4050 Arthropathic psoriasis, unspecified: Secondary | ICD-10-CM | POA: Insufficient documentation

## 2014-08-05 LAB — CBC WITH DIFFERENTIAL/PLATELET
Basophils Absolute: 0 10*3/uL (ref 0.0–0.1)
Basophils Relative: 1 % (ref 0–1)
Eosinophils Absolute: 0.2 10*3/uL (ref 0.0–0.7)
Eosinophils Relative: 2 % (ref 0–5)
HCT: 39.8 % (ref 36.0–46.0)
Hemoglobin: 13.3 g/dL (ref 12.0–15.0)
Lymphocytes Relative: 25 % (ref 12–46)
Lymphs Abs: 2.1 10*3/uL (ref 0.7–4.0)
MCH: 30 pg (ref 26.0–34.0)
MCHC: 33.4 g/dL (ref 30.0–36.0)
MCV: 89.8 fL (ref 78.0–100.0)
Monocytes Absolute: 0.7 10*3/uL (ref 0.1–1.0)
Monocytes Relative: 8 % (ref 3–12)
Neutro Abs: 5.4 10*3/uL (ref 1.7–7.7)
Neutrophils Relative %: 64 % (ref 43–77)
Platelets: 204 10*3/uL (ref 150–400)
RBC: 4.43 MIL/uL (ref 3.87–5.11)
RDW: 13 % (ref 11.5–15.5)
WBC: 8.3 10*3/uL (ref 4.0–10.5)

## 2014-08-05 LAB — COMPREHENSIVE METABOLIC PANEL
ALT: 62 U/L — ABNORMAL HIGH (ref 0–35)
AST: 36 U/L (ref 0–37)
Albumin: 3.5 g/dL (ref 3.5–5.2)
Alkaline Phosphatase: 80 U/L (ref 39–117)
Anion gap: 9 (ref 5–15)
BUN: 16 mg/dL (ref 6–23)
CO2: 25 mmol/L (ref 19–32)
Calcium: 9.2 mg/dL (ref 8.4–10.5)
Chloride: 104 mmol/L (ref 96–112)
Creatinine, Ser: 0.74 mg/dL (ref 0.50–1.10)
GFR calc Af Amer: >90 mL/min (ref >90)
GFR calc non Af Amer: >90 mL/min (ref >90)
Glucose, Bld: 221 mg/dL — ABNORMAL HIGH (ref 70–99)
Potassium: 4.4 mmol/L (ref 3.5–5.1)
Sodium: 138 mmol/L (ref 135–145)
Total Bilirubin: 0.8 mg/dL (ref 0.3–1.2)
Total Protein: 7.1 g/dL (ref 6.0–8.3)

## 2014-08-05 MED ORDER — SODIUM CHLORIDE 0.9 % IV SOLN
600.0000 mg | INTRAVENOUS | Status: DC
  Administered 2014-08-05: 600 mg via INTRAVENOUS
  Filled 2014-08-05: qty 60

## 2014-08-05 MED ORDER — SODIUM CHLORIDE 0.9 % IV SOLN
INTRAVENOUS | Status: DC
  Administered 2014-08-05: 10:00:00 via INTRAVENOUS

## 2014-09-15 ENCOUNTER — Other Ambulatory Visit (HOSPITAL_COMMUNITY): Payer: Self-pay | Admitting: *Deleted

## 2014-09-16 ENCOUNTER — Ambulatory Visit (HOSPITAL_COMMUNITY)
Admission: RE | Admit: 2014-09-16 | Discharge: 2014-09-16 | Disposition: A | Discharge status: Home / Self Care | Payer: 59 | Source: Ambulatory Visit | Attending: Rheumatology | Admitting: Rheumatology

## 2014-09-16 DIAGNOSIS — L4050 Arthropathic psoriasis, unspecified: Secondary | ICD-10-CM | POA: Insufficient documentation

## 2014-09-16 MED ORDER — SODIUM CHLORIDE 0.9 % IV SOLN
INTRAVENOUS | Status: DC
  Administered 2014-09-16: 250 mL via INTRAVENOUS

## 2014-09-16 MED ORDER — SODIUM CHLORIDE 0.9 % IV SOLN
600.0000 mg | INTRAVENOUS | Status: DC
  Administered 2014-09-16: 600 mg via INTRAVENOUS
  Filled 2014-09-16: qty 60

## 2014-10-28 ENCOUNTER — Other Ambulatory Visit (HOSPITAL_COMMUNITY): Payer: Self-pay

## 2014-10-29 ENCOUNTER — Ambulatory Visit (HOSPITAL_COMMUNITY)
Admission: RE | Admit: 2014-10-29 | Discharge: 2014-10-29 | Disposition: A | Discharge status: Home / Self Care | Payer: 59 | Source: Ambulatory Visit | Attending: Rheumatology | Admitting: Rheumatology

## 2014-10-29 DIAGNOSIS — L4050 Arthropathic psoriasis, unspecified: Secondary | ICD-10-CM | POA: Insufficient documentation

## 2014-10-29 LAB — CBC
HCT: 41 % (ref 36.0–46.0)
Hemoglobin: 13.6 g/dL (ref 12.0–15.0)
MCH: 29.4 pg (ref 26.0–34.0)
MCHC: 33.2 g/dL (ref 30.0–36.0)
MCV: 88.7 fL (ref 78.0–100.0)
Platelets: 199 10*3/uL (ref 150–400)
RBC: 4.62 MIL/uL (ref 3.87–5.11)
RDW: 13 % (ref 11.5–15.5)
WBC: 6.8 10*3/uL (ref 4.0–10.5)

## 2014-10-29 LAB — COMPREHENSIVE METABOLIC PANEL
ALT: 92 U/L — ABNORMAL HIGH (ref 14–54)
AST: 51 U/L — ABNORMAL HIGH (ref 15–41)
Albumin: 3.4 g/dL — ABNORMAL LOW (ref 3.5–5.0)
Alkaline Phosphatase: 74 U/L (ref 38–126)
Anion gap: 10 (ref 5–15)
BUN: 10 mg/dL (ref 6–20)
CO2: 24 mmol/L (ref 22–32)
Calcium: 8.9 mg/dL (ref 8.9–10.3)
Chloride: 104 mmol/L (ref 101–111)
Creatinine, Ser: 0.67 mg/dL (ref 0.44–1.00)
GFR calc Af Amer: >60 mL/min (ref >60)
GFR calc non Af Amer: >60 mL/min (ref >60)
Glucose, Bld: 188 mg/dL — ABNORMAL HIGH (ref 65–99)
Potassium: 4.2 mmol/L (ref 3.5–5.1)
Sodium: 138 mmol/L (ref 135–145)
Total Bilirubin: 0.6 mg/dL (ref 0.3–1.2)
Total Protein: 6.3 g/dL — ABNORMAL LOW (ref 6.5–8.1)

## 2014-10-29 LAB — DIFFERENTIAL
Basophils Absolute: 0 10*3/uL (ref 0.0–0.1)
Basophils Relative: 0 % (ref 0–1)
Eosinophils Absolute: 0.2 10*3/uL (ref 0.0–0.7)
Eosinophils Relative: 3 % (ref 0–5)
Lymphocytes Relative: 27 % (ref 12–46)
Lymphs Abs: 1.8 10*3/uL (ref 0.7–4.0)
Monocytes Absolute: 0.6 10*3/uL (ref 0.1–1.0)
Monocytes Relative: 9 % (ref 3–12)
Neutro Abs: 4.2 10*3/uL (ref 1.7–7.7)
Neutrophils Relative %: 61 % (ref 43–77)

## 2014-10-29 MED ORDER — SODIUM CHLORIDE 0.9 % IV SOLN
700.0000 mg | INTRAVENOUS | Status: DC
  Administered 2014-10-29: 700 mg via INTRAVENOUS
  Filled 2014-10-29: qty 70

## 2014-10-29 MED ORDER — ACETAMINOPHEN 325 MG PO TABS: 650.0000 mg | ORAL_TABLET | Freq: Once | ORAL | Status: DC

## 2014-10-29 MED ORDER — DIPHENHYDRAMINE HCL 25 MG PO TABS
50.0000 mg | ORAL_TABLET | Freq: Once | ORAL | Status: DC
  Filled 2014-10-29: qty 2

## 2014-10-29 MED ORDER — SODIUM CHLORIDE 0.9 % IV SOLN
INTRAVENOUS | Status: DC
  Administered 2014-10-29: 13:00:00 via INTRAVENOUS

## 2014-12-03 ENCOUNTER — Ambulatory Visit: Payer: 59

## 2014-12-10 ENCOUNTER — Ambulatory Visit (HOSPITAL_COMMUNITY)
Admission: RE | Admit: 2014-12-10 | Discharge: 2014-12-10 | Disposition: A | Discharge status: Home / Self Care | Payer: 59 | Source: Ambulatory Visit | Attending: Rheumatology | Admitting: Rheumatology

## 2014-12-10 DIAGNOSIS — L4050 Arthropathic psoriasis, unspecified: Secondary | ICD-10-CM | POA: Insufficient documentation

## 2014-12-10 LAB — COMPREHENSIVE METABOLIC PANEL
ALT: 83 U/L — ABNORMAL HIGH (ref 14–54)
AST: 39 U/L (ref 15–41)
Albumin: 3.7 g/dL (ref 3.5–5.0)
Alkaline Phosphatase: 72 U/L (ref 38–126)
Anion gap: 11 (ref 5–15)
BUN: 15 mg/dL (ref 6–20)
CO2: 23 mmol/L (ref 22–32)
Calcium: 9.2 mg/dL (ref 8.9–10.3)
Chloride: 105 mmol/L (ref 101–111)
Creatinine, Ser: 0.64 mg/dL (ref 0.44–1.00)
GFR calc Af Amer: >60 mL/min (ref >60)
GFR calc non Af Amer: >60 mL/min (ref >60)
Glucose, Bld: 168 mg/dL — ABNORMAL HIGH (ref 65–99)
Potassium: 4.2 mmol/L (ref 3.5–5.1)
Sodium: 139 mmol/L (ref 135–145)
Total Bilirubin: 0.5 mg/dL (ref 0.3–1.2)
Total Protein: 7 g/dL (ref 6.5–8.1)

## 2014-12-10 LAB — CBC WITH DIFFERENTIAL/PLATELET
Basophils Absolute: 0.1 10*3/uL (ref 0.0–0.1)
Basophils Relative: 1 % (ref 0–1)
Eosinophils Absolute: 0.3 10*3/uL (ref 0.0–0.7)
Eosinophils Relative: 4 % (ref 0–5)
HCT: 42.9 % (ref 36.0–46.0)
Hemoglobin: 14.2 g/dL (ref 12.0–15.0)
Lymphocytes Relative: 26 % (ref 12–46)
Lymphs Abs: 1.9 10*3/uL (ref 0.7–4.0)
MCH: 29.5 pg (ref 26.0–34.0)
MCHC: 33.1 g/dL (ref 30.0–36.0)
MCV: 89.2 fL (ref 78.0–100.0)
Monocytes Absolute: 0.6 10*3/uL (ref 0.1–1.0)
Monocytes Relative: 8 % (ref 3–12)
Neutro Abs: 4.4 10*3/uL (ref 1.7–7.7)
Neutrophils Relative %: 61 % (ref 43–77)
Platelets: 220 10*3/uL (ref 150–400)
RBC: 4.81 MIL/uL (ref 3.87–5.11)
RDW: 13 % (ref 11.5–15.5)
WBC: 7.2 10*3/uL (ref 4.0–10.5)

## 2014-12-10 MED ORDER — ACETAMINOPHEN 325 MG PO TABS: 650.0000 mg | ORAL_TABLET | ORAL | Status: DC

## 2014-12-10 MED ORDER — INFLIXIMAB 100 MG IV SOLR
700.0000 mg | INTRAVENOUS | Status: DC
  Administered 2014-12-10: 700 mg via INTRAVENOUS
  Filled 2014-12-10: qty 70

## 2014-12-10 MED ORDER — DIPHENHYDRAMINE HCL 25 MG PO CAPS: 50.0000 mg | ORAL_CAPSULE | ORAL | Status: DC

## 2014-12-10 MED ORDER — SODIUM CHLORIDE 0.9 % IV SOLN
INTRAVENOUS | Status: DC
  Administered 2014-12-10: 10:00:00 via INTRAVENOUS

## 2014-12-13 LAB — QUANTIFERON IN TUBE
QFT TB AG MINUS NIL VALUE: <0 IU/mL
QUANTIFERON MITOGEN VALUE: >10 IU/mL
QUANTIFERON TB AG VALUE: 0.05 IU/mL
QUANTIFERON TB GOLD: NEGATIVE
Quantiferon Nil Value: 0.06 IU/mL

## 2014-12-13 LAB — QUANTIFERON TB GOLD ASSAY (BLOOD)

## 2014-12-15 ENCOUNTER — Other Ambulatory Visit: Payer: Self-pay

## 2014-12-15 VITALS — BP 110/76 | HR 78 | Resp 16 | Ht 63.0 in | Wt 262.2 lb

## 2014-12-15 DIAGNOSIS — E119 Type 2 diabetes mellitus without complications: Secondary | ICD-10-CM

## 2014-12-15 NOTE — Patient Outreach (Signed)
[facility]) Care Management   12/15/2014  Tabitha [name] [birth year] [id number]  Tabitha [name] is an 49 y.o. female.   Member seen for follow up office visit for Link to Wellness program for self management of Type 2 diabetes  Subjective: Member states that she saw Dr. Buddy Duty on 11/06/14 and he changed her medications so she is now on Invokamet now.  States that her blood sugars have been improving since she started taking it.  States that now that she has separated from her husband her stress is much improved.  States she now has more control over her meals.  States she is trying to eat natural healthy foods that she cooks.  States she has lost 8 lbs since she started the new medication.  States that she has been doing yard work 3 times a week for activity and she walks the dogs once a week in the park.    Objective:   Review of Systems  Musculoskeletal: Positive for joint pain.  All other systems reviewed and are negative.   Physical Exam  Today's Vitals   12/15/14 1349 12/15/14 1355  BP: 110/76   Pulse: 78   Resp: 16   Height: 1.6 m (5\' 3" )   Weight: 262 lb 3.2 oz (118.933 kg)   SpO2: 97%   PainSc: 4  4   PainLoc: Hand     Current Medications:   Current Outpatient Prescriptions  Medication Sig Dispense Refill  . Canagliflozin-Metformin HCl 50-1000 MG TABS Take 1 tablet by mouth 2 (two) times daily.    . clobetasol cream (TEMOVATE) 8.58 % Apply 1 application topically 2 (two) times daily.    . folic acid (FOLVITE) 1 MG tablet Take 1 tablet (1 mg total) by mouth 2 (two) times daily.    Marland Kitchen inFLIXimab (REMICADE) 100 MG injection Inject into the vein.    . Liraglutide (VICTOZA) 18 MG/3ML SOPN Inject 0.3 mLs (1.8 mg total) into the skin daily. 6 mL 3  . Methotrexate Sodium, PF, 50 MG/2ML SOLN Inject 7.5 mg as directed once a week. On Friday    . Omega-3 Fatty Acids (FISH OIL) 1200 MG CAPS Take 1-2 capsules (1,200-2,400 mg total) by mouth 2 (two) times daily. Takes 2400 mg qam  and 1200 mg qpm    . TraMADol HCl 50 MG TBDP Take 1 tablet by mouth every 8 (eight) hours as needed. 90 tablet 3  . TURMERIC PO Take by mouth 2 (two) times daily.     . metFORMIN (GLUCOPHAGE) 1000 MG tablet Take 1 tablet (1,000 mg total) by mouth 2 (two) times daily with a meal. (Patient not taking: Reported on 12/15/2014) 60 tablet 3  . metFORMIN (GLUCOPHAGE) 1000 MG tablet Take 1 tablet (1,000 mg total) by mouth 2 (two) times daily with a meal. (Patient not taking: Reported on 12/15/2014) 180 tablet 3  . oxymetazoline (AFRIN) 0.05 % nasal spray Place 2 sprays into the nose daily as needed.     No current facility-administered medications for this visit.    Functional Status:   In your present state of health, do you have any difficulty performing the following activities: 12/15/2014 10/29/2014  Hearing? N N  Vision? N N  Difficulty concentrating or making decisions? N N  Walking or climbing stairs? N N  Dressing or bathing? N N  Doing errands, shopping? N -    Fall/Depression Screening:    PHQ 2/9 Scores 12/15/2014  PHQ - 2 Score 1  [facility] CM Care Plan Problem One        Patient Outreach from 12/15/2014 in [facility] Problem One  Potential for elevated blood sugars related to dx of Type 2 DM   Care Plan for Problem One  Active   [facility]N Long Term Goal (31-90 days)  Member will maintain hemoglobin A1C at or below 7 for the next 90 days   [facility]N Long Term Goal Start Date  12/15/14   Interventions for Problem One Long Term Goal  Reinforced on CHO counting and portion control, Instructed on use of new medication Invokamet and how it works, Reinforced  imoortance of regular exercise for glycemic control, Discussed how stress can make blood sugars go up and to continue to work on lowering her stress level      Assessment:   Member seen for follow up office visit for Link to [employer] for self management of Type 2 diabetes.  Member has seen improved CBGs since starting  Invokamet.  She reports better adherenceto lower CHO diet since living on her own.  She does not do regular exercise other than gardening and walking the dogs.  Member is to see Dr.Kerr on 02/18/15.  Plan:   Check blood sugar daily either fasting in AM or 1 1/2 -2 hrs after eating and record.  Goals are 80-130 fasting and less than 180 after eating. Try to eat 30-45 gms of carbohydrate per meal and 15 gm with snack.  Plan to eat more protein in diet and carry snacks.  Eat breakfast regularly. Plan to eat a snack  of protein and carbohydrate at bedtime  Plan to walk dogs 1-2 times a week for 20 minutes and continue to garden 3 times a week for 30 minutes Plan to keep 02/18/15 appointment with Dr. Buddy Duty Plan to return to [facility] to Wellness on  April 01, 2015 at [place], [facility] Care Management Coordinator-Link to [facility] Management [phone]

## 2014-12-16 NOTE — Patient Instructions (Signed)
1. Check blood sugar daily either fasting in AM or 1 1/2 -2 hrs after eating and record.  Goals are 80-130 fasting and less than 180 after eating. 2. Try to eat 30-45 gms of carbohydrate per meal and 15 gm with snack.  Plan to eat more protein in diet and carry snacks.  Eat breakfast regularly. 3. Plan to eat a snack  of protein and carbohydrate at bedtime  4. Plan to walk dogs 1-2 times a week for 20 minutes and continue to garden 3 times a week for 30 minutes 5. Plan to keep 02/18/15 appointment with Dr. Buddy Duty 6. Plan to return to Link to Wellness on  April 01, 2015 at [facility]

## 2014-12-29 ENCOUNTER — Encounter: Payer: Self-pay | Admitting: Family Medicine

## 2014-12-29 ENCOUNTER — Ambulatory Visit (INDEPENDENT_AMBULATORY_CARE_PROVIDER_SITE_OTHER): Payer: 59 | Admitting: Family Medicine

## 2014-12-29 VITALS — BP 126/82 | HR 78 | Wt 258.0 lb

## 2014-12-29 DIAGNOSIS — M7551 Bursitis of right shoulder: Secondary | ICD-10-CM | POA: Insufficient documentation

## 2014-12-29 DIAGNOSIS — M7552 Bursitis of left shoulder: Secondary | ICD-10-CM

## 2014-12-29 DIAGNOSIS — M9908 Segmental and somatic dysfunction of rib cage: Secondary | ICD-10-CM

## 2014-12-29 DIAGNOSIS — M9903 Segmental and somatic dysfunction of lumbar region: Secondary | ICD-10-CM | POA: Diagnosis not present

## 2014-12-29 DIAGNOSIS — M999 Biomechanical lesion, unspecified: Secondary | ICD-10-CM

## 2014-12-29 DIAGNOSIS — M545 Low back pain: Secondary | ICD-10-CM

## 2014-12-29 DIAGNOSIS — M9904 Segmental and somatic dysfunction of sacral region: Secondary | ICD-10-CM

## 2014-12-29 DIAGNOSIS — M5459 Other low back pain: Secondary | ICD-10-CM

## 2014-12-29 DIAGNOSIS — M9902 Segmental and somatic dysfunction of thoracic region: Secondary | ICD-10-CM

## 2014-12-29 MED ORDER — VITAMIN D (ERGOCALCIFEROL) 1.25 MG (50000 UNIT) PO CAPS: 50000.0000 [IU] | ORAL_CAPSULE | ORAL | Status: DC

## 2014-12-29 NOTE — Patient Instructions (Addendum)
Good to see you Start the exercises again regularly.  Exercises 3 times a week.  On wall with heels butt shoulder and head touching for  A goal of 5 minutes a day When sitting in  A chair put tennis ball between shoulder blades Ice can help after activity In your rub add red pepper and baby aspirion and try that Vitamin D  Weekly See me again in 3 weeks.

## 2014-12-29 NOTE — Progress Notes (Signed)
[name] [place], [place] [address] Phone: [phone] Subjective:     CC: Back pain follow-up  SLP:[id number] Tabitha [name] is a 49 y.o. female coming in with complaint of back pain. Patient is also noticing some shoulder pain. Seems to be bilateral and worse after working. Patient does have a history of psoriatic arthritis and has been told that this could be contribute to some of the discomfort. Patient does do a lot of manual labor at work and finds it difficult to continue. If she works 3 shifts in a row she has more increasing pain. Denies any significant weakness but feels that the muscle fatigue earlier. Patient has not had manipulation in quite some time but has had a history of slipped rib syndrome. An states that the lower back is also given her some more discomfort than usual. Denies any fevers chills or any abnormal weight loss. No significant changes in her past medical history from previous exam.    Past medical history, social, surgical and family history all reviewed in electronic medical record.   Review of Systems: No headache, visual changes, nausea, vomiting, diarrhea, constipation, dizziness, abdominal pain, skin rash, fevers, chills, night sweats, weight loss, swollen lymph nodes, body aches, joint swelling, muscle aches, chest pain, shortness of breath, mood changes.   Objective Blood pressure 126/82, pulse 78, weight 258 lb (117.028 kg), SpO2 97 %.  General: No apparent distress alert and oriented x3 mood and affect normal, dressed appropriately. obese HEENT: Pupils equal, extraocular movements intact  Respiratory: Patient's speak in full sentences and does not appear short of breath  Cardiovascular: No lower extremity edema, non tender, no erythema  Skin: Warm dry intact with no signs of infection or rash on extremities or on axial skeleton.  Abdomen: Soft nontender  Neuro: Cranial nerves II through XII are intact,  neurovascularly intact in all extremities with 2+ DTRs and 2+ pulses.  Lymph: No lymphadenopathy of posterior or anterior cervical chain or axillae bilaterally.  Gait normal with good balance and coordination.  MSK:  Non tender with full range of motion and good stability and symmetric strength and tone of , elbows, wrist, hip, knee and ankles bilaterally.  Shoulder: Bilateral Inspection reveals no abnormalities, atrophy or asymmetry. Palpation is normal with no tenderness over AC joint or bicipital groove. ROM is full in all planes passively. Rotator cuff strength normal throughout. signs of impingement with positive Neer and Hawkin's tests, but negative empty can sign. Speeds and Yergason's tests normal. No labral pathology noted with negative Obrien's, negative clunk and good stability. Normal scapular function observed. No painful arc and no drop arm sign. No apprehension sign   Back Exam:  Inspection: Unremarkable  Motion: Flexion 35 deg, Extension 45 deg, Side Bending to 35 deg bilaterally,  Rotation to 35 deg bilaterally  SLR laying: Negative  XSLR laying: Negative  Palpable tenderness: More shoulder tenderness overall. FABER: Positive right Sensory change: Gross sensation intact to all lumbar and sacral dermatomes.  Reflexes: 2+ at both patellar tendons, 2+ at achilles tendons, Babinski's downgoing.  Strength at foot  Plantar-flexion: 5/5 Dorsi-flexion: 5/5 Eversion: 5/5 Inversion: 5/5  Leg strength  Quad: 5/5 Hamstring: 5/5 Hip flexor: 5/5 Hip abductors: 4/5  Gait unremarkable.  OMT Physical Exam  Standing flexion right  Seated Flexion right  Cervical  C4 flexed rotated and side bent right  Thoracic T3 extended rotated and side bent left  Lumbar L2 flexed rotated inside that right  Sacrum Right right        Impression and Recommendations:     This case required medical decision making of moderate complexity.

## 2014-12-29 NOTE — Assessment & Plan Note (Signed)
Patient responded well to osteopathic manipulation today. We discussed home exercises and core strengthening exercises again. Patient was given a refresher course. Patient was given a handout we discussed hip abductor strengthening exercises as well. Patient come back and see me again in 3-4 weeks for further evaluation and treatment.

## 2014-12-29 NOTE — Assessment & Plan Note (Signed)
Patient likely has some shoulder bursitis bilaterally. Patient's history of psoriatic arthritis Sabino Gasser be contributing to some of the discomfort. We discussed the possibility of ultrasound as well as potential injection which patient declined at this moment. Patient will try topical anti-inflammatory, icing protocol, and home exercises. Vitamin D deficiency can also be a cause will start once weekly dosing for the next 12 weeks. Patient will try to make these different changes as well as we discussed looking for any other type of exacerbating factors a could be contribute in. Patient will come back in 3 weeks and if continuing to have difficulty we'll consider injection.

## 2014-12-29 NOTE — Assessment & Plan Note (Signed)
Decision today to treat with OMT was based on Physical Exam  After verbal consent patient was treated with HVLA, ME techniques in cervical, thoracic, lumbar and sacral areas  Patient tolerated the procedure well with improvement in symptoms  Patient given exercises, stretches and lifestyle modifications  See medications in patient instructions if given  Patient will follow up in 3 weeks    

## 2015-01-19 ENCOUNTER — Other Ambulatory Visit (HOSPITAL_COMMUNITY): Payer: Self-pay | Admitting: *Deleted

## 2015-01-20 ENCOUNTER — Ambulatory Visit (INDEPENDENT_AMBULATORY_CARE_PROVIDER_SITE_OTHER): Payer: 59 | Admitting: Family Medicine

## 2015-01-20 ENCOUNTER — Ambulatory Visit (HOSPITAL_COMMUNITY)
Admission: RE | Admit: 2015-01-20 | Discharge: 2015-01-20 | Disposition: A | Discharge status: Home / Self Care | Payer: 59 | Source: Ambulatory Visit | Attending: Rheumatology | Admitting: Rheumatology

## 2015-01-20 ENCOUNTER — Encounter: Payer: Self-pay | Admitting: Family Medicine

## 2015-01-20 VITALS — BP 114/78 | HR 83 | Ht 63.0 in | Wt 259.0 lb

## 2015-01-20 DIAGNOSIS — M7551 Bursitis of right shoulder: Secondary | ICD-10-CM

## 2015-01-20 DIAGNOSIS — M9904 Segmental and somatic dysfunction of sacral region: Secondary | ICD-10-CM | POA: Diagnosis not present

## 2015-01-20 DIAGNOSIS — M9908 Segmental and somatic dysfunction of rib cage: Secondary | ICD-10-CM | POA: Diagnosis not present

## 2015-01-20 DIAGNOSIS — M7552 Bursitis of left shoulder: Secondary | ICD-10-CM

## 2015-01-20 DIAGNOSIS — L4050 Arthropathic psoriasis, unspecified: Secondary | ICD-10-CM | POA: Diagnosis not present

## 2015-01-20 DIAGNOSIS — M533 Sacrococcygeal disorders, not elsewhere classified: Secondary | ICD-10-CM | POA: Diagnosis not present

## 2015-01-20 DIAGNOSIS — M9903 Segmental and somatic dysfunction of lumbar region: Secondary | ICD-10-CM

## 2015-01-20 DIAGNOSIS — M999 Biomechanical lesion, unspecified: Secondary | ICD-10-CM

## 2015-01-20 MED ORDER — SODIUM CHLORIDE 0.9 % IV SOLN
INTRAVENOUS | Status: DC
  Administered 2015-01-20: 09:00:00 via INTRAVENOUS

## 2015-01-20 MED ORDER — SODIUM CHLORIDE 0.9 % IV SOLN
6.0000 mg/kg | INTRAVENOUS | Status: DC
  Administered 2015-01-20: 700 mg via INTRAVENOUS
  Filled 2015-01-20: qty 70

## 2015-01-20 NOTE — Assessment & Plan Note (Signed)
Decision today to treat with OMT was based on Physical Exam  After verbal consent patient was treated with HVLA, ME techniques in cervical, thoracic, lumbar and sacral areas  Patient tolerated the procedure well with improvement in symptoms  Patient given exercises, stretches and lifestyle modifications  See medications in patient instructions if given  Patient will follow up in 3-4 weeks  

## 2015-01-20 NOTE — Progress Notes (Signed)
Pre visit review using our clinic review tool, if applicable. No additional management support is needed unless otherwise documented below in the visit note. 

## 2015-01-20 NOTE — Progress Notes (Signed)
  [name] [address], [place] [address] Phone: [phone] Subjective:     CC: Back pain follow-up  [id number] Tabitha [name] is a 49 y.o. female coming in with complaint of back pain. Patient states though that she is doing significant a better. The bilateral shoulder pain is significantly better. No radiation down the arms or any numbness. States lower back is much better as well. Able to do activities on a more regular basis.    Past medical history, social, surgical and family history all reviewed in electronic medical record.   Review of Systems: No headache, visual changes, nausea, vomiting, diarrhea, constipation, dizziness, abdominal pain, skin rash, fevers, chills, night sweats, weight loss, swollen lymph nodes, body aches, joint swelling, muscle aches, chest pain, shortness of breath, mood changes.   Objective Blood pressure 114/78, pulse 83, height 5\' 3"  (1.6 m), weight 259 lb (117.482 kg), SpO2 95 %.  General: No apparent distress alert and oriented x3 mood and affect normal, dressed appropriately. obese HEENT: Pupils equal, extraocular movements intact  Respiratory: Patient's speak in full sentences and does not appear short of breath  Cardiovascular: No lower extremity edema, non tender, no erythema  Skin: Warm dry intact with no signs of infection or rash on extremities or on axial skeleton.  Abdomen: Soft nontender  Neuro: Cranial nerves II through XII are intact, neurovascularly intact in all extremities with 2+ DTRs and 2+ pulses.  Lymph: No lymphadenopathy of posterior or anterior cervical chain or axillae bilaterally.  Gait normal with good balance and coordination.  MSK:  Non tender with full range of motion and good stability and symmetric strength and tone of , elbows, wrist, hip, knee and ankles bilaterally.  Shoulder: Bilateral Inspection reveals no abnormalities, atrophy or asymmetry. Palpation is normal with no  tenderness over AC joint or bicipital groove. ROM is full in all planes passively. Rotator cuff strength normal throughout. No impingement noted Speeds and Yergason's tests normal. No labral pathology noted with negative Obrien's, negative clunk and good stability. Normal scapular function observed. No painful arc and no drop arm sign. No apprehension sign   Back Exam:  Inspection: Unremarkable  Motion: Flexion 35 deg, Extension 45 deg, Side Bending to 35 deg bilaterally,  Rotation to 35 deg bilaterally  SLR laying: Negative  XSLR laying: Negative  Palpable tenderness: Significant decrease in tenderness from previous exam FABER: Positive right Sensory change: Gross sensation intact to all lumbar and sacral dermatomes.  Reflexes: 2+ at both patellar tendons, 2+ at achilles tendons, Babinski's downgoing.  Strength at foot  Plantar-flexion: 5/5 Dorsi-flexion: 5/5 Eversion: 5/5 Inversion: 5/5  Leg strength  Quad: 5/5 Hamstring: 5/5 Hip flexor: 5/5 Hip abductors: 4/5  Gait unremarkable.  OMT Physical Exam  Cervical  C4 flexed rotated and side bent right  Thoracic T3 extended rotated and side bent left  Lumbar L2 flexed rotated inside that right  Sacrum Right right        Impression and Recommendations:     This case required medical decision making of moderate complexity.

## 2015-01-20 NOTE — Assessment & Plan Note (Signed)
Continues to do well with conservative therapy.

## 2015-01-20 NOTE — Patient Instructions (Signed)
Good to see you You are amazing! Good luck with the school.  I will keep you updated.  See me again in 6 weeks.

## 2015-01-20 NOTE — Assessment & Plan Note (Signed)
Significantly improved at this time. Encourage her to continue home exercises and the over-the-counter natural supplementation. Patient continues to be treated for the dysfunction. Patient will continue to be active and attempt to lose weight. Patient will come back and see me again in 3-4 weeks.

## 2015-02-24 ENCOUNTER — Ambulatory Visit (INDEPENDENT_AMBULATORY_CARE_PROVIDER_SITE_OTHER): Payer: 59 | Admitting: Internal Medicine

## 2015-02-24 ENCOUNTER — Encounter: Payer: Self-pay | Admitting: Internal Medicine

## 2015-02-24 VITALS — BP 124/82 | HR 86 | Temp 98.4°F | Ht 62.0 in | Wt 261.0 lb

## 2015-02-24 DIAGNOSIS — J069 Acute upper respiratory infection, unspecified: Secondary | ICD-10-CM | POA: Insufficient documentation

## 2015-02-24 DIAGNOSIS — R062 Wheezing: Secondary | ICD-10-CM

## 2015-02-24 DIAGNOSIS — E0865 Diabetes mellitus due to underlying condition with hyperglycemia: Secondary | ICD-10-CM

## 2015-02-24 MED ORDER — METHYLPREDNISOLONE ACETATE 80 MG/ML IJ SUSP
80.0000 mg | Freq: Once | INTRAMUSCULAR | Status: AC
  Administered 2015-02-24: 80 mg via INTRAMUSCULAR

## 2015-02-24 MED ORDER — LEVOFLOXACIN 500 MG PO TABS: 500.0000 mg | ORAL_TABLET | Freq: Every day | ORAL | Status: DC

## 2015-02-24 MED ORDER — ALBUTEROL SULFATE HFA 108 (90 BASE) MCG/ACT IN AERS: 2.0000 | INHALATION_SPRAY | Freq: Four times a day (QID) | RESPIRATORY_TRACT | Status: DC | PRN

## 2015-02-24 MED ORDER — PREDNISONE 10 MG PO TABS: ORAL_TABLET | ORAL | Status: DC

## 2015-02-24 MED ORDER — HYDROCODONE-HOMATROPINE 5-1.5 MG/5ML PO SYRP: 5.0000 mL | ORAL_SOLUTION | Freq: Four times a day (QID) | ORAL | Status: DC | PRN

## 2015-02-24 NOTE — Patient Instructions (Signed)
You had the steroid shot today  Please take all new medication as prescribed- the antibiotic, cough medicine, prednisone and the inhaler as needed  Please continue all other medications as before, and refills have been done if requested.  Please have the pharmacy call with any other refills you may need.  Please keep your appointments with your specialists as you may have planned

## 2015-02-24 NOTE — Progress Notes (Signed)
Subjective:    Patient ID: Tabitha [name], female    DOB: [birth year], 49 y.o.   MRN: [id number]  HPI   Here with 2-3 days acute onset fever, facial pain, pressure, headache, general weakness and malaise, and greenish d/c, with mild ST and cough, but pt denies chest pain, wheezing, increased sob or doe, orthopnea, PND, increased LE swelling, palpitations, dizziness or syncope, except for onset mild wheeze/sob last PM, Pt denies new neurological symptoms such as new headache, or facial or extremity weakness or numbness   Pt denies polydipsia, polyuria,   Pt states overall good compliance with meds Past Medical History  Diagnosis Date  . Dermatophytosis, scalp   . De Quervain's disease (tenosynovitis)   . Hypercholesteremia   . Polycystic ovarian disease   . Obesity   . Diabetes mellitus   . Wears glasses   . Arthritis   . Psoriatic arthritis    Past Surgical History  Procedure Laterality Date  . Wrist surgery  2001    carpel tunnel -rt  . De quervain's release  2012    left  . Wisdom tooth extraction    . Colonoscopy    . Knee arthroscopy Right 05/06/2013    Procedure: RIGHT KNEE ARTHROSCOPY ;  Surgeon: Hessie Dibble, MD;  Location: [facility];  Service: Orthopedics;  Laterality: Right;  partial lateral minisectomy and chondroplasty    reports that she quit smoking about 19 years ago. Her smoking use included Cigarettes. She started smoking about 30 years ago. She smoked 1.30 packs per day. She has never used smokeless tobacco. She reports that she drinks about 3.5 oz of alcohol per week. She reports that she does not use illicit drugs. family history includes Alzheimer's disease in her maternal grandmother; Cancer (age of onset: 36) in her mother; Diabetes in her father and paternal grandmother; Diabetes type II in an other family member; Heart disease in her maternal grandmother and paternal grandfather; Hypertension in her brother, father, and another family member;  Kidney disease in her father; Obesity in her brother and another family member; Stroke in her father. Allergies  Allergen Reactions  . Chocolate Anaphylaxis  . Food     chocolate  . Statins     Intolerant  lft elevation  . Latex Rash   Current Outpatient Prescriptions on File Prior to Visit  Medication Sig Dispense Refill  . Canagliflozin-Metformin HCl 50-1000 MG TABS Take 1 tablet by mouth 2 (two) times daily.    . clobetasol cream (TEMOVATE) 2.95 % Apply 1 application topically 2 (two) times daily.    . folic acid (FOLVITE) 1 MG tablet Take 1 tablet (1 mg total) by mouth 2 (two) times daily.    Marland Kitchen inFLIXimab (REMICADE) 100 MG injection Inject into the vein.    . Liraglutide (VICTOZA) 18 MG/3ML SOPN Inject 0.3 mLs (1.8 mg total) into the skin daily. 6 mL 3  . Methotrexate Sodium, PF, 50 MG/2ML SOLN Inject 7.5 mg as directed once a week. On Friday    . Omega-3 Fatty Acids (FISH OIL) 1200 MG CAPS Take 1-2 capsules (1,200-2,400 mg total) by mouth 2 (two) times daily. Takes 2400 mg qam and 1200 mg qpm    . TraMADol HCl 50 MG TBDP Take 1 tablet by mouth every 8 (eight) hours as needed. 90 tablet 3  . TURMERIC PO Take by mouth 2 (two) times daily.     . Vitamin D, Ergocalciferol, (DRISDOL) 50000 UNITS CAPS capsule Take 1 capsule (  50,000 Units total) by mouth every 7 (seven) days. 12 capsule 0   No current facility-administered medications on file prior to visit.   Review of Systems  Constitutional: Negative for unusual diaphoresis or night sweats HENT: Negative for ringing in ear or discharge Eyes: Negative for double vision or worsening visual disturbance.  Respiratory: Negative for choking and stridor.   Gastrointestinal: Negative for vomiting or other signifcant bowel change Genitourinary: Negative for hematuria or change in urine volume.  Musculoskeletal: Negative for other MSK pain or swelling Skin: Negative for color change and worsening wound.  Neurological: Negative for tremors  and numbness other than noted  Psychiatric/Behavioral: Negative for decreased concentration or agitation other than above       Objective:   Physical Exam Blood pressure 124/82, pulse 86, temperature 98.4 F (36.9 C), temperature source Oral, height 5\' 2"  (1.575 m), weight 261 lb (118.389 kg), SpO2 96 %. VS noted, mild ill Constitutional: Pt appears in no significant distress HENT: Head: NCAT.  Right Ear: External ear normal.  Left Ear: External ear normal.  Eyes: . Pupils are equal, round, and reactive to light. Conjunctivae and EOM are normal Bilat tm's with mild erythema.  Max sinus areas mild tender.  Pharynx with mild erythema, no exudate Neck: Normal range of motion. Neck supple.  Cardiovascular: Normal rate and regular rhythm.   Pulmonary/Chest: Effort normal and breath sounds without rales but few mild bilat wheezing.  Neurological: Pt is alert. Not confused , motor grossly intact Skin: Skin is warm. No rash, no LE edema Psychiatric: Pt behavior is normal. No agitation.     Assessment & Plan:

## 2015-02-24 NOTE — Progress Notes (Signed)
Pre visit review using our clinic review tool, if applicable. No additional management support is needed unless otherwise documented below in the visit note. 

## 2015-02-28 NOTE — Assessment & Plan Note (Signed)
Mild to mod, for antibx course,  to f/u any worsening symptoms or concerns 

## 2015-02-28 NOTE — Assessment & Plan Note (Signed)
stable overall by history and exam, recent data reviewed with pt, and pt to continue medical treatment as before,  to f/u any worsening symptoms or concerns Lab Results  Component Value Date   HGBA1C 6.1 12/06/2011   Pt to call for onset polys or cbg > 200 with steroid tx

## 2015-02-28 NOTE — Assessment & Plan Note (Signed)
Mild to mod, for depomedrol IM, predpac asd, cough med prn,  to f/u any worsening symptoms or concerns 

## 2015-03-02 ENCOUNTER — Ambulatory Visit: Payer: 59 | Admitting: Family Medicine

## 2015-03-03 ENCOUNTER — Encounter (HOSPITAL_COMMUNITY): Payer: 59

## 2015-03-10 ENCOUNTER — Ambulatory Visit (HOSPITAL_COMMUNITY)
Admission: RE | Admit: 2015-03-10 | Discharge: 2015-03-10 | Disposition: A | Discharge status: Home / Self Care | Payer: 59 | Source: Ambulatory Visit | Attending: Rheumatology | Admitting: Rheumatology

## 2015-03-10 DIAGNOSIS — L4050 Arthropathic psoriasis, unspecified: Secondary | ICD-10-CM | POA: Insufficient documentation

## 2015-03-10 LAB — COMPREHENSIVE METABOLIC PANEL
ALT: 64 U/L — ABNORMAL HIGH (ref 14–54)
AST: 38 U/L (ref 15–41)
Albumin: 3.4 g/dL — ABNORMAL LOW (ref 3.5–5.0)
Alkaline Phosphatase: 69 U/L (ref 38–126)
Anion gap: 9 (ref 5–15)
BUN: 12 mg/dL (ref 6–20)
CO2: 25 mmol/L (ref 22–32)
Calcium: 9.1 mg/dL (ref 8.9–10.3)
Chloride: 106 mmol/L (ref 101–111)
Creatinine, Ser: 0.78 mg/dL (ref 0.44–1.00)
GFR calc Af Amer: >60 mL/min (ref >60)
GFR calc non Af Amer: >60 mL/min (ref >60)
Glucose, Bld: 170 mg/dL — ABNORMAL HIGH (ref 65–99)
Potassium: 4.3 mmol/L (ref 3.5–5.1)
Sodium: 140 mmol/L (ref 135–145)
Total Bilirubin: 0.6 mg/dL (ref 0.3–1.2)
Total Protein: 6.5 g/dL (ref 6.5–8.1)

## 2015-03-10 LAB — CBC
HCT: 43.6 % (ref 36.0–46.0)
Hemoglobin: 14.1 g/dL (ref 12.0–15.0)
MCH: 29.1 pg (ref 26.0–34.0)
MCHC: 32.3 g/dL (ref 30.0–36.0)
MCV: 90.1 fL (ref 78.0–100.0)
Platelets: 216 10*3/uL (ref 150–400)
RBC: 4.84 MIL/uL (ref 3.87–5.11)
RDW: 13.6 % (ref 11.5–15.5)
WBC: 9.9 10*3/uL (ref 4.0–10.5)

## 2015-03-10 LAB — DIFFERENTIAL
Basophils Absolute: 0 10*3/uL (ref 0.0–0.1)
Basophils Relative: 0 %
Eosinophils Absolute: 0.3 10*3/uL (ref 0.0–0.7)
Eosinophils Relative: 3 %
Lymphocytes Relative: 26 %
Lymphs Abs: 2.6 10*3/uL (ref 0.7–4.0)
Monocytes Absolute: 0.8 10*3/uL (ref 0.1–1.0)
Monocytes Relative: 8 %
Neutro Abs: 6.1 10*3/uL (ref 1.7–7.7)
Neutrophils Relative %: 63 %

## 2015-03-10 MED ORDER — SODIUM CHLORIDE 0.9 % IV SOLN
6.0000 mg/kg | INTRAVENOUS | Status: DC
  Administered 2015-03-10: 700 mg via INTRAVENOUS
  Filled 2015-03-10: qty 70

## 2015-03-10 MED ORDER — SODIUM CHLORIDE 0.9 % IV SOLN
INTRAVENOUS | Status: DC
  Administered 2015-03-10: 250 mL via INTRAVENOUS

## 2015-03-18 ENCOUNTER — Other Ambulatory Visit: Payer: Self-pay

## 2015-03-18 MED ORDER — LIRAGLUTIDE 18 MG/3ML ~~LOC~~ SOPN: 1.8000 mg | PEN_INJECTOR | Freq: Every day | SUBCUTANEOUS | Status: DC

## 2015-04-01 ENCOUNTER — Ambulatory Visit: Payer: 59

## 2015-04-13 ENCOUNTER — Other Ambulatory Visit: Payer: Self-pay

## 2015-04-13 VITALS — BP 120/78 | HR 75 | Resp 14 | Ht 63.0 in | Wt 258.4 lb

## 2015-04-13 DIAGNOSIS — E119 Type 2 diabetes mellitus without complications: Secondary | ICD-10-CM

## 2015-04-13 NOTE — Patient Instructions (Signed)
1. Plan to check blood sugar daily either fasting in AM or 1 1/2 -2 hrs after eating and record.  Goals are 80-130 fasting and less than 180 after eating. 2. Try to eat 30-45 gms of carbohydrate per meal and 15 gm with snack.  Plan to eat more protein in diet and carry snacks.  Eat breakfast regularly. 3. Plan to walk dogs 2 times a week for 30 minutes and continue to garden 3 times a week for 30 minutes.  Consider joining gym with pool 4. Plan to return to [facility] on  07/20/15 at 11AM

## 2015-04-13 NOTE — Patient Outreach (Signed)
[facility] Care Management   04/13/2015  Tabitha [name] May 23, 1966 [id number]  Tabitha [name] is an 49 y.o. female.   Member seen for follow up office visit for [facility] program for self management of Type 2 diabetes  Subjective: Member states that she saw Dr.Kerr on 02/18/15 and her hemoglobin A1C has dropped to 5.9.  States that her blood sugars have been much better since she started the Invokamet.  States that even when she was on a prednisone taper her blood sugars did not go up very much.  States she has been following a very low CHO diet.  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  States she has been more active at home cleaning and doing yard work.  States she has been walking her dogs at least twice a week.  Objective:   Review of Systems  Musculoskeletal: Positive for joint pain.  All other systems reviewed and are negative. Reviewed glucometer 14 day average-121 30 day average128  Physical Exam  Today's Vitals   04/13/15 1114 04/13/15 1118  BP: 120/78   Pulse: 75   Resp: 14   Height: 1.6 m (5\' 3" )   Weight: 258 lb 6.4 oz (117.209 kg)   SpO2: 96%   PainSc: 2  2     Current Medications:   Current Outpatient Prescriptions  Medication Sig Dispense Refill  . Canagliflozin-Metformin HCl 50-1000 MG TABS Take 1 tablet by mouth 2 (two) times daily.    . clobetasol cream (TEMOVATE) 0.10 % Apply 1 application topically 2 (two) times daily.    . folic acid (FOLVITE) 1 MG tablet Take 1 tablet (1 mg total) by mouth 2 (two) times daily.    Marland Kitchen inFLIXimab (REMICADE) 100 MG injection Inject into the vein.    . Liraglutide (VICTOZA) 18 MG/3ML SOPN Inject 0.3 mLs (1.8 mg total) into the skin daily. 6 mL 3  . Methotrexate Sodium, PF, 50 MG/2ML SOLN Inject 7.5 mg as directed once a week. On Friday    . Omega-3 Fatty Acids (FISH OIL) 1200 MG CAPS Take 1-2 capsules (1,200-2,400 mg total) by mouth 2 (two) times  daily. Takes 2400 mg qam and 1200 mg qpm    . TraMADol HCl 50 MG TBDP Take 1 tablet by mouth every 8 (eight) hours as needed. 90 tablet 3  . TURMERIC PO Take by mouth 2 (two) times daily.     . Vitamin D, Ergocalciferol, (DRISDOL) 50000 UNITS CAPS capsule Take 1 capsule (50,000 Units total) by mouth every 7 (seven) days. 12 capsule 0  . albuterol (PROVENTIL HFA;VENTOLIN HFA) 108 (90 BASE) MCG/ACT inhaler Inhale 2 puffs into the lungs every 6 (six) hours as needed for wheezing or shortness of breath. 1 Inhaler 2  . HYDROcodone-homatropine (HYCODAN) 5-1.5 MG/5ML syrup Take 5 mLs by mouth every 6 (six) hours as needed for cough. (Patient not taking: Reported on 04/13/2015) 180 mL 0  . levofloxacin (LEVAQUIN) 500 MG tablet Take 1 tablet (500 mg total) by mouth daily. (Patient not taking: Reported on 04/13/2015) 10 tablet 0  . predniSONE (DELTASONE) 10 MG tablet 3 tabs by mouth per day for 3 days,2tabs per day for 3 days,1tab per day for 3 days (Patient not taking: Reported on 04/13/2015) 18 tablet 0   No current facility-administered medications for this visit.    Functional Status:   In your present state of health, do you have any  difficulty performing the following activities: 04/13/2015 03/10/2015  Hearing? N N  Vision? N N  Difficulty concentrating or making decisions? N N  Walking or climbing stairs? N N  Dressing or bathing? N N  Doing errands, shopping? N -    Fall/Depression Screening:    PHQ 2/9 Scores 04/13/2015 12/15/2014  PHQ - 2 Score 0 1    Assessment:   Member seen for follow up office visit for [facility] program for self management of Type 2 diabetes.  Member is meeting diabetes goal of hemoglobin A1C with last reading 5.9 which is decreased from 6.9.  Member reports following low CHO diet and has increased activity.  Reports tolerating new medication without adverse effects.  Plan:  Plan to check blood sugar daily either fasting in AM or 1 1/2 -2 hrs after eating and  record.  Goals are 80-130 fasting and less than 180 after eating. Try to eat 30-45 gms of carbohydrate per meal and 15 gm with snack.  Plan to eat more protein in diet and carry snacks.  Eat breakfast regularly. Plan to walk dogs 2 times a week for 30 minutes and continue to garden 3 times a week for 30 minutes.  Consider joining gym with pool Plan to return to [facility] on  07/20/15 at [place] Problem One        Most Recent Value   Care Plan Problem One  Potential for elevated blood sugars related to dx of Type 2 DM   Role Documenting the Problem One  Care Management [place] for Problem One  Active   [facility] Long Term Goal (31-90 days)  Member will maintain hemoglobin A1C at or below 7 for the next 90 days   [facility] Long Term Goal Start Date  04/13/15 [Maintaining hemoglobin A1C with last reading 5.9]   Interventions for Problem One Long Term Goal  Reinforced on CHO counting and portion control, Reinforced  importance of regular exercise for glycemic control,  Encouraged to go back to water aerobics, Praised for lower her hemoglobin A1C, Discussed benefits of weight lose     Peter Garter RN, [facility] Care Management Coordinator-[facility] to [place] [facility] [phone]

## 2015-04-20 ENCOUNTER — Other Ambulatory Visit (HOSPITAL_COMMUNITY): Payer: Self-pay | Admitting: *Deleted

## 2015-04-21 ENCOUNTER — Encounter (HOSPITAL_COMMUNITY)
Admission: RE | Admit: 2015-04-21 | Discharge: 2015-04-21 | Disposition: A | Discharge status: Home / Self Care | Payer: 59 | Source: Ambulatory Visit | Attending: Rheumatology | Admitting: Rheumatology

## 2015-04-21 DIAGNOSIS — L4050 Arthropathic psoriasis, unspecified: Secondary | ICD-10-CM | POA: Insufficient documentation

## 2015-04-21 MED ORDER — ACETAMINOPHEN 325 MG PO TABS: 650.0000 mg | ORAL_TABLET | ORAL | Status: DC

## 2015-04-21 MED ORDER — SODIUM CHLORIDE 0.9 % IV SOLN
6.0000 mg/kg | INTRAVENOUS | Status: DC
  Administered 2015-04-21: 700 mg via INTRAVENOUS
  Filled 2015-04-21: qty 70

## 2015-04-21 MED ORDER — SODIUM CHLORIDE 0.9 % IV SOLN
INTRAVENOUS | Status: DC
  Administered 2015-04-21: 09:00:00 via INTRAVENOUS

## 2015-04-21 MED ORDER — DIPHENHYDRAMINE HCL 25 MG PO CAPS: 50.0000 mg | ORAL_CAPSULE | ORAL | Status: DC

## 2015-05-31 ENCOUNTER — Encounter (HOSPITAL_COMMUNITY)
Admission: RE | Admit: 2015-05-31 | Discharge: 2015-05-31 | Disposition: A | Discharge status: Home / Self Care | Payer: 59 | Source: Ambulatory Visit | Attending: Rheumatology | Admitting: Rheumatology

## 2015-05-31 DIAGNOSIS — L4050 Arthropathic psoriasis, unspecified: Secondary | ICD-10-CM | POA: Insufficient documentation

## 2015-05-31 LAB — CBC
HCT: 45.4 % (ref 36.0–46.0)
Hemoglobin: 15 g/dL (ref 12.0–15.0)
MCH: 29.9 pg (ref 26.0–34.0)
MCHC: 33 g/dL (ref 30.0–36.0)
MCV: 90.6 fL (ref 78.0–100.0)
Platelets: 269 10*3/uL (ref 150–400)
RBC: 5.01 MIL/uL (ref 3.87–5.11)
RDW: 13.6 % (ref 11.5–15.5)
WBC: 9 10*3/uL (ref 4.0–10.5)

## 2015-05-31 LAB — COMPREHENSIVE METABOLIC PANEL
ALT: 56 U/L — ABNORMAL HIGH (ref 14–54)
AST: 40 U/L (ref 15–41)
Albumin: 3.4 g/dL — ABNORMAL LOW (ref 3.5–5.0)
Alkaline Phosphatase: 69 U/L (ref 38–126)
Anion gap: 7 (ref 5–15)
BUN: 14 mg/dL (ref 6–20)
CO2: 24 mmol/L (ref 22–32)
Calcium: 9 mg/dL (ref 8.9–10.3)
Chloride: 107 mmol/L (ref 101–111)
Creatinine, Ser: 0.9 mg/dL (ref 0.44–1.00)
GFR calc Af Amer: >60 mL/min (ref >60)
GFR calc non Af Amer: >60 mL/min (ref >60)
Glucose, Bld: 84 mg/dL (ref 65–99)
Potassium: 4.6 mmol/L (ref 3.5–5.1)
Sodium: 138 mmol/L (ref 135–145)
Total Bilirubin: 0.6 mg/dL (ref 0.3–1.2)
Total Protein: 6.1 g/dL — ABNORMAL LOW (ref 6.5–8.1)

## 2015-05-31 LAB — DIFFERENTIAL
Basophils Absolute: 0.1 10*3/uL (ref 0.0–0.1)
Basophils Relative: 1 %
Eosinophils Absolute: 0.3 10*3/uL (ref 0.0–0.7)
Eosinophils Relative: 3 %
Lymphocytes Relative: 32 %
Lymphs Abs: 2.9 10*3/uL (ref 0.7–4.0)
Monocytes Absolute: 0.9 10*3/uL (ref 0.1–1.0)
Monocytes Relative: 10 %
Neutro Abs: 4.9 10*3/uL (ref 1.7–7.7)
Neutrophils Relative %: 54 %

## 2015-05-31 MED ORDER — SODIUM CHLORIDE 0.9 % IV SOLN
Freq: Once | INTRAVENOUS | Status: AC
  Administered 2015-05-31: 13:00:00 via INTRAVENOUS

## 2015-05-31 MED ORDER — DIPHENHYDRAMINE HCL 25 MG PO CAPS: 50.0000 mg | ORAL_CAPSULE | Freq: Once | ORAL | Status: DC

## 2015-05-31 MED ORDER — SODIUM CHLORIDE 0.9 % IV SOLN
6.0000 mg/kg | Freq: Once | INTRAVENOUS | Status: AC
  Administered 2015-05-31: 700 mg via INTRAVENOUS
  Filled 2015-05-31: qty 70

## 2015-05-31 MED ORDER — ACETAMINOPHEN 325 MG PO TABS: 650.0000 mg | ORAL_TABLET | Freq: Once | ORAL | Status: DC

## 2015-06-02 ENCOUNTER — Encounter (HOSPITAL_COMMUNITY): Payer: 59

## 2015-06-10 ENCOUNTER — Ambulatory Visit (INDEPENDENT_AMBULATORY_CARE_PROVIDER_SITE_OTHER): Payer: 59 | Admitting: Internal Medicine

## 2015-06-10 ENCOUNTER — Encounter: Payer: Self-pay | Admitting: Internal Medicine

## 2015-06-10 VITALS — BP 124/78 | HR 93 | Temp 98.8°F | Resp 14 | Ht 63.0 in | Wt 263.0 lb

## 2015-06-10 DIAGNOSIS — J069 Acute upper respiratory infection, unspecified: Secondary | ICD-10-CM

## 2015-06-10 NOTE — Progress Notes (Signed)
Pre visit review using our clinic review tool, if applicable. No additional management support is needed unless otherwise documented below in the visit note. 

## 2015-06-10 NOTE — Patient Instructions (Signed)
Call us in 3-4 weeks if you are still coughing.

## 2015-06-11 NOTE — Progress Notes (Signed)
   Subjective:    Patient ID: Tabitha [name], female    DOB: June 06, 1966, 49 y.o.   MRN: [id number]  HPI The patient is a 49 YO female coming in for cough. She had a cold about 1 month ago and initially had worse symptoms with sinus congestion and throat pain. This is gone but she still has the cough. It is not keeping her up at night. No sore throat and denies sinus congestion. No fevers or chills. No SOB with activity.   Review of Systems  Constitutional: Negative for fever, activity change, appetite change, fatigue and unexpected weight change.  HENT: Negative.   Eyes: Negative.   Respiratory: Positive for cough. Negative for chest tightness, shortness of breath and wheezing.   Cardiovascular: Negative for chest pain, palpitations and leg swelling.  Gastrointestinal: Negative for abdominal pain, diarrhea, constipation and abdominal distention.  Musculoskeletal: Positive for myalgias and arthralgias.  Skin: Negative.   Neurological: Negative.   Psychiatric/Behavioral: Negative for suicidal ideas, confusion, sleep disturbance, self-injury and decreased concentration. The patient is not nervous/anxious.       Objective:   Physical Exam  Constitutional: She is oriented to person, place, and time. She appears well-developed and well-nourished.  Overweight  HENT:  Head: Normocephalic and atraumatic.  Minimal erythema oropharynx and no nasal crusting.  Eyes: EOM are normal.  Neck: Normal range of motion.  Cardiovascular: Normal rate and regular rhythm.   Pulmonary/Chest: Effort normal and breath sounds normal. No respiratory distress. She has no wheezes. She has no rales.  Clear  Abdominal: Soft. Bowel sounds are normal. She exhibits no distension. There is no tenderness.  Neurological: She is alert and oriented to person, place, and time. Coordination normal.  Skin: Skin is warm and dry.   Filed Vitals:   06/10/15 1449  BP: 124/78  Pulse: 93  Temp: 98.8 F (37.1 C)  TempSrc: Oral    Resp: 14  Height: 5\' 3"  (1.6 m)  Weight: 263 lb (119.296 kg)  SpO2: 98%      Assessment & Plan:

## 2015-06-11 NOTE — Assessment & Plan Note (Signed)
She does have a post-viral cough at this time. Offered cough syrup if needed but she is not bothered by the cough. If still coughing in 3 weeks will call and we will get chest x-ray. She is improving gradually overall with her other symptoms and the cough is lingering.

## 2015-06-15 DIAGNOSIS — F4321 Adjustment disorder with depressed mood: Secondary | ICD-10-CM | POA: Diagnosis not present

## 2015-06-22 ENCOUNTER — Ambulatory Visit (INDEPENDENT_AMBULATORY_CARE_PROVIDER_SITE_OTHER): Payer: 59 | Admitting: Family Medicine

## 2015-06-22 ENCOUNTER — Other Ambulatory Visit: Payer: 59

## 2015-06-22 ENCOUNTER — Encounter: Payer: Self-pay | Admitting: Family Medicine

## 2015-06-22 ENCOUNTER — Other Ambulatory Visit (INDEPENDENT_AMBULATORY_CARE_PROVIDER_SITE_OTHER): Payer: 59

## 2015-06-22 VITALS — BP 134/82 | HR 99 | Ht 63.0 in | Wt 260.0 lb

## 2015-06-22 DIAGNOSIS — L4050 Arthropathic psoriasis, unspecified: Secondary | ICD-10-CM

## 2015-06-22 DIAGNOSIS — M25462 Effusion, left knee: Secondary | ICD-10-CM | POA: Diagnosis not present

## 2015-06-22 DIAGNOSIS — M25562 Pain in left knee: Secondary | ICD-10-CM

## 2015-06-22 DIAGNOSIS — M13162 Monoarthritis, not elsewhere classified, left knee: Secondary | ICD-10-CM | POA: Diagnosis not present

## 2015-06-22 DIAGNOSIS — F4321 Adjustment disorder with depressed mood: Secondary | ICD-10-CM | POA: Diagnosis not present

## 2015-06-22 DIAGNOSIS — M1712 Unilateral primary osteoarthritis, left knee: Secondary | ICD-10-CM

## 2015-06-22 DIAGNOSIS — M25569 Pain in unspecified knee: Secondary | ICD-10-CM | POA: Diagnosis not present

## 2015-06-22 MED ORDER — DICLOFENAC SODIUM 2 % TD SOLN: 2.0000 "application " | Freq: Two times a day (BID) | TRANSDERMAL | Status: DC

## 2015-06-22 NOTE — Patient Instructions (Addendum)
Good to see you as always  Ice 20 minutes 2 times daily. Usually after activity and before bed. pennsaid pinkie amount topically 2 times daily as needed.  Wear the brace with a lot of walking.  Avoid excessive squatting and do more low impact exercises such as elliptical and biking.  See me again in 3-4 weeks

## 2015-06-22 NOTE — Assessment & Plan Note (Signed)
Severe arthritis noted. Patient had swelling with some mild synovitis. This could be secondary to more of the psoriatic arthritis. Patient will have labs done on the fluid. We discussed anti-inflammatories, topical anti-inflammatories impression torn given, continue over-the-counter medications. We discussed icing regimen. Patient given a brace and work with Product/process development scientist to learn home exercises. We discussed proper shoes. Patient has worsening symptoms she will call otherwise will follow-up again in 3-4 weeks. If continuing to give [place] difficulty we may need to consider further workup including x-ray and potentially even an MRI there is any intra-articular symptoms.

## 2015-06-22 NOTE — Progress Notes (Signed)
Pre visit review using our clinic review tool, if applicable. No additional management support is needed unless otherwise documented below in the visit note. 

## 2015-06-22 NOTE — Assessment & Plan Note (Signed)
Patient will discuss with her rheumatologist. She is failed to different medication. This could be a flare secondary to the psoriatic arthritis and may need to consider further treatment options.

## 2015-06-22 NOTE — Progress Notes (Signed)
[name] [address], [place] [address] Phone: [phone] Subjective:    I'm seeing this patient by the request  of:  Hoyt Koch, MD  CC: Left knee pain   [id number] Tabitha [name] is a 50 y.o. female coming in with complaint of Left knee pain. Patient has a past medical history significant for severe patellofemoral arthritis as well as medial meniscal tear of the right knee. Patient did have a surgical repair. Patient also has a past medical history significant for rheumatoid arthritis. Patient states left knee has been given her pain intermittently but seemed to worsen over the course last 2 weeks. Does not rib or any true injury. Had been walking maybe a little bit more. Does have swelling intermittently. Feels that if she can rotate her leg out somewhat she has increasing pain. Worse pain with going up or downstairs. No erythema of the joint. No radiation down the leg. Has not locked or given out on her. No nighttime awakening. Rates the severity of pain a 6 out of 10.      Past Medical History  Diagnosis Date  . Dermatophytosis, scalp   . De Quervain's disease (tenosynovitis)   . Hypercholesteremia   . Polycystic ovarian disease   . Obesity   . Diabetes mellitus   . Wears glasses   . Arthritis   . Psoriatic arthritis [facility])    Past Surgical History  Procedure Laterality Date  . Wrist surgery  2001    carpel tunnel -rt  . De quervain's release  2012    left  . Wisdom tooth extraction    . Colonoscopy    . Knee arthroscopy Right 05/06/2013    Procedure: RIGHT KNEE ARTHROSCOPY ;  Surgeon: Hessie Dibble, MD;  Location: [place];  Service: Orthopedics;  Laterality: Right;  partial lateral minisectomy and chondroplasty   Social History   Social History  . Marital Status: Married    Spouse Name: N/A  . Number of Children: N/A  . Years of Education: N/A   Occupational History  . nurse    at [employer]   Social History Main Topics  . Smoking status: Former Smoker -- 1.30 packs/day    Types: Cigarettes    Start date: 06/12/1984    Quit date: 03/18/1995  . Smokeless tobacco: Never Used     Comment: Not interested in returning to smoking.   . Alcohol Use: 3.5 oz/week    7 drink(s) per week     Comment: a martini a night  . Drug Use: No  . Sexual Activity:    Partners: Male     Comment: married   Other Topics Concern  . None   Social History Narrative   Allergies  Allergen Reactions  . Chocolate Anaphylaxis  . Food     chocolate  . Statins     Intolerant  lft elevation  . Latex Rash   Family History  Problem Relation Age of Onset  . Hypertension    . Diabetes type II    . Obesity    . Diabetes Father   . Hypertension Father   . Stroke Father   . Kidney disease Father   . Cancer Mother 66    breast ca died at age 42  . Obesity Brother   . Hypertension Brother   . Heart disease Maternal Grandmother   . Alzheimer's disease Maternal Grandmother   . Diabetes Paternal Grandmother   .  Heart disease Paternal Grandfather     Past medical history, social, surgical and family history all reviewed in electronic medical record.  No pertanent information unless stated regarding to the chief complaint.   Review of Systems: No headache, visual changes, nausea, vomiting, diarrhea, constipation, dizziness, abdominal pain, skin rash, fevers, chills, night sweats, weight loss, swollen lymph nodes, body aches, joint swelling, muscle aches, chest pain, shortness of breath, mood changes.   Objective Blood pressure 134/82, pulse 99, height 5\' 3"  (1.6 m), weight 260 lb (117.935 kg), SpO2 96 %.  General: No apparent distress alert and oriented x3 mood and affect normal, dressed appropriately.  HEENT: Pupils equal, extraocular movements intact  Respiratory: Patient's speak in full sentences and does not appear short of breath  Cardiovascular: No lower extremity edema,  non tender, no erythema  Skin: Warm dry intact with no signs of infection or rash on extremities or on axial skeleton.  Abdomen: Soft nontender  Neuro: Cranial nerves II through XII are intact, neurovascularly intact in all extremities with 2+ DTRs and 2+ pulses.  Lymph: No lymphadenopathy of posterior or anterior cervical chain or axillae bilaterally.  Gait normal with good balance and coordination.  MSK:  Non tender with full range of motion and good stability and symmetric strength and tone of shoulders, elbows, wrist, hip, and ankles bilaterally.  Knee: Left Mild valgus deformity noted Tender to palpation over the medial joint line as well as the superior lateral joint line. Does feel that patient has an effusion of the patellofemoral joint at the superior patellar pouch. ROM full in flexion and extension and lower leg rotation. Ligaments with solid consistent endpoints including ACL, PCL, LCL, MCL. Equivocal Mcmurray's, Apley's, and Thessalonian tests. Moderate painful patellar compression. Patellar glide with severe crepitus. Patellar and quadriceps tendons unremarkable. Hamstring and quadriceps strength is normal.    MSK US performed of: Left knee  This study was ordered, performed, and interpreted by Charlann Boxer D.O.  Knee: All structures visualized. Joint effusion noted. Severe narrowing of the patellofemoral joint and moderate narrowing of the medial joint line. Possibly in the superior patellar pouch in the effusion there could be some calcific loose bodies noted. Patellar Tendon unremarkable on long and transverse views without effusion. No abnormality of prepatellar bursa. LCL and MCL unremarkable on long and transverse views. No abnormality of origin of medial or lateral head of the gastrocnemius.  IMPRESSION:  Knee effusion with severe patellofemoral arthritis and moderate medial compartment arthritis.  Procedure: Real-time Ultrasound Guided Injection of left  knee Device: GE Logiq E  Ultrasound guided injection is preferred based studies that show increased duration, increased effect, greater accuracy, decreased procedural pain, increased response rate, and decreased cost with ultrasound guided versus blind injection.  Verbal informed consent obtained.  Time-out conducted.  Noted no overlying erythema, induration, or other signs of local infection.  Skin prepped in a sterile fashion.  Local anesthesia: Topical Ethyl chloride.  With sterile technique and under real time ultrasound guidance: With a 22-gauge 2 inch needle patient was injected with 4 cc of 0.5% Marcaine and aspirated 25 mL of straw Berry colored fluid. Mild increase in thickness but no signs of infectious etiology. Then injected with 1 cc of Kenalog 40 mg/dL. This was from a superior lateral approach.  Completed without difficulty  Pain immediately resolved suggesting accurate placement of the medication.  Advised to call if fevers/chills, erythema, induration, drainage, or persistent bleeding.  Images permanently stored and available for review in the ultrasound  unit.  Impression: Technically successful ultrasound guided injection.  Procedure note [id number]; 15 minutes spent for Therapeutic exercises as stated in above notes.  This included exercises focusing on stretching, strengthening, with significant focus on eccentric aspects. Flexion and extension exercises focusing on vastus medialis oblique as well as hip abductor's. Hamstring stretches given as well.  Proper technique shown and discussed handout in great detail with ATC.  All questions were discussed and answered.      Impression and Recommendations:     This case required medical decision making of moderate complexity.      Note: This dictation was prepared with Dragon dictation along with smaller phrase technology. Any transcriptional errors that result from this process are unintentional.

## 2015-06-23 LAB — SYNOVIAL CELL COUNT + DIFF, W/ CRYSTALS
Eosinophils-Synovial: 0 % (ref 0–1)
Lymphocytes-Synovial Fld: 46 % — ABNORMAL HIGH (ref 0–20)
Monocyte/Macrophage: 48 % — ABNORMAL LOW (ref 50–90)
Neutrophil, Synovial: 6 % (ref 0–25)
WBC, Synovial: 985 cu mm — ABNORMAL HIGH (ref 0–200)

## 2015-06-30 DIAGNOSIS — F4321 Adjustment disorder with depressed mood: Secondary | ICD-10-CM | POA: Diagnosis not present

## 2015-06-30 MED FILL — VICTOZA 18 MG/3 ML INJECT P: 18 | 30 days supply | Qty: 9 | Fill #3

## 2015-07-08 DIAGNOSIS — F4321 Adjustment disorder with depressed mood: Secondary | ICD-10-CM | POA: Diagnosis not present

## 2015-07-12 ENCOUNTER — Other Ambulatory Visit (HOSPITAL_COMMUNITY): Payer: Self-pay | Admitting: *Deleted

## 2015-07-13 ENCOUNTER — Encounter (HOSPITAL_COMMUNITY)
Admission: RE | Admit: 2015-07-13 | Discharge: 2015-07-13 | Disposition: A | Discharge status: Home / Self Care | Payer: 59 | Source: Ambulatory Visit | Attending: Rheumatology | Admitting: Rheumatology

## 2015-07-13 DIAGNOSIS — F4321 Adjustment disorder with depressed mood: Secondary | ICD-10-CM | POA: Diagnosis not present

## 2015-07-13 DIAGNOSIS — L4050 Arthropathic psoriasis, unspecified: Secondary | ICD-10-CM | POA: Diagnosis not present

## 2015-07-13 MED ORDER — DIPHENHYDRAMINE HCL 25 MG PO CAPS: 25.0000 mg | ORAL_CAPSULE | Freq: Once | ORAL | Status: DC

## 2015-07-13 MED ORDER — ACETAMINOPHEN 325 MG PO TABS: 650.0000 mg | ORAL_TABLET | Freq: Once | ORAL | Status: DC

## 2015-07-13 MED ORDER — SODIUM CHLORIDE 0.9 % IV SOLN
6.0000 mg/kg | Freq: Once | INTRAVENOUS | Status: AC
  Administered 2015-07-13: 700 mg via INTRAVENOUS
  Filled 2015-07-13: qty 70

## 2015-07-13 MED ORDER — SODIUM CHLORIDE 0.9 % IV SOLN
Freq: Once | INTRAVENOUS | Status: AC
  Administered 2015-07-13: 09:00:00 via INTRAVENOUS

## 2015-07-19 ENCOUNTER — Encounter: Payer: Self-pay | Admitting: Family Medicine

## 2015-07-19 ENCOUNTER — Other Ambulatory Visit (INDEPENDENT_AMBULATORY_CARE_PROVIDER_SITE_OTHER): Payer: 59

## 2015-07-19 ENCOUNTER — Ambulatory Visit (INDEPENDENT_AMBULATORY_CARE_PROVIDER_SITE_OTHER)
Admission: RE | Admit: 2015-07-19 | Discharge: 2015-07-19 | Disposition: A | Discharge status: Home / Self Care | Payer: 59 | Source: Ambulatory Visit | Attending: Family Medicine | Admitting: Family Medicine

## 2015-07-19 ENCOUNTER — Ambulatory Visit (INDEPENDENT_AMBULATORY_CARE_PROVIDER_SITE_OTHER): Payer: 59 | Admitting: Family Medicine

## 2015-07-19 VITALS — BP 138/84 | HR 83 | Ht 63.0 in | Wt 258.0 lb

## 2015-07-19 DIAGNOSIS — M1712 Unilateral primary osteoarthritis, left knee: Secondary | ICD-10-CM

## 2015-07-19 DIAGNOSIS — M13162 Monoarthritis, not elsewhere classified, left knee: Secondary | ICD-10-CM

## 2015-07-19 DIAGNOSIS — M25562 Pain in left knee: Secondary | ICD-10-CM

## 2015-07-19 DIAGNOSIS — M179 Osteoarthritis of knee, unspecified: Secondary | ICD-10-CM | POA: Diagnosis not present

## 2015-07-19 DIAGNOSIS — L4050 Arthropathic psoriasis, unspecified: Secondary | ICD-10-CM

## 2015-07-19 NOTE — Assessment & Plan Note (Signed)
Patient had aspiration today. Discussed icing. Discussed home exercises. Patient may need a custom brace. Patient wants to have x-rays done first. If continuing to have instability of his imaging may be warranted. If any true locking occurs we would need to get MRI immediately. Patient is only found to have severe arthritis we may need to consider viscous supplementation. Patient declined formal physical therapy. Patient come back in 3 weeks for further evaluation and treatment.

## 2015-07-19 NOTE — Progress Notes (Signed)
Pre visit review using our clinic review tool, if applicable. No additional management support is needed unless otherwise documented below in the visit note. 

## 2015-07-19 NOTE — Patient Instructions (Addendum)
Good to see you  Ice can help in 6 huors Get xray downstairs today  Continue the pennsaid Send message in 10 days and we will discuss orthovisc or MRI.

## 2015-07-19 NOTE — Progress Notes (Signed)
[name] [name], [name] [address] Phone: [phone] Subjective:    I'm seeing this patient by the request  of:  Hoyt Koch, MD  CC: Left knee pain f/u   [id number] Tabitha [name] is a 50 y.o. female coming in with complaint of Left knee pain. Patient was found to have patellofemoral arthritis as well as a synovitis of the knee. Patient did have an injection and aspiration. States 2 weeks she was having very minimal pain. Still had some instability feeling. Patient then had her Remicade infusion and since then she started feeling more tightness of the knee again. Given her more instability. Making it difficult to do bedside care. 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 Patient still states that the pain is mostly on the medial aspect of her knee. Sometimes can give her a locking sensation.      Past Medical History  Diagnosis Date  . Dermatophytosis, scalp   . De Quervain's disease (tenosynovitis)   . Hypercholesteremia   . Polycystic ovarian disease   . Obesity   . Diabetes mellitus   . Wears glasses   . Arthritis   . Psoriatic arthritis [facility])    Past Surgical History  Procedure Laterality Date  . Wrist surgery  2001    carpel tunnel -rt  . De quervain's release  2012    left  . Wisdom tooth extraction    . Colonoscopy    . Knee arthroscopy Right 05/06/2013    Procedure: RIGHT KNEE ARTHROSCOPY ;  Surgeon: Hessie Dibble, MD;  Location: [place];  Service: Orthopedics;  Laterality: Right;  partial lateral minisectomy and chondroplasty   Social History   Social History  . Marital Status: Divorced    Spouse Name: N/A  . Number of Children: N/A  . Years of Education: N/A   Occupational History  . 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 Smoking status: Former Smoker -- 1.30 packs/day    Types: Cigarettes    Start date: 06/12/1984   Quit date: 03/18/1995  . Smokeless tobacco: Never Used     Comment: Not interested in returning to smoking.   . Alcohol Use: 3.5 oz/week    7 drink(s) per week     Comment: a martini a night  . Drug Use: No  . Sexual Activity:    Partners: Male     Comment: married   Other Topics Concern  . None   Social History Narrative   Allergies  Allergen Reactions  . Chocolate Anaphylaxis  . Food     chocolate  . Statins     Intolerant  lft elevation  . Latex Rash   Family History  Problem Relation Age of Onset  . Hypertension    . Diabetes type II    . Obesity    . Diabetes Father   . Hypertension Father   . Stroke Father   . Kidney disease Father   . Cancer Mother 70    breast ca died at age 22  . Obesity Brother   . Hypertension Brother   . Heart disease Maternal Grandmother   . Alzheimer's disease Maternal Grandmother   . Diabetes Paternal Grandmother   . Heart disease Paternal Grandfather     Past medical history, social, surgical and family history all reviewed in electronic medical record.  No pertanent information unless stated  regarding to the chief complaint.   Review of Systems: No headache, visual changes, nausea, vomiting, diarrhea, constipation, dizziness, abdominal pain, skin rash, fevers, chills, night sweats, weight loss, swollen lymph nodes, body aches, joint swelling, muscle aches, chest pain, shortness of breath, mood changes.   Objective Blood pressure 138/84, pulse 83, height 5\' 3"  (1.6 m), weight 258 lb (117.028 kg), SpO2 95 %.  General: No apparent distress alert and oriented x3 mood and affect normal, dressed appropriately.  HEENT: Pupils equal, extraocular movements intact  Respiratory: Patient's speak in full sentences and does not appear short of breath  Cardiovascular: No lower extremity edema, non tender, no erythema  Skin: Warm dry intact with no signs of infection or rash on extremities or on axial skeleton.  Abdomen: Soft nontender  Neuro:  Cranial nerves II through XII are intact, neurovascularly intact in all extremities with 2+ DTRs and 2+ pulses.  Lymph: No lymphadenopathy of posterior or anterior cervical chain or axillae bilaterally.  Gait normal with good balance and coordination.  MSK:  Non tender with full range of motion and good stability and symmetric strength and tone of shoulders, elbows, wrist, hip, and ankles bilaterally.  Knee: Left Mild valgus deformity noted Tender to palpation over the medial joint line as well as the superior lateral joint line. Does feel that patient has an effusion of the patellofemoral joint at the superior patellar pouch.possibly more than previous exam. ROM full in flexion and extension and lower leg rotation. Ligaments with solid consistent endpoints including ACL, PCL, LCL, MCL. Equivocal Mcmurray's, Apley's, and Thessalonian tests. Moderate painful patellar compression. Patellar glide with severe crepitus. Patellar and quadriceps tendons unremarkable. Hamstring and quadriceps strength is normal.  Contralateral knee minimally tender to palpation over the medial joint line but no effusion.  MSK US performed of: Left knee  This study was ordered, performed, and interpreted by Charlann Boxer D.O.  Knee: All structures visualized. Joint effusion noted. Severe narrowing of the patellofemoral joint and moderate narrowing of the medial joint line. Possibly in the superior patellar pouch in the effusion there could be some calcific loose bodies noted. Patellar Tendon unremarkable on long and transverse views without effusion. No abnormality of prepatellar bursa. LCL and MCL unremarkable on long and transverse views. No abnormality of origin of medial or lateral head of the gastrocnemius.  IMPRESSION:  Knee effusion with severe patellofemoral arthritis and moderate medial compartment arthritis. No change improved his exam  Procedure: Real-time Ultrasound Guided Injection of left  knee Device: GE Logiq E  Ultrasound guided injection is preferred based studies that show increased duration, increased effect, greater accuracy, decreased procedural pain, increased response rate, and decreased cost with ultrasound guided versus blind injection.  Verbal informed consent obtained.  Time-out conducted.  Noted no overlying erythema, induration, or other signs of local infection.  Skin prepped in a sterile fashion.  Local anesthesia: Topical Ethyl chloride.  With sterile technique and under real time ultrasound guidance: With a 22-gauge 2 inch needle patient was injected with 4 cc of 0.5% Marcaine and removed 30 mL of strawlike fluid then injected 1 cc of Kenalog 40 mg/dL. This was from a superior lateral approach.  Completed without difficulty  Pain immediately resolved suggesting accurate placement of the medication.  Advised to call if fevers/chills, erythema, induration, drainage, or persistent bleeding.  Images permanently stored and available for review in the ultrasound unit.  Impression: Technically successful ultrasound guided injection.    Impression and Recommendations:     This  case required medical decision making of moderate complexity.      Note: This dictation was prepared with Dragon dictation along with smaller phrase technology. Any transcriptional errors that result from this process are unintentional.

## 2015-07-19 NOTE — Assessment & Plan Note (Signed)
Concern the patient's synovitis is secondary to more of the psoriatic arthritis. I do feel that x-rays are needed and we may need to consider MRI for further if patient does not make any drastic improvement. Discuss compression and bracing. Patient will continue the other supplementations.

## 2015-07-20 ENCOUNTER — Ambulatory Visit: Payer: Self-pay

## 2015-07-27 ENCOUNTER — Other Ambulatory Visit: Payer: Self-pay | Admitting: Internal Medicine

## 2015-07-27 MED FILL — VICTOZA 18 MG/3 ML INJECT P: 18 | 90 days supply | Qty: 27 | Fill #0

## 2015-07-27 MED FILL — BD PEN NDL NANO 32GX5/32: 32G X 4 MM | 90 days supply | Qty: 100 | Fill #2

## 2015-07-29 DIAGNOSIS — F4321 Adjustment disorder with depressed mood: Secondary | ICD-10-CM | POA: Diagnosis not present

## 2015-08-05 ENCOUNTER — Encounter: Payer: Self-pay | Admitting: Family Medicine

## 2015-08-05 ENCOUNTER — Other Ambulatory Visit: Payer: Self-pay

## 2015-08-05 VITALS — BP 134/78 | HR 78 | Resp 16 | Ht 63.0 in | Wt 258.0 lb

## 2015-08-05 DIAGNOSIS — E119 Type 2 diabetes mellitus without complications: Secondary | ICD-10-CM

## 2015-08-05 NOTE — Patient Instructions (Signed)
1. Plan to check blood sugar daily either fasting in AM or 1 1/2 -2 hrs after eating and record.  Goals are 80-130 fasting and less than 180 after eating. 2. Try to eat 30-45 gms of carbohydrate per meal and 15 gm with snack.  Plan to eat more protein in diet and carry snacks.  Eat breakfast regularly. 3. Plan to walk dogs 3 times a week for 30 minutes and continue to garden 3 times a week for 30 minutes.   4. Plan to keep appointment with Dr. Buddy Duty on 08/19/15 5. Plan to return to [facility] on  10/28/15 at 10:30 AM

## 2015-08-05 NOTE — Patient Outreach (Signed)
[facility]) Care Management   08/05/2015  Tabitha [name] Mar 08, 1966 [id number]  Tabitha [name] is an 50 y.o. female.   Member seen for follow up office visit for [facility] program for self management of Type 2 diabetes  Subjective: Member states that she is now in the [employer]  States she is having some technical problems with her glucometer and activity tracker.  States she is now trying to walk 3 times a week for 30-45 minutes.  States she is trying to watch the CHO in her diet but she could work on her portion sizes better.  States she has had a few rounds of steroids but her blood sugars have not been too elevated at those times.  States her fasting sugars range 120-140 and after meal readings can be from 70-160 depending on the time of day.  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  Objective:   Review of Systems  Musculoskeletal: Positive for joint pain.    Physical Exam Today's Vitals   08/05/15 1029 08/05/15 1042  BP: 134/78   Pulse: 78   Resp: 16   Height: 1.6 m (5\' 3" )   Weight: 258 lb (117.028 kg)   SpO2: 97%   PainSc: 4  4    Current Medications:   Current Outpatient Prescriptions  Medication Sig Dispense Refill  . albuterol (PROVENTIL HFA;VENTOLIN HFA) 108 (90 BASE) MCG/ACT inhaler Inhale 2 puffs into the lungs every 6 (six) hours as needed for wheezing or shortness of breath. 1 Inhaler 2  . aspirin EC 81 MG tablet Take 81 mg by mouth daily.    . Canagliflozin-Metformin HCl 50-1000 MG TABS Take 1 tablet by mouth 2 (two) times daily.    . clobetasol cream (TEMOVATE) [id number] % Apply 1 application topically 2 (two) times daily.    . Diclofenac Sodium (PENNSAID) 2 % SOLN Place 2 application onto the skin 2 (two) times daily. [id number] g 3  . folic acid (FOLVITE) 1 MG tablet Take 1 tablet (1 mg total) by mouth 2 (two) times daily.    Marland Kitchen  inFLIXimab (REMICADE) 100 MG injection Inject into the vein.    . Methotrexate Sodium, PF, 50 MG/2ML SOLN Inject 7.5 mg as directed once a week. On Friday    . Omega-3 Fatty Acids (FISH OIL) 1200 MG CAPS Take 1-2 capsules (1,200-2,400 mg total) by mouth 2 (two) times daily. Takes 2400 mg qam and 1200 mg qpm    . TraMADol HCl 50 MG TBDP Take 1 tablet by mouth every 8 (eight) hours as needed. 90 tablet 3  . TURMERIC PO Take by mouth 2 (two) times daily.     Marland Kitchen VICTOZA 18 MG/3ML SOPN INJECT 1.8 MG INTO THE SKIN AS DIRECTED DAILY. 9 mL 3  . Vitamin D, Ergocalciferol, (DRISDOL) 50000 UNITS CAPS capsule Take 1 capsule (50,000 Units total) by mouth every 7 (seven) days. (Patient not taking: Reported on 08/05/2015) 12 capsule 0   No current facility-administered medications for this visit.    Functional Status:   In your present state of health, do you have any difficulty performing the following activities: 08/05/2015 07/13/2015  Hearing? N N  Vision? N N  Difficulty concentrating or making decisions? N N  Walking or climbing stairs? N N  Dressing or bathing? N N  Doing errands, shopping?  N -    Fall/Depression Screening:    PHQ 2/9 Scores 08/05/2015 04/13/2015 12/15/2014  PHQ - 2 Score 0 0 1    Assessment:   Member seen for follow up office visit for [facility] program for self management of Type 2 diabetes.  Member is meeting diabetes goal of hemoglobin A1C with last reading 5.9 which is decreased from 6.9.  Member is to see Dr.Kerr on 08/19/15.  Member is now in [employer]   Member reports following low CHO diet and has increased activity. Reports tolerating medications without adverse effects.   Plan:  Plan to check blood sugar daily either fasting in AM or 1 1/2 -2 hrs after eating and record.  Goals are 80-130 fasting and less than 180 after eating. Try to eat 30-45 gms of carbohydrate per meal and 15 gm with snack.  Plan to eat more protein in diet and carry snacks.  Eat  breakfast regularly. Plan to walk dogs 3 times a week for 30 minutes and continue to garden 3 times a week for 30 minutes.   Plan to keep appointment with Dr. Buddy Duty on 08/19/15 Plan to return to [facility] on  10/28/15 at 10:30 AM  [facility] CM Care Plan Problem One        Most Recent Value   Care Plan Problem One  Potential for elevated blood sugars related to dx of Type 2 DM   Role Documenting the Problem One  Care Management Coordinator   Care Plan for Problem One  Active   [facility] Long Term Goal (31-90 days)  Member will maintain hemoglobin A1C at or below 7 for the next 90 days   [facility] Long Term Goal Start Date  08/05/15 [Maintaining hemoglobin A1C with last reading 5.9 recheck 3/9]   Interventions for Problem One Long Term Goal  Reinforced on CHO counting and portion control, Discussed trying to eat a snack with protein and 15gm of CHO at bedtime to see if that will help with her higher blood sugars in the AM,  Reinforced  importance of regular exercise for glycemic control, Encouraged to be active in the [employer], Introduced to [employer] nurse who gave member new glucometer and advised her to contact technical support about issues,   Encouraged to go back to water aerobics, Reinforced benefits of weight loss     Peter Garter RN, [facility] Care Management Coordinator-[facility] to [place] Management [phone]

## 2015-08-09 DIAGNOSIS — F4321 Adjustment disorder with depressed mood: Secondary | ICD-10-CM | POA: Diagnosis not present

## 2015-08-19 ENCOUNTER — Encounter: Payer: Self-pay | Admitting: Family Medicine

## 2015-08-19 ENCOUNTER — Ambulatory Visit (INDEPENDENT_AMBULATORY_CARE_PROVIDER_SITE_OTHER): Payer: 59 | Admitting: Family Medicine

## 2015-08-19 VITALS — BP 124/80 | HR 75 | Ht 63.0 in

## 2015-08-19 DIAGNOSIS — Z6841 Body Mass Index (BMI) 40.0 and over, adult: Secondary | ICD-10-CM | POA: Diagnosis not present

## 2015-08-19 DIAGNOSIS — Z79899 Other long term (current) drug therapy: Secondary | ICD-10-CM | POA: Diagnosis not present

## 2015-08-19 DIAGNOSIS — Z794 Long term (current) use of insulin: Secondary | ICD-10-CM | POA: Diagnosis not present

## 2015-08-19 DIAGNOSIS — M1712 Unilateral primary osteoarthritis, left knee: Secondary | ICD-10-CM

## 2015-08-19 DIAGNOSIS — F4321 Adjustment disorder with depressed mood: Secondary | ICD-10-CM | POA: Diagnosis not present

## 2015-08-19 DIAGNOSIS — M13162 Monoarthritis, not elsewhere classified, left knee: Secondary | ICD-10-CM

## 2015-08-19 DIAGNOSIS — E119 Type 2 diabetes mellitus without complications: Secondary | ICD-10-CM | POA: Diagnosis not present

## 2015-08-19 DIAGNOSIS — Z5181 Encounter for therapeutic drug level monitoring: Secondary | ICD-10-CM | POA: Diagnosis not present

## 2015-08-19 MED FILL — INVOKAMET 50-1,000 MG TAB: 50-1000 | 90 days supply | Qty: 180 | Fill #3

## 2015-08-19 NOTE — Patient Instructions (Signed)
Good to see you  Keep working at it you are doing great  Hopefully this will give [place] at least 6 months or so.  I would send me a message in 4 weeks jso I know how you are doing.

## 2015-08-19 NOTE — Progress Notes (Signed)
Pre visit review using our clinic review tool, if applicable. No additional management support is needed unless otherwise documented below in the visit note. 

## 2015-08-19 NOTE — Assessment & Plan Note (Signed)
Patient was given viscous supplementation today. Over this will be beneficial for this individual. Encourage her to continue home exercises in the icing pedicle. Patient was avoid any type of surgical intervention at this time. Patient will follow-up again in 4-6 weeks for further evaluation. Exercise prescription given. Continue with topical anti-inflammatories which was refilled today.  Spent  25 minutes with patient face-to-face and had greater than 50% of counseling including as described above in assessment and plan.

## 2015-08-19 NOTE — Progress Notes (Signed)
[name] [place], [place] [address] Phone: [phone] Subjective:     CC: Left knee pain f/u   [id number] Tabitha [name] is a 50 y.o. female coming in with complaint of Left knee pain. Patient was found to have patellofemoral arthritis as well as a synovitis of the knee. Patient did have an injection and aspiration.  Patient was doing somewhat better but started having worsening pain again. Patient states that it's affecting some of her daily activities. States that the swelling is causing her to decrease her range of motion. Finds herself finding it difficult to find a couple position at night.      Past Medical History  Diagnosis Date  . Dermatophytosis, scalp   . De Quervain's disease (tenosynovitis)   . Hypercholesteremia   . Polycystic ovarian disease   . Obesity   . Diabetes mellitus   . Wears glasses   . Arthritis   . Psoriatic arthritis [facility])    Past Surgical History  Procedure Laterality Date  . Wrist surgery  2001    carpel tunnel -rt  . De quervain's release  2012    left  . Wisdom tooth extraction    . Colonoscopy    . Knee arthroscopy Right 05/06/2013    Procedure: RIGHT KNEE ARTHROSCOPY ;  Surgeon: Hessie Dibble, MD;  Location: [place];  Service: Orthopedics;  Laterality: Right;  partial lateral minisectomy and chondroplasty   Social History   Social History  . Marital Status: Divorced    Spouse Name: N/A  . Number of Children: N/A  . Years of Education: N/A   Occupational History  . 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 Smoking status: Former Smoker -- 1.30 packs/day    Types: Cigarettes    Start date: 06/12/1984    Quit date: 03/18/1995  . Smokeless tobacco: Never Used     Comment: Not interested in returning to smoking.   . Alcohol Use: 3.5 oz/week    7 drink(s) per week     Comment: a martini a night  . Drug Use: No  . Sexual Activity:      Partners: Male     Comment: married   Other Topics Concern  . None   Social History Narrative   Allergies  Allergen Reactions  . Chocolate Anaphylaxis  . Food     chocolate  . Statins     Intolerant  lft elevation  . Latex Rash   Family History  Problem Relation Age of Onset  . Hypertension    . Diabetes type II    . Obesity    . Diabetes Father   . Hypertension Father   . Stroke Father   . Kidney disease Father   . Cancer Mother 58    breast ca died at age 73  . Obesity Brother   . Hypertension Brother   . Heart disease Maternal Grandmother   . Alzheimer's disease Maternal Grandmother   . Diabetes Paternal Grandmother   . Heart disease Paternal Grandfather     Past medical history, social, surgical and family history all reviewed in electronic medical record.  No pertanent information unless stated regarding to the chief complaint.   Review of Systems: No headache, visual changes, nausea, vomiting, diarrhea, constipation, dizziness, abdominal pain, skin rash, fevers, chills, night sweats, weight loss, swollen lymph nodes, body aches, joint swelling, muscle aches, chest  pain, shortness of breath, mood changes.   Objective Blood pressure 124/80, pulse 75, height 5\' 3"  (1.6 m), SpO2 97 %.  General: No apparent distress alert and oriented x3 mood and affect normal, dressed appropriately.  HEENT: Pupils equal, extraocular movements intact  Respiratory: Patient's speak in full sentences and does not appear short of breath  Cardiovascular: No lower extremity edema, non tender, no erythema  Skin: Warm dry intact with no signs of infection or rash on extremities or on axial skeleton.  Abdomen: Soft nontender  Neuro: Cranial nerves II through XII are intact, neurovascularly intact in all extremities with 2+ DTRs and 2+ pulses.  Lymph: No lymphadenopathy of posterior or anterior cervical chain or axillae bilaterally.  Gait normal with good balance and coordination.  MSK:   Non tender with full range of motion and good stability and symmetric strength and tone of shoulders, elbows, wrist, hip, and ankles bilaterally.  Knee: Left Mild valgus deformity noted Tender to palpation over the medial joint line as well as the superior lateral joint line. Does feel that patient has an effusion of the patellofemoral joint at the superior patellar pouch.possibly more than previous exam. ROM full in flexion and extension and lower leg rotation. Ligaments with solid consistent endpoints including ACL, PCL, LCL, MCL. Positive Mcmurray's, Apley's, and Thessalonian tests. Moderate painful patellar compression. Patellar glide with severe crepitus. Patellar and quadriceps tendons unremarkable. Hamstring and quadriceps strength is normal.  Contralateral knee minimally tender to palpation over the medial joint line but no effusion. No significant change from previous exam   After informed written and verbal consent, patient was seated on exam table. Left knee was prepped with alcohol swab and utilizing anterolateral approach, patient's left knee space was aspirated 30 mL of strawlike fluid then injected with 25 mg/1 mL of Monovisc (sodium hyaluronate) in a prefilled syringe was injected easily into the knee through a 22-gauge needle. Patient tolerated the procedure well without immediate complications.    Impression and Recommendations:     This case required medical decision making of moderate complexity.

## 2015-08-23 ENCOUNTER — Other Ambulatory Visit (HOSPITAL_COMMUNITY): Payer: Self-pay | Admitting: *Deleted

## 2015-08-24 ENCOUNTER — Ambulatory Visit (HOSPITAL_COMMUNITY)
Admission: RE | Admit: 2015-08-24 | Discharge: 2015-08-24 | Disposition: A | Discharge status: Home / Self Care | Payer: 59 | Source: Ambulatory Visit | Attending: Rheumatology | Admitting: Rheumatology

## 2015-08-24 DIAGNOSIS — L4050 Arthropathic psoriasis, unspecified: Secondary | ICD-10-CM | POA: Insufficient documentation

## 2015-08-24 LAB — CBC WITH DIFFERENTIAL/PLATELET
Basophils Absolute: 0.1 10*3/uL (ref 0.0–0.1)
Basophils Relative: 1 %
Eosinophils Absolute: 0.3 10*3/uL (ref 0.0–0.7)
Eosinophils Relative: 3 %
HCT: 40.3 % (ref 36.0–46.0)
Hemoglobin: 12.5 g/dL (ref 12.0–15.0)
Lymphocytes Relative: 21 %
Lymphs Abs: 1.8 10*3/uL (ref 0.7–4.0)
MCH: 28 pg (ref 26.0–34.0)
MCHC: 31 g/dL (ref 30.0–36.0)
MCV: 90.2 fL (ref 78.0–100.0)
Monocytes Absolute: 0.7 10*3/uL (ref 0.1–1.0)
Monocytes Relative: 8 %
Neutro Abs: 5.7 10*3/uL (ref 1.7–7.7)
Neutrophils Relative %: 67 %
Platelets: 246 10*3/uL (ref 150–400)
RBC: 4.47 MIL/uL (ref 3.87–5.11)
RDW: 13.1 % (ref 11.5–15.5)
WBC: 8.5 10*3/uL (ref 4.0–10.5)

## 2015-08-24 MED ORDER — SODIUM CHLORIDE 0.9 % IV SOLN: Freq: Once | INTRAVENOUS | Status: DC

## 2015-08-24 MED ORDER — DIPHENHYDRAMINE HCL 25 MG PO CAPS: 25.0000 mg | ORAL_CAPSULE | Freq: Once | ORAL | Status: DC

## 2015-08-24 MED ORDER — ACETAMINOPHEN 325 MG PO TABS: 650.0000 mg | ORAL_TABLET | Freq: Once | ORAL | Status: DC

## 2015-08-24 MED ORDER — SODIUM CHLORIDE 0.9 % IV SOLN
6.0000 mg/kg | Freq: Once | INTRAVENOUS | Status: AC
  Administered 2015-08-24: 700 mg via INTRAVENOUS
  Filled 2015-08-24: qty 70

## 2015-08-25 DIAGNOSIS — R040 Epistaxis: Secondary | ICD-10-CM | POA: Diagnosis not present

## 2015-08-25 DIAGNOSIS — F4321 Adjustment disorder with depressed mood: Secondary | ICD-10-CM | POA: Diagnosis not present

## 2015-08-25 DIAGNOSIS — J342 Deviated nasal septum: Secondary | ICD-10-CM | POA: Diagnosis not present

## 2015-08-25 DIAGNOSIS — J302 Other seasonal allergic rhinitis: Secondary | ICD-10-CM | POA: Diagnosis not present

## 2015-09-02 DIAGNOSIS — F4321 Adjustment disorder with depressed mood: Secondary | ICD-10-CM | POA: Diagnosis not present

## 2015-09-07 DIAGNOSIS — F4321 Adjustment disorder with depressed mood: Secondary | ICD-10-CM | POA: Diagnosis not present

## 2015-09-07 MED FILL — traMADol HCL 50 MG TABS: 50 | 30 days supply | Qty: 90 | Fill #0

## 2015-09-13 DIAGNOSIS — M545 Low back pain: Secondary | ICD-10-CM | POA: Diagnosis not present

## 2015-09-13 DIAGNOSIS — M546 Pain in thoracic spine: Secondary | ICD-10-CM | POA: Diagnosis not present

## 2015-09-13 DIAGNOSIS — M5416 Radiculopathy, lumbar region: Secondary | ICD-10-CM | POA: Diagnosis not present

## 2015-09-13 DIAGNOSIS — R202 Paresthesia of skin: Secondary | ICD-10-CM | POA: Diagnosis not present

## 2015-09-16 DIAGNOSIS — M546 Pain in thoracic spine: Secondary | ICD-10-CM | POA: Diagnosis not present

## 2015-09-16 DIAGNOSIS — R202 Paresthesia of skin: Secondary | ICD-10-CM | POA: Diagnosis not present

## 2015-09-16 DIAGNOSIS — M545 Low back pain: Secondary | ICD-10-CM | POA: Diagnosis not present

## 2015-09-16 DIAGNOSIS — M5416 Radiculopathy, lumbar region: Secondary | ICD-10-CM | POA: Diagnosis not present

## 2015-09-21 DIAGNOSIS — M79671 Pain in right foot: Secondary | ICD-10-CM | POA: Diagnosis not present

## 2015-09-21 DIAGNOSIS — M79672 Pain in left foot: Secondary | ICD-10-CM | POA: Diagnosis not present

## 2015-09-21 DIAGNOSIS — M79642 Pain in left hand: Secondary | ICD-10-CM | POA: Diagnosis not present

## 2015-09-21 DIAGNOSIS — M25562 Pain in left knee: Secondary | ICD-10-CM | POA: Diagnosis not present

## 2015-09-21 DIAGNOSIS — M545 Low back pain: Secondary | ICD-10-CM | POA: Diagnosis not present

## 2015-09-21 DIAGNOSIS — M25561 Pain in right knee: Secondary | ICD-10-CM | POA: Diagnosis not present

## 2015-09-21 DIAGNOSIS — M79641 Pain in right hand: Secondary | ICD-10-CM | POA: Diagnosis not present

## 2015-09-22 DIAGNOSIS — M546 Pain in thoracic spine: Secondary | ICD-10-CM | POA: Diagnosis not present

## 2015-09-22 DIAGNOSIS — M545 Low back pain: Secondary | ICD-10-CM | POA: Diagnosis not present

## 2015-09-22 DIAGNOSIS — F4321 Adjustment disorder with depressed mood: Secondary | ICD-10-CM | POA: Diagnosis not present

## 2015-09-22 DIAGNOSIS — R202 Paresthesia of skin: Secondary | ICD-10-CM | POA: Diagnosis not present

## 2015-09-22 DIAGNOSIS — M5416 Radiculopathy, lumbar region: Secondary | ICD-10-CM | POA: Diagnosis not present

## 2015-09-28 DIAGNOSIS — M5416 Radiculopathy, lumbar region: Secondary | ICD-10-CM | POA: Diagnosis not present

## 2015-09-28 DIAGNOSIS — M545 Low back pain: Secondary | ICD-10-CM | POA: Diagnosis not present

## 2015-09-28 DIAGNOSIS — R202 Paresthesia of skin: Secondary | ICD-10-CM | POA: Diagnosis not present

## 2015-09-28 DIAGNOSIS — M546 Pain in thoracic spine: Secondary | ICD-10-CM | POA: Diagnosis not present

## 2015-09-30 DIAGNOSIS — F4321 Adjustment disorder with depressed mood: Secondary | ICD-10-CM | POA: Diagnosis not present

## 2015-10-01 MED FILL — FOLIC ACID 1 MG TABLET: 1 | 90 days supply | Qty: 90 | Fill #0

## 2015-10-01 MED FILL — METHOTREXATE 25 MG/ML VIAL: 50 | 84 days supply | Qty: 6 | Fill #0

## 2015-10-05 ENCOUNTER — Ambulatory Visit (HOSPITAL_COMMUNITY): Admission: RE | Admit: 2015-10-05 | Payer: 59 | Source: Ambulatory Visit

## 2015-10-05 DIAGNOSIS — R202 Paresthesia of skin: Secondary | ICD-10-CM | POA: Diagnosis not present

## 2015-10-05 DIAGNOSIS — M546 Pain in thoracic spine: Secondary | ICD-10-CM | POA: Diagnosis not present

## 2015-10-05 DIAGNOSIS — M545 Low back pain: Secondary | ICD-10-CM | POA: Diagnosis not present

## 2015-10-05 DIAGNOSIS — M5416 Radiculopathy, lumbar region: Secondary | ICD-10-CM | POA: Diagnosis not present

## 2015-10-06 ENCOUNTER — Other Ambulatory Visit (HOSPITAL_COMMUNITY): Payer: Self-pay | Admitting: *Deleted

## 2015-10-07 ENCOUNTER — Ambulatory Visit (HOSPITAL_COMMUNITY)
Admission: RE | Admit: 2015-10-07 | Discharge: 2015-10-07 | Disposition: A | Discharge status: Home / Self Care | Payer: 59 | Source: Ambulatory Visit | Attending: Rheumatology | Admitting: Rheumatology

## 2015-10-07 DIAGNOSIS — L4050 Arthropathic psoriasis, unspecified: Secondary | ICD-10-CM | POA: Insufficient documentation

## 2015-10-07 DIAGNOSIS — F4321 Adjustment disorder with depressed mood: Secondary | ICD-10-CM | POA: Diagnosis not present

## 2015-10-07 LAB — COMPREHENSIVE METABOLIC PANEL
ALT: 63 U/L — ABNORMAL HIGH (ref 14–54)
AST: 39 U/L (ref 15–41)
Albumin: 3.5 g/dL (ref 3.5–5.0)
Alkaline Phosphatase: 67 U/L (ref 38–126)
Anion gap: 11 (ref 5–15)
BUN: 15 mg/dL (ref 6–20)
CO2: 23 mmol/L (ref 22–32)
Calcium: 9.3 mg/dL (ref 8.9–10.3)
Chloride: 106 mmol/L (ref 101–111)
Creatinine, Ser: 0.7 mg/dL (ref 0.44–1.00)
GFR calc Af Amer: >60 mL/min (ref >60)
GFR calc non Af Amer: >60 mL/min (ref >60)
Glucose, Bld: 162 mg/dL — ABNORMAL HIGH (ref 65–99)
Potassium: 4.4 mmol/L (ref 3.5–5.1)
Sodium: 140 mmol/L (ref 135–145)
Total Bilirubin: 0.5 mg/dL (ref 0.3–1.2)
Total Protein: 6.7 g/dL (ref 6.5–8.1)

## 2015-10-07 LAB — CBC
HCT: 38.8 % (ref 36.0–46.0)
Hemoglobin: 12.2 g/dL (ref 12.0–15.0)
MCH: 27 pg (ref 26.0–34.0)
MCHC: 31.4 g/dL (ref 30.0–36.0)
MCV: 85.8 fL (ref 78.0–100.0)
Platelets: 245 10*3/uL (ref 150–400)
RBC: 4.52 MIL/uL (ref 3.87–5.11)
RDW: 14.8 % (ref 11.5–15.5)
WBC: 8.2 10*3/uL (ref 4.0–10.5)

## 2015-10-07 LAB — DIFFERENTIAL
Basophils Absolute: 0.1 10*3/uL (ref 0.0–0.1)
Basophils Relative: 1 %
Eosinophils Absolute: 0.2 10*3/uL (ref 0.0–0.7)
Eosinophils Relative: 3 %
Lymphocytes Relative: 23 %
Lymphs Abs: 1.9 10*3/uL (ref 0.7–4.0)
Monocytes Absolute: 0.7 10*3/uL (ref 0.1–1.0)
Monocytes Relative: 9 %
Neutro Abs: 5.3 10*3/uL (ref 1.7–7.7)
Neutrophils Relative %: 64 %

## 2015-10-07 MED ORDER — SODIUM CHLORIDE 0.9 % IV SOLN
INTRAVENOUS | Status: DC
  Administered 2015-10-07: 09:00:00 via INTRAVENOUS

## 2015-10-07 MED ORDER — ACETAMINOPHEN 325 MG PO TABS: 650.0000 mg | ORAL_TABLET | ORAL | Status: DC

## 2015-10-07 MED ORDER — SODIUM CHLORIDE 0.9 % IV SOLN
6.0000 mg/kg | INTRAVENOUS | Status: DC
  Administered 2015-10-07: 700 mg via INTRAVENOUS
  Filled 2015-10-07: qty 70

## 2015-10-07 MED ORDER — DIPHENHYDRAMINE HCL 25 MG PO CAPS: 25.0000 mg | ORAL_CAPSULE | ORAL | Status: DC

## 2015-10-12 ENCOUNTER — Encounter (HOSPITAL_COMMUNITY): Payer: 59

## 2015-10-14 DIAGNOSIS — F4321 Adjustment disorder with depressed mood: Secondary | ICD-10-CM | POA: Diagnosis not present

## 2015-10-20 DIAGNOSIS — F4321 Adjustment disorder with depressed mood: Secondary | ICD-10-CM | POA: Diagnosis not present

## 2015-10-27 MED FILL — VICTOZA 18 MG/3 ML INJECT P: 18 | 30 days supply | Qty: 9 | Fill #1

## 2015-10-27 MED FILL — UNIFINE PENTIPS 32GX5/32: 32G X 4 MM | 90 days supply | Qty: 100 | Fill #3

## 2015-10-28 ENCOUNTER — Ambulatory Visit: Payer: Self-pay

## 2015-11-04 DIAGNOSIS — F4321 Adjustment disorder with depressed mood: Secondary | ICD-10-CM | POA: Diagnosis not present

## 2015-11-15 ENCOUNTER — Other Ambulatory Visit (HOSPITAL_COMMUNITY): Payer: Self-pay | Admitting: *Deleted

## 2015-11-16 ENCOUNTER — Ambulatory Visit (HOSPITAL_COMMUNITY)
Admission: RE | Admit: 2015-11-16 | Discharge: 2015-11-16 | Disposition: A | Discharge status: Home / Self Care | Payer: 59 | Source: Ambulatory Visit | Attending: Rheumatology | Admitting: Rheumatology

## 2015-11-16 DIAGNOSIS — L4050 Arthropathic psoriasis, unspecified: Secondary | ICD-10-CM | POA: Diagnosis not present

## 2015-11-16 MED ORDER — ACETAMINOPHEN 325 MG PO TABS: 650.0000 mg | ORAL_TABLET | ORAL | Status: DC

## 2015-11-16 MED ORDER — DIPHENHYDRAMINE HCL 25 MG PO CAPS: 25.0000 mg | ORAL_CAPSULE | ORAL | Status: DC

## 2015-11-16 MED ORDER — SODIUM CHLORIDE 0.9 % IV SOLN
INTRAVENOUS | Status: DC
  Administered 2015-11-16: 09:00:00 via INTRAVENOUS

## 2015-11-16 MED ORDER — SODIUM CHLORIDE 0.9 % IV SOLN
6.0000 mg/kg | INTRAVENOUS | Status: DC
  Administered 2015-11-16: 700 mg via INTRAVENOUS
  Filled 2015-11-16: qty 70

## 2015-11-18 MED FILL — INVOKAMET 50-1,000 MG TAB: 50-1000 | 90 days supply | Qty: 180 | Fill #0

## 2015-11-24 DIAGNOSIS — F4321 Adjustment disorder with depressed mood: Secondary | ICD-10-CM | POA: Diagnosis not present

## 2015-11-25 ENCOUNTER — Other Ambulatory Visit: Payer: Self-pay

## 2015-11-25 VITALS — BP 128/70 | HR 74 | Resp 16 | Ht 63.0 in | Wt 264.8 lb

## 2015-11-25 DIAGNOSIS — E119 Type 2 diabetes mellitus without complications: Secondary | ICD-10-CM

## 2015-11-25 NOTE — Patient Instructions (Signed)
Plan to check blood sugar daily either fasting in AM or 1 1/2 -2 hrs after eating and record.  Goals are 80-130 fasting and less than 180 after eating. Try to eat 30-45 gms of carbohydrate per meal and 15 gm with snack.  Plan to eat more protein in diet and carry snacks.  Eat breakfast regularly. Plan to sign up for the Bariatric Exercise Lifestyle Transformation program and continue to garden 4-5 times a week for 30 minutes.   Plan to keep appointment with Dr. Buddy Duty 02/23/16 Plan to return to Link to Wellness on  03/09/16 at [facility]

## 2015-11-25 NOTE — Patient Outreach (Signed)
[facility]) Care Management   11/25/2015  Tabitha [name] 10-Jun-1966 [id number]  Tabitha [name] is an 50 y.o. female.   Member seen for follow up office visit for Link to Wellness program for self management of Type 2 diabetes  Subjective: Member states that her grandmother passed away in 08/24/22 and she has been dealing well with her death.  States she continues to see her counselor which has helped her with her grief and her pending divorce.  States that she saw Dr. Buddy Duty on 08/19/15 and her hemoglobin A1C was 6%.  States that her cholesterol was up slightly and she has decided to wait before starting a statin.  States she is trying to watch the CHO in her diet.  States she is gardening more for her exercise.  States she is thinking about signing up for the Bariatric Exercise Lifestyle Transformation Program.     Objective:   Review of Systems  Musculoskeletal: Positive for joint pain.  All other systems reviewed and are negative.   Physical Exam Today's Vitals   11/25/15 1316 11/25/15 1324  BP: 128/70   Pulse: 74   Resp: 16   Height: 1.6 m (5\' 3" )   Weight: 264 lb 12.8 oz (120.112 kg)   SpO2: 98%   PainSc: 2  2    Encounter Medications:   Outpatient Encounter Prescriptions as of 11/25/2015  Medication Sig Note  . albuterol (PROVENTIL HFA;VENTOLIN HFA) 108 (90 BASE) MCG/ACT inhaler Inhale 2 puffs into the lungs every 6 (six) hours as needed for wheezing or shortness of breath.   Marland Kitchen aspirin EC 81 MG tablet Take 81 mg by mouth daily.   . Canagliflozin-Metformin HCl 50-1000 MG TABS Take 1 tablet by mouth 2 (two) times daily.   . clobetasol cream (TEMOVATE) [id number] % Apply 1 application topically 2 (two) times daily.   . Diclofenac Sodium (PENNSAID) 2 % SOLN Place 2 application onto the skin 2 (two) times daily.   . folic acid (FOLVITE) 1 MG tablet Take 1 tablet (1 mg total) by mouth 2 (two) times daily.   Marland Kitchen inFLIXimab (REMICADE) 100 MG injection Inject into the vein.   Marland Kitchen MILK  THISTLE-DAND-FENNEL-LICOR PO Take 1 capsule by mouth daily.   . Omega-3 Fatty Acids (FISH OIL) 1200 MG CAPS Take 1-2 capsules (1,200-2,400 mg total) by mouth 2 (two) times daily. Takes 2400 mg qam and 1200 mg qpm   . TraMADol HCl 50 MG TBDP Take 1 tablet by mouth every 8 (eight) hours as needed. 03/12/2012: Occasional use  . TURMERIC PO Take by mouth 2 (two) times daily.    Marland Kitchen VICTOZA 18 MG/3ML SOPN INJECT 1.8 MG INTO THE SKIN AS DIRECTED DAILY.   Marland Kitchen Methotrexate Sodium, PF, 50 MG/2ML SOLN Inject 10 mg as directed once a week. On Friday 03/12/2012: Patient reports decreased blood counts with 20 mg of methotrexate weekly. Tolerating 15 mg dose.   No facility-administered encounter medications on file as of 11/25/2015.    Functional Status:   In your present state of health, do you have any difficulty performing the following activities: 11/25/2015 11/16/2015  Hearing? N N  Vision? N N  Difficulty concentrating or making decisions? N N  Walking or climbing stairs? N N  Dressing or bathing? N N  Doing errands, shopping? N -    Fall/Depression Screening:    PHQ 2/9 Scores 11/25/2015 08/05/2015 04/13/2015 12/15/2014  PHQ - 2 Score 0 0 0 1    Assessment:  Member  seen for follow up office visit for Link to Wellness program for self management of Type 2 diabetes. Member is meeting diabetes goal of hemoglobin A1C less than 7% with last reading 6% Member is to see Dr.Kerr on 02/13/16. Member reports following low CHO diet and has increased activity. Reports tolerating medications without adverse effects.  Member had been having more stress with the death of her 52 and pending divorce but reports improvement in the last month.  Member meets regularly with a counselor.   Plan:  Plan to check blood sugar daily either fasting in AM or 1 1/2 -2 hrs after eating and record.  Goals are 80-130 fasting and less than 180 after eating. Try to eat 30-45 gms of carbohydrate per meal and 15 gm with snack.   Plan to eat more protein in diet and carry snacks.  Eat breakfast regularly. Plan to sign up for the Bariatric Exercise Lifestyle Transformation program and continue to garden 4-5 times a week for 30 minutes.   Plan to keep appointment with Dr. Buddy Duty 02/23/16 Plan to return to Link to Wellness on  03/09/16 at [place] Problem One        Most Recent Value   Care Plan Problem One  Potential for elevated blood sugars related to dx of Type 2 DM   Role Documenting the Problem One  Care Management Coordinator   Care Plan for Problem One  Active   THN Long Term Goal (31-90 days)  Member will maintain hemoglobin A1C at or below 7 for the next 90 days   THN Long Term Goal Start Date  11/25/15 [Continue last hemoglobin A1C 6%]   Interventions for Problem One Long Term Goal  Reinforced on CHO counting and portion control,  Reinforced  importance of regular exercise for glycemic control,Encouraged to sign up for the [place] Exercise Lifestyle Transformation program, Discussed joining [employer] with the [facility] discount,  Reinforced benefits of weight loss     Peter Garter RN, [facility] Care Management Coordinator-Link to [place] Management [phone]

## 2015-11-29 DIAGNOSIS — F4321 Adjustment disorder with depressed mood: Secondary | ICD-10-CM | POA: Diagnosis not present

## 2015-11-30 ENCOUNTER — Other Ambulatory Visit: Payer: Self-pay | Admitting: Internal Medicine

## 2015-12-01 MED FILL — VICTOZA 18 MG/3 ML INJECT P: 18 | 90 days supply | Qty: 27 | Fill #0

## 2015-12-02 MED FILL — TRUE METRIX GLUCOSE TEST ST: 50 days supply | Qty: 100 | Fill #0

## 2015-12-08 DIAGNOSIS — F4321 Adjustment disorder with depressed mood: Secondary | ICD-10-CM | POA: Diagnosis not present

## 2015-12-10 MED FILL — predniSONE 5 MG TABS: 5 | 20 days supply | Qty: 42 | Fill #0

## 2015-12-22 DIAGNOSIS — F4321 Adjustment disorder with depressed mood: Secondary | ICD-10-CM | POA: Diagnosis not present

## 2015-12-28 ENCOUNTER — Ambulatory Visit (HOSPITAL_COMMUNITY)
Admission: RE | Admit: 2015-12-28 | Discharge: 2015-12-28 | Disposition: A | Discharge status: Home / Self Care | Payer: 59 | Source: Ambulatory Visit | Attending: Rheumatology | Admitting: Rheumatology

## 2015-12-28 DIAGNOSIS — L4050 Arthropathic psoriasis, unspecified: Secondary | ICD-10-CM | POA: Diagnosis not present

## 2015-12-28 LAB — CBC WITH DIFFERENTIAL/PLATELET
Basophils Absolute: 0 10*3/uL (ref 0.0–0.1)
Basophils Relative: 0 %
Eosinophils Absolute: 0.2 10*3/uL (ref 0.0–0.7)
Eosinophils Relative: 3 %
HCT: 41.1 % (ref 36.0–46.0)
Hemoglobin: 12.7 g/dL (ref 12.0–15.0)
Lymphocytes Relative: 23 %
Lymphs Abs: 1.6 10*3/uL (ref 0.7–4.0)
MCH: 26.4 pg (ref 26.0–34.0)
MCHC: 30.9 g/dL (ref 30.0–36.0)
MCV: 85.4 fL (ref 78.0–100.0)
Monocytes Absolute: 0.7 10*3/uL (ref 0.1–1.0)
Monocytes Relative: 10 %
Neutro Abs: 4.5 10*3/uL (ref 1.7–7.7)
Neutrophils Relative %: 64 %
Platelets: 208 10*3/uL (ref 150–400)
RBC: 4.81 MIL/uL (ref 3.87–5.11)
RDW: 18.6 % — ABNORMAL HIGH (ref 11.5–15.5)
WBC: 7 10*3/uL (ref 4.0–10.5)

## 2015-12-28 LAB — COMPREHENSIVE METABOLIC PANEL
ALT: 70 U/L — ABNORMAL HIGH (ref 14–54)
AST: 39 U/L (ref 15–41)
Albumin: 3.6 g/dL (ref 3.5–5.0)
Alkaline Phosphatase: 65 U/L (ref 38–126)
Anion gap: 10 (ref 5–15)
BUN: 11 mg/dL (ref 6–20)
CO2: 23 mmol/L (ref 22–32)
Calcium: 9.3 mg/dL (ref 8.9–10.3)
Chloride: 104 mmol/L (ref 101–111)
Creatinine, Ser: 0.7 mg/dL (ref 0.44–1.00)
GFR calc Af Amer: >60 mL/min (ref >60)
GFR calc non Af Amer: >60 mL/min (ref >60)
Glucose, Bld: 249 mg/dL — ABNORMAL HIGH (ref 65–99)
Potassium: 4.1 mmol/L (ref 3.5–5.1)
Sodium: 137 mmol/L (ref 135–145)
Total Bilirubin: 0.4 mg/dL (ref 0.3–1.2)
Total Protein: 6.9 g/dL (ref 6.5–8.1)

## 2015-12-28 MED ORDER — ACETAMINOPHEN 325 MG PO TABS: 650.0000 mg | ORAL_TABLET | ORAL | Status: DC

## 2015-12-28 MED ORDER — SODIUM CHLORIDE 0.9 % IV SOLN
6.0000 mg/kg | INTRAVENOUS | Status: AC
  Administered 2015-12-28: 700 mg via INTRAVENOUS
  Filled 2015-12-28: qty 70

## 2015-12-28 MED ORDER — SODIUM CHLORIDE 0.9 % IV SOLN
INTRAVENOUS | Status: DC
  Administered 2015-12-28: 10:00:00 via INTRAVENOUS

## 2015-12-28 MED ORDER — DIPHENHYDRAMINE HCL 25 MG PO CAPS: 25.0000 mg | ORAL_CAPSULE | ORAL | Status: DC

## 2015-12-29 DIAGNOSIS — F4321 Adjustment disorder with depressed mood: Secondary | ICD-10-CM | POA: Diagnosis not present

## 2015-12-31 LAB — QUANTIFERON TB GOLD ASSAY (BLOOD)

## 2015-12-31 LAB — QUANTIFERON IN TUBE
QFT TB AG MINUS NIL VALUE: 0 IU/mL
QUANTIFERON MITOGEN VALUE: >10 IU/mL
QUANTIFERON TB AG VALUE: 0.04 IU/mL
QUANTIFERON TB GOLD: NEGATIVE
Quantiferon Nil Value: 0.04 IU/mL

## 2016-01-03 DIAGNOSIS — F4321 Adjustment disorder with depressed mood: Secondary | ICD-10-CM | POA: Diagnosis not present

## 2016-01-10 MED FILL — METHOTREXATE 25 MG/ML VIAL: 50 | 55 days supply | Qty: 4 | Fill #1

## 2016-01-12 DIAGNOSIS — F4321 Adjustment disorder with depressed mood: Secondary | ICD-10-CM | POA: Diagnosis not present

## 2016-01-14 MED FILL — TRUE METRIX GLUCOSE TEST ST: 50 days supply | Qty: 100 | Fill #1

## 2016-01-19 DIAGNOSIS — F4321 Adjustment disorder with depressed mood: Secondary | ICD-10-CM | POA: Diagnosis not present

## 2016-01-27 DIAGNOSIS — F4321 Adjustment disorder with depressed mood: Secondary | ICD-10-CM | POA: Diagnosis not present

## 2016-02-04 DIAGNOSIS — F4321 Adjustment disorder with depressed mood: Secondary | ICD-10-CM | POA: Diagnosis not present

## 2016-02-04 MED FILL — UNIFINE PENTIPS 32GX5/32: 32G X 4 MM | 90 days supply | Qty: 100 | Fill #0

## 2016-02-07 ENCOUNTER — Other Ambulatory Visit (HOSPITAL_COMMUNITY): Payer: Self-pay | Admitting: *Deleted

## 2016-02-07 DIAGNOSIS — M170 Bilateral primary osteoarthritis of knee: Secondary | ICD-10-CM | POA: Diagnosis not present

## 2016-02-07 DIAGNOSIS — M79641 Pain in right hand: Secondary | ICD-10-CM | POA: Diagnosis not present

## 2016-02-07 DIAGNOSIS — Z09 Encounter for follow-up examination after completed treatment for conditions other than malignant neoplasm: Secondary | ICD-10-CM | POA: Diagnosis not present

## 2016-02-07 DIAGNOSIS — L408 Other psoriasis: Secondary | ICD-10-CM | POA: Diagnosis not present

## 2016-02-07 DIAGNOSIS — Z5181 Encounter for therapeutic drug level monitoring: Secondary | ICD-10-CM | POA: Diagnosis not present

## 2016-02-07 MED FILL — traMADol HCL 50 MG TABS: 50 | 30 days supply | Qty: 90 | Fill #0

## 2016-02-08 ENCOUNTER — Ambulatory Visit (HOSPITAL_COMMUNITY)
Admission: RE | Admit: 2016-02-08 | Discharge: 2016-02-08 | Disposition: A | Discharge status: Home / Self Care | Payer: 59 | Source: Ambulatory Visit | Attending: Rheumatology | Admitting: Rheumatology

## 2016-02-08 DIAGNOSIS — L4050 Arthropathic psoriasis, unspecified: Secondary | ICD-10-CM | POA: Diagnosis not present

## 2016-02-08 MED ORDER — SODIUM CHLORIDE 0.9 % IV SOLN
6.0000 mg/kg | INTRAVENOUS | Status: DC
  Administered 2016-02-08: 700 mg via INTRAVENOUS
  Filled 2016-02-08: qty 70

## 2016-02-08 MED ORDER — ACETAMINOPHEN 325 MG PO TABS: 650.0000 mg | ORAL_TABLET | ORAL | Status: DC

## 2016-02-08 MED ORDER — DIPHENHYDRAMINE HCL 25 MG PO CAPS: 25.0000 mg | ORAL_CAPSULE | ORAL | Status: DC

## 2016-02-08 MED ORDER — SODIUM CHLORIDE 0.9 % IV SOLN
INTRAVENOUS | Status: DC
  Administered 2016-02-08: 10:00:00 via INTRAVENOUS

## 2016-02-10 MED FILL — TRUE METRIX GLUCOSE TEST ST: 50 days supply | Qty: 100 | Fill #2

## 2016-02-14 DIAGNOSIS — L4050 Arthropathic psoriasis, unspecified: Secondary | ICD-10-CM

## 2016-02-16 DIAGNOSIS — F4321 Adjustment disorder with depressed mood: Secondary | ICD-10-CM | POA: Diagnosis not present

## 2016-02-24 MED FILL — INVOKAMET 50-1,000 MG TAB: 50-1000 | 30 days supply | Qty: 60 | Fill #1

## 2016-02-29 MED FILL — VICTOZA 18 MG/3 ML INJECT P: 18 | 30 days supply | Qty: 9 | Fill #1

## 2016-03-03 ENCOUNTER — Other Ambulatory Visit: Payer: Self-pay | Admitting: Obstetrics and Gynecology

## 2016-03-03 DIAGNOSIS — Z1231 Encounter for screening mammogram for malignant neoplasm of breast: Secondary | ICD-10-CM

## 2016-03-06 DIAGNOSIS — F4323 Adjustment disorder with mixed anxiety and depressed mood: Secondary | ICD-10-CM | POA: Diagnosis not present

## 2016-03-07 DIAGNOSIS — Z794 Long term (current) use of insulin: Secondary | ICD-10-CM | POA: Diagnosis not present

## 2016-03-07 DIAGNOSIS — Z6841 Body Mass Index (BMI) 40.0 and over, adult: Secondary | ICD-10-CM | POA: Diagnosis not present

## 2016-03-07 DIAGNOSIS — Z5181 Encounter for therapeutic drug level monitoring: Secondary | ICD-10-CM | POA: Diagnosis not present

## 2016-03-07 DIAGNOSIS — E119 Type 2 diabetes mellitus without complications: Secondary | ICD-10-CM | POA: Diagnosis not present

## 2016-03-07 MED FILL — SYNJARDY 5-1,000 MG TABLET: 5-1000 | 90 days supply | Qty: 180 | Fill #0

## 2016-03-09 ENCOUNTER — Ambulatory Visit: Payer: 59

## 2016-03-10 MED FILL — METHOTREXATE 25 MG/ML VIAL: 50 | 84 days supply | Qty: 6 | Fill #0

## 2016-03-13 DIAGNOSIS — H524 Presbyopia: Secondary | ICD-10-CM | POA: Diagnosis not present

## 2016-03-13 DIAGNOSIS — H52223 Regular astigmatism, bilateral: Secondary | ICD-10-CM | POA: Diagnosis not present

## 2016-03-13 DIAGNOSIS — H5213 Myopia, bilateral: Secondary | ICD-10-CM | POA: Diagnosis not present

## 2016-03-13 LAB — HM DIABETES EYE EXAM

## 2016-03-14 ENCOUNTER — Ambulatory Visit
Admission: RE | Admit: 2016-03-14 | Discharge: 2016-03-14 | Disposition: A | Discharge status: Home / Self Care | Payer: 59 | Source: Ambulatory Visit | Attending: Obstetrics and Gynecology | Admitting: Obstetrics and Gynecology

## 2016-03-14 DIAGNOSIS — Z1231 Encounter for screening mammogram for malignant neoplasm of breast: Secondary | ICD-10-CM

## 2016-03-16 DIAGNOSIS — F4323 Adjustment disorder with mixed anxiety and depressed mood: Secondary | ICD-10-CM | POA: Diagnosis not present

## 2016-03-21 ENCOUNTER — Ambulatory Visit (HOSPITAL_COMMUNITY)
Admission: RE | Admit: 2016-03-21 | Discharge: 2016-03-21 | Disposition: A | Discharge status: Home / Self Care | Payer: 59 | Source: Ambulatory Visit | Attending: Rheumatology | Admitting: Rheumatology

## 2016-03-21 DIAGNOSIS — L4050 Arthropathic psoriasis, unspecified: Secondary | ICD-10-CM | POA: Diagnosis not present

## 2016-03-21 LAB — COMPREHENSIVE METABOLIC PANEL
ALT: 87 U/L — ABNORMAL HIGH (ref 14–54)
AST: 41 U/L (ref 15–41)
Albumin: 3.7 g/dL (ref 3.5–5.0)
Alkaline Phosphatase: 80 U/L (ref 38–126)
Anion gap: 9 (ref 5–15)
BUN: 14 mg/dL (ref 6–20)
CO2: 23 mmol/L (ref 22–32)
Calcium: 9.9 mg/dL (ref 8.9–10.3)
Chloride: 105 mmol/L (ref 101–111)
Creatinine, Ser: 0.72 mg/dL (ref 0.44–1.00)
GFR calc Af Amer: >60 mL/min (ref >60)
GFR calc non Af Amer: >60 mL/min (ref >60)
Glucose, Bld: 190 mg/dL — ABNORMAL HIGH (ref 65–99)
Potassium: 4.1 mmol/L (ref 3.5–5.1)
Sodium: 137 mmol/L (ref 135–145)
Total Bilirubin: 0.5 mg/dL (ref 0.3–1.2)
Total Protein: 6.9 g/dL (ref 6.5–8.1)

## 2016-03-21 LAB — CBC WITH DIFFERENTIAL/PLATELET
Basophils Absolute: 0 10*3/uL (ref 0.0–0.1)
Basophils Relative: 1 %
Eosinophils Absolute: 0.2 10*3/uL (ref 0.0–0.7)
Eosinophils Relative: 3 %
HCT: 43.3 % (ref 36.0–46.0)
Hemoglobin: 13.8 g/dL (ref 12.0–15.0)
Lymphocytes Relative: 24 %
Lymphs Abs: 1.9 10*3/uL (ref 0.7–4.0)
MCH: 28.6 pg (ref 26.0–34.0)
MCHC: 31.9 g/dL (ref 30.0–36.0)
MCV: 89.6 fL (ref 78.0–100.0)
Monocytes Absolute: 0.6 10*3/uL (ref 0.1–1.0)
Monocytes Relative: 7 %
Neutro Abs: 5.4 10*3/uL (ref 1.7–7.7)
Neutrophils Relative %: 65 %
Platelets: 237 10*3/uL (ref 150–400)
RBC: 4.83 MIL/uL (ref 3.87–5.11)
RDW: 15.3 % (ref 11.5–15.5)
WBC: 8.3 10*3/uL (ref 4.0–10.5)

## 2016-03-21 MED ORDER — SODIUM CHLORIDE 0.9 % IV SOLN
6.0000 mg/kg | INTRAVENOUS | Status: AC
  Administered 2016-03-21: 700 mg via INTRAVENOUS
  Filled 2016-03-21: qty 70

## 2016-03-21 MED ORDER — SODIUM CHLORIDE 0.9 % IV SOLN: INTRAVENOUS | Status: DC

## 2016-03-21 MED ORDER — ACETAMINOPHEN 325 MG PO TABS: 650.0000 mg | ORAL_TABLET | ORAL | Status: DC

## 2016-03-21 MED ORDER — DIPHENHYDRAMINE HCL 25 MG PO CAPS: 25.0000 mg | ORAL_CAPSULE | ORAL | Status: DC

## 2016-03-21 MED ORDER — SODIUM CHLORIDE 0.9 % IV SOLN
INTRAVENOUS | Status: DC
  Administered 2016-03-21: 10:00:00 via INTRAVENOUS

## 2016-03-27 DIAGNOSIS — F4323 Adjustment disorder with mixed anxiety and depressed mood: Secondary | ICD-10-CM | POA: Diagnosis not present

## 2016-03-28 ENCOUNTER — Other Ambulatory Visit: Payer: Self-pay | Admitting: Internal Medicine

## 2016-03-28 MED FILL — VICTOZA 18 MG/3 ML INJECT P: 18 | 30 days supply | Qty: 9 | Fill #0

## 2016-04-04 MED FILL — FOLIC ACID 1 MG TABLET: 1 | 90 days supply | Qty: 90 | Fill #1

## 2016-04-05 ENCOUNTER — Telehealth: Payer: 59 | Admitting: Nurse Practitioner

## 2016-04-05 DIAGNOSIS — B001 Herpesviral vesicular dermatitis: Secondary | ICD-10-CM | POA: Diagnosis not present

## 2016-04-05 DIAGNOSIS — F4323 Adjustment disorder with mixed anxiety and depressed mood: Secondary | ICD-10-CM | POA: Diagnosis not present

## 2016-04-05 MED ORDER — VALACYCLOVIR HCL 1 G PO TABS: ORAL_TABLET | ORAL | 2 refills | Status: DC

## 2016-04-05 MED FILL — valACYclovir HCL 1 GM TABS: 1 | 1 days supply | Qty: 4 | Fill #0

## 2016-04-05 NOTE — Progress Notes (Signed)
We are sorry that you are not feeling well.  Here is how we plan to help!  Based on what you have shared with me it does look like you have a viral infection.    Most cold sores or fever blisters are small fluid filled blisters around the mouth caused by herpes simplex virus.  The most common strain of the virus causing cold sores is herpes simplex virus 1.  It can be spread by skin contact, sharing eating utensils, or even sharing towels.  Cold sores are contagious to other people until dry. (Approximately 5-7 days).  Wash your hands. You can spread the virus to your eyes through handling your contact lenses after touching the lesions.  Most people experience pain at the sight or tingling sensations in their lips that may begin before the ulcers erupt.  Herpes simplex is treatable but not curable.  It may lie dormant for a long time and then reappear due to stress or prolonged sun exposure.  Many patients have success in treating their cold sores with an over the counter topical called Abreva.  You may apply the cream up to 5 times daily (maximum 10 days) until healing occurs.  If you would like to use an oral antiviral medication to speed the healing of your cold sore, I have sent a prescription to your local pharmacy Valacyclovir 2 gm twice daily for 1 day    HOME CARE:   Wash your hands frequently.  Do not pick at or rub the sore.  Don't open the blisters.  Avoid kissing other people during this time.  Avoid sharing drinking glasses, eating utensils, or razors.  Do not handle contact lenses unless you have thoroughly washed your hands with soap and warm water!  Avoid oral sex during this time.  Herpes from sores on your mouth can spread to your partner's genital area.  Avoid contact with anyone who has eczema or a weakened immune system.  Cold sores are often triggered by exposure to intense sunlight, use a lip balm containing a sunscreen (SPF 30 or higher).  GET HELP RIGHT AWAY  IF:   Blisters look infected.  Blisters occur near or in the eye.  Symptoms last longer than 10 days.  Your symptoms become worse.  MAKE SURE YOU:   Understand these instructions.  Will watch your condition.  Will get help right away if you are not doing well or get worse.    Your e-visit answers were reviewed by a board certified advanced clinical practitioner to complete your personal care plan.  Depending upon the condition, your plan could have  Included both over the counter or prescription medications.    Please review your pharmacy choice.  Be sure that the pharmacy you have chosen is open so that you can pick up your prescription now.  If there is a problem you csn message your provider in MyChart to have the prescription routed to another pharmacy.    Your safety is important to us.  If you have drug allergies check our prescription carefully.  For the next 24 hours you can use MyChart to ask questions about today's visit, request a non-urgent call back, or ask for a work or school excuse from your e-visit provider.  You will get an email in the next two days asking about your experience.  I hope that your e-visit has been valuable and will speed your recovery.  

## 2016-04-09 ENCOUNTER — Other Ambulatory Visit: Payer: Self-pay | Admitting: Radiology

## 2016-04-09 DIAGNOSIS — L4050 Arthropathic psoriasis, unspecified: Secondary | ICD-10-CM

## 2016-04-10 ENCOUNTER — Other Ambulatory Visit: Payer: Self-pay

## 2016-04-10 VITALS — BP 128/72 | HR 78 | Resp 14 | Ht 63.0 in | Wt 258.2 lb

## 2016-04-10 DIAGNOSIS — E119 Type 2 diabetes mellitus without complications: Secondary | ICD-10-CM

## 2016-04-10 NOTE — Patient Instructions (Signed)
1. Plan to check blood sugar daily either fasting in AM or 1 1/2 -2 hrs after eating and record.  Goals are 80-130 fasting and less than 180 after eating. 2. Try to eat 30-45 gms of carbohydrate per meal and 15 gm with snack.  Plan to eat more protein in diet and carry snacks.  Eat breakfast regularly. 3. Plan to walk 3 times a week for 20 minutes and do resistance training at least 2 times a week  4. Plan to keep appointment with Dr. Buddy Duty in April 5. Plan to enroll in [facility] this week 6. Plan to return to Link to Wellness on  07/13/16 at [facility] or cancel when in [place] program

## 2016-04-10 NOTE — Patient Outreach (Signed)
[facility]) Care Management   04/10/2016  Tabitha [name] 09-16-1965 [id number]  Tabitha [name] is an 50 y.o. female.   Member seen for follow up office visit for Link to Wellness program for self management of Type 2 diabetes  Subjective: Member states that she has been doing good but stressed with going to graduate school.  States that she has noticed that her fasting blood sugars have been higher and her after meal readings are better.  States that Dr.Kerr changed her Invokamet to [place].  States that her hemoglobin A1C was 6.3% in September.  States that she had been on about month of steroids in July in which her blood sugars were higher.  States she has been trying to walk on the treadmill 2-3 times a week at the gym at [facility]   Objective:   Review of Systems  Musculoskeletal: Positive for joint pain.    Physical Exam Today's Vitals   04/10/16 0917 04/10/16 0923  BP: 128/72   Pulse: 78   Resp: 14   SpO2: 94%   Weight: 258 lb 3.2 oz (117.1 kg)   Height: 1.6 m (5\' 3" )   PainSc: 0-No pain 0-No pain   Encounter Medications:   Outpatient Encounter Prescriptions as of 04/10/2016  Medication Sig Note  . albuterol (PROVENTIL HFA;VENTOLIN HFA) 108 (90 BASE) MCG/ACT inhaler Inhale 2 puffs into the lungs every 6 (six) hours as needed for wheezing or shortness of breath.   Marland Kitchen aspirin EC 81 MG tablet Take 81 mg by mouth daily.   . clobetasol cream (TEMOVATE) [id number] % Apply 1 application topically 2 (two) times daily.   . Diclofenac Sodium (PENNSAID) 2 % SOLN Place 2 application onto the skin 2 (two) times daily.   . Empagliflozin-Metformin HCl (SYNJARDY) 10-998 MG TABS Take 1 tablet by mouth 2 (two) times daily.   . folic acid (FOLVITE) 1 MG tablet Take 1 tablet (1 mg total) by mouth 2 (two) times daily.   . Methotrexate Sodium, PF, 50 MG/2ML SOLN Inject 10 mg as directed once a week. On Friday 03/12/2012: Patient reports decreased blood counts with 20 mg of methotrexate weekly.  Tolerating 15 mg dose.  Marland Kitchen MILK THISTLE-DAND-FENNEL-LICOR PO Take 1 capsule by mouth daily.   . Omega-3 Fatty Acids (FISH OIL) 1200 MG CAPS Take 1-2 capsules (1,200-2,400 mg total) by mouth 2 (two) times daily. Takes 2400 mg qam and 1200 mg qpm   . TraMADol HCl 50 MG TBDP Take 1 tablet by mouth every 8 (eight) hours as needed. 03/12/2012: Occasional use  . TURMERIC PO Take by mouth 2 (two) times daily.    Marland Kitchen VICTOZA 18 MG/3ML SOPN INJECT 1.8 MG INTO THE SKIN AS DIRECTED DAILY.   . valACYclovir (VALTREX) 1000 MG tablet 2 po bid x1 day (Patient not taking: Reported on 04/10/2016)   . [DISCONTINUED] Canagliflozin-Metformin HCl 50-1000 MG TABS Take 1 tablet by mouth 2 (two) times daily.    No facility-administered encounter medications on file as of 04/10/2016.     Functional Status:   In your present state of health, do you have any difficulty performing the following activities: 04/10/2016 12/28/2015  Hearing? N N  Vision? N N  Difficulty concentrating or making decisions? N N  Walking or climbing stairs? N N  Dressing or bathing? N N  Doing errands, shopping? N -  Some recent data might be hidden    Fall/Depression Screening:    PHQ 2/9 Scores 04/10/2016 11/25/2015 08/05/2015 04/13/2015 12/15/2014  PHQ - 2 Score 0 0 0 0 1    Assessment:   Member seen for follow up office visit for Link to Wellness program for self management of Type 2 diabetes. Member is meeting diabetes goal of hemoglobin A1C less than 7% with last reading 6.3% Member is to see Dr.Kerr on 09/12/16. Member reports following low CHO diet and has increased activity. Reports tolerating medications without adverse effects.  Member had been having more stress with graduate school and dealing with divorce.    Plan:   Plan to check blood sugar daily either fasting in AM or 1 1/2 -2 hrs after eating and record.  Goals are 80-130 fasting and less than 180 after eating. Try to eat 30-45 gms of carbohydrate per meal and 15 gm with  snack.  Plan to eat more protein in diet and carry snacks.  Eat breakfast regularly. Plan to walk 3 times a week for 20 minutes and do resistance training at least 2 times a week  Plan to keep appointment with Dr. Buddy Duty in April Plan to enroll in [facility] this week Plan to return to [place] to Wellness on  07/13/16 at [facility] or cancel when in [place] program  [place] Problem One   Flowsheet Row Most Recent Value  Care Plan Problem One  Potential for elevated blood sugars related to dx of Type 2 DM  Role Documenting the Problem One  Care Management [place] for Problem One  Active  THN Long Term Goal (31-90 days)  Member will maintain hemoglobin A1C at or below 7 for the next 90 days  THN Long Term Goal Start Date  04/10/16 [Continue last hemoglobin A1C 6.3%]  Interventions for Problem One Long Term Goal  Reinforced on CHO counting and portion control, Discussed diabetes medications and how they effect her blood sugars fasing and postprandial,  Reinforced  importance of regular exercise for glycemic control,  Reinforced benefits of weight loss, Given handout on [place] program and instructed to enroll in the program     Peter Garter RN, [facility] Care Management Coordinator-Link to [place] Management [phone]

## 2016-04-13 MED FILL — TRUE METRIX GLUCOSE TEST ST: 50 days supply | Qty: 100 | Fill #0

## 2016-04-20 DIAGNOSIS — F4323 Adjustment disorder with mixed anxiety and depressed mood: Secondary | ICD-10-CM | POA: Diagnosis not present

## 2016-04-25 MED FILL — UNIFINE PENTIPS 32GX5/32": 32G X 4 MM | 90 days supply | Qty: 100 | Fill #1

## 2016-04-26 MED FILL — UNIFINE PENTIPS 32GX5/32: 32G X 4 MM | 90 days supply | Qty: 100 | Fill #1

## 2016-04-28 DIAGNOSIS — H04123 Dry eye syndrome of bilateral lacrimal glands: Secondary | ICD-10-CM | POA: Diagnosis not present

## 2016-04-28 DIAGNOSIS — H40013 Open angle with borderline findings, low risk, bilateral: Secondary | ICD-10-CM | POA: Diagnosis not present

## 2016-05-02 ENCOUNTER — Encounter (HOSPITAL_COMMUNITY): Payer: 59

## 2016-05-02 MED FILL — VICTOZA 18 MG/3 ML INJECT P: 18 | 30 days supply | Qty: 9 | Fill #1

## 2016-05-03 ENCOUNTER — Ambulatory Visit (HOSPITAL_COMMUNITY)
Admission: RE | Admit: 2016-05-03 | Discharge: 2016-05-03 | Disposition: A | Discharge status: Home / Self Care | Payer: 59 | Source: Ambulatory Visit | Attending: Rheumatology | Admitting: Rheumatology

## 2016-05-03 DIAGNOSIS — L4050 Arthropathic psoriasis, unspecified: Secondary | ICD-10-CM | POA: Insufficient documentation

## 2016-05-03 DIAGNOSIS — F4323 Adjustment disorder with mixed anxiety and depressed mood: Secondary | ICD-10-CM | POA: Diagnosis not present

## 2016-05-03 MED ORDER — ACETAMINOPHEN 325 MG PO TABS: 650.0000 mg | ORAL_TABLET | Freq: Once | ORAL | Status: DC

## 2016-05-03 MED ORDER — DIPHENHYDRAMINE HCL 25 MG PO CAPS: 25.0000 mg | ORAL_CAPSULE | Freq: Once | ORAL | Status: DC

## 2016-05-03 MED ORDER — SODIUM CHLORIDE 0.9 % IV SOLN
6.0000 mg/kg | INTRAVENOUS | Status: DC
  Administered 2016-05-03: 700 mg via INTRAVENOUS
  Filled 2016-05-03: qty 70

## 2016-05-16 DIAGNOSIS — F4323 Adjustment disorder with mixed anxiety and depressed mood: Secondary | ICD-10-CM | POA: Diagnosis not present

## 2016-05-22 DIAGNOSIS — L409 Psoriasis, unspecified: Secondary | ICD-10-CM | POA: Diagnosis not present

## 2016-05-22 DIAGNOSIS — D229 Melanocytic nevi, unspecified: Secondary | ICD-10-CM | POA: Diagnosis not present

## 2016-05-29 DIAGNOSIS — Z1151 Encounter for screening for human papillomavirus (HPV): Secondary | ICD-10-CM | POA: Diagnosis not present

## 2016-05-29 DIAGNOSIS — Z1329 Encounter for screening for other suspected endocrine disorder: Secondary | ICD-10-CM | POA: Diagnosis not present

## 2016-05-29 DIAGNOSIS — Z6841 Body Mass Index (BMI) 40.0 and over, adult: Secondary | ICD-10-CM | POA: Diagnosis not present

## 2016-05-29 DIAGNOSIS — Z01419 Encounter for gynecological examination (general) (routine) without abnormal findings: Secondary | ICD-10-CM | POA: Diagnosis not present

## 2016-05-29 DIAGNOSIS — Z113 Encounter for screening for infections with a predominantly sexual mode of transmission: Secondary | ICD-10-CM | POA: Diagnosis not present

## 2016-06-01 MED FILL — valACYclovir HCL 1 GM TABS: 1 | 1 days supply | Qty: 4 | Fill #1

## 2016-06-01 MED FILL — VICTOZA 18 MG/3 ML INJECT P: 18 | 30 days supply | Qty: 9 | Fill #2

## 2016-06-02 ENCOUNTER — Telehealth: Payer: 59 | Admitting: Family

## 2016-06-02 DIAGNOSIS — J209 Acute bronchitis, unspecified: Secondary | ICD-10-CM

## 2016-06-02 MED ORDER — BENZONATATE 100 MG PO CAPS: 100.0000 mg | ORAL_CAPSULE | Freq: Two times a day (BID) | ORAL | 0 refills | Status: DC | PRN

## 2016-06-02 MED ORDER — AZITHROMYCIN 250 MG PO TABS: ORAL_TABLET | ORAL | 0 refills | Status: DC

## 2016-06-02 MED FILL — AZITHROMYCIN 250 MG TABLET: 250 | 5 days supply | Qty: 6 | Fill #0

## 2016-06-02 MED FILL — BENZONATATE 100 MG CAPSULE: 100 | 10 days supply | Qty: 20 | Fill #0

## 2016-06-02 NOTE — Progress Notes (Signed)

## 2016-06-14 ENCOUNTER — Ambulatory Visit (HOSPITAL_COMMUNITY)
Admission: RE | Admit: 2016-06-14 | Discharge: 2016-06-14 | Disposition: A | Discharge status: Home / Self Care | Payer: 59 | Source: Ambulatory Visit | Attending: Rheumatology | Admitting: Rheumatology

## 2016-06-14 DIAGNOSIS — L4050 Arthropathic psoriasis, unspecified: Secondary | ICD-10-CM | POA: Insufficient documentation

## 2016-06-14 LAB — CBC WITH DIFFERENTIAL/PLATELET
Basophils Absolute: 0 10*3/uL (ref 0.0–0.1)
Basophils Relative: 0 %
Eosinophils Absolute: 0.2 10*3/uL (ref 0.0–0.7)
Eosinophils Relative: 3 %
HCT: 44.7 % (ref 36.0–46.0)
Hemoglobin: 14.7 g/dL (ref 12.0–15.0)
Lymphocytes Relative: 26 %
Lymphs Abs: 2 10*3/uL (ref 0.7–4.0)
MCH: 29 pg (ref 26.0–34.0)
MCHC: 32.9 g/dL (ref 30.0–36.0)
MCV: 88.2 fL (ref 78.0–100.0)
Monocytes Absolute: 0.5 10*3/uL (ref 0.1–1.0)
Monocytes Relative: 7 %
Neutro Abs: 4.9 10*3/uL (ref 1.7–7.7)
Neutrophils Relative %: 64 %
Platelets: 233 10*3/uL (ref 150–400)
RBC: 5.07 MIL/uL (ref 3.87–5.11)
RDW: 14.2 % (ref 11.5–15.5)
WBC: 7.6 10*3/uL (ref 4.0–10.5)

## 2016-06-14 MED ORDER — DIPHENHYDRAMINE HCL 25 MG PO CAPS: 25.0000 mg | ORAL_CAPSULE | Freq: Once | ORAL | Status: DC

## 2016-06-14 MED ORDER — SODIUM CHLORIDE 0.9 % IV SOLN
6.0000 mg/kg | INTRAVENOUS | Status: AC
  Administered 2016-06-14: 700 mg via INTRAVENOUS
  Filled 2016-06-14: qty 70

## 2016-06-14 MED ORDER — ACETAMINOPHEN 325 MG PO TABS: 650.0000 mg | ORAL_TABLET | Freq: Once | ORAL | Status: DC

## 2016-06-14 NOTE — Progress Notes (Signed)
Labs normal.

## 2016-06-20 ENCOUNTER — Telehealth: Payer: Self-pay | Admitting: Pharmacist

## 2016-06-21 NOTE — Telephone Encounter (Signed)
Entered in error

## 2016-06-26 ENCOUNTER — Telehealth: Payer: Self-pay | Admitting: Rheumatology

## 2016-06-26 DIAGNOSIS — L4050 Arthropathic psoriasis, unspecified: Secondary | ICD-10-CM

## 2016-06-26 MED FILL — SYNJARDY 5-1,000 MG TABLET: 5-1000 | 90 days supply | Qty: 180 | Fill #1

## 2016-06-26 NOTE — Telephone Encounter (Signed)
Patient is requesting refill of folic acid to be sent to [facility] outpatient pharmacy. Patient states the rx the pharmacy has is incorrect, it's only for once a day and patient is under the impression that she is supposed to take 2 a day.

## 2016-06-27 DIAGNOSIS — F4323 Adjustment disorder with mixed anxiety and depressed mood: Secondary | ICD-10-CM | POA: Diagnosis not present

## 2016-06-29 MED FILL — FOLIC ACID 1 MG TABLET: 1 | 90 days supply | Qty: 90 | Fill #2

## 2016-06-30 MED ORDER — FOLIC ACID 1 MG PO TABS: 1.0000 mg | ORAL_TABLET | Freq: Two times a day (BID) | ORAL | 4 refills | Status: DC

## 2016-06-30 NOTE — Telephone Encounter (Signed)
Ok to refill per Dr Estanislado Pandy 2mg / day

## 2016-07-07 ENCOUNTER — Other Ambulatory Visit: Payer: Self-pay | Admitting: Radiology

## 2016-07-07 DIAGNOSIS — L4050 Arthropathic psoriasis, unspecified: Secondary | ICD-10-CM

## 2016-07-07 MED FILL — VICTOZA 18 MG/3 ML INJECT P: 18 | 30 days supply | Qty: 9 | Fill #3

## 2016-07-10 DIAGNOSIS — F4323 Adjustment disorder with mixed anxiety and depressed mood: Secondary | ICD-10-CM | POA: Diagnosis not present

## 2016-07-13 ENCOUNTER — Ambulatory Visit: Payer: Self-pay

## 2016-07-24 DIAGNOSIS — F4323 Adjustment disorder with mixed anxiety and depressed mood: Secondary | ICD-10-CM | POA: Diagnosis not present

## 2016-07-24 NOTE — Progress Notes (Signed)
Office Visit Note  Patient: Tabitha [name]             Date of Birth: 1965-11-25           MRN: [id number]             PCP: Hoyt Koch, MD Referring: Hoyt Koch, * Visit Date: 07/28/2016 Occupation: '@GUAROCC'$ @    Subjective:  Follow-up Follow-up on psoriasis and psoriatic arthritis  History of Present Illness: Tabitha [name] is a 51 y.o. female   Patient is doing well with her psoriatic arthritis and psoriasis. She's not only tolerating the medication well, she feels that the medication is working adequately for her needs.  She is getting Remicade infusions every 6 weeks at 6 mg/kg. She just received her infusion last week and is doing really well currently. She is on methotrexate 0.5 mL's weekly. She's also on folic acid 2 mg daily.  No complaint except she feels fatigued at times. I'll order vitamin D for her to be sure she is not having vitamin D deficiency responsible for the fatigue.  This will be ordered with her next infusion labs.  She has history of discussed supplementation to her left knee by another doctor. She states it is starting to hurt once again. I advised her that we do offer Visco supplementation in the office and if she wants the other doctor to do it she should follow through with that or she can have our clinic take over. She will decide and let us know one where the other.  Patient also is working hard towards weight loss.  She is requesting a refill on tramadol, folic acid, methotrexate injectable.    Activities of Daily Living:  Patient reports morning stiffness for 15 minute.   Patient Denies nocturnal pain.  Difficulty dressing/grooming: Denies Difficulty climbing stairs: Denies Difficulty getting out of chair: Denies Difficulty using hands for taps, buttons, cutlery, and/or writing: Denies   No Rheumatology ROS completed.   PMFS History:  Patient Active Problem List   Diagnosis Date Noted  . Patellofemoral arthritis of  left knee 06/22/2015  . Acute upper respiratory infection 02/24/2015  . Wheezing 02/24/2015  . Bilateral shoulder bursitis 12/29/2014  . SI (sacroiliac) joint dysfunction 10/04/2013  . Nonallopathic lesion of sacral region 10/04/2013  . Nonallopathic lesion of thoracic region 10/04/2013  . Abnormal mammogram 07/04/2012  . Obesity 05/28/2012  . Psoriatic arthritis ([place]) 03/12/2012  . Diabetes mellitus due to underlying condition with hyperglycemia ([place]) 10/03/2011  . Knee pain, bilateral 10/03/2011  . Pes planus 10/03/2011  . Mechanical low back pain 09/06/2011  . Marriage separation 06/27/2011  . Psoriasis 03/30/2011  . Tendonitis of shoulder, right 12/16/2010  . Elevated liver enzymes 07/29/2010  . DE QUERVAIN'S TENOSYNOVITIS, LEFT WRIST 03/15/2010  . HYPERCHOLESTEROLEMIA 05/25/2009  . NONALLOPATHIC LESION OF RIB CAGE NEC 05/25/2009  . POLYCYSTIC OVARIES 04/07/2009  . Nonallopathic lesion of lumbar region 04/07/2009    Past Medical History:  Diagnosis Date  . Arthritis   . De Quervain's disease (tenosynovitis)   . Dermatophytosis, scalp   . Diabetes mellitus   . Hypercholesteremia   . Obesity   . Polycystic ovarian disease   . Psoriatic arthritis ([place])   . Wears glasses     Family History  Problem Relation Age of Onset  . Diabetes Father   . Hypertension Father   . Stroke Father   . Kidney disease Father   . Cancer Mother 35    breast  ca died at age 76  . Hypertension    . Diabetes type II    . Obesity    . Obesity Brother   . Hypertension Brother   . Heart disease Maternal Grandmother   . Alzheimer's disease Maternal Grandmother   . Diabetes Paternal Grandmother   . Heart disease Paternal Grandfather    Past Surgical History:  Procedure Laterality Date  . COLONOSCOPY    . DE QUERVAIN'S RELEASE  2012   left  . KNEE ARTHROSCOPY Right 05/06/2013   Procedure: RIGHT KNEE ARTHROSCOPY ;  Surgeon: Velna Ochs, MD;  Location: [facility];   Service: Orthopedics;  Laterality: Right;  partial lateral minisectomy and chondroplasty  . WISDOM TOOTH EXTRACTION    . WRIST SURGERY  2001   carpel tunnel -rt   Social History   Social History Narrative  . No narrative on file     Objective: Vital Signs: BP 131/79 (BP Location: Left Arm, Patient Position: Sitting)   Pulse 81   Resp 13   Ht 5\' 3"  (1.6 m)   Wt 260 lb (117.9 kg)   BMI 46.06 kg/m    Physical Exam   Musculoskeletal Exam:  Full range of motion of all joints Grip strength is equal and strong bilaterally Fiber myalgia tender points are all absent  CDAI Exam: No CDAI exam completed.  No synovitis on examination  Investigation: Findings:  02/07/2016 UDS and narcotic agreement updated   Hospital Outpatient Visit on 07/26/2016  Component Date Value Ref Range Status  . WBC 07/26/2016 8.3  4.0 - 10.5 K/uL Final  . RBC 07/26/2016 4.76  3.87 - 5.11 MIL/uL Final  . Hemoglobin 07/26/2016 14.0  12.0 - 15.0 g/dL Final  . HCT 07/28/2016 42.5  36.0 - 46.0 % Final  . MCV 07/26/2016 89.3  78.0 - 100.0 fL Final  . MCH 07/26/2016 29.4  26.0 - 34.0 pg Final  . MCHC 07/26/2016 32.9  30.0 - 36.0 g/dL Final  . RDW 07/28/2016 14.3  11.5 - 15.5 % Final  . Platelets 07/26/2016 223  150 - 400 K/uL Final  . Neutrophils Relative % 07/26/2016 63  % Final  . Neutro Abs 07/26/2016 5.3  1.7 - 7.7 K/uL Final  . Lymphocytes Relative 07/26/2016 27  % Final  . Lymphs Abs 07/26/2016 2.2  0.7 - 4.0 K/uL Final  . Monocytes Relative 07/26/2016 7  % Final  . Monocytes Absolute 07/26/2016 0.6  0.1 - 1.0 K/uL Final  . Eosinophils Relative 07/26/2016 3  % Final  . Eosinophils Absolute 07/26/2016 0.2  0.0 - 0.7 K/uL Final  . Basophils Relative 07/26/2016 0  % Final  . Basophils Absolute 07/26/2016 0.0  0.0 - 0.1 K/uL Final  . Sodium 07/26/2016 139  135 - 145 mmol/L Final  . Potassium 07/26/2016 4.3  3.5 - 5.1 mmol/L Final  . Chloride 07/26/2016 106  101 - 111 mmol/L Final  . CO2 07/26/2016  24  22 - 32 mmol/L Final  . Glucose, Bld 07/26/2016 187* 65 - 99 mg/dL Final  . BUN 07/28/2016 14  6 - 20 mg/dL Final  . Creatinine, Ser 07/26/2016 0.70  0.44 - 1.00 mg/dL Final  . Calcium 07/28/2016 9.4  8.9 - 10.3 mg/dL Final  . Total Protein 07/26/2016 6.5  6.5 - 8.1 g/dL Final  . Albumin 07/28/2016 3.6  3.5 - 5.0 g/dL Final  . AST 40/33/5331 40  15 - 41 U/L Final  . ALT 07/26/2016 68* 14 -  54 U/L Final  . Alkaline Phosphatase 07/26/2016 75  38 - 126 U/L Final  . Total Bilirubin 07/26/2016 0.4  0.3 - 1.2 mg/dL Final  . GFR calc non Af Amer 07/26/2016 >60  >60 mL/min Final  . GFR calc Af Amer 07/26/2016 >60  >60 mL/min Final   Comment: (NOTE) The eGFR has been calculated using the CKD EPI equation. This calculation has not been validated in all clinical situations. eGFR's persistently <60 mL/min signify possible Chronic Kidney Disease.   Georgiann Hahn gap 07/26/2016 9  5 - 15 Final  Hospital Outpatient Visit on 06/14/2016  Component Date Value Ref Range Status  . WBC 06/14/2016 7.6  4.0 - 10.5 K/uL Final  . RBC 06/14/2016 5.07  3.87 - 5.11 MIL/uL Final  . Hemoglobin 06/14/2016 14.7  12.0 - 15.0 g/dL Final  . HCT 06/14/2016 44.7  36.0 - 46.0 % Final  . MCV 06/14/2016 88.2  78.0 - 100.0 fL Final  . MCH 06/14/2016 29.0  26.0 - 34.0 pg Final  . MCHC 06/14/2016 32.9  30.0 - 36.0 g/dL Final  . RDW 06/14/2016 14.2  11.5 - 15.5 % Final  . Platelets 06/14/2016 233  150 - 400 K/uL Final  . Neutrophils Relative % 06/14/2016 64  % Final  . Neutro Abs 06/14/2016 4.9  1.7 - 7.7 K/uL Final  . Lymphocytes Relative 06/14/2016 26  % Final  . Lymphs Abs 06/14/2016 2.0  0.7 - 4.0 K/uL Final  . Monocytes Relative 06/14/2016 7  % Final  . Monocytes Absolute 06/14/2016 0.5  0.1 - 1.0 K/uL Final  . Eosinophils Relative 06/14/2016 3  % Final  . Eosinophils Absolute 06/14/2016 0.2  0.0 - 0.7 K/uL Final  . Basophils Relative 06/14/2016 0  % Final  . Basophils Absolute 06/14/2016 0.0  0.0 - 0.1 K/uL Final    Hospital Outpatient Visit on 03/21/2016  Component Date Value Ref Range Status  . WBC 03/21/2016 8.3  4.0 - 10.5 K/uL Final  . RBC 03/21/2016 4.83  3.87 - 5.11 MIL/uL Final  . Hemoglobin 03/21/2016 13.8  12.0 - 15.0 g/dL Final  . HCT 03/21/2016 43.3  36.0 - 46.0 % Final  . MCV 03/21/2016 89.6  78.0 - 100.0 fL Final  . MCH 03/21/2016 28.6  26.0 - 34.0 pg Final  . MCHC 03/21/2016 31.9  30.0 - 36.0 g/dL Final  . RDW 03/21/2016 15.3  11.5 - 15.5 % Final  . Platelets 03/21/2016 237  150 - 400 K/uL Final  . Neutrophils Relative % 03/21/2016 65  % Final  . Neutro Abs 03/21/2016 5.4  1.7 - 7.7 K/uL Final  . Lymphocytes Relative 03/21/2016 24  % Final  . Lymphs Abs 03/21/2016 1.9  0.7 - 4.0 K/uL Final  . Monocytes Relative 03/21/2016 7  % Final  . Monocytes Absolute 03/21/2016 0.6  0.1 - 1.0 K/uL Final  . Eosinophils Relative 03/21/2016 3  % Final  . Eosinophils Absolute 03/21/2016 0.2  0.0 - 0.7 K/uL Final  . Basophils Relative 03/21/2016 1  % Final  . Basophils Absolute 03/21/2016 0.0  0.0 - 0.1 K/uL Final  . Sodium 03/21/2016 137  135 - 145 mmol/L Final  . Potassium 03/21/2016 4.1  3.5 - 5.1 mmol/L Final  . Chloride 03/21/2016 105  101 - 111 mmol/L Final  . CO2 03/21/2016 23  22 - 32 mmol/L Final  . Glucose, Bld 03/21/2016 190* 65 - 99 mg/dL Final  . BUN 03/21/2016 14  6 - 20 mg/dL  Final  . Creatinine, Ser 03/21/2016 0.72  0.44 - 1.00 mg/dL Final  . Calcium 03/21/2016 9.9  8.9 - 10.3 mg/dL Final  . Total Protein 03/21/2016 6.9  6.5 - 8.1 g/dL Final  . Albumin 03/21/2016 3.7  3.5 - 5.0 g/dL Final  . AST 03/21/2016 41  15 - 41 U/L Final  . ALT 03/21/2016 87* 14 - 54 U/L Final  . Alkaline Phosphatase 03/21/2016 80  38 - 126 U/L Final  . Total Bilirubin 03/21/2016 0.5  0.3 - 1.2 mg/dL Final  . GFR calc non Af Amer 03/21/2016 >60  >60 mL/min Final  . GFR calc Af Amer 03/21/2016 >60  >60 mL/min Final   Comment: (NOTE) The eGFR has been calculated using the CKD EPI equation. This  calculation has not been validated in all clinical situations. eGFR's persistently <60 mL/min signify possible Chronic Kidney Disease.   . Anion gap 03/21/2016 9  5 - 15 Final  Scanned Document on 03/13/2016  Component Date Value Ref Range Status  . HM Diabetic Eye Exam 03/13/2016 No Retinopathy  No Retinopathy Corrected     Imaging: No results found.  Speciality Comments: No specialty comments available.    Procedures:  No procedures performed Allergies: Chocolate; Food; Statins; and Latex   Assessment / Plan:     Visit Diagnoses: Psoriatic arthritis ([place]) - Plan: folic acid (FOLVITE) 1 MG tablet  Psoriasis  SI (sacroiliac) joint dysfunction  Tendonitis of shoulder, right  Elevated liver enzymes  Chronic pain of both knees  DE QUERVAIN'S TENOSYNOVITIS, LEFT WRIST  Patellofemoral arthritis of left knee  Bilateral shoulder bursitis  History of diabetes mellitus  High risk medication use - Remicade / Methotrexate  Chronic fatigue   Plan: #1: Psoriatic arthritis and psoriasis. Overall doing very well. Taking her medications as prescribed. On exam, she has no joint swelling. On occasion she does have some minor joint pain but it is consistent with her osteoarthritis or from old damage.  #2: High-risk prescription On Remicade 6 mg per week every 6 weeks. Just recently got infusion this past week On methotrexate 0.5 mL's per week On folic acid 2 mg daily. Adequate response  #3: OA of hands, OA of feet, OA of knee joints. Ongoing discomfort. Patient has had Visco supplementation into her left knee about a year or so ago by another doctor. She responded well to the medication. She starting to have some early osteoarthritis symptoms. I advised her that continuing Visco supplementation on a regular schedule will be very helpful for her. She is agreeable. She'll consider shifting the care to our office for future injections  #4: History of obesity. Patient  is working on weight loss. We discussed strategies for that.  #5: History of osteoarthritis and we discussed joint spurring exercises.  #6: Return to clinic in 5 months.  #7: I will refill tramadol but we've decreased the dose from 1 pill 3 times a day to one pill 2 times a day. Patient is agreeable. Patient is aware that there may be a cost to doing the urine drug screen if her insurance company does not pay for it.  Refill folic acid 2 mg daily ninety-day supply with 4 refills  Refill methotrexate 0.5 mL's per week dispense sufficient quantity for ninety-day supply Medicine needs to have preservative in their  We discussed using Rasuvo 10 mg weekly instead of the injectable methotrexate since we have 2 sample boxes that are available to share with patient. 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 She was taught how to self inject with the Rasuvo0.4 ML's this week Then she'll do her 0.5 mL's of injectable methotrexate next week. And then she'll go back to Rasuvo 0.4 ML's. And then  she'll continue back her methotrexate injectable 0.5 ML's.  #8: Patient is also having fair amount of fatigue. I encouraged her to continue her exercise regimen. In the meanwhile we want to make sure vitamin D levels are adequate because that can be a source of fatigue at times. I'll order vitamin D to be done at her next infusion lab.  Orders: No orders of the defined types were placed in this encounter.  Meds ordered this encounter  Medications  . folic acid (FOLVITE) 1 MG tablet    Sig: Take 1 tablet (1 mg total) by mouth 2 (two) times daily.    Dispense:  180 tablet    Refill:  4    Order Specific Question:   Supervising Provider    Answer:   Bo Merino [[id number]]  . traMADol (ULTRAM) 50 MG tablet    Sig: Take 1 tablet (50 mg total) by mouth 2 (two) times daily.    Dispense:  60 tablet    Refill:  2    Order Specific Question:   Supervising Provider    Answer:   Bo Merino [[id number]]  . Methotrexate, PF, 10 MG/0.2ML SOAJ    Sig: Inject 10 mg into the skin once a week.    Dispense:  12 pen    Refill:  0    Order Specific Question:   Supervising Provider    Answer:   Bo Merino [phone]    Face-to-face time spent with patient was 30 minutes. 50% of time was spent in counseling and coordination of care.  Follow-Up Instructions: Return in about 5 months (around 12/25/2016) for OSTEOPROSIS.   Eliezer Lofts, PA-C  Note - This record has been created using [employer].  Chart creation errors have been sought, but may not always  have been located. Such creation errors do not reflect on  the standard of medical care.

## 2016-07-25 ENCOUNTER — Other Ambulatory Visit (HOSPITAL_COMMUNITY): Payer: Self-pay | Admitting: *Deleted

## 2016-07-26 ENCOUNTER — Ambulatory Visit (HOSPITAL_COMMUNITY)
Admission: RE | Admit: 2016-07-26 | Discharge: 2016-07-26 | Disposition: A | Discharge status: Home / Self Care | Payer: 59 | Source: Ambulatory Visit | Attending: Rheumatology | Admitting: Rheumatology

## 2016-07-26 DIAGNOSIS — L4050 Arthropathic psoriasis, unspecified: Secondary | ICD-10-CM | POA: Insufficient documentation

## 2016-07-26 LAB — COMPREHENSIVE METABOLIC PANEL
ALT: 68 U/L — ABNORMAL HIGH (ref 14–54)
AST: 40 U/L (ref 15–41)
Albumin: 3.6 g/dL (ref 3.5–5.0)
Alkaline Phosphatase: 75 U/L (ref 38–126)
Anion gap: 9 (ref 5–15)
BUN: 14 mg/dL (ref 6–20)
CO2: 24 mmol/L (ref 22–32)
Calcium: 9.4 mg/dL (ref 8.9–10.3)
Chloride: 106 mmol/L (ref 101–111)
Creatinine, Ser: 0.7 mg/dL (ref 0.44–1.00)
GFR calc Af Amer: >60 mL/min (ref >60)
GFR calc non Af Amer: >60 mL/min (ref >60)
Glucose, Bld: 187 mg/dL — ABNORMAL HIGH (ref 65–99)
Potassium: 4.3 mmol/L (ref 3.5–5.1)
Sodium: 139 mmol/L (ref 135–145)
Total Bilirubin: 0.4 mg/dL (ref 0.3–1.2)
Total Protein: 6.5 g/dL (ref 6.5–8.1)

## 2016-07-26 LAB — CBC WITH DIFFERENTIAL/PLATELET
Basophils Absolute: 0 10*3/uL (ref 0.0–0.1)
Basophils Relative: 0 %
Eosinophils Absolute: 0.2 10*3/uL (ref 0.0–0.7)
Eosinophils Relative: 3 %
HCT: 42.5 % (ref 36.0–46.0)
Hemoglobin: 14 g/dL (ref 12.0–15.0)
Lymphocytes Relative: 27 %
Lymphs Abs: 2.2 10*3/uL (ref 0.7–4.0)
MCH: 29.4 pg (ref 26.0–34.0)
MCHC: 32.9 g/dL (ref 30.0–36.0)
MCV: 89.3 fL (ref 78.0–100.0)
Monocytes Absolute: 0.6 10*3/uL (ref 0.1–1.0)
Monocytes Relative: 7 %
Neutro Abs: 5.3 10*3/uL (ref 1.7–7.7)
Neutrophils Relative %: 63 %
Platelets: 223 10*3/uL (ref 150–400)
RBC: 4.76 MIL/uL (ref 3.87–5.11)
RDW: 14.3 % (ref 11.5–15.5)
WBC: 8.3 10*3/uL (ref 4.0–10.5)

## 2016-07-26 MED ORDER — SODIUM CHLORIDE 0.9 % IV SOLN: Freq: Once | INTRAVENOUS | Status: DC

## 2016-07-26 MED ORDER — ACETAMINOPHEN 325 MG PO TABS: 650.0000 mg | ORAL_TABLET | ORAL | Status: DC

## 2016-07-26 MED ORDER — ACETAMINOPHEN 325 MG PO TABS
ORAL_TABLET | ORAL | Status: AC
  Administered 2016-07-26: 650 mg
  Filled 2016-07-26: qty 1

## 2016-07-26 MED ORDER — SODIUM CHLORIDE 0.9 % IV SOLN
6.0000 mg/kg | INTRAVENOUS | Status: DC
  Administered 2016-07-26: 700 mg via INTRAVENOUS
  Filled 2016-07-26: qty 70

## 2016-07-26 MED ORDER — DIPHENHYDRAMINE HCL 25 MG PO CAPS: 25.0000 mg | ORAL_CAPSULE | ORAL | Status: DC

## 2016-07-28 ENCOUNTER — Ambulatory Visit (INDEPENDENT_AMBULATORY_CARE_PROVIDER_SITE_OTHER): Payer: 59 | Admitting: Rheumatology

## 2016-07-28 ENCOUNTER — Telehealth: Payer: Self-pay | Admitting: Rheumatology

## 2016-07-28 ENCOUNTER — Encounter: Payer: Self-pay | Admitting: Rheumatology

## 2016-07-28 VITALS — BP 131/79 | HR 81 | Resp 13 | Ht 63.0 in | Wt 260.0 lb

## 2016-07-28 DIAGNOSIS — L409 Psoriasis, unspecified: Secondary | ICD-10-CM | POA: Diagnosis not present

## 2016-07-28 DIAGNOSIS — G8929 Other chronic pain: Secondary | ICD-10-CM

## 2016-07-28 DIAGNOSIS — M25562 Pain in left knee: Secondary | ICD-10-CM

## 2016-07-28 DIAGNOSIS — R5382 Chronic fatigue, unspecified: Secondary | ICD-10-CM

## 2016-07-28 DIAGNOSIS — M533 Sacrococcygeal disorders, not elsewhere classified: Secondary | ICD-10-CM | POA: Diagnosis not present

## 2016-07-28 DIAGNOSIS — L4050 Arthropathic psoriasis, unspecified: Secondary | ICD-10-CM

## 2016-07-28 DIAGNOSIS — M7581 Other shoulder lesions, right shoulder: Secondary | ICD-10-CM

## 2016-07-28 DIAGNOSIS — M7551 Bursitis of right shoulder: Secondary | ICD-10-CM

## 2016-07-28 DIAGNOSIS — M7552 Bursitis of left shoulder: Secondary | ICD-10-CM

## 2016-07-28 DIAGNOSIS — M25561 Pain in right knee: Secondary | ICD-10-CM

## 2016-07-28 DIAGNOSIS — M1712 Unilateral primary osteoarthritis, left knee: Secondary | ICD-10-CM

## 2016-07-28 DIAGNOSIS — Z8639 Personal history of other endocrine, nutritional and metabolic disease: Secondary | ICD-10-CM | POA: Diagnosis not present

## 2016-07-28 DIAGNOSIS — M654 Radial styloid tenosynovitis [de Quervain]: Secondary | ICD-10-CM

## 2016-07-28 DIAGNOSIS — R748 Abnormal levels of other serum enzymes: Secondary | ICD-10-CM

## 2016-07-28 DIAGNOSIS — Z79899 Other long term (current) drug therapy: Secondary | ICD-10-CM

## 2016-07-28 DIAGNOSIS — M778 Other enthesopathies, not elsewhere classified: Secondary | ICD-10-CM

## 2016-07-28 MED ORDER — FOLIC ACID 1 MG PO TABS: 1.0000 mg | ORAL_TABLET | Freq: Two times a day (BID) | ORAL | 4 refills | Status: DC

## 2016-07-28 MED ORDER — TRAMADOL HCL 50 MG PO TABS: 50.0000 mg | ORAL_TABLET | Freq: Two times a day (BID) | ORAL | 2 refills | Status: DC

## 2016-07-28 MED ORDER — METHOTREXATE (PF) 10 MG/0.2ML ~~LOC~~ SOAJ: 10.0000 mg | SUBCUTANEOUS | 0 refills | Status: DC

## 2016-07-28 MED FILL — UNIFINE PENTIPS 32GX5/32": 32G X 4 MM | 90 days supply | Qty: 100 | Fill #2

## 2016-07-28 MED FILL — UNIFINE PENTIPS 32GX5/32: 32G X 4 MM | 90 days supply | Qty: 100 | Fill #2

## 2016-07-28 MED FILL — traMADol HCL 50 MG TABS: 50 | 30 days supply | Qty: 60 | Fill #0

## 2016-07-28 NOTE — Telephone Encounter (Signed)
Aaron Edelman from [facility] called in regards to Earlyne Iba prescription that Mr. Carlyon Shadow sent in.  They are wanting to know if it was meant to be the normal vials for the methotrexate or the auto-injector it was written for.  [phone].  Thank you.

## 2016-07-28 NOTE — Progress Notes (Signed)
Medication Samples have been provided to the patient.  Drug name: Rasuvo       Strength: 10 mg        Qty: 2  LOTVT:[id number]  Exp.Date: 08/2016  Dosing instructions: Inject 10 mg under the skin once weekly.    The patient has been instructed regarding the correct time, dose, and frequency of taking this medication, including desired effects and most common side effects.  Reviewed expiration date with patient.  Advised her not to use Rasuvo after the month of February.  Instructed patient on how to use Rasuvo pens.    Cyndia Skeeters 9:21 AM 07/28/2016

## 2016-07-28 NOTE — Telephone Encounter (Signed)
Clarified prescription with Aaron Edelman at the pharmacy. Methotrexate 0.5 mL in the vial weekly 90 supply with no refills.

## 2016-07-28 NOTE — Telephone Encounter (Signed)
Please please give patient methotrexate injectable, 0.5 mL's once a week, dispense 90 day supply with preservatives.Also can refill syringes if needed

## 2016-08-07 DIAGNOSIS — F4323 Adjustment disorder with mixed anxiety and depressed mood: Secondary | ICD-10-CM | POA: Diagnosis not present

## 2016-08-07 MED FILL — VICTOZA 18 MG/3 ML INJECT P: 18 | 30 days supply | Qty: 9 | Fill #0

## 2016-08-22 DIAGNOSIS — F4323 Adjustment disorder with mixed anxiety and depressed mood: Secondary | ICD-10-CM | POA: Diagnosis not present

## 2016-08-25 ENCOUNTER — Other Ambulatory Visit: Payer: Self-pay | Admitting: Radiology

## 2016-08-25 NOTE — Progress Notes (Unsigned)
TB gold neg on 12/27/16

## 2016-09-03 ENCOUNTER — Other Ambulatory Visit: Payer: Self-pay | Admitting: Rheumatology

## 2016-09-03 ENCOUNTER — Encounter: Payer: Self-pay | Admitting: Rheumatology

## 2016-09-03 MED FILL — VICTOZA 18 MG/3 ML INJECT P: 18 | 30 days supply | Qty: 9 | Fill #1

## 2016-09-04 MED ORDER — METHOTREXATE (PF) 10 MG/0.2ML ~~LOC~~ SOAJ: 10.0000 mg | SUBCUTANEOUS | 0 refills | Status: DC

## 2016-09-04 MED FILL — ACCU-CHEK GUIDE TEST STRIP: 90 days supply | Qty: 200 | Fill #0

## 2016-09-04 MED FILL — ACCU-CHEK FASTCLIX LANCETS: 90 days supply | Qty: 204 | Fill #0

## 2016-09-04 NOTE — Telephone Encounter (Signed)
Please call and cancel patient's vitamin D that was to be drawn with the next infusion (I may have already sent to this request earlier)

## 2016-09-04 NOTE — Telephone Encounter (Signed)
Last Visit: 07/28/16 Next Visit: 12/29/16 Labs: 07/26/16 Nonfasting glucose is elevated at 187. History of same.; ALT is elevated at 68  Okay to refill MTX?

## 2016-09-04 NOTE — Telephone Encounter (Signed)
Ok to refill mtx

## 2016-09-04 NOTE — Telephone Encounter (Signed)
Okay to refill per Mr. Carlyon Shadow.

## 2016-09-05 DIAGNOSIS — F4323 Adjustment disorder with mixed anxiety and depressed mood: Secondary | ICD-10-CM | POA: Diagnosis not present

## 2016-09-06 ENCOUNTER — Other Ambulatory Visit (HOSPITAL_COMMUNITY): Payer: Self-pay | Admitting: *Deleted

## 2016-09-06 ENCOUNTER — Encounter (HOSPITAL_COMMUNITY): Payer: 59

## 2016-09-07 ENCOUNTER — Ambulatory Visit (HOSPITAL_COMMUNITY)
Admission: RE | Admit: 2016-09-07 | Discharge: 2016-09-07 | Disposition: A | Discharge status: Home / Self Care | Payer: 59 | Source: Ambulatory Visit | Attending: Rheumatology | Admitting: Rheumatology

## 2016-09-07 DIAGNOSIS — L4050 Arthropathic psoriasis, unspecified: Secondary | ICD-10-CM | POA: Diagnosis not present

## 2016-09-07 MED ORDER — SODIUM CHLORIDE 0.9 % IV SOLN
6.0000 mg/kg | INTRAVENOUS | Status: DC
  Administered 2016-09-07: 700 mg via INTRAVENOUS
  Filled 2016-09-07: qty 70

## 2016-09-07 MED ORDER — DIPHENHYDRAMINE HCL 25 MG PO CAPS: 25.0000 mg | ORAL_CAPSULE | ORAL | Status: DC

## 2016-09-07 MED ORDER — SODIUM CHLORIDE 0.9 % IV SOLN: INTRAVENOUS | Status: DC

## 2016-09-07 MED ORDER — ACETAMINOPHEN 325 MG PO TABS: 650.0000 mg | ORAL_TABLET | ORAL | Status: DC

## 2016-09-12 DIAGNOSIS — Z79899 Other long term (current) drug therapy: Secondary | ICD-10-CM | POA: Diagnosis not present

## 2016-09-12 DIAGNOSIS — E119 Type 2 diabetes mellitus without complications: Secondary | ICD-10-CM | POA: Diagnosis not present

## 2016-09-12 DIAGNOSIS — Z6841 Body Mass Index (BMI) 40.0 and over, adult: Secondary | ICD-10-CM | POA: Diagnosis not present

## 2016-09-12 DIAGNOSIS — Z5181 Encounter for therapeutic drug level monitoring: Secondary | ICD-10-CM | POA: Diagnosis not present

## 2016-09-12 DIAGNOSIS — Z794 Long term (current) use of insulin: Secondary | ICD-10-CM | POA: Diagnosis not present

## 2016-09-14 ENCOUNTER — Telehealth: Payer: Self-pay | Admitting: Pharmacist

## 2016-09-14 MED FILL — METHOTREXATE 25 MG/ML VIAL: 50 | 84 days supply | Qty: 6 | Fill #0

## 2016-09-14 MED FILL — FOLIC ACID 1 MG TABLET: 1 | 90 days supply | Qty: 180 | Fill #0

## 2016-09-14 NOTE — Telephone Encounter (Signed)
Received PA request from the pharmacy for Rasuvo.  Patient is prescribed methotrexate vials and takes 0.5 mL weekly.  Patient was given two Rasuvo samples in February 2018.  When patient recently requested refill of methotrexate through My Chart, refill request came in for Rasuvo.  I called patient to clarify.  She confirms she does NOT want Rasuvo prescription.  She needs refill of methotrexate vials.  I spoke to the pharmacy who confirms she has prescription for methotrexate vials on hold.  They cancelled the Rasuvo rx.  I informed patient that she has prescription on file at the pharmacy for methotrexate vials.  Patient voiced understanding and denies any questions or concerns regarding her medications at this time.    Elisabeth Most, Pharm.D., BCPS, CPP Clinical Pharmacist Pager: [phone] Phone: [phone] 09/14/2016 11:30 AM

## 2016-09-20 DIAGNOSIS — F4323 Adjustment disorder with mixed anxiety and depressed mood: Secondary | ICD-10-CM | POA: Diagnosis not present

## 2016-10-01 MED FILL — VICTOZA 18 MG/3 ML INJECT P: 18 | 30 days supply | Qty: 9 | Fill #2

## 2016-10-02 MED FILL — SYNJARDY 5-1,000 MG TABLET: 5-1000 | 90 days supply | Qty: 180 | Fill #2

## 2016-10-04 DIAGNOSIS — F4323 Adjustment disorder with mixed anxiety and depressed mood: Secondary | ICD-10-CM | POA: Diagnosis not present

## 2016-10-05 ENCOUNTER — Other Ambulatory Visit: Payer: Self-pay | Admitting: Radiology

## 2016-10-05 NOTE — Progress Notes (Signed)
Orders in for CBC CMP Tylenol Benadryl and Remicade

## 2016-10-18 ENCOUNTER — Other Ambulatory Visit (HOSPITAL_COMMUNITY): Payer: Self-pay | Admitting: *Deleted

## 2016-10-18 DIAGNOSIS — F4323 Adjustment disorder with mixed anxiety and depressed mood: Secondary | ICD-10-CM | POA: Diagnosis not present

## 2016-10-19 ENCOUNTER — Ambulatory Visit (HOSPITAL_COMMUNITY)
Admission: RE | Admit: 2016-10-19 | Discharge: 2016-10-19 | Disposition: A | Discharge status: Home / Self Care | Payer: 59 | Source: Ambulatory Visit | Attending: Rheumatology | Admitting: Rheumatology

## 2016-10-19 DIAGNOSIS — L4050 Arthropathic psoriasis, unspecified: Secondary | ICD-10-CM | POA: Diagnosis not present

## 2016-10-19 LAB — COMPREHENSIVE METABOLIC PANEL
ALT: 70 U/L — ABNORMAL HIGH (ref 14–54)
AST: 37 U/L (ref 15–41)
Albumin: 3.8 g/dL (ref 3.5–5.0)
Alkaline Phosphatase: 79 U/L (ref 38–126)
Anion gap: 10 (ref 5–15)
BUN: 18 mg/dL (ref 6–20)
CO2: 23 mmol/L (ref 22–32)
Calcium: 9.2 mg/dL (ref 8.9–10.3)
Chloride: 105 mmol/L (ref 101–111)
Creatinine, Ser: 0.6 mg/dL (ref 0.44–1.00)
GFR calc Af Amer: >60 mL/min (ref >60)
GFR calc non Af Amer: >60 mL/min (ref >60)
Glucose, Bld: 176 mg/dL — ABNORMAL HIGH (ref 65–99)
Potassium: 4.3 mmol/L (ref 3.5–5.1)
Sodium: 138 mmol/L (ref 135–145)
Total Bilirubin: 0.1 mg/dL — ABNORMAL LOW (ref 0.3–1.2)
Total Protein: 6.7 g/dL (ref 6.5–8.1)

## 2016-10-19 LAB — CBC WITH DIFFERENTIAL/PLATELET
Basophils Absolute: 0 10*3/uL (ref 0.0–0.1)
Basophils Relative: 1 %
Eosinophils Absolute: 0.3 10*3/uL (ref 0.0–0.7)
Eosinophils Relative: 3 %
HCT: 43.5 % (ref 36.0–46.0)
Hemoglobin: 14.1 g/dL (ref 12.0–15.0)
Lymphocytes Relative: 27 %
Lymphs Abs: 2.1 10*3/uL (ref 0.7–4.0)
MCH: 29.1 pg (ref 26.0–34.0)
MCHC: 32.4 g/dL (ref 30.0–36.0)
MCV: 89.7 fL (ref 78.0–100.0)
Monocytes Absolute: 0.7 10*3/uL (ref 0.1–1.0)
Monocytes Relative: 9 %
Neutro Abs: 4.6 10*3/uL (ref 1.7–7.7)
Neutrophils Relative %: 60 %
Platelets: 226 10*3/uL (ref 150–400)
RBC: 4.85 MIL/uL (ref 3.87–5.11)
RDW: 14.6 % (ref 11.5–15.5)
WBC: 7.6 10*3/uL (ref 4.0–10.5)

## 2016-10-19 MED ORDER — DIPHENHYDRAMINE HCL 25 MG PO CAPS: 25.0000 mg | ORAL_CAPSULE | ORAL | Status: DC

## 2016-10-19 MED ORDER — SODIUM CHLORIDE 0.9 % IV SOLN
6.0000 mg/kg | INTRAVENOUS | Status: DC
  Administered 2016-10-19: 700 mg via INTRAVENOUS
  Filled 2016-10-19: qty 70

## 2016-10-19 MED ORDER — SODIUM CHLORIDE 0.9 % IV SOLN
INTRAVENOUS | Status: DC
  Administered 2016-10-19: 09:00:00 via INTRAVENOUS

## 2016-10-19 MED ORDER — ACETAMINOPHEN 325 MG PO TABS: 650.0000 mg | ORAL_TABLET | ORAL | Status: DC

## 2016-10-19 NOTE — Progress Notes (Signed)
Labs are stable.

## 2016-10-19 NOTE — Progress Notes (Signed)
CBC normal

## 2016-10-31 MED FILL — traMADol HCL 50 MG TABS: 50 | 30 days supply | Qty: 60 | Fill #1

## 2016-11-01 DIAGNOSIS — F4323 Adjustment disorder with mixed anxiety and depressed mood: Secondary | ICD-10-CM | POA: Diagnosis not present

## 2016-11-01 MED FILL — VICTOZA 18 MG/3 ML INJECT P: 18 | 30 days supply | Qty: 9 | Fill #3

## 2016-11-01 MED FILL — UNIFINE PENTIPS 32GX5/32: 32G X 4 MM | 90 days supply | Qty: 100 | Fill #3

## 2016-11-01 MED FILL — UNIFINE PENTIPS 32GX5/32": 32G X 4 MM | 90 days supply | Qty: 100 | Fill #3

## 2016-11-14 ENCOUNTER — Other Ambulatory Visit: Payer: Self-pay | Admitting: Radiology

## 2016-11-14 NOTE — Progress Notes (Signed)
Infusion orders are current for patient CBC CMP Tylenol Benadryl TB gold not due yet, neg 12/2015 appointments are up to date and follow up appointment  is scheduled

## 2016-11-15 DIAGNOSIS — F4323 Adjustment disorder with mixed anxiety and depressed mood: Secondary | ICD-10-CM | POA: Diagnosis not present

## 2016-11-27 ENCOUNTER — Encounter: Payer: Self-pay | Admitting: Family Medicine

## 2016-11-27 ENCOUNTER — Ambulatory Visit: Payer: Self-pay

## 2016-11-27 ENCOUNTER — Ambulatory Visit (INDEPENDENT_AMBULATORY_CARE_PROVIDER_SITE_OTHER): Payer: 59 | Admitting: Family Medicine

## 2016-11-27 VITALS — BP 136/84 | HR 81 | Ht 63.0 in | Wt 258.0 lb

## 2016-11-27 DIAGNOSIS — M1711 Unilateral primary osteoarthritis, right knee: Secondary | ICD-10-CM | POA: Diagnosis not present

## 2016-11-27 DIAGNOSIS — M25561 Pain in right knee: Secondary | ICD-10-CM

## 2016-11-27 MED ORDER — DICLOFENAC SODIUM 2 % TD SOLN: 2.0000 "application " | Freq: Two times a day (BID) | TRANSDERMAL | 3 refills | Status: DC

## 2016-11-27 NOTE — Assessment & Plan Note (Signed)
Patient given injection today and tolerated the procedure well. We discussed icing regimen and home exercises. Topical anti-inflammatory's prescribed. Discussed vitamin D supplementation. Follow-up again in 3-4 weeks. Worsening pain consider viscous supplementation.

## 2016-11-27 NOTE — Patient Instructions (Addendum)
Good to see you  Good shoes with rigid bottom.  Tabitha [name], [place] or New balance greater then 700 Consider xelero  Injected the knee and I hope it helps Continue the vitamins See me again in 3 weeks and we will see what we think about the other injections.  Refilled the pennsaid

## 2016-11-27 NOTE — Progress Notes (Signed)
[name] [address] [place], [place] [address] Phone: [phone] Subjective:     CC: Left knee pain f/u   IPJ:[id number]  Tabitha [name] is a 51 y.o. female coming in with complaint of Left knee pain. Patient was found to have patellofemoral arthritis as well as a synovitis of the knee. Patient was even viscous supplementation. Doing very well on that side. Having worsening pain on the right knee. Increasing swelling. Patient is due for another infusion for her psoriatic arthritis. Notices when she gets closer to near her infusion she has more aches and pains. Having bilateral ankle pain as well. States that there is some swelling. This does not feel like herself.      Past Medical History:  Diagnosis Date  . Arthritis   . De Quervain's disease (tenosynovitis)   . Dermatophytosis, scalp   . Diabetes mellitus   . Hypercholesteremia   . Obesity   . Polycystic ovarian disease   . Psoriatic arthritis ([place])   . Wears glasses    Past Surgical History:  Procedure Laterality Date  . COLONOSCOPY    . DE QUERVAIN'S RELEASE  2012   left  . KNEE ARTHROSCOPY Right 05/06/2013   Procedure: RIGHT KNEE ARTHROSCOPY ;  Surgeon: Hessie Dibble, MD;  Location: [facility];  Service: Orthopedics;  Laterality: Right;  partial lateral minisectomy and chondroplasty  . WISDOM TOOTH EXTRACTION    . WRIST SURGERY  2001   carpel tunnel -rt   Social History   Social History  . Marital status: Divorced    Spouse name: N/A  . Number of children: N/A  . Years of education: N/A   Occupational History  . 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 Smoking status: Former Smoker    Packs/day: 1.30    Types: Cigarettes    Start date: 06/12/1984    Quit date: 03/18/1995  . Smokeless tobacco: Never Used     Comment: Not interested in returning to smoking.   . Alcohol use Yes     Comment: 2 per month  . Drug use: No  .  Sexual activity: Yes    Partners: Male     Comment: married   Other Topics Concern  . None   Social History Narrative  . None   Allergies  Allergen Reactions  . Chocolate Anaphylaxis  . Food     chocolate  . Statins     Intolerant  lft elevation  . Latex Rash   Family History  Problem Relation Age of Onset  . Diabetes Father   . Hypertension Father   . Stroke Father   . Kidney disease Father   . Cancer Mother 85       breast ca died at age 79  . Hypertension Unknown   . Diabetes type II Unknown   . Obesity Unknown   . Obesity Brother   . Hypertension Brother   . Heart disease Maternal Grandmother   . Alzheimer's disease Maternal Grandmother   . Diabetes Paternal Grandmother   . Heart disease Paternal Grandfather     Past medical history, social, surgical and family history all reviewed in electronic medical record.  No pertanent information unless stated regarding to the chief complaint.   Review of Systems: No headache, visual changes, nausea, vomiting, diarrhea, constipation, dizziness, abdominal pain, skin rash, fevers, chills, night sweats, weight loss, swollen lymph nodes, body  aches,   chest pain, shortness of breath, mood changes. Positive muscle aches positive joint swelling  Objective  Blood pressure 136/84, pulse 81, height 5\' 3"  (1.6 m), weight 258 lb (117 kg), SpO2 96 %.  Systems examined below as of 11/27/16 General: NAD A&O x3 mood, affect normal  HEENT: Pupils equal, extraocular movements intact no nystagmus Respiratory: not short of breath at rest or with speaking Cardiovascular: No lower extremity edema, non tender Skin: Warm dry intact with no signs of infection or rash on extremities or on axial skeleton. Abdomen: Soft nontender, no masses Neuro: Cranial nerves  intact, neurovascularly intact in all extremities with 2+ DTRs and 2+ pulses. Lymph: No lymphadenopathy appreciated today  Gait Mild antalgic MSK: tender with full range of motion and  good stability and symmetric strength and tone of shoulders, elbows, wrist,  knee hips and ankles bilaterally.   Knee: Right valgus deformity noted. Large thigh to calf ratio.  Tender to palpation over medial and PF joint line.  Lacks last 15 of extension and the last 10 of flexion instability with valgus force.  painful patellar compression. Patellar glide with severe crepitus. Patellar and quadriceps tendons unremarkable. Hamstring and quadriceps strength is normal. Contralateral knee shows arthritic changes but full range of motion.  After informed written and verbal consent, patient was seated on exam table. Right knee was prepped with alcohol swab and utilizing anterolateral approach, patient's right knee space was injected with 4:1  marcaine 0.5%: Kenalog 40mg /dL. Patient tolerated the procedure well without immediate complications.     Impression and Recommendations:     This case required medical decision making of moderate complexity.

## 2016-11-28 ENCOUNTER — Ambulatory Visit (HOSPITAL_COMMUNITY)
Admission: RE | Admit: 2016-11-28 | Discharge: 2016-11-28 | Disposition: A | Discharge status: Home / Self Care | Payer: 59 | Source: Ambulatory Visit | Attending: Rheumatology | Admitting: Rheumatology

## 2016-11-28 DIAGNOSIS — L4050 Arthropathic psoriasis, unspecified: Secondary | ICD-10-CM | POA: Insufficient documentation

## 2016-11-28 MED ORDER — SODIUM CHLORIDE 0.9 % IV SOLN
INTRAVENOUS | Status: DC
  Administered 2016-11-28: 09:00:00 via INTRAVENOUS

## 2016-11-28 MED ORDER — ACETAMINOPHEN 325 MG PO TABS: 650.0000 mg | ORAL_TABLET | ORAL | Status: DC

## 2016-11-28 MED ORDER — SODIUM CHLORIDE 0.9 % IV SOLN
6.0000 mg/kg | INTRAVENOUS | Status: AC
  Administered 2016-11-28: 700 mg via INTRAVENOUS
  Filled 2016-11-28 (×2): qty 70

## 2016-11-28 MED ORDER — DIPHENHYDRAMINE HCL 25 MG PO CAPS: 25.0000 mg | ORAL_CAPSULE | ORAL | Status: DC

## 2016-11-30 ENCOUNTER — Encounter (HOSPITAL_COMMUNITY): Payer: 59

## 2016-12-04 MED FILL — VICTOZA 18 MG/3 ML INJECT P: 18 | 30 days supply | Qty: 9 | Fill #0

## 2016-12-07 ENCOUNTER — Ambulatory Visit: Payer: 59 | Admitting: Family Medicine

## 2016-12-12 DIAGNOSIS — F4323 Adjustment disorder with mixed anxiety and depressed mood: Secondary | ICD-10-CM | POA: Diagnosis not present

## 2016-12-20 ENCOUNTER — Ambulatory Visit (INDEPENDENT_AMBULATORY_CARE_PROVIDER_SITE_OTHER): Payer: 59 | Admitting: Family Medicine

## 2016-12-20 ENCOUNTER — Encounter: Payer: Self-pay | Admitting: Family Medicine

## 2016-12-20 DIAGNOSIS — M170 Bilateral primary osteoarthritis of knee: Secondary | ICD-10-CM | POA: Diagnosis not present

## 2016-12-20 NOTE — Patient Instructions (Signed)
Good to see you  Once weekly for the next 3 weeks.  Stay active.  Will take some time for it to work.  Ice is still your friend.  See me again in

## 2016-12-20 NOTE — Assessment & Plan Note (Signed)
Patient even bilateral injections. Started with the viscous supplementation on the right knee today. Steroid injection left knee. Following up in 1 week for a second in a series of 4 injections for viscous supplementation of the right knee. Worsening symptoms may need to consider this. Patient on the contralateral side as well. Continue conservative therapy otherwise.

## 2016-12-20 NOTE — Progress Notes (Signed)
[name] [place],  [address] Phone: [phone] Subjective:     CC: Right knee pain f/u   [id number]  Tabitha [name] is a 51 y.o. female coming in with complaint of right knee pain. Patient was found to have patellofemoral arthritis as well as a synovitis of the knee. Patient was seen 3 weeks ago and was given an injection. Known to have some moderate arthritic changes. Patient was to do conservative therapy otherwise. Patient states having worsening pain at this time. Patient states that also the contralateral knee is starting to hurt her. Rates the severity pain is 8 out of 10. Patient did respond well to pursue supplementation previously. This is greater than 6 months ago.       Past Medical History:  Diagnosis Date  . Arthritis   . De Quervain's disease (tenosynovitis)   . Dermatophytosis, scalp   . Diabetes mellitus   . Hypercholesteremia   . Obesity   . Polycystic ovarian disease   . Psoriatic arthritis ([place])   . Wears glasses    Past Surgical History:  Procedure Laterality Date  . COLONOSCOPY    . DE QUERVAIN'S RELEASE  2012   left  . KNEE ARTHROSCOPY Right 05/06/2013   Procedure: RIGHT KNEE ARTHROSCOPY ;  Surgeon: Hessie Dibble, MD;  Location: [facility];  Service: Orthopedics;  Laterality: Right;  partial lateral minisectomy and chondroplasty  . WISDOM TOOTH EXTRACTION    . WRIST SURGERY  2001   carpel tunnel -rt   Social History   Social History  . Marital status: Divorced    Spouse name: N/A  . Number of children: N/A  . Years of education: N/A   Occupational History  . 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 Smoking status: Former Smoker    Packs/day: 1.30    Types: Cigarettes    Start date: 06/12/1984    Quit date: 03/18/1995  . Smokeless tobacco: Never Used     Comment: Not interested in returning to smoking.   . Alcohol use Yes     Comment: 2  per month  . Drug use: No  . Sexual activity: Yes    Partners: Male     Comment: married   Other Topics Concern  . None   Social History Narrative  . None   Allergies  Allergen Reactions  . Chocolate Anaphylaxis  . Food     chocolate  . Statins     Intolerant  lft elevation  . Latex Rash   Family History  Problem Relation Age of Onset  . Diabetes Father   . Hypertension Father   . Stroke Father   . Kidney disease Father   . Cancer Mother 42       breast ca died at age 54  . Hypertension Unknown   . Diabetes type II Unknown   . Obesity Unknown   . Obesity Brother   . Hypertension Brother   . Heart disease Maternal Grandmother   . Alzheimer's disease Maternal Grandmother   . Diabetes Paternal Grandmother   . Heart disease Paternal Grandfather     Past medical history, social, surgical and family history all reviewed in electronic medical record.  No pertanent information unless stated regarding to the chief complaint.    Review of Systems: No headache, visual changes, nausea, vomiting, diarrhea, constipation, dizziness, abdominal pain, skin rash, fevers, chills,  night sweats, weight loss, swollen lymph nodes, body aches, joint swelling, muscle aches, chest pain, shortness of breath, mood changes.   Objective  Blood pressure 120/80, pulse 76, height 5\' 3"  (1.6 m), SpO2 96 %.  Systems examined below as of 12/20/16 General: NAD A&O x3 mood, affect normal  HEENT: Pupils equal, extraocular movements intact no nystagmus Respiratory: not short of breath at rest or with speaking Cardiovascular: No lower extremity edema, non tender Skin: Warm dry intact with no signs of infection or rash on extremities or on axial skeleton. Abdomen: Soft nontender, no masses Neuro: Cranial nerves  intact, neurovascularly intact in all extremities with 2+ DTRs and 2+ pulses. Lymph: No lymphadenopathy appreciated today  Gait normal with good balance and coordination.  MSK: Non tender with  full range of motion and good stability and symmetric strength and tone of shoulders, elbows, wrist,  hips and ankles bilaterally.      Knee: Bilateral valgus deformity noted. Large thigh to calf ratio.  Tender to palpation over medial and PF joint line.  ROM full in flexion and extension and lower leg rotation. instability with valgus force.  painful patellar compression. Patellar glide with moderate crepitus. Patellar and quadriceps tendons unremarkable. Hamstring and quadriceps strength is normal.   After informed written and verbal consent, patient was seated on exam table. Right knee was prepped with alcohol swab and utilizing anterolateral approach, patient's right knee space was injected with15 mg/2.5 mL of Orthovisc(sodium hyaluronate) in a prefilled syringe was injected easily into the knee through a 22-gauge needle..Patient tolerated the procedure well without immediate complications.  After informed written and verbal consent, patient was seated on exam table. Left knee was prepped with alcohol swab and utilizing anterolateral approach, patient's left knee space was injected with 4:1  marcaine 0.5%: Kenalog 40mg /dL. Patient tolerated the procedure well without immediate complications.     Impression and Recommendations:     This case required medical decision making of moderate complexity.

## 2016-12-21 MED FILL — FOLIC ACID 1 MG TABLET: 1 | 90 days supply | Qty: 180 | Fill #1

## 2016-12-22 MED FILL — ACCU-CHEK GUIDE TEST STRIP: 90 days supply | Qty: 200 | Fill #1

## 2016-12-27 ENCOUNTER — Ambulatory Visit (INDEPENDENT_AMBULATORY_CARE_PROVIDER_SITE_OTHER): Payer: 59 | Admitting: Family Medicine

## 2016-12-27 ENCOUNTER — Encounter: Payer: Self-pay | Admitting: Family Medicine

## 2016-12-27 DIAGNOSIS — M170 Bilateral primary osteoarthritis of knee: Secondary | ICD-10-CM | POA: Diagnosis not present

## 2016-12-27 NOTE — Progress Notes (Signed)
[name] [place], [place] [address] Phone: [phone] Subjective:     CC: Right knee pain f/u   [id number]  Tabitha [name] is a 51 y.o. female coming in with complaint of right knee pain. Patient was found to have patellofemoral arthritis as well as a synovitis of the knee. Here for 2nd orthovisc on the right knee.  Left knee given steroid, with the left knee no improvement failed conservative therapy. Patient is still having swelling on the side. Has been a little more active.      Past Medical History:  Diagnosis Date  . Arthritis   . De Quervain's disease (tenosynovitis)   . Dermatophytosis, scalp   . Diabetes mellitus   . Hypercholesteremia   . Obesity   . Polycystic ovarian disease   . Psoriatic arthritis ([place])   . Wears glasses    Past Surgical History:  Procedure Laterality Date  . COLONOSCOPY    . DE QUERVAIN'S RELEASE  2012   left  . KNEE ARTHROSCOPY Right 05/06/2013   Procedure: RIGHT KNEE ARTHROSCOPY ;  Surgeon: Hessie Dibble, MD;  Location: [facility];  Service: Orthopedics;  Laterality: Right;  partial lateral minisectomy and chondroplasty  . WISDOM TOOTH EXTRACTION    . WRIST SURGERY  2001   carpel tunnel -rt   Social History   Social History  . Marital status: Divorced    Spouse name: N/A  . Number of children: N/A  . Years of education: N/A   Occupational History  . 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 Smoking status: Former Smoker    Packs/day: 1.30    Types: Cigarettes    Start date: 06/12/1984    Quit date: 03/18/1995  . Smokeless tobacco: Never Used     Comment: Not interested in returning to smoking.   . Alcohol use Yes     Comment: 2 per month  . Drug use: No  . Sexual activity: Yes    Partners: Male     Comment: married   Other Topics Concern  . Not on file   Social History Narrative  . No narrative on file   Allergies    Allergen Reactions  . Chocolate Anaphylaxis  . Food     chocolate  . Statins     Intolerant  lft elevation  . Latex Rash   Family History  Problem Relation Age of Onset  . Diabetes Father   . Hypertension Father   . Stroke Father   . Kidney disease Father   . Cancer Mother 72       breast ca died at age 68  . Hypertension Unknown   . Diabetes type II Unknown   . Obesity Unknown   . Obesity Brother   . Hypertension Brother   . Heart disease Maternal Grandmother   . Alzheimer's disease Maternal Grandmother   . Diabetes Paternal Grandmother   . Heart disease Paternal Grandfather     Past medical history, social, surgical and family history all reviewed in electronic medical record.  No pertanent information unless stated regarding to the chief complaint.    Review of Systems: No headache, visual changes, nausea, vomiting, diarrhea, constipation, dizziness, abdominal pain, skin rash, fevers, chills, night sweats, weight loss, swollen lymph nodes, body aches, joint swelling,chest pain, shortness of breath, mood changes.  Positive muscle aches  Objective  Blood pressure 128/82, pulse  79, resp. rate 16, weight 259 lb (117.5 kg), SpO2 97 %.  Systems examined below as of 12/27/16 General: NAD A&O x3 mood, affect normal  HEENT: Pupils equal, extraocular movements intact no nystagmus Respiratory: not short of breath at rest or with speaking Cardiovascular: No lower extremity edema, non tender Skin: Warm dry intact with no signs of infection or rash on extremities or on axial skeleton. Abdomen: Soft nontender, no masses Neuro: Cranial nerves  intact, neurovascularly intact in all extremities with 2+ DTRs and 2+ pulses. Lymph: No lymphadenopathy appreciated today  Gait normal with good balance and coordination.  MSK: Non tender with full range of motion and good stability and symmetric strength and tone of shoulders, elbows, wrist,  knee hips and ankles bilaterally.       Knee:bilateral  valgus deformity noted. Large thigh to calf ratio.  Tender to palpation over medial and PF joint line. Mild less pain on the right side ROM full in flexion and extension and lower leg rotation. instability with valgus force.  painful patellar compression. Patellar glide with moderate crepitus. Patellar and quadriceps tendons unremarkable. Hamstring and quadriceps strength is normal.   After informed written and verbal consent, patient was seated on exam table. Right knee was prepped with alcohol swab and utilizing anterolateral approach, patient's right knee space was injected with15 mg/2.5 mL of Orthovisc(sodium hyaluronate) in a prefilled syringe was injected easily into the knee through a 22-gauge needle..Patient tolerated the procedure well without immediate complications.  After informed written and verbal consent, patient was seated on exam table. Left knee was prepped with alcohol swab and utilizing anterolateral approach, patient's left knee space was injected with15 mg/2.5 mL of Orthovisc(sodium hyaluronate) in a prefilled syringe was injected easily into the knee through a 22-gauge needle..Patient tolerated the procedure well without immediate complications..     Impression and Recommendations:     This case required medical decision making of moderate complexity.

## 2016-12-27 NOTE — Patient Instructions (Signed)
Good to see you  2 more on the right and 3 on the left.  Ice is your friend.  See you again next week!

## 2016-12-27 NOTE — Assessment & Plan Note (Signed)
Viscous supplementation bilaterally. Second injection on the right side. They show improvement. First injection of the left side. Follow-up in one week for third injection in the right knee and second injection left knee and continue conservative therapy otherwise.

## 2016-12-28 DIAGNOSIS — F4323 Adjustment disorder with mixed anxiety and depressed mood: Secondary | ICD-10-CM | POA: Diagnosis not present

## 2016-12-29 ENCOUNTER — Ambulatory Visit: Payer: 59 | Admitting: Rheumatology

## 2016-12-29 ENCOUNTER — Encounter: Payer: Self-pay | Admitting: Rheumatology

## 2016-12-29 ENCOUNTER — Ambulatory Visit (INDEPENDENT_AMBULATORY_CARE_PROVIDER_SITE_OTHER): Payer: 59 | Admitting: Rheumatology

## 2016-12-29 VITALS — BP 129/77 | HR 74 | Resp 15 | Ht 63.0 in | Wt 256.0 lb

## 2016-12-29 DIAGNOSIS — M19041 Primary osteoarthritis, right hand: Secondary | ICD-10-CM | POA: Diagnosis not present

## 2016-12-29 DIAGNOSIS — Z79899 Other long term (current) drug therapy: Secondary | ICD-10-CM | POA: Diagnosis not present

## 2016-12-29 DIAGNOSIS — M19042 Primary osteoarthritis, left hand: Secondary | ICD-10-CM | POA: Diagnosis not present

## 2016-12-29 DIAGNOSIS — M542 Cervicalgia: Secondary | ICD-10-CM | POA: Diagnosis not present

## 2016-12-29 DIAGNOSIS — L4050 Arthropathic psoriasis, unspecified: Secondary | ICD-10-CM

## 2016-12-29 DIAGNOSIS — Z5181 Encounter for therapeutic drug level monitoring: Secondary | ICD-10-CM | POA: Diagnosis not present

## 2016-12-29 DIAGNOSIS — L409 Psoriasis, unspecified: Secondary | ICD-10-CM | POA: Diagnosis not present

## 2016-12-29 DIAGNOSIS — M170 Bilateral primary osteoarthritis of knee: Secondary | ICD-10-CM

## 2016-12-29 MED ORDER — CLOBETASOL PROPIONATE 0.05 % EX CREA: 1.0000 "application " | TOPICAL_CREAM | Freq: Two times a day (BID) | CUTANEOUS | 2 refills | Status: DC

## 2016-12-29 MED ORDER — METHOTREXATE SODIUM CHEMO INJECTION (PF) 50 MG/2ML: INTRAMUSCULAR | 0 refills | Status: DC

## 2016-12-29 MED FILL — METHOTREXATE 25 MG/ML VIAL: 50 | 84 days supply | Qty: 6 | Fill #0

## 2016-12-29 MED FILL — CLOBETASOL 0.05% CREAM: 0.05 | 15 days supply | Qty: 30 | Fill #0

## 2016-12-29 NOTE — Progress Notes (Signed)
Office Visit Note  Patient: Tabitha [name]             Date of Birth: 1965-08-11           MRN: [id number]             PCP: Hoyt Koch, MD Referring: Hoyt Koch, * Visit Date: 12/29/2016 Occupation: _0 @    Subjective:  Arthritis (Doing good)   History of Present Illness: Tabitha [name] is a 51 y.o. female  Was last seen in our office on February 2018 for psoriatic arthritis, psoriasis, high risk prescription (Remicade 6 mg/kg and methotrexate injectable, 12.5 mg every week.  Today, pt states she is doing well w/ above treatment. Patient reports that she is doing really well with Remicade infusions 6 mg/kg every 6 weeks and methotrexate injectable however she does have mild serratus flares at times. She uses clobetasol ointment for treatment. She's been using betazole ointment for several weeks now a small amount once a day. She has run out of the medication and she needs to get a refill.  The last time she had a flare like this (minor) was approximately 2 months ago. Patient reports of the Remicade infusions to really well but about a week or 2 before she is due, her joints started feeling achy and uncomfortable but tolerable. She states that some occasions, the joint pain is not too bad and other times just prior to her infusion, the joint pain can be more uncomfortable. It is based on her activity prior to getting the infusions. Overall she states it's tolerable and she is happy with the treatment plan  Asian gets labs drawn every other time that she gets her Remicade infusion. She is due for the next Remicade infusion at the end of July. Her last labs were done May 2018 and there were within normal limits.  Patient reports that the most trouble that she has are with her knees. Her PCP is giving her mono this. The last one was done about a year ago. She had very good relief. She is due for repeat injection through her PCP and will coordinate with them  soon.  Patient's TB gold was done July 2017 and was negative. She is due for repeat TB gold now. She states that she will get that at her next Remicade infusion  Activities of Daily Living:  Patient reports morning stiffness for 15 minutes.   Patient Denies nocturnal pain.  Difficulty dressing/grooming: Denies Difficulty climbing stairs: Denies Difficulty getting out of chair: Denies Difficulty using hands for taps, buttons, cutlery, and/or writing: Denies   No Rheumatology ROS completed.   PMFS History:  Patient Active Problem List   Diagnosis Date Noted  . Degenerative arthritis of knee, bilateral 12/20/2016  . Degenerative arthritis of right knee 11/27/2016  . Patellofemoral arthritis of left knee 06/22/2015  . Acute upper respiratory infection 02/24/2015  . Wheezing 02/24/2015  . Bilateral shoulder bursitis 12/29/2014  . SI (sacroiliac) joint dysfunction 10/04/2013  . Nonallopathic lesion of sacral region 10/04/2013  . Nonallopathic lesion of thoracic region 10/04/2013  . Abnormal mammogram 07/04/2012  . Obesity 05/28/2012  . Psoriatic arthritis ([place]) 03/12/2012  . Diabetes mellitus due to underlying condition with hyperglycemia ([place]) 10/03/2011  . Knee pain, bilateral 10/03/2011  . Pes planus 10/03/2011  . Mechanical low back pain 09/06/2011  . Marriage separation 06/27/2011  . Psoriasis 03/30/2011  . Tendonitis of shoulder, right 12/16/2010  . Elevated liver enzymes 07/29/2010  .  DE QUERVAIN'S TENOSYNOVITIS, LEFT WRIST 03/15/2010  . HYPERCHOLESTEROLEMIA 05/25/2009  . NONALLOPATHIC LESION OF RIB CAGE NEC 05/25/2009  . POLYCYSTIC OVARIES 04/07/2009  . Nonallopathic lesion of lumbar region 04/07/2009    Past Medical History:  Diagnosis Date  . Arthritis   . De Quervain's disease (tenosynovitis)   . Dermatophytosis, scalp   . Diabetes mellitus   . Hypercholesteremia   . Obesity   . Polycystic ovarian disease   . Psoriatic arthritis ([place])   . Wears glasses      Family History  Problem Relation Age of Onset  . Diabetes Father   . Hypertension Father   . Stroke Father   . Kidney disease Father   . Cancer Mother 63       breast ca died at age 45  . Hypertension Unknown   . Diabetes type II Unknown   . Obesity Unknown   . Obesity Brother   . Hypertension Brother   . Heart disease Maternal Grandmother   . Alzheimer's disease Maternal Grandmother   . Diabetes Paternal Grandmother   . Heart disease Paternal Grandfather    Past Surgical History:  Procedure Laterality Date  . CARPAL TUNNEL RELEASE    . COLONOSCOPY    . DE QUERVAIN'S RELEASE  2012   left  . KNEE ARTHROPLASTY    . KNEE ARTHROSCOPY Right 05/06/2013   Procedure: RIGHT KNEE ARTHROSCOPY ;  Surgeon: Hessie Dibble, MD;  Location: [facility];  Service: Orthopedics;  Laterality: Right;  partial lateral minisectomy and chondroplasty  . WISDOM TOOTH EXTRACTION    . WRIST SURGERY  2001   carpel tunnel -rt   Social History   Social History Narrative  . No narrative on file     Objective: Vital Signs: BP 129/77 (BP Location: Left Arm, Patient Position: Sitting, Cuff Size: Normal)   Pulse 74   Resp 15   Ht 5' 3" (1.6 m)   Wt 256 lb (116.1 kg)   BMI 45.35 kg/m    Physical Exam  Constitutional: She is oriented to person, place, and time. She appears well-developed and well-nourished.  HENT:  Head: Normocephalic and atraumatic.  Eyes: Pupils are equal, round, and reactive to light. EOM are normal.  Cardiovascular: Normal rate, regular rhythm and normal heart sounds.  Exam reveals no gallop and no friction rub.   No murmur heard. Pulmonary/Chest: Effort normal and breath sounds normal. She has no wheezes. She has no rales.  Abdominal: Soft. Bowel sounds are normal. She exhibits no distension. There is no tenderness. There is no guarding. No hernia.  Musculoskeletal: Normal range of motion. She exhibits no edema, tenderness or deformity.  Lymphadenopathy:     She has no cervical adenopathy.  Neurological: She is alert and oriented to person, place, and time. Coordination normal.  Skin: Skin is warm and dry. Capillary refill takes less than 2 seconds. No rash noted.  Psychiatric: She has a normal mood and affect. Her behavior is normal.  Nursing note and vitals reviewed.    Musculoskeletal Exam:  Full range of motion of all joints Grip strength is equal and strong bilaterally Fibromyalgia tender points are absent  CDAI Exam: CDAI Homunculus Exam:   Joint Counts:  CDAI Tender Joint count: 0 CDAI Swollen Joint count: 0  Global Assessments:  Patient Global Assessment: 2 Provider Global Assessment: 2  CDAI Calculated Score: 4  No synovitis on examination  Investigation: No additional findings. Hospital Outpatient Visit on 10/19/2016  Component Date Value  Ref Range Status  . WBC 10/19/2016 7.6  4.0 - 10.5 K/uL Final  . RBC 10/19/2016 4.85  3.87 - 5.11 MIL/uL Final  . Hemoglobin 10/19/2016 14.1  12.0 - 15.0 g/dL Final  . HCT 10/19/2016 43.5  36.0 - 46.0 % Final  . MCV 10/19/2016 89.7  78.0 - 100.0 fL Final  . MCH 10/19/2016 29.1  26.0 - 34.0 pg Final  . MCHC 10/19/2016 32.4  30.0 - 36.0 g/dL Final  . RDW 10/19/2016 14.6  11.5 - 15.5 % Final  . Platelets 10/19/2016 226  150 - 400 K/uL Final  . Neutrophils Relative % 10/19/2016 60  % Final  . Neutro Abs 10/19/2016 4.6  1.7 - 7.7 K/uL Final  . Lymphocytes Relative 10/19/2016 27  % Final  . Lymphs Abs 10/19/2016 2.1  0.7 - 4.0 K/uL Final  . Monocytes Relative 10/19/2016 9  % Final  . Monocytes Absolute 10/19/2016 0.7  0.1 - 1.0 K/uL Final  . Eosinophils Relative 10/19/2016 3  % Final  . Eosinophils Absolute 10/19/2016 0.3  0.0 - 0.7 K/uL Final  . Basophils Relative 10/19/2016 1  % Final  . Basophils Absolute 10/19/2016 0.0  0.0 - 0.1 K/uL Final  . Sodium 10/19/2016 138  135 - 145 mmol/L Final  . Potassium 10/19/2016 4.3  3.5 - 5.1 mmol/L Final  . Chloride 10/19/2016 105  101 - 111  mmol/L Final  . CO2 10/19/2016 23  22 - 32 mmol/L Final  . Glucose, Bld 10/19/2016 176* 65 - 99 mg/dL Final  . BUN 10/19/2016 18  6 - 20 mg/dL Final  . Creatinine, Ser 10/19/2016 0.60  0.44 - 1.00 mg/dL Final  . Calcium 10/19/2016 9.2  8.9 - 10.3 mg/dL Final  . Total Protein 10/19/2016 6.7  6.5 - 8.1 g/dL Final  . Albumin 10/19/2016 3.8  3.5 - 5.0 g/dL Final  . AST 10/19/2016 37  15 - 41 U/L Final  . ALT 10/19/2016 70* 14 - 54 U/L Final  . Alkaline Phosphatase 10/19/2016 79  38 - 126 U/L Final  . Total Bilirubin 10/19/2016 0.1* 0.3 - 1.2 mg/dL Final  . GFR calc non Af Amer 10/19/2016 >60  >60 mL/min Final  . GFR calc Af Amer 10/19/2016 >60  >60 mL/min Final   Comment: (NOTE) The eGFR has been calculated using the CKD EPI equation. This calculation has not been validated in all clinical situations. eGFR's persistently <60 mL/min signify possible Chronic Kidney Disease.   Georgiann Hahn gap 10/19/2016 10  5 - 15 Final  Hospital Outpatient Visit on 07/26/2016  Component Date Value Ref Range Status  . WBC 07/26/2016 8.3  4.0 - 10.5 K/uL Final  . RBC 07/26/2016 4.76  3.87 - 5.11 MIL/uL Final  . Hemoglobin 07/26/2016 14.0  12.0 - 15.0 g/dL Final  . HCT 07/26/2016 42.5  36.0 - 46.0 % Final  . MCV 07/26/2016 89.3  78.0 - 100.0 fL Final  . MCH 07/26/2016 29.4  26.0 - 34.0 pg Final  . MCHC 07/26/2016 32.9  30.0 - 36.0 g/dL Final  . RDW 07/26/2016 14.3  11.5 - 15.5 % Final  . Platelets 07/26/2016 223  150 - 400 K/uL Final  . Neutrophils Relative % 07/26/2016 63  % Final  . Neutro Abs 07/26/2016 5.3  1.7 - 7.7 K/uL Final  . Lymphocytes Relative 07/26/2016 27  % Final  . Lymphs Abs 07/26/2016 2.2  0.7 - 4.0 K/uL Final  . Monocytes Relative 07/26/2016 7  % Final  .  Monocytes Absolute 07/26/2016 0.6  0.1 - 1.0 K/uL Final  . Eosinophils Relative 07/26/2016 3  % Final  . Eosinophils Absolute 07/26/2016 0.2  0.0 - 0.7 K/uL Final  . Basophils Relative 07/26/2016 0  % Final  . Basophils Absolute  07/26/2016 0.0  0.0 - 0.1 K/uL Final  . Sodium 07/26/2016 139  135 - 145 mmol/L Final  . Potassium 07/26/2016 4.3  3.5 - 5.1 mmol/L Final  . Chloride 07/26/2016 106  101 - 111 mmol/L Final  . CO2 07/26/2016 24  22 - 32 mmol/L Final  . Glucose, Bld 07/26/2016 187* 65 - 99 mg/dL Final  . BUN 07/26/2016 14  6 - 20 mg/dL Final  . Creatinine, Ser 07/26/2016 0.70  0.44 - 1.00 mg/dL Final  . Calcium 07/26/2016 9.4  8.9 - 10.3 mg/dL Final  . Total Protein 07/26/2016 6.5  6.5 - 8.1 g/dL Final  . Albumin 07/26/2016 3.6  3.5 - 5.0 g/dL Final  . AST 07/26/2016 40  15 - 41 U/L Final  . ALT 07/26/2016 68* 14 - 54 U/L Final  . Alkaline Phosphatase 07/26/2016 75  38 - 126 U/L Final  . Total Bilirubin 07/26/2016 0.4  0.3 - 1.2 mg/dL Final  . GFR calc non Af Amer 07/26/2016 >60  >60 mL/min Final  . GFR calc Af Amer 07/26/2016 >60  >60 mL/min Final   Comment: (NOTE) The eGFR has been calculated using the CKD EPI equation. This calculation has not been validated in all clinical situations. eGFR's persistently <60 mL/min signify possible Chronic Kidney Disease.   . Anion gap 07/26/2016 9  5 - 15 Final     Imaging: No results found.  Speciality Comments: No specialty comments available.    Procedures:  No procedures performed Allergies: Chocolate; Food; Statins; and Latex   Assessment / Plan:     Visit Diagnoses: Psoriatic arthritis ([place])  Psoriasis  High risk medication use   Plan: #1: Psoriatic arthritis and psoriasis. Doing well. Has minor flare to bilateral inner thighs. Patient is controlling that with clobetasol ointment that we prescribed. She is out of clobetasol and needs a refill. Her last flare was approximately 2 months ago. The flares are minor.  #2: High risk prescription.  On Remicade infusion 6 mg/kg every 6 weeks Patient tolerates the medication well. On most infusion she does well but on occasion, she starts feeling minor joint pain discomfort a week or 2 prior to  the infusion. Is very tolerable. Methotrexate injectable (generic); 0.5 mL's every Friday. Patient needs a refill. Tolerating the medication well. Folic acid 2 mg every day.  #3: OA of the hands. DIP prominence with Heberden's nodes present  #4: Neck pain. Patient occasionally feels popping and snapping in her neck. We'll see her trainer who will help her exercise her neck muscles properly.  #5: OA of bilateral knees. Patient gets monos this from her PCP. Her last mono this was about a year ago. Patient responded very well to the medication. She is coordinating the next set of injections for his office soon.  #5: Return to clinic in 5 months.  Orders: No orders of the defined types were placed in this encounter.  No orders of the defined types were placed in this encounter.   Face-to-face time spent with patient was 30 minutes. Greater than 50% of time was spent in counseling and coordination of care.  Follow-Up Instructions: No Follow-up on file.   Dodd Schmid Carlyon Shadow, PA-C   I examined and evaluated the  patient with Eliezer Lofts PA. On my exam today she had no synovitis. We will continue current therapy for right now. The plan of care was discussed as noted above.  Bo Merino, MD  Note - This record has been created using Editor, commissioning.  Chart creation errors have been sought, but may not always  have been located. Such creation errors do not reflect on  the standard of medical care.

## 2016-12-30 MED FILL — traMADol HCL 50 MG TABS: 50 | 30 days supply | Qty: 60 | Fill #2

## 2016-12-31 MED FILL — VICTOZA 18 MG/3 ML INJECT P: 18 | 30 days supply | Qty: 9 | Fill #1

## 2017-01-01 MED FILL — SYNJARDY 5-1,000 MG TABLET: 5-1000 | 90 days supply | Qty: 180 | Fill #3

## 2017-01-03 ENCOUNTER — Ambulatory Visit (INDEPENDENT_AMBULATORY_CARE_PROVIDER_SITE_OTHER): Payer: 59 | Admitting: Family Medicine

## 2017-01-03 ENCOUNTER — Encounter: Payer: Self-pay | Admitting: Family Medicine

## 2017-01-03 DIAGNOSIS — M170 Bilateral primary osteoarthritis of knee: Secondary | ICD-10-CM | POA: Diagnosis not present

## 2017-01-03 NOTE — Progress Notes (Signed)
[name] [place], [place] [address] Phone: [phone] Subjective:     CC: Right knee pain f/u   Tabitha [name]  Tabitha [name] is a 51 y.o. female coming in with complaint of right knee pain. Patient was found to have patellofemoral arthritis as well as a synovitis of the knee. Here for Third injection in the right knee as well as patient here for the second injection on the left knee for viscous supplementation. Patient states improvement of both. More on the right side.      Past Medical History:  Diagnosis Date  . Arthritis   . De Quervain's disease (tenosynovitis)   . Dermatophytosis, scalp   . Diabetes mellitus   . Hypercholesteremia   . Obesity   . Polycystic ovarian disease   . Psoriatic arthritis ([place])   . Wears glasses    Past Surgical History:  Procedure Laterality Date  . CARPAL TUNNEL RELEASE    . COLONOSCOPY    . DE QUERVAIN'S RELEASE  2012   left  . KNEE ARTHROPLASTY    . KNEE ARTHROSCOPY Right 05/06/2013   Procedure: RIGHT KNEE ARTHROSCOPY ;  Surgeon: Hessie Dibble, MD;  Location: [facility];  Service: Orthopedics;  Laterality: Right;  partial lateral minisectomy and chondroplasty  . WISDOM TOOTH EXTRACTION    . WRIST SURGERY  2001   carpel tunnel -rt   Social History   Social History  . Marital status: Divorced    Spouse name: N/A  . Number of children: N/A  . Years of education: N/A   Occupational History  . 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 Smoking status: Former Smoker    Packs/day: 1.30    Years: 12.00    Types: Cigarettes    Start date: 06/12/1984    Quit date: 03/18/1995  . Smokeless tobacco: Never Used     Comment: Not interested in returning to smoking.   . Alcohol use Yes     Comment: 2 per month  . Drug use: No  . Sexual activity: Yes    Partners: Male     Comment: married   Other Topics Concern  . None   Social History  Narrative  . None   Allergies  Allergen Reactions  . Chocolate Anaphylaxis  . Food     chocolate  . Statins     Intolerant  lft elevation  . Latex Rash   Family History  Problem Relation Age of Onset  . Diabetes Father   . Hypertension Father   . Stroke Father   . Kidney disease Father   . Cancer Mother 61       breast ca died at age 38  . Hypertension Unknown   . Diabetes type II Unknown   . Obesity Unknown   . Obesity Brother   . Hypertension Brother   . Heart disease Maternal Grandmother   . Alzheimer's disease Maternal Grandmother   . Diabetes Paternal Grandmother   . Heart disease Paternal Grandfather     Past medical history, social, surgical and family history all reviewed in electronic medical record.  No pertanent information unless stated regarding to the chief complaint.    Review of Systems: No headache, visual changes, nausea, vomiting, diarrhea, constipation, dizziness, abdominal pain, skin rash, fevers, chills, night sweats, weight loss, swollen lymph nodes, body aches, joint swelling, muscle aches, chest pain, shortness of breath,  mood changes.    Objective  Blood pressure 118/80, pulse 80, SpO2 97 %.  Systems examined below as of 01/03/17 General: NAD A&O x3 mood, affect normal  HEENT: Pupils equal, extraocular movements intact no nystagmus Respiratory: not short of breath at rest or with speaking Cardiovascular: No lower extremity edema, non tender Skin: Warm dry intact with no signs of infection or rash on extremities or on axial skeleton. Abdomen: Soft nontender, no masses Neuro: Cranial nerves  intact, neurovascularly intact in all extremities with 2+ DTRs and 2+ pulses. Lymph: No lymphadenopathy appreciated today  Gait normal with good balance and coordination.  MSK: Non tender with full range of motion and good stability and symmetric strength and tone of shoulders, elbows, wrist,  hips and ankles bilaterally.      Knee:Bilateral valgus  deformity noted. Large thigh to calf ratio.  Tender to palpation over medial and PF joint line. Less tenderness than previous exam ROM full in flexion and extension and lower leg rotation. instability with valgus force.  painful patellar compression. Patellar glide with moderate crepitus. Patellar and quadriceps tendons unremarkable. Hamstring and quadriceps strength is normal.   After informed written and verbal consent, patient was seated on exam table. Right knee was prepped with alcohol swab and utilizing anterolateral approach, patient's right knee space was injected with15 mg/2.5 mL of Orthovisc(sodium hyaluronate) in a prefilled syringe was injected easily into the knee through a 22-gauge needle..Patient tolerated the procedure well without immediate complications.  After informed written and verbal consent, patient was seated on exam table. Left knee was prepped with alcohol swab and utilizing anterolateral approach, patient's left knee space was injected with15 mg/2.5 mL of Orthovisc(sodium hyaluronate) in a prefilled syringe was injected easily into the knee through a 22-gauge needle..Patient tolerated the procedure well without immediate complications.     Impression and Recommendations:     This case required medical decision making of moderate complexity.

## 2017-01-03 NOTE — Assessment & Plan Note (Signed)
Continuing the viscous supplementation. 3 done with the right knee into down with the left knee. Continue the conservative therapy otherwise. Follow-up again in 1 week.

## 2017-01-03 NOTE — Patient Instructions (Signed)
We are getting there  One more to go on the right knee.  And 2 on the left  You know the drill at this point  See you again in 1 week!

## 2017-01-09 ENCOUNTER — Ambulatory Visit (HOSPITAL_COMMUNITY)
Admission: RE | Admit: 2017-01-09 | Discharge: 2017-01-09 | Disposition: A | Discharge status: Home / Self Care | Payer: 59 | Source: Ambulatory Visit | Attending: Rheumatology | Admitting: Rheumatology

## 2017-01-09 DIAGNOSIS — L4050 Arthropathic psoriasis, unspecified: Secondary | ICD-10-CM | POA: Insufficient documentation

## 2017-01-09 LAB — CBC WITH DIFFERENTIAL/PLATELET
Basophils Absolute: 0 10*3/uL (ref 0.0–0.1)
Basophils Relative: 1 %
Eosinophils Absolute: 0.2 10*3/uL (ref 0.0–0.7)
Eosinophils Relative: 3 %
HCT: 43.6 % (ref 36.0–46.0)
Hemoglobin: 14.1 g/dL (ref 12.0–15.0)
Lymphocytes Relative: 30 %
Lymphs Abs: 1.9 10*3/uL (ref 0.7–4.0)
MCH: 29.1 pg (ref 26.0–34.0)
MCHC: 32.3 g/dL (ref 30.0–36.0)
MCV: 89.9 fL (ref 78.0–100.0)
Monocytes Absolute: 0.5 10*3/uL (ref 0.1–1.0)
Monocytes Relative: 8 %
Neutro Abs: 3.7 10*3/uL (ref 1.7–7.7)
Neutrophils Relative %: 58 %
Platelets: 193 10*3/uL (ref 150–400)
RBC: 4.85 MIL/uL (ref 3.87–5.11)
RDW: 13.9 % (ref 11.5–15.5)
WBC: 6.3 10*3/uL (ref 4.0–10.5)

## 2017-01-09 LAB — COMPREHENSIVE METABOLIC PANEL
ALT: 62 U/L — ABNORMAL HIGH (ref 14–54)
AST: 37 U/L (ref 15–41)
Albumin: 3.7 g/dL (ref 3.5–5.0)
Alkaline Phosphatase: 70 U/L (ref 38–126)
Anion gap: 10 (ref 5–15)
BUN: 11 mg/dL (ref 6–20)
CO2: 24 mmol/L (ref 22–32)
Calcium: 9.2 mg/dL (ref 8.9–10.3)
Chloride: 104 mmol/L (ref 101–111)
Creatinine, Ser: 0.7 mg/dL (ref 0.44–1.00)
GFR calc Af Amer: >60 mL/min (ref >60)
GFR calc non Af Amer: >60 mL/min (ref >60)
Glucose, Bld: 157 mg/dL — ABNORMAL HIGH (ref 65–99)
Potassium: 4.3 mmol/L (ref 3.5–5.1)
Sodium: 138 mmol/L (ref 135–145)
Total Bilirubin: 0.6 mg/dL (ref 0.3–1.2)
Total Protein: 6.8 g/dL (ref 6.5–8.1)

## 2017-01-09 MED ORDER — ACETAMINOPHEN 325 MG PO TABS: 650.0000 mg | ORAL_TABLET | ORAL | Status: DC

## 2017-01-09 MED ORDER — DIPHENHYDRAMINE HCL 25 MG PO CAPS: 25.0000 mg | ORAL_CAPSULE | ORAL | Status: DC

## 2017-01-09 MED ORDER — SODIUM CHLORIDE 0.9 % IV SOLN
6.0000 mg/kg | Freq: Once | INTRAVENOUS | Status: AC
  Administered 2017-01-09: 700 mg via INTRAVENOUS
  Filled 2017-01-09: qty 70

## 2017-01-09 MED ORDER — SODIUM CHLORIDE 0.9 % IV SOLN
INTRAVENOUS | Status: DC
  Administered 2017-01-09: 09:00:00 via INTRAVENOUS

## 2017-01-09 NOTE — Progress Notes (Signed)
stable °

## 2017-01-10 DIAGNOSIS — F4323 Adjustment disorder with mixed anxiety and depressed mood: Secondary | ICD-10-CM | POA: Diagnosis not present

## 2017-01-11 ENCOUNTER — Encounter: Payer: Self-pay | Admitting: Family Medicine

## 2017-01-11 ENCOUNTER — Ambulatory Visit (INDEPENDENT_AMBULATORY_CARE_PROVIDER_SITE_OTHER): Payer: 59 | Admitting: Family Medicine

## 2017-01-11 DIAGNOSIS — M170 Bilateral primary osteoarthritis of knee: Secondary | ICD-10-CM | POA: Diagnosis not present

## 2017-01-11 NOTE — Assessment & Plan Note (Signed)
Patient is done with the series of viscous supplementation of the right knee. We are going to have one more injection the left hand. We discussed icing regimen and home exercise. Patient will come back and see me again in 4 weeks after these injections. We'll see her though next week for that the left

## 2017-01-11 NOTE — Progress Notes (Signed)
[name] [place], [place] [address] Phone: [phone] Subjective:     CC: Right knee pain f/u   [id number]  Tabitha [name] is a 51 y.o. female coming in with complaint of right knee pain. Patient was found to have patellofemoral arthritis as well as a synovitis of the knee. Here for fourth injection in the right knee as well as third injection in the left knee. Potential will conservative therapy and is here for viscous supplementation as stated above.      Past Medical History:  Diagnosis Date  . Arthritis   . De Quervain's disease (tenosynovitis)   . Dermatophytosis, scalp   . Diabetes mellitus   . Hypercholesteremia   . Obesity   . Polycystic ovarian disease   . Psoriatic arthritis ([place])   . Wears glasses    Past Surgical History:  Procedure Laterality Date  . CARPAL TUNNEL RELEASE    . COLONOSCOPY    . DE QUERVAIN'S RELEASE  2012   left  . KNEE ARTHROPLASTY    . KNEE ARTHROSCOPY Right 05/06/2013   Procedure: RIGHT KNEE ARTHROSCOPY ;  Surgeon: Hessie Dibble, MD;  Location: [facility];  Service: Orthopedics;  Laterality: Right;  partial lateral minisectomy and chondroplasty  . WISDOM TOOTH EXTRACTION    . WRIST SURGERY  2001   carpel tunnel -rt   Social History   Social History  . Marital status: Divorced    Spouse name: N/A  . Number of children: N/A  . Years of education: N/A   Occupational History  . 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 Smoking status: Former Smoker    Packs/day: 1.30    Years: 12.00    Types: Cigarettes    Start date: 06/12/1984    Quit date: 03/18/1995  . Smokeless tobacco: Never Used     Comment: Not interested in returning to smoking.   . Alcohol use Yes     Comment: 2 per month  . Drug use: No  . Sexual activity: Yes    Partners: Male     Comment: married   Other Topics Concern  . None   Social History Narrative  . None    Allergies  Allergen Reactions  . Chocolate Anaphylaxis  . Food     chocolate  . Statins     Intolerant  lft elevation  . Latex Rash   Family History  Problem Relation Age of Onset  . Diabetes Father   . Hypertension Father   . Stroke Father   . Kidney disease Father   . Cancer Mother 65       breast ca died at age 69  . Hypertension Unknown   . Diabetes type II Unknown   . Obesity Unknown   . Obesity Brother   . Hypertension Brother   . Heart disease Maternal Grandmother   . Alzheimer's disease Maternal Grandmother   . Diabetes Paternal Grandmother   . Heart disease Paternal Grandfather     Past medical history, social, surgical and family history all reviewed in electronic medical record.  No pertanent information unless stated regarding to the chief complaint.    Review of Systems: No headache, visual changes, nausea, vomiting, diarrhea, constipation, dizziness, abdominal pain, skin rash, fevers, chills, night sweats, weight loss, swollen lymph nodes, body aches, joint swelling, muscle aches, chest pain, shortness of breath, mood changes.  Positive  muscle aches   Objective  Blood pressure 134/84, pulse 84, height 5\' 3"  (1.6 m), SpO2 96 %.  Systems examined below as of 01/11/17 General: NAD A&O x3 mood, affect normal  HEENT: Pupils equal, extraocular movements intact no nystagmus Respiratory: not short of breath at rest or with speaking Cardiovascular: No lower extremity edema, non tender Skin: Warm dry intact with no signs of infection or rash on extremities or on axial skeleton. Abdomen: Soft nontender, no masses Neuro: Cranial nerves  intact, neurovascularly intact in all extremities with 2+ DTRs and 2+ pulses. Lymph: No lymphadenopathy appreciated today  Gait normal with good balance and coordination.  MSK: Non tender with full range of motion and good stability and symmetric strength and tone of shoulders, elbows, wrist,  hips and ankles bilaterally.       Knee:Bilateral valgus deformity noted. Large thigh to calf ratio.  Tender to palpation over medial and PF joint line. Less tenderness than previous exam ROM full in flexion and extension and lower leg rotation. instability with valgus force.  painful patellar compression. Patellar glide with moderate crepitus. Patellar and quadriceps tendons unremarkable. Hamstring and quadriceps strength is normal.    After informed written and verbal consent, patient was seated on exam table. Right knee was prepped with alcohol swab and utilizing anterolateral approach, patient's right knee space was injected with15 mg/2.5 mL of Orthovisc(sodium hyaluronate) in a prefilled syringe was injected easily into the knee through a 22-gauge needle..Patient tolerated the procedure well without immediate complications.  After informed written and verbal consent, patient was seated on exam table. Left knee was prepped with alcohol swab and utilizing anterolateral approach, patient's left knee space was injected with15 mg/2.5 mL of Orthovisc(sodium hyaluronate) in a prefilled syringe was injected easily into the knee through a 22-gauge needle..Patient tolerated the procedure well without immediate complications.    Impression and Recommendations:     This case required medical decision making of moderate complexity.

## 2017-01-11 NOTE — Patient Instructions (Signed)
Good to see you  You are done!!! Keep it up  See me again in 4 weeks!!! Or when needed.

## 2017-01-12 LAB — QUANTIFERON IN TUBE
QFT TB AG MINUS NIL VALUE: 0.01 IU/mL
QUANTIFERON MITOGEN VALUE: 6.32 IU/mL
QUANTIFERON TB AG VALUE: 0.07 IU/mL
QUANTIFERON TB GOLD: NEGATIVE
Quantiferon Nil Value: 0.06 IU/mL

## 2017-01-12 LAB — QUANTIFERON TB GOLD ASSAY (BLOOD)

## 2017-01-12 NOTE — Progress Notes (Signed)
Labs are stable.

## 2017-01-18 ENCOUNTER — Encounter: Payer: Self-pay | Admitting: Family Medicine

## 2017-01-18 ENCOUNTER — Ambulatory Visit (INDEPENDENT_AMBULATORY_CARE_PROVIDER_SITE_OTHER): Payer: 59 | Admitting: Family Medicine

## 2017-01-18 DIAGNOSIS — M170 Bilateral primary osteoarthritis of knee: Secondary | ICD-10-CM

## 2017-01-18 NOTE — Progress Notes (Signed)
[name] [place], [place] [address] Phone: [phone] Subjective:     GD:JMEQ knee follow-up  [id number]  Tabitha [name] is a 51 y.o. female coming in with complaint of left knee arthritis. Finished viscous supplementation on the right knee last week. Last injection for the left knee. Patient states that it has been feeling better. States that approximately 20-40% better.      Past Medical History:  Diagnosis Date  . Arthritis   . De Quervain's disease (tenosynovitis)   . Dermatophytosis, scalp   . Diabetes mellitus   . Hypercholesteremia   . Obesity   . Polycystic ovarian disease   . Psoriatic arthritis ([place])   . Wears glasses    Past Surgical History:  Procedure Laterality Date  . CARPAL TUNNEL RELEASE    . COLONOSCOPY    . DE QUERVAIN'S RELEASE  2012   left  . KNEE ARTHROPLASTY    . KNEE ARTHROSCOPY Right 05/06/2013   Procedure: RIGHT KNEE ARTHROSCOPY ;  Surgeon: Hessie Dibble, MD;  Location: [facility];  Service: Orthopedics;  Laterality: Right;  partial lateral minisectomy and chondroplasty  . WISDOM TOOTH EXTRACTION    . WRIST SURGERY  2001   carpel tunnel -rt   Social History   Social History  . Marital status: Divorced    Spouse name: N/A  . Number of children: N/A  . Years of education: N/A   Occupational History  . nurse     at [employer]   Social History Main Topics  . Smoking status: Former Smoker    Packs/day: 1.30    Years: 12.00    Types: Cigarettes    Start date: 06/12/1984    Quit date: 03/18/1995  . Smokeless tobacco: Never Used     Comment: Not interested in returning to smoking.   . Alcohol use Yes     Comment: 2 per month  . Drug use: No  . Sexual activity: Yes    Partners: Male     Comment: married   Other Topics Concern  . None   Social History Narrative  . None   Allergies  Allergen Reactions  . Chocolate Anaphylaxis  . Food     chocolate   . Statins     Intolerant  lft elevation  . Latex Rash   Family History  Problem Relation Age of Onset  . Diabetes Father   . Hypertension Father   . Stroke Father   . Kidney disease Father   . Cancer Mother 69       breast ca died at age 76  . Hypertension Unknown   . Diabetes type II Unknown   . Obesity Unknown   . Obesity Brother   . Hypertension Brother   . Heart disease Maternal Grandmother   . Alzheimer's disease Maternal Grandmother   . Diabetes Paternal Grandmother   . Heart disease Paternal Grandfather     Past medical history, social, surgical and family history all reviewed in electronic medical record.  No pertanent information unless stated regarding to the chief complaint.    Review of Systems: No headache, visual changes, nausea, vomiting, diarrhea, constipation, dizziness, abdominal pain, skin rash, fevers, chills, night sweats, weight loss, swollen lymph nodes, body aches, joint swelling, muscle aches, chest pain, shortness of breath, mood changes.     Objective  Blood pressure 120/74, pulse 77, height 5\' 3"  (1.6 m), SpO2 97 %.  Systems examined  below as of 01/18/17 General: NAD A&O x3 mood, affect normal  HEENT: Pupils equal, extraocular movements intact no nystagmus Respiratory: not short of breath at rest or with speaking Cardiovascular: No lower extremity edema, non tender Skin: Warm dry intact with no signs of infection or rash on extremities or on axial skeleton. Abdomen: Soft nontender, no masses Neuro: Cranial nerves  intact, neurovascularly intact in all extremities with 2+ DTRs and 2+ pulses. Lymph: No lymphadenopathy appreciated today  Gait normal with good balance and coordination.  MSK: Non tender with full range of motion and good stability and symmetric strength and tone of shoulders, elbows, wrist,  hips and ankles bilaterally.      Knee:Bilateral valgus deformity noted. Large thigh to calf ratio.  Only tender to palpation on the left  knee lateral aspect ROM full in flexion and extension and lower leg rotation. instability with valgus force.  painful patellar compression. Patellar glide with moderate crepitus. Patellar and quadriceps tendons unremarkable. Hamstring and quadriceps strength is normal.   After informed written and verbal consent, patient was seated on exam table. Left knee was prepped with alcohol swab and utilizing anterolateral approach, patient's left knee space was injected with15 mg/2.5 mL of Orthovisc(sodium hyaluronate) in a prefilled syringe was injected easily into the knee through a 22-gauge needle..Patient tolerated the procedure well without immediate complications.    Impression and Recommendations:     This case required medical decision making of moderate complexity.

## 2017-01-18 NOTE — Assessment & Plan Note (Signed)
Finished viscous supplementation today on the left knee. Discussed icing regimen. Continuing to be active. Follow-up with me in 4 weeks

## 2017-01-24 DIAGNOSIS — F4323 Adjustment disorder with mixed anxiety and depressed mood: Secondary | ICD-10-CM | POA: Diagnosis not present

## 2017-02-01 MED FILL — VICTOZA 18 MG/3 ML INJECT P: 18 | 30 days supply | Qty: 9 | Fill #2

## 2017-02-02 MED FILL — UNIFINE PENTIPS 32GX5/32: 32G X 4 MM | 90 days supply | Qty: 100 | Fill #0

## 2017-02-02 MED FILL — UNIFINE PENTIPS 32GX5/32": 32G X 4 MM | 90 days supply | Qty: 100 | Fill #0

## 2017-02-05 DIAGNOSIS — F4323 Adjustment disorder with mixed anxiety and depressed mood: Secondary | ICD-10-CM | POA: Diagnosis not present

## 2017-02-05 DIAGNOSIS — M545 Low back pain: Secondary | ICD-10-CM | POA: Diagnosis not present

## 2017-02-05 DIAGNOSIS — M9904 Segmental and somatic dysfunction of sacral region: Secondary | ICD-10-CM | POA: Diagnosis not present

## 2017-02-05 DIAGNOSIS — M5416 Radiculopathy, lumbar region: Secondary | ICD-10-CM | POA: Diagnosis not present

## 2017-02-05 DIAGNOSIS — M9903 Segmental and somatic dysfunction of lumbar region: Secondary | ICD-10-CM | POA: Diagnosis not present

## 2017-02-16 ENCOUNTER — Other Ambulatory Visit (HOSPITAL_COMMUNITY): Payer: Self-pay | Admitting: *Deleted

## 2017-02-16 ENCOUNTER — Telehealth: Payer: Self-pay | Admitting: Rheumatology

## 2017-02-16 ENCOUNTER — Other Ambulatory Visit: Payer: Self-pay | Admitting: Radiology

## 2017-02-16 DIAGNOSIS — L4050 Arthropathic psoriasis, unspecified: Secondary | ICD-10-CM

## 2017-02-16 NOTE — Progress Notes (Signed)
Infusion orders updated today including CBC CMP Tylenol and Benadryl appointments are up to date and a follow up appointment is scheduled/ TB gold is due on July 2019

## 2017-02-16 NOTE — Telephone Encounter (Signed)
[facility] needs Remicade orders for patients Monday appt.

## 2017-02-19 ENCOUNTER — Other Ambulatory Visit: Payer: Self-pay | Admitting: Radiology

## 2017-02-19 ENCOUNTER — Ambulatory Visit (HOSPITAL_COMMUNITY)
Admission: RE | Admit: 2017-02-19 | Discharge: 2017-02-19 | Disposition: A | Discharge status: Home / Self Care | Payer: 59 | Source: Ambulatory Visit | Attending: Rheumatology | Admitting: Rheumatology

## 2017-02-19 DIAGNOSIS — L4050 Arthropathic psoriasis, unspecified: Secondary | ICD-10-CM | POA: Insufficient documentation

## 2017-02-19 MED ORDER — DIPHENHYDRAMINE HCL 25 MG PO CAPS: 25.0000 mg | ORAL_CAPSULE | ORAL | Status: DC

## 2017-02-19 MED ORDER — INFLIXIMAB 100 MG IV SOLR
6.0000 mg/kg | INTRAVENOUS | Status: DC
  Administered 2017-02-19: 700 mg via INTRAVENOUS
  Filled 2017-02-19: qty 70

## 2017-02-19 MED ORDER — ACETAMINOPHEN 325 MG PO TABS
650.0000 mg | ORAL_TABLET | ORAL | Status: DC
Start: 2017-02-19 — End: 2017-02-20

## 2017-02-19 NOTE — Progress Notes (Signed)
Ordered infusion orders AGAIN sent message to Roena Malady to see what happened to the orders I placed on 02/16/17.

## 2017-03-06 MED FILL — VICTOZA 18 MG/3 ML INJECT P: 18 | 30 days supply | Qty: 9 | Fill #3

## 2017-03-07 DIAGNOSIS — F4323 Adjustment disorder with mixed anxiety and depressed mood: Secondary | ICD-10-CM | POA: Diagnosis not present

## 2017-03-19 DIAGNOSIS — F4323 Adjustment disorder with mixed anxiety and depressed mood: Secondary | ICD-10-CM | POA: Diagnosis not present

## 2017-03-21 ENCOUNTER — Other Ambulatory Visit: Payer: Self-pay | Admitting: Rheumatology

## 2017-03-21 NOTE — Progress Notes (Signed)
Infusion orders are current for patient CBC CMP Tylenol Benadryl appointments are up to date and follow up appointment  is scheduled TB gold not due yet, due on July 2019

## 2017-04-03 ENCOUNTER — Other Ambulatory Visit: Payer: 59

## 2017-04-03 ENCOUNTER — Encounter: Payer: Self-pay | Admitting: Internal Medicine

## 2017-04-03 ENCOUNTER — Encounter (HOSPITAL_COMMUNITY): Payer: 59

## 2017-04-03 ENCOUNTER — Ambulatory Visit (INDEPENDENT_AMBULATORY_CARE_PROVIDER_SITE_OTHER): Payer: 59 | Admitting: Internal Medicine

## 2017-04-03 VITALS — BP 122/70 | HR 85 | Temp 98.7°F | Ht 63.0 in | Wt 256.0 lb

## 2017-04-03 DIAGNOSIS — E785 Hyperlipidemia, unspecified: Secondary | ICD-10-CM | POA: Diagnosis not present

## 2017-04-03 DIAGNOSIS — Z0000 Encounter for general adult medical examination without abnormal findings: Secondary | ICD-10-CM

## 2017-04-03 DIAGNOSIS — E0865 Diabetes mellitus due to underlying condition with hyperglycemia: Secondary | ICD-10-CM

## 2017-04-03 DIAGNOSIS — E1169 Type 2 diabetes mellitus with other specified complication: Secondary | ICD-10-CM | POA: Diagnosis not present

## 2017-04-03 MED FILL — SYNJARDY 5-1,000 MG TABLET: 5-1000 | 90 days supply | Qty: 180 | Fill #0

## 2017-04-03 MED FILL — VICTOZA 18 MG/3 ML INJECT P: 18 | 30 days supply | Qty: 9 | Fill #4

## 2017-04-03 NOTE — Patient Instructions (Signed)
We are checking the labs today.   Health Maintenance, Female Adopting a healthy lifestyle and getting preventive care can go a long way to promote health and wellness. Talk with your health care provider about what schedule of regular examinations is right for you. This is a good chance for you to check in with your provider about disease prevention and staying healthy. In between checkups, there are plenty of things you can do on your own. Experts have done a lot of research about which lifestyle changes and preventive measures are most likely to keep you healthy. Ask your health care provider for more information. Weight and diet Eat a healthy diet  Be sure to include plenty of vegetables, fruits, low-fat dairy products, and lean protein.  Do not eat a lot of foods high in solid fats, added sugars, or salt.  Get regular exercise. This is one of the most important things you can do for your health. ? Most adults should exercise for at least 150 minutes each week. The exercise should increase your heart rate and make you sweat (moderate-intensity exercise). ? Most adults should also do strengthening exercises at least twice a week. This is in addition to the moderate-intensity exercise.  Maintain a healthy weight  Body mass index (BMI) is a measurement that can be used to identify possible weight problems. It estimates body fat based on height and weight. Your health care provider can help determine your BMI and help you achieve or maintain a healthy weight.  For females 20 years of age and older: ? A BMI below 18.5 is considered underweight. ? A BMI of 18.5 to 24.9 is normal. ? A BMI of 25 to 29.9 is considered overweight. ? A BMI of 30 and above is considered obese.  Watch levels of cholesterol and blood lipids  You should start having your blood tested for lipids and cholesterol at 52 years of age, then have this test every 5 years.  You may need to have your cholesterol levels  checked more often if: ? Your lipid or cholesterol levels are high. ? You are older than 52 years of age. ? You are at high risk for heart disease.  Cancer screening Lung Cancer  Lung cancer screening is recommended for adults 55-80 years old who are at high risk for lung cancer because of a history of smoking.  A yearly low-dose CT scan of the lungs is recommended for people who: ? Currently smoke. ? Have quit within the past 15 years. ? Have at least a 30-pack-year history of smoking. A pack year is smoking an average of one pack of cigarettes a day for 1 year.  Yearly screening should continue until it has been 15 years since you quit.  Yearly screening should stop if you develop a health problem that would prevent you from having lung cancer treatment.  Breast Cancer  Practice breast self-awareness. This means understanding how your breasts normally appear and feel.  It also means doing regular breast self-exams. Let your health care provider know about any changes, no matter how small.  If you are in your 20s or 30s, you should have a clinical breast exam (CBE) by a health care provider every 1-3 years as part of a regular health exam.  If you are 40 or older, have a CBE every year. Also consider having a breast X-ray (mammogram) every year.  If you have a family history of breast cancer, talk to your health care provider about genetic   If you are at high risk for breast cancer, talk to your health care provider about having an MRI and a mammogram every year.  Breast cancer gene (BRCA) assessment is recommended for women who have family members with BRCA-related cancers. BRCA-related cancers include: ? Breast. ? Ovarian. ? Tubal. ? Peritoneal cancers.  Results of the assessment will determine the need for genetic counseling and BRCA1 and BRCA2 testing.  Cervical Cancer Your health care provider may recommend that you be screened regularly for cancer of the  pelvic organs (ovaries, uterus, and vagina). This screening involves a pelvic examination, including checking for microscopic changes to the surface of your cervix (Pap test). You may be encouraged to have this screening done every 3 years, beginning at age 71.  For women ages 82-65, health care providers may recommend pelvic exams and Pap testing every 3 years, or they may recommend the Pap and pelvic exam, combined with testing for human papilloma virus (HPV), every 5 years. Some types of HPV increase your risk of cervical cancer. Testing for HPV may also be done on women of any age with unclear Pap test results.  Other health care providers may not recommend any screening for nonpregnant women who are considered low risk for pelvic cancer and who do not have symptoms. Ask your health care provider if a screening pelvic exam is right for you.  If you have had past treatment for cervical cancer or a condition that could lead to cancer, you need Pap tests and screening for cancer for at least 20 years after your treatment. If Pap tests have been discontinued, your risk factors (such as having a new sexual partner) need to be reassessed to determine if screening should resume. Some women have medical problems that increase the chance of getting cervical cancer. In these cases, your health care provider may recommend more frequent screening and Pap tests.  Colorectal Cancer  This type of cancer can be detected and often prevented.  Routine colorectal cancer screening usually begins at 52 years of age and continues through 52 years of age.  Your health care provider may recommend screening at an earlier age if you have risk factors for colon cancer.  Your health care provider may also recommend using home test kits to check for hidden blood in the stool.  A small camera at the end of a tube can be used to examine your colon directly (sigmoidoscopy or colonoscopy). This is done to check for the  earliest forms of colorectal cancer.  Routine screening usually begins at age 15.  Direct examination of the colon should be repeated every 5-10 years through 52 years of age. However, you may need to be screened more often if early forms of precancerous polyps or small growths are found.  Skin Cancer  Check your skin from head to toe regularly.  Tell your health care provider about any new moles or changes in moles, especially if there is a change in a mole's shape or color.  Also tell your health care provider if you have a mole that is larger than the size of a pencil eraser.  Always use sunscreen. Apply sunscreen liberally and repeatedly throughout the day.  Protect yourself by wearing long sleeves, pants, a wide-brimmed hat, and sunglasses whenever you are outside.  Heart disease, diabetes, and high blood pressure  High blood pressure causes heart disease and increases the risk of stroke. High blood pressure is more likely to develop in: ? People who have blood  pressure in the high end of the normal range (130-139/85-89 mm Hg). ? People who are overweight or obese. ? People who are African American.  If you are 56-87 years of age, have your blood pressure checked every 3-5 years. If you are 74 years of age or older, have your blood pressure checked every year. You should have your blood pressure measured twice-once when you are at a hospital or clinic, and once when you are not at a hospital or clinic. Record the average of the two measurements. To check your blood pressure when you are not at a hospital or clinic, you can use: ? An automated blood pressure machine at a pharmacy. ? A home blood pressure monitor.  If you are between 69 years and 79 years old, ask your health care provider if you should take aspirin to prevent strokes.  Have regular diabetes screenings. This involves taking a blood sample to check your fasting blood sugar level. ? If you are at a normal weight and  have a low risk for diabetes, have this test once every three years after 52 years of age. ? If you are overweight and have a high risk for diabetes, consider being tested at a younger age or more often. Preventing infection Hepatitis B  If you have a higher risk for hepatitis B, you should be screened for this virus. You are considered at high risk for hepatitis B if: ? You were born in a country where hepatitis B is common. Ask your health care provider which countries are considered high risk. ? Your parents were born in a high-risk country, and you have not been immunized against hepatitis B (hepatitis B vaccine). ? You have HIV or AIDS. ? You use needles to inject street drugs. ? You live with someone who has hepatitis B. ? You have had sex with someone who has hepatitis B. ? You get hemodialysis treatment. ? You take certain medicines for conditions, including cancer, organ transplantation, and autoimmune conditions.  Hepatitis C  Blood testing is recommended for: ? Everyone born from [birth year] through 1966. ? Anyone with known risk factors for hepatitis C.  Sexually transmitted infections (STIs)  You should be screened for sexually transmitted infections (STIs) including gonorrhea and chlamydia if: ? You are sexually active and are younger than 52 years of age. ? You are older than 52 years of age and your health care provider tells you that you are at risk for this type of infection. ? Your sexual activity has changed since you were last screened and you are at an increased risk for chlamydia or gonorrhea. Ask your health care provider if you are at risk.  If you do not have HIV, but are at risk, it may be recommended that you take a prescription medicine daily to prevent HIV infection. This is called pre-exposure prophylaxis (PrEP). You are considered at risk if: ? You are sexually active and do not regularly use condoms or know the HIV status of your partner(s). ? You take drugs by  injection. ? You are sexually active with a partner who has HIV.  Talk with your health care provider about whether you are at high risk of being infected with HIV. If you choose to begin PrEP, you should first be tested for HIV. You should then be tested every 3 months for as long as you are taking PrEP. Pregnancy  If you are premenopausal and you may become pregnant, ask your health care provider about preconception  counseling.  If you may become pregnant, take 400 to 800 micrograms (mcg) of folic acid every day.  If you want to prevent pregnancy, talk to your health care provider about birth control (contraception). Osteoporosis and menopause  Osteoporosis is a disease in which the bones lose minerals and strength with aging. This can result in serious bone fractures. Your risk for osteoporosis can be identified using a bone density scan.  If you are 39 years of age or older, or if you are at risk for osteoporosis and fractures, ask your health care provider if you should be screened.  Ask your health care provider whether you should take a calcium or vitamin D supplement to lower your risk for osteoporosis.  Menopause may have certain physical symptoms and risks.  Hormone replacement therapy may reduce some of these symptoms and risks. Talk to your health care provider about whether hormone replacement therapy is right for you. Follow these instructions at home:  Schedule regular health, dental, and eye exams.  Stay current with your immunizations.  Do not use any tobacco products including cigarettes, chewing tobacco, or electronic cigarettes.  If you are pregnant, do not drink alcohol.  If you are breastfeeding, limit how much and how often you drink alcohol.  Limit alcohol intake to no more than 1 drink per day for nonpregnant women. One drink equals 12 ounces of beer, 5 ounces of wine, or 1 ounces of hard liquor.  Do not use street drugs.  Do not share needles.  Ask  your health care provider for help if you need support or information about quitting drugs.  Tell your health care provider if you often feel depressed.  Tell your health care provider if you have ever been abused or do not feel safe at home. This information is not intended to replace advice given to you by your health care provider. Make sure you discuss any questions you have with your health care provider. Document Released: 12/12/2010 Document Revised: 11/04/2015 Document Reviewed: 03/02/2015 Elsevier Interactive Patient Education  [employer].

## 2017-04-03 NOTE — Progress Notes (Signed)
[facility] eye, last November 2017

## 2017-04-05 ENCOUNTER — Encounter (HOSPITAL_COMMUNITY): Payer: Self-pay

## 2017-04-05 ENCOUNTER — Ambulatory Visit (HOSPITAL_COMMUNITY)
Admission: RE | Admit: 2017-04-05 | Discharge: 2017-04-05 | Disposition: A | Discharge status: Home / Self Care | Payer: 59 | Source: Ambulatory Visit | Attending: Rheumatology | Admitting: Rheumatology

## 2017-04-05 DIAGNOSIS — Z0000 Encounter for general adult medical examination without abnormal findings: Secondary | ICD-10-CM | POA: Insufficient documentation

## 2017-04-05 DIAGNOSIS — L4050 Arthropathic psoriasis, unspecified: Secondary | ICD-10-CM | POA: Diagnosis not present

## 2017-04-05 LAB — COMPREHENSIVE METABOLIC PANEL
ALT: 60 U/L — ABNORMAL HIGH (ref 14–54)
AST: 38 U/L (ref 15–41)
Albumin: 3.7 g/dL (ref 3.5–5.0)
Alkaline Phosphatase: 71 U/L (ref 38–126)
Anion gap: 10 (ref 5–15)
BUN: 19 mg/dL (ref 6–20)
CO2: 23 mmol/L (ref 22–32)
Calcium: 9.2 mg/dL (ref 8.9–10.3)
Chloride: 105 mmol/L (ref 101–111)
Creatinine, Ser: 0.62 mg/dL (ref 0.44–1.00)
GFR calc Af Amer: >60 mL/min (ref >60)
GFR calc non Af Amer: >60 mL/min (ref >60)
Glucose, Bld: 150 mg/dL — ABNORMAL HIGH (ref 65–99)
Potassium: 4.3 mmol/L (ref 3.5–5.1)
Sodium: 138 mmol/L (ref 135–145)
Total Bilirubin: 0.6 mg/dL (ref 0.3–1.2)
Total Protein: 6.6 g/dL (ref 6.5–8.1)

## 2017-04-05 LAB — CBC
HCT: 42.8 % (ref 36.0–46.0)
Hemoglobin: 14 g/dL (ref 12.0–15.0)
MCH: 29.9 pg (ref 26.0–34.0)
MCHC: 32.7 g/dL (ref 30.0–36.0)
MCV: 91.5 fL (ref 78.0–100.0)
Platelets: 193 10*3/uL (ref 150–400)
RBC: 4.68 MIL/uL (ref 3.87–5.11)
RDW: 14 % (ref 11.5–15.5)
WBC: 7.7 10*3/uL (ref 4.0–10.5)

## 2017-04-05 MED ORDER — ACETAMINOPHEN 325 MG PO TABS: 650.0000 mg | ORAL_TABLET | ORAL | Status: DC

## 2017-04-05 MED ORDER — SODIUM CHLORIDE 0.9 % IV SOLN
6.0000 mg/kg | INTRAVENOUS | Status: DC
  Administered 2017-04-05: 700 mg via INTRAVENOUS
  Filled 2017-04-05: qty 70

## 2017-04-05 MED ORDER — DIPHENHYDRAMINE HCL 25 MG PO CAPS: 25.0000 mg | ORAL_CAPSULE | ORAL | Status: DC

## 2017-04-05 NOTE — Progress Notes (Signed)
   Subjective:    Patient ID: [name], female    DOB: 09/30/1965, 51 y.o.   MRN: [id number]  HPI The patient is a 51 YO female coming in for physical.  [facility], last November 2017  PMH, [facility], social history reviewed and updated.   Review of Systems  Constitutional: Negative.   HENT: Negative.   Eyes: Negative.   Respiratory: Negative for cough, chest tightness and shortness of breath.   Cardiovascular: Negative for chest pain, palpitations and leg swelling.  Gastrointestinal: Negative for abdominal distention, abdominal pain, constipation, diarrhea, nausea and vomiting.  Musculoskeletal: Positive for arthralgias.  Skin: Negative.   Neurological: Negative.   Psychiatric/Behavioral: Negative.       Objective:   Physical Exam  Constitutional: She is oriented to person, place, and time. She appears well-developed and well-nourished.  HENT:  Head: Normocephalic and atraumatic.  Eyes: EOM are normal.  Neck: Normal range of motion.  Cardiovascular: Normal rate and regular rhythm.   Pulmonary/Chest: Effort normal and breath sounds normal. No respiratory distress. She has no wheezes. She has no rales.  Abdominal: Soft. Bowel sounds are normal. She exhibits no distension. There is no tenderness. There is no rebound.  Musculoskeletal: She exhibits no edema.  Neurological: She is alert and oriented to person, place, and time. Coordination normal.  Skin: Skin is warm and dry.  Psychiatric: She has a normal mood and affect.   Vitals:   04/03/17 1505  BP: 122/70  Pulse: 85  Temp: 98.7 F (37.1 C)  TempSrc: Oral  SpO2: 100%  Weight: 256 lb (116.1 kg)  Height: 5\' 3"  (1.6 m)      Assessment & Plan:

## 2017-04-05 NOTE — Progress Notes (Signed)
Labs are stable.

## 2017-04-05 NOTE — Assessment & Plan Note (Signed)
Goal LDL <100, prior lft elevations on statins. Checking lipid panel and adjust as needed.

## 2017-04-05 NOTE — Assessment & Plan Note (Signed)
Flu shot done, declines pneumonia, tetanus up to date. Colonoscopy done. Mammogram up to date. Counseled on sun safety, mole surveillance, and dangers of distracted driving. Given screening recommendations.

## 2017-04-05 NOTE — Assessment & Plan Note (Signed)
Due to some prednisone with psoriatic arthritis. She is taking victoza still and foot exam done. Checking HgA1c, lipid panel and CMP and adjust as needed. Not complicated.

## 2017-04-06 ENCOUNTER — Other Ambulatory Visit (INDEPENDENT_AMBULATORY_CARE_PROVIDER_SITE_OTHER): Payer: 59

## 2017-04-06 DIAGNOSIS — Z0000 Encounter for general adult medical examination without abnormal findings: Secondary | ICD-10-CM | POA: Diagnosis not present

## 2017-04-06 LAB — LIPID PANEL
Cholesterol: 202 mg/dL — ABNORMAL HIGH (ref 0–200)
HDL: 65.1 mg/dL (ref >39.00)
LDL Cholesterol: 109 mg/dL — ABNORMAL HIGH (ref 0–99)
NonHDL: 136.54
Total CHOL/HDL Ratio: 3
Triglycerides: 136 mg/dL (ref 0.0–149.0)
VLDL: 27.2 mg/dL (ref 0.0–40.0)

## 2017-04-06 LAB — HEMOGLOBIN A1C: Hgb A1c MFr Bld: 6.4 % (ref 4.6–6.5)

## 2017-04-07 LAB — HIV ANTIBODY (ROUTINE TESTING W REFLEX): HIV 1&2 Ab, 4th Generation: NONREACTIVE

## 2017-04-10 DIAGNOSIS — F4323 Adjustment disorder with mixed anxiety and depressed mood: Secondary | ICD-10-CM | POA: Diagnosis not present

## 2017-04-23 DIAGNOSIS — F4323 Adjustment disorder with mixed anxiety and depressed mood: Secondary | ICD-10-CM | POA: Diagnosis not present

## 2017-05-01 ENCOUNTER — Other Ambulatory Visit: Payer: Self-pay | Admitting: Rheumatology

## 2017-05-01 DIAGNOSIS — L4050 Arthropathic psoriasis, unspecified: Secondary | ICD-10-CM

## 2017-05-01 MED FILL — METHOTREXATE 25 MG/ML VIAL: 50 | 84 days supply | Qty: 6 | Fill #1

## 2017-05-01 MED FILL — UNIFINE PENTIPS 32GX5/32: 32G X 4 MM | 90 days supply | Qty: 100 | Fill #1

## 2017-05-01 MED FILL — UNIFINE PENTIPS 32GX5/32": 32G X 4 MM | 90 days supply | Qty: 100 | Fill #1

## 2017-05-01 MED FILL — FOLIC ACID 1 MG TABLET: 1 | 90 days supply | Qty: 180 | Fill #2

## 2017-05-01 NOTE — Telephone Encounter (Signed)
Last Visit: 12/29/16 Next Visit: 05/31/17 Labs: 04/05/17 Stable  Okay to refill per Dr. Estanislado Pandy

## 2017-05-02 MED FILL — VICTOZA 18 MG/3 ML INJECT P: 18 | 30 days supply | Qty: 9 | Fill #0

## 2017-05-02 MED FILL — ACCU-CHEK GUIDE TEST STRIP: 90 days supply | Qty: 200 | Fill #2

## 2017-05-10 DIAGNOSIS — F4323 Adjustment disorder with mixed anxiety and depressed mood: Secondary | ICD-10-CM | POA: Diagnosis not present

## 2017-05-15 ENCOUNTER — Other Ambulatory Visit: Payer: Self-pay

## 2017-05-15 NOTE — Patient Outreach (Signed)
[facility]) Care Management  05/15/2017  Tabitha [name] Jun 13, 1965 [id number]   Case closed in [place].  Member is being followed by the [employer] program Member enrolled in an external program. Peter Garter RN, [facility] Care Management Coordinator-[facility] [place] [facility] [phone]

## 2017-05-17 ENCOUNTER — Ambulatory Visit (HOSPITAL_COMMUNITY)
Admission: RE | Admit: 2017-05-17 | Discharge: 2017-05-17 | Disposition: A | Discharge status: Home / Self Care | Payer: 59 | Source: Ambulatory Visit | Attending: Rheumatology | Admitting: Rheumatology

## 2017-05-17 ENCOUNTER — Telehealth: Payer: Self-pay

## 2017-05-17 ENCOUNTER — Other Ambulatory Visit: Payer: Self-pay | Admitting: *Deleted

## 2017-05-17 DIAGNOSIS — L4050 Arthropathic psoriasis, unspecified: Secondary | ICD-10-CM | POA: Insufficient documentation

## 2017-05-17 MED ORDER — TRAMADOL HCL 50 MG PO TABS: 50.0000 mg | ORAL_TABLET | Freq: Two times a day (BID) | ORAL | 0 refills | Status: DC

## 2017-05-17 MED ORDER — DIPHENHYDRAMINE HCL 25 MG PO CAPS: 25.0000 mg | ORAL_CAPSULE | ORAL | Status: DC

## 2017-05-17 MED ORDER — SODIUM CHLORIDE 0.9 % IV SOLN
6.0000 mg/kg | INTRAVENOUS | Status: AC
Start: 2017-05-17 — End: 2017-05-17
  Administered 2017-05-17: 700 mg via INTRAVENOUS
  Filled 2017-05-17: qty 70

## 2017-05-17 MED ORDER — ACETAMINOPHEN 325 MG PO TABS: 650.0000 mg | ORAL_TABLET | ORAL | Status: DC

## 2017-05-17 MED ORDER — SODIUM CHLORIDE 0.9 % IV SOLN
INTRAVENOUS | Status: DC
  Administered 2017-05-17: 09:00:00 via INTRAVENOUS

## 2017-05-17 NOTE — Telephone Encounter (Signed)
Last Visit: 12/29/16 Next Visit: 05/31/17 UDS: 02/08/16 Narc agreement: 02/15/16  Okay to refill 30 day supply of Tramadol?

## 2017-05-17 NOTE — Telephone Encounter (Signed)
ok 

## 2017-05-17 NOTE — Telephone Encounter (Signed)
Called the patient to verify benefits for 2019. Pt currently received infusions that may require a pre-certification. Patient states that she will continue to have medical benefits through [facility]). She will have the "focus" plan and she believes that nothing should change.Will submit a benefit reverification through [place] and update once we have a response.   Phone: [phone] Fax: [phone]  Demetrios Loll, CPhT 2:05 PM

## 2017-05-21 NOTE — Progress Notes (Addendum)
Office Visit Note  Patient: Tabitha [name]             Date of Birth: Jun 19, 1965           MRN: [id number]             PCP: Hoyt Koch, MD Referring: Hoyt Koch, * Visit Date: 05/31/2017 Occupation: @GUAROCC @    Subjective:  Pain in neck, knees, and ankles    History of Present Illness: Tabitha [name] is a 51 y.o. female with history of psoriatic arthritis and osteoarthritis.  Patient is currently on Remicade every 6 weeks and MTX 0.5 ml weekly.  Patient states she continues to have pain in her neck, hips, knees, and ankles.  She states she occasionally has swelling in her knees and ankles.  She states her neck is very stiff and has noticed worsening ROM.  She states she does not feel the Remicade and MTX are controlling her symptoms anymore.  She continues to have psoriasis in her ear canals and gluteal region.  She states the Remicade and Clobetasol have not been clearing it up anymore.  She has failed Enbrel in the past.  She states that about  5 years ago she had a colonoscopy, which was negative for inflammatory bowel disease.    Activities of Daily Living:  Patient reports morning stiffness for 1 hour.   Patient Reports nocturnal pain.  Difficulty dressing/grooming: Denies Difficulty climbing stairs: Reports Difficulty getting out of chair: Reports Difficulty using hands for taps, buttons, cutlery, and/or writing: Reports   Review of Systems  Constitutional: Positive for fatigue. Negative for weakness.  HENT: Negative for mouth sores, mouth dryness and nose dryness.   Eyes: Positive for dryness. Negative for redness.  Respiratory: Negative for cough, hemoptysis, shortness of breath and difficulty breathing.   Cardiovascular: Negative for chest pain, palpitations, hypertension and swelling in legs/feet.  Gastrointestinal: Negative for blood in stool, constipation and diarrhea.  Endocrine: Negative for increased urination.  Genitourinary: Negative for painful  urination.  Musculoskeletal: Positive for arthralgias, joint pain, joint swelling and morning stiffness. Negative for myalgias, muscle weakness, muscle tenderness and myalgias.  Skin: Positive for rash (Psoriasis). Negative for color change, pallor, hair loss, nodules/bumps, redness, skin tightness, ulcers and sensitivity to sunlight.  Neurological: Negative for dizziness, numbness and headaches.  Psychiatric/Behavioral: Negative.  Negative for depressed mood and sleep disturbance. The patient is not nervous/anxious.     PMFS History:  Patient Active Problem List   Diagnosis Date Noted  . Routine general medical examination at a health care facility 04/05/2017  . Degenerative arthritis of knee, bilateral 12/20/2016  . Abnormal mammogram 07/04/2012  . Obesity 05/28/2012  . Psoriatic arthritis ([place]) 03/12/2012  . Diabetes mellitus due to underlying condition with hyperglycemia ([place]) 10/03/2011  . Hyperlipidemia associated with type 2 diabetes mellitus ([place]) 05/25/2009  . POLYCYSTIC OVARIES 04/07/2009    Past Medical History:  Diagnosis Date  . Arthritis   . De Quervain's disease (tenosynovitis)   . Dermatophytosis, scalp   . Diabetes mellitus   . Hypercholesteremia   . Obesity   . Polycystic ovarian disease   . Psoriatic arthritis ([place])   . Wears glasses     Family History  Problem Relation Age of Onset  . Diabetes Father   . Hypertension Father   . Stroke Father   . Kidney disease Father   . Heart disease Father   . Cancer Mother 38       breast ca  died at age 19  . Hypertension Unknown   . Diabetes type II Unknown   . Obesity Unknown   . Obesity Brother   . Hypertension Brother   . Heart disease Maternal Grandmother   . Alzheimer's disease Maternal Grandmother   . Diabetes Paternal Grandmother   . Heart disease Paternal Grandfather    Past Surgical History:  Procedure Laterality Date  . CARPAL TUNNEL RELEASE    . COLONOSCOPY    . DE QUERVAIN'S RELEASE  2012    left  . KNEE ARTHROPLASTY    . KNEE ARTHROSCOPY Right 05/06/2013   Procedure: RIGHT KNEE ARTHROSCOPY ;  Surgeon: Hessie Dibble, MD;  Location: [facility];  Service: Orthopedics;  Laterality: Right;  partial lateral minisectomy and chondroplasty  . WISDOM TOOTH EXTRACTION    . WRIST SURGERY  2001   carpel tunnel -rt   Social History   Social History Narrative  . Not on file     Objective: Vital Signs: BP 124/78 (BP Location: Left Arm, Patient Position: Sitting, Cuff Size: Normal)   Pulse 84   Resp 17   Ht 5\' 3"  (1.6 m)   Wt 257 lb (116.6 kg)   BMI 45.53 kg/m    Physical Exam  Constitutional: She is oriented to person, place, and time. She appears well-developed and well-nourished.  HENT:  Head: Normocephalic and atraumatic.  Eyes: Conjunctivae and EOM are normal.  Neck: Normal range of motion.  Cardiovascular: Normal rate, regular rhythm, normal heart sounds and intact distal pulses.  Pulmonary/Chest: Effort normal and breath sounds normal.  Abdominal: Soft. Bowel sounds are normal.  Lymphadenopathy:    She has no cervical adenopathy.  Neurological: She is alert and oriented to person, place, and time.  Skin: Skin is warm and dry. Capillary refill takes less than 2 seconds.  2 psoriasis patches on right gluteal region and in bilateral ear canals   Psychiatric: She has a normal mood and affect. Her behavior is normal.  Nursing note and vitals reviewed.    Musculoskeletal Exam: C-spine limited ROM with discomfort.  Thoracic and lumbar good ROM.  Shoulder joints, elbow joints, and wrist joints good ROM.  MCPs, PIPs, and DIPs good ROM with no synovitis.  Hip joints, knee joints, and ankle joints good ROM with no synovitis.  No knee effusion or warmth.  MTPs, PIPs, and DIPs good ROM with on synovitis.    CDAI Exam: No CDAI exam completed.    Investigation: No additional findings.TB GOLD: 12/2016 negative  CBC Latest Ref Rng & Units 04/05/2017 01/09/2017  10/19/2016  WBC 4.0 - 10.5 K/uL 7.7 6.3 7.6  Hemoglobin 12.0 - 15.0 g/dL 14.0 14.1 14.1  Hematocrit 36.0 - 46.0 % 42.8 43.6 43.5  Platelets 150 - 400 K/uL 193 193 226   CMP Latest Ref Rng & Units 04/05/2017 01/09/2017 10/19/2016  Glucose 65 - 99 mg/dL 150(H) 157(H) 176(H)  BUN 6 - 20 mg/dL 19 11 18   Creatinine 0.44 - 1.00 mg/dL 0.62 0.70 0.60  Sodium 135 - 145 mmol/L 138 138 138  Potassium 3.5 - 5.1 mmol/L 4.3 4.3 4.3  Chloride 101 - 111 mmol/L 105 104 105  CO2 22 - 32 mmol/L 23 24 23   Calcium 8.9 - 10.3 mg/dL 9.2 9.2 9.2  Total Protein 6.5 - 8.1 g/dL 6.6 6.8 6.7  Total Bilirubin 0.3 - 1.2 mg/dL 0.6 0.6 0.1(L)  Alkaline Phos 38 - 126 U/L 71 70 79  AST 15 - 41 U/L 38 37  37  ALT 14 - 54 U/L 60(H) 62(H) 70(H)    Imaging: No results found.  Speciality Comments: Remicade 6mg /kg every 6 weeks, negative TB gold 12/2016    Procedures:  No procedures performed Allergies: Chocolate; Food; Statins; and Latex   Assessment / Plan:     Visit Diagnoses: Psoriatic arthritis ([place]):  Patient is not currently clinically controlled on MTX and Remicade combination.  She has previously failed Enbrel.  Discussed the initation of Taltz.  Side effects, risks, benefits, and contraindications were discussed.  The patient consented. Patient was given a handout on information about Taltz.  We will apply for Taltz. Once Donnetta Hail is  approved her first injection will be 6 weeks after her previous Remicade dose.  Medication counseling: TB Gold: 01/09/17 negative  Hepatitis panel: 11/19/11 negative  HIV: 04/06/17-nonreactive  SPEP: pending with next routine labs Immunoglobulin: pending with next routine labs   Does patient have a history of inflammatory bowel disease? No patient had colonoscopy 5 years ago which was normal per patient.  Counseled patient that Donnetta Hail is a IL-17 inhibitor that works to reduce pain and inflammation associated with arthritis.  Counseled patient on purpose, proper use, and adverse  effects of Taltz. Reviewed the most common adverse effects of infection, inflammatory bowel disease, and allergic reaction.  Reviewed the importance of regular labs while on [place].  Counseled patient that Donnetta Hail should be held prior to scheduled surgery.  Counseled patient to avoid live vaccines while on Taltz.  Advised patient to get annual influenza vaccine and the pneumococcal vaccine as indicated.  Provided patient with medication education material and answered all questions.  Patient consented to [place].  Will upload consent into patient's chart.  Will apply for Taltz through [employer].  Reviewed storage information for Taltz.  Advised initial injection must be administered in office.  Patient voiced understanding.     Psoriasis: Active patches on right gluteal region and bilateral ear canals.  Clobetasol and Remicade have not been clearing the patches like they were previously.    High risk medication use - MTX, folic acid, remicade-CBC and CMP performed 04/05/17.  She will need labs checked after starting [place].  TB gold up to date.   Primary osteoarthritis of both hands: Continues to have discomfort.  PIP and DIP synovial thickening.  Joint protection and muscle strengthening were discussed.    Primary osteoarthritis of both knees: no warmth or effusion.  Occasional discomfort.    Primary osteoarthritis of both feet - BIL calcaneal spurs: Occasional discomfort.    Other medical conditions are listed as follows:   History of PCOS  History of diabetes mellitus  History of hyperlipidemia  History of obesity    Orders: Orders Placed This Encounter  Procedures  . Protein electrophoresis, serum  . IgG, IgA, IgM   No orders of the defined types were placed in this encounter.   Face-to-face time spent with patient was 30 minutes. Greater than 50% of time was spent in counseling and coordination of care.  Follow-Up Instructions: Return in about 5 months (around 10/29/2017)  for Psoriatic arthritis.  Bo Merino, MD Note - This record has been created using Editor, commissioning.  Chart creation errors have been sought, but may not always  have been located. Such creation errors do not reflect on  the standard of medical care.

## 2017-05-23 DIAGNOSIS — H52223 Regular astigmatism, bilateral: Secondary | ICD-10-CM | POA: Diagnosis not present

## 2017-05-23 DIAGNOSIS — E119 Type 2 diabetes mellitus without complications: Secondary | ICD-10-CM | POA: Diagnosis not present

## 2017-05-23 DIAGNOSIS — H524 Presbyopia: Secondary | ICD-10-CM | POA: Diagnosis not present

## 2017-05-23 DIAGNOSIS — H5213 Myopia, bilateral: Secondary | ICD-10-CM | POA: Diagnosis not present

## 2017-05-23 DIAGNOSIS — H40023 Open angle with borderline findings, high risk, bilateral: Secondary | ICD-10-CM | POA: Diagnosis not present

## 2017-05-23 DIAGNOSIS — H2513 Age-related nuclear cataract, bilateral: Secondary | ICD-10-CM | POA: Diagnosis not present

## 2017-05-25 DIAGNOSIS — F4323 Adjustment disorder with mixed anxiety and depressed mood: Secondary | ICD-10-CM | POA: Diagnosis not present

## 2017-05-31 ENCOUNTER — Ambulatory Visit: Payer: 59 | Admitting: Rheumatology

## 2017-05-31 ENCOUNTER — Encounter: Payer: Self-pay | Admitting: Rheumatology

## 2017-05-31 ENCOUNTER — Telehealth: Payer: Self-pay

## 2017-05-31 ENCOUNTER — Ambulatory Visit (INDEPENDENT_AMBULATORY_CARE_PROVIDER_SITE_OTHER): Payer: 59 | Admitting: Rheumatology

## 2017-05-31 VITALS — BP 124/78 | HR 84 | Resp 17 | Ht 63.0 in | Wt 257.0 lb

## 2017-05-31 DIAGNOSIS — M170 Bilateral primary osteoarthritis of knee: Secondary | ICD-10-CM

## 2017-05-31 DIAGNOSIS — L4050 Arthropathic psoriasis, unspecified: Secondary | ICD-10-CM | POA: Diagnosis not present

## 2017-05-31 DIAGNOSIS — M19071 Primary osteoarthritis, right ankle and foot: Secondary | ICD-10-CM | POA: Diagnosis not present

## 2017-05-31 DIAGNOSIS — Z79899 Other long term (current) drug therapy: Secondary | ICD-10-CM | POA: Diagnosis not present

## 2017-05-31 DIAGNOSIS — Z8639 Personal history of other endocrine, nutritional and metabolic disease: Secondary | ICD-10-CM | POA: Diagnosis not present

## 2017-05-31 DIAGNOSIS — M19041 Primary osteoarthritis, right hand: Secondary | ICD-10-CM | POA: Diagnosis not present

## 2017-05-31 DIAGNOSIS — M19072 Primary osteoarthritis, left ankle and foot: Secondary | ICD-10-CM | POA: Diagnosis not present

## 2017-05-31 DIAGNOSIS — F4323 Adjustment disorder with mixed anxiety and depressed mood: Secondary | ICD-10-CM | POA: Diagnosis not present

## 2017-05-31 DIAGNOSIS — L409 Psoriasis, unspecified: Secondary | ICD-10-CM

## 2017-05-31 DIAGNOSIS — Z8742 Personal history of other diseases of the female genital tract: Secondary | ICD-10-CM

## 2017-05-31 DIAGNOSIS — M19042 Primary osteoarthritis, left hand: Secondary | ICD-10-CM | POA: Diagnosis not present

## 2017-05-31 NOTE — Patient Instructions (Signed)
Ixekizumab injection What is this medicine? IXEKIZUMAB (ix e KIZ ue mab) is used to treat plaque psoriasis and psoriatic arthritis. This medicine may be used for other purposes; ask your health care provider or pharmacist if you have questions. COMMON BRAND NAME(S): TALTZ What should I tell my health care provider before I take this medicine? They need to know if you have any of these conditions: -are being treated for an infection -have an infection that is not going away or that keeps coming back -inflammatory bowel disease -immune system problems -recently received or are scheduled to receive a vaccine -tuberculosis, a positive skin test for tuberculosis, or have recently been in close contact with someone who has tuberculosis -an unusual or allergic reaction to ixekizumab, other medicines, foods, dyes or preservatives -pregnant or trying to get pregnant -breast-feeding How should I use this medicine? This medicine is for injection under the skin. It may be administered by a healthcare professional in a hospital or clinic setting or at home. If you get this medicine at home, you will be taught how to prepare and give this medicine. Use exactly as directed. Take your medicine at regular intervals. Do not take your medicine more often than directed. It is important that you put your used needles and syringes in a special sharps container. Do not put them in a trash can. If you do not have a sharps container, call your pharmacist or healthcare provider to get one. A special MedGuide will be given to you by the pharmacist with each prescription and refill. Be sure to read this information carefully each time. Talk to your pediatrician regarding the use of this medicine in children. Special care may be needed. Overdosage: If you think you have taken too much of this medicine contact a poison control center or emergency room at once. NOTE: This medicine is only for you. Do not share this medicine  with others. What if I miss a dose? It is important not to miss your dose. Call your doctor of health care professional if you are unable to keep an appointment. If you give yourself the medicine and you miss a dose, take it as soon as you can. Then be sure to take your next doses on your regular schedule. Do not take double or extra doses. If you have questions about a missed injection, call your health care professional. What may interact with this medicine? Do not take this medicine with any of the following medications: -live virus vaccines This medicine may also interact with the following medications: -cyclosporine -inactivated vaccines -warfarin This list may not describe all possible interactions. Give your health care provider a list of all the medicines, herbs, non-prescription drugs, or dietary supplements you use. Also tell them if you smoke, drink alcohol, or use illegal drugs. Some items may interact with your medicine. What should I watch for while using this medicine? Tell your doctor or healthcare professional if your symptoms do not start to get better or if they get worse. You will be tested for tuberculosis (TB) before you start this medicine. If your doctor prescribes any medicine for TB, you should start taking the TB medicine before starting this medicine. Make sure to finish the full course of TB medicine. Call your doctor or healthcare professional for advice if you get a fever, chills or sore throat, or other symptoms of a cold or flu. Do not treat yourself. This drug decreases your body's ability to fight infections. Try to avoid being around  people who are sick. This medicine can decrease the response to a vaccine. If you need to get vaccinated, tell your healthcare professional if you have received this medicine within the last 6 months. Extra booster doses may be needed. Talk to your doctor to see if a different vaccination schedule is needed. What side effects may I  notice from receiving this medicine? Side effects that you should report to your doctor or health care professional as soon as possible: -allergic reactions like skin rash, itching or hives, swelling of the face, lips, or tongue -signs and symptoms of infection like fever or chills; cough; sore throat; pain or trouble passing urine -signs and symptoms of bowel problems like abdominal pain, diarrhea, blood in the stool, and weight loss -white patches in the mouth or throat -vaginal discharge, itching, or odor in women Side effects that usually do not require medical attention (report to your doctor or health care professional if they continue or are bothersome): -nausea -runny nose -sinus trouble This list may not describe all possible side effects. Call your doctor for medical advice about side effects. You may report side effects to FDA at 1-800-FDA-1088. Where should I keep my medicine? Keep out of the reach of children. Store the prefilled syringe or injection pen in a refrigerator between 2 to 8 degrees C (36 to 46 degrees F). Keep the syringe or the pen in the original carton until ready for use. Protect from light. Do not freeze. Do not shake. Prior to use, remove the syringe or pen from the refrigerator and use within 30 minutes. Throw away any unused medicine after the expiration date on the label. NOTE: This sheet is a summary. It may not cover all possible information. If you have questions about this medicine, talk to your doctor, pharmacist, or health care provider.  2018 Elsevier/Gold Standard (2016-05-15 15:03:58)

## 2017-05-31 NOTE — Telephone Encounter (Signed)
Was asked to submit a prior authorization request for Taltz. Authorization was submitted via cover my meds. Will update once we receive a response.   Earnest Mcgillis, [place], CPhT 4:48 PM

## 2017-06-04 MED FILL — VICTOZA 18 MG/3 ML INJECT P: 18 | 30 days supply | Qty: 9 | Fill #1

## 2017-06-06 NOTE — Telephone Encounter (Signed)
Cosyntex

## 2017-06-06 NOTE — Telephone Encounter (Signed)
Received a fax from [facility] regarding a prior authorization [place] for [place]. Medication is approved when patient has tried any two of the following: Cosnetyx, Enbrel, Humira, Otezla and Stelara. Patient has tried and failed Enbrel and Remicade. What mediation would you like to apply for? Thanks!  Reference number: [id number] Phone number: [phone]  Will send document to scan center.  Madison Direnzo, [place], CPhT 9:13 AM

## 2017-06-18 ENCOUNTER — Encounter: Payer: Self-pay | Admitting: Rheumatology

## 2017-06-18 ENCOUNTER — Telehealth: Payer: Self-pay

## 2017-06-18 NOTE — Telephone Encounter (Signed)
Called pts insurance to submit a prior authorization for Cosnetyx. Spoke with Timmothy Sours who was able to process the request urgently. We should have a response for Cosentyx authorization by tomorrow 06/19/16. Will update once we receive a response.   Kerah Hardebeck, [place], CPhT 9:58 AM

## 2017-06-19 NOTE — Telephone Encounter (Signed)
Called pts insurance to check the status of pts authorization. Spoke with [facility] who states that the pt has been approved   Loading Dose- 150mg  (5 pens) from 06/18/2016 through 07/18/2017 Auth number: [id number] Maintenance Dose- 300mg  (1 pen/month) from 07/11/2017 through 12/08/2017 Auth number: [id number]  Verified with Dr. Estanislado Pandy on dosing. Patient has PSA and will need 300mg  for the loading dose. A new authorization for the loading dose has completed over the phone as an urgent request. Will update once we receive a response.   Called patient to update. Did not receive an answer, Left voicemail.   Valerya Maxton, [place], CPhT 3:46 PM

## 2017-06-20 ENCOUNTER — Telehealth: Payer: Self-pay | Admitting: Rheumatology

## 2017-06-20 NOTE — Telephone Encounter (Signed)
Patient is returning our call to find out if she should still keep her appointment for infusion next Tuesday because she has not heard if they have approved her Consentyx.  CB  [phone]

## 2017-06-20 NOTE — Telephone Encounter (Signed)
Carmell Austria from [employer] left a voicemail requesting prior authorization is required on patient's Consentyx.  Fax # [phone]

## 2017-06-21 NOTE — Telephone Encounter (Signed)
Returned call. Spoke with [name] who states that more information is needed in order to complete pts prior authorization for ENBREL. Additional information was given. We should have a response within 24 hours. Will update once we receive a response.   Called patient to update. Her insurance has approved the maintenance dose and we are waiting to hear back about the loading dose. Once medication has been approved, we will send the Rx to [facility]. First dose will be delivered to the clinic. Once received, we will schedule a nursing visit to get her started. Patient is scheduled for infusion (Remicaide) and is wondering if she should hold off or keep appointment. Once we hear back about her loading dose approval and Dr. Keturah Barre decision on her infusion, we will notify her. Patient voices understanding and denies any questions at this time.   [name], [place], CPhT 9:12 AM

## 2017-06-21 NOTE — Telephone Encounter (Signed)
Left message to advise patient to hold off on her infusion for next week. Patient advised she should hear from [facility] tomorrow about whether or not the Cosentyx loading dose has been approved.

## 2017-06-21 NOTE — Telephone Encounter (Signed)
She should hold off Remicade as Cosyntex will be approved soon and she can start with Cosyntex.

## 2017-06-22 ENCOUNTER — Telehealth: Payer: Self-pay

## 2017-06-22 MED ORDER — SECUKINUMAB 150 MG/ML ~~LOC~~ SOAJ: 300.0000 mg | SUBCUTANEOUS | 0 refills | Status: DC

## 2017-06-22 NOTE — Telephone Encounter (Signed)
Received a fax from [facility] regarding a prior authorization approval for [place] from 06/21/2017 to 12/10/2017.   Reference number:[id number] Phone number: [phone]  Will send document to scan center.  Loading Dose: 06/21/17 through 07/21/2017 3mls (10 pens) per 28 days Maintenance Dose: 07/14/2017 through 12/10/2017 25ml (2 pens) per 28 days  Called patient to update. Helped her to enroll in the Cosnetyx Co-pay card. Rx will be sent to the pharmacy. Once medication is delivered, we will schedule nursing visit. Pt voices understanding and denies any questions at this time.  Please send new Rx to [place] at [facility].   Garielle Mroz, [place], CPhT 8:37 AM

## 2017-06-22 NOTE — Telephone Encounter (Signed)
Prescription sent to the pharmacy and will schedule nurse visit once delivered to the office.

## 2017-06-25 ENCOUNTER — Telehealth: Payer: Self-pay | Admitting: Rheumatology

## 2017-06-25 MED FILL — COSENTYX 300 MG DOSE-2 PENS: 150 | 28 days supply | Qty: 8 | Fill #0

## 2017-06-25 NOTE — Telephone Encounter (Signed)
Returned pts call. Rx was sent to [facility]. Pt got her copay card info and submitted to pharmacy. Once medication has been received, we will contact pt to schedule nursing visit. Pt voices understanding and denies any questions at this time.  Aiman Noe, [place], CPhT 11:10 AM

## 2017-06-25 NOTE — Telephone Encounter (Signed)
Patient returning your call.

## 2017-06-26 ENCOUNTER — Encounter (HOSPITAL_COMMUNITY): Payer: 59

## 2017-06-27 NOTE — Telephone Encounter (Signed)
Patient has been scheduled for a nurse visit to start Cosentyx on 07/05/17 at 10 am.

## 2017-06-27 NOTE — Telephone Encounter (Signed)
Coordinated delivery of Cosentyx from [place]. Medication was placed in the refrigerator. Please schedule nursing visit for pts first dose.   Lisabeth Mian, [place], CPhT 2:14 PM

## 2017-07-02 ENCOUNTER — Telehealth: Payer: Self-pay

## 2017-07-02 NOTE — Telephone Encounter (Signed)
Pt called stating there her insurance ([employer]) states that she may be required for a per-certification for her nursing visit. Because the pt has already purchased the medication and it coming in to be educated on how to use it she should not need a per-cert/prior-auth.  Called her insurance to verify. Spoke with Collie Siad who states that pt will need a pre-cert/pre-auth only if pt was coming in an being billed for an injection in the clinic. Pt will only be educated on how to use medication that was purchased and delivered from the pharmacy and will be monitored for any reactions.   Called pt to update. Pt voices understanding and denies any questions at this time.  Zackerie Sara, [place], CPhT 4:45 PM

## 2017-07-03 MED FILL — SYNJARDY XR 12.5-1,000 MG T: 12.5-1000 | 90 days supply | Qty: 180 | Fill #0

## 2017-07-04 MED FILL — VICTOZA 18 MG/3 ML INJECT P: 18 | 30 days supply | Qty: 9 | Fill #2

## 2017-07-05 ENCOUNTER — Ambulatory Visit: Payer: No Typology Code available for payment source | Admitting: *Deleted

## 2017-07-05 VITALS — BP 125/75 | HR 83

## 2017-07-05 DIAGNOSIS — L4050 Arthropathic psoriasis, unspecified: Secondary | ICD-10-CM | POA: Diagnosis not present

## 2017-07-05 LAB — HEMOGLOBIN A1C: Hemoglobin A1C: 6.8

## 2017-07-05 MED ORDER — SECUKINUMAB 150 MG/ML ~~LOC~~ SOAJ
300.0000 mg | Freq: Once | SUBCUTANEOUS | Status: AC
  Administered 2017-07-05: 300 mg via SUBCUTANEOUS

## 2017-07-05 NOTE — Patient Instructions (Signed)
Standing Labs We placed an order today for your standing lab work.    Please come back and get your standing labs in 1 month and every 3 months  We have open lab Monday through Friday from 8:30-11:30 AM and 1:30-4 PM at the office of Dr. Bo Merino.   The office is located at [address] [place], [place], [place] [address] No appointment is necessary.   Labs are drawn by [facility].  You may receive a bill from [facility] for your lab work. If you have any questions regarding directions or hours of operation,  please call [phone].

## 2017-07-05 NOTE — Progress Notes (Signed)
Patient in office for new start to  Cosentyx. Patient was previously on Remicade and it has been 7 weeks since her last infusion. Patient was given a demonstration for the proper technique on administering medication. Patient was able to demonstrate proper technique.  Patient provided medication and was given an injection in her left and right arm. Patient tolerated injection well. She was monitored in office for 30 minutes after administration for adverse reactions. No adverse reactions noted.  Administrations This Visit    Secukinumab SOAJ 300 mg    Admin Date 07/05/2017 Action Given Dose 300 mg Route Subcutaneous Administered By Carole Binning, LPN

## 2017-07-10 ENCOUNTER — Encounter: Payer: Self-pay | Admitting: Internal Medicine

## 2017-07-30 MED FILL — COSENTYX 300 MG DOSE-2 PENS: 150 | 7 days supply | Qty: 2 | Fill #1

## 2017-08-02 MED FILL — VICTOZA 18 MG/3 ML INJECT P: 18 | 30 days supply | Qty: 9 | Fill #0

## 2017-08-08 ENCOUNTER — Telehealth: Payer: Self-pay | Admitting: Pharmacist

## 2017-08-08 NOTE — Telephone Encounter (Signed)
Called patient to schedule an appointment for the [facility]. I was unable to reach the patient so I left a HIPAA-compliant message requesting that the patient return my call.   

## 2017-08-14 ENCOUNTER — Ambulatory Visit: Payer: Self-pay | Attending: Internal Medicine | Admitting: Pharmacist

## 2017-08-14 ENCOUNTER — Encounter: Payer: Self-pay | Admitting: Pharmacist

## 2017-08-14 DIAGNOSIS — Z79899 Other long term (current) drug therapy: Secondary | ICD-10-CM

## 2017-08-14 NOTE — Progress Notes (Signed)
   S: Patient presents to [facility] for review of their specialty medication therapy.  Patient is currently taking Cosentyx for psoriatic arthritis. Patient is managed by Dr. Estanislado Pandy for this.   She was going to start Donnetta Hail but it was not covered by the insurance, she will have to fail Cosentyx first.   Adherence: denies any missed doses  Efficacy: reports some improvement but not much. Psoriasis has improved some and her arthritis doesn't take as long to stop hurting as it used to. 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  Dosing: completed loading dose.  Screening: TB test: completed per patient Hepatitis: history of elevated LFTs, being followed  Monitoring: S/sx of infection: denies  CBC: going for lab work today S/sx of hypersensitivity: denies S/sx of malignancy: denies Latex allergy: does have allergy but has been able to take the medication with only a little bit of redness  O:     Lab Results  Component Value Date   WBC 7.7 04/05/2017   HGB 14.0 04/05/2017   HCT 42.8 04/05/2017   MCV 91.5 04/05/2017   PLT 193 04/05/2017      Chemistry      Component Value Date/Time   NA 138 04/05/2017 0817   K 4.3 04/05/2017 0817   CL 105 04/05/2017 0817   CO2 23 04/05/2017 0817   BUN 19 04/05/2017 0817   CREATININE 0.62 04/05/2017 0817   CREATININE 0.62 10/26/2011 1644      Component Value Date/Time   CALCIUM 9.2 04/05/2017 0817   ALKPHOS 71 04/05/2017 0817   AST 38 04/05/2017 0817   ALT 60 (H) 04/05/2017 0817   BILITOT 0.6 04/05/2017 0817       A/P: 1. Medication review: Patient currently on Cosentyx for psoriatic arthritis and tolerating it well with some improvement in symptoms. Reviewed medication with her, including the following: Cosentyx is a monoclonal antibody used to treat psoriatic  arthritis. Possible adverse effects include hypersensitivity, infections, inflammatory bowel disease and injection site reactions. Patient should avoid live vaccinations unless otherwise instructed. Recommend regular lab work with rheumatologist. No recommendations for any changes at this time.   Christella Hartigan, PharmD, BCPS, BCACP, [facility] [phone]

## 2017-08-21 ENCOUNTER — Encounter: Payer: Self-pay | Admitting: Rheumatology

## 2017-08-22 NOTE — Telephone Encounter (Signed)
She can have Shingrix which is not a live vaccine.

## 2017-08-23 ENCOUNTER — Other Ambulatory Visit: Payer: Self-pay | Admitting: Rheumatology

## 2017-08-24 MED FILL — UNIFINE PENTIPS 31GX3/16": 31G X 5 MM | 90 days supply | Qty: 100 | Fill #0

## 2017-08-24 MED FILL — UNIFINE PENTIPS 31GX3/16: 31G X 5 MM | 90 days supply | Qty: 100 | Fill #0

## 2017-08-24 NOTE — Telephone Encounter (Addendum)
Last Visit: 05/31/17 Next Visit: 10/30/16 Labs: 04/05/17 stable TB Gold: 01/09/17 Neg   Left message to advise patient she is due for labs.   Okay to refill 30 day supply per Dr. Estanislado Pandy

## 2017-08-27 ENCOUNTER — Other Ambulatory Visit: Payer: Self-pay | Admitting: Pharmacist

## 2017-08-27 MED ORDER — SECUKINUMAB 150 MG/ML ~~LOC~~ SOAJ: 300.0000 mg | SUBCUTANEOUS | 0 refills | Status: DC

## 2017-08-29 ENCOUNTER — Telehealth: Payer: Self-pay

## 2017-08-29 ENCOUNTER — Other Ambulatory Visit: Payer: Self-pay

## 2017-08-29 DIAGNOSIS — Z79899 Other long term (current) drug therapy: Secondary | ICD-10-CM

## 2017-08-29 NOTE — Telephone Encounter (Signed)
Received a message from [facility] stating that pt Rx for Cosnetyx is requiring a PA. Patient is suppose to pick up meds today. Patient has an authorization on file for the loading dose and maintenance dose that should last until 12/10/2017.   Called [employer] to verify. Spoke with Nel who states that the authorization was put in by a representative for 150mg  (maintenance dose) He sent a message to the PA department to update and verify. They will call back with a response by the end of the day. Will update once we receive a response.   Nayellie Sanseverino, [place], CPhT  9:51 AM

## 2017-08-30 MED FILL — COSENTYX 300 MG DOSE-2 PENS: 150 | 28 days supply | Qty: 2 | Fill #0

## 2017-08-30 NOTE — Telephone Encounter (Signed)
Received a call from [place], a representative from [facility] who states that she received the message about pts cosentyx Rx. The pharmacy is running the claim for a 30 days supply and it was approved for a 28 day supply. The authorization is good until 12/10/2017. (300mg s)   Called pharmacy to update. Rx was processed and received a paid claim. Patient will be able to get her medication as planned.   Marialuisa Basara, [place], CPhT 10:31 AM

## 2017-08-31 LAB — PROTEIN ELECTROPHORESIS, SERUM
Albumin ELP: 3.8 g/dL (ref 3.8–4.8)
Alpha 1: 0.3 g/dL (ref 0.2–0.3)
Alpha 2: 0.7 g/dL (ref 0.5–0.9)
Beta 2: 0.4 g/dL (ref 0.2–0.5)
Beta Globulin: 0.4 g/dL (ref 0.4–0.6)
Gamma Globulin: 1 g/dL (ref 0.8–1.7)
Total Protein: 6.6 g/dL (ref 6.1–8.1)

## 2017-08-31 LAB — IGG, IGA, IGM
IgG (Immunoglobin G), Serum: 1044 mg/dL (ref 694–1618)
IgM, Serum: 103 mg/dL (ref 48–271)
Immunoglobulin A: 209 mg/dL (ref 81–463)

## 2017-08-31 NOTE — Progress Notes (Signed)
Labs are WNL.

## 2017-09-12 MED FILL — VICTOZA 18 MG/3 ML INJECT P: 18 | 30 days supply | Qty: 9 | Fill #1

## 2017-09-17 ENCOUNTER — Encounter: Payer: Self-pay | Admitting: Internal Medicine

## 2017-09-17 ENCOUNTER — Ambulatory Visit (INDEPENDENT_AMBULATORY_CARE_PROVIDER_SITE_OTHER): Payer: No Typology Code available for payment source | Admitting: Internal Medicine

## 2017-09-17 DIAGNOSIS — R21 Rash and other nonspecific skin eruption: Secondary | ICD-10-CM | POA: Diagnosis not present

## 2017-09-17 MED ORDER — SULFAMETHOXAZOLE-TRIMETHOPRIM 800-160 MG PO TABS: 1.0000 | ORAL_TABLET | Freq: Two times a day (BID) | ORAL | 0 refills | Status: DC

## 2017-09-17 MED FILL — SULFAMETHOXAZOLE-TMP DS TAB: 800-160 | 7 days supply | Qty: 14 | Fill #0

## 2017-09-17 NOTE — Patient Instructions (Signed)
We will send in bactrim. Take 1 pill twice a day for 1 week.

## 2017-09-17 NOTE — Assessment & Plan Note (Signed)
Concern for skin infection with new cosentyx. Rx for bactrim 1 week. If no improvement is likely hypersensitivity reaction with the new medication and may need adjustment. No cellulitis on exam.

## 2017-09-17 NOTE — Progress Notes (Signed)
   Subjective:    Patient ID: Tabitha [name], female    DOB: 1965-12-03, 52 y.o.   MRN: [id number]  HPI The patient is a 52 YO female coming in for spots on her legs. Started about 1-2 weeks ago with small red lesions. They have grown in size. They are tender to the touch. Denies itching or burning. No numbness or swelling in her legs. She has recently started cosentyx for her PA. She has been screened for TB within the last year. Denies fevers or chills.   Review of Systems  Constitutional: Negative.   Respiratory: Negative for cough, chest tightness and shortness of breath.   Cardiovascular: Negative for chest pain, palpitations and leg swelling.  Gastrointestinal: Negative for abdominal distention, abdominal pain, constipation, diarrhea, nausea and vomiting.  Musculoskeletal: Negative.   Skin: Positive for color change and rash.  Neurological: Negative.       Objective:   Physical Exam  Constitutional: She is oriented to person, place, and time. She appears well-developed and well-nourished.  HENT:  Head: Normocephalic and atraumatic.  Eyes: EOM are normal.  Neck: Normal range of motion.  Cardiovascular: Normal rate and regular rhythm.  Pulmonary/Chest: Effort normal and breath sounds normal. No respiratory distress. She has no wheezes. She has no rales.  Abdominal: Soft. Bowel sounds are normal. She exhibits no distension. There is no tenderness. There is no rebound.  Musculoskeletal: She exhibits no edema.  Neurological: She is alert and oriented to person, place, and time. Coordination normal.  Skin: Skin is warm and dry. Rash noted.  Two lesions on the left shin, 1 about 1 cm circular with redness to the surface and firm nodule subcutaneous, other about 1-2 mm. Right leg with several 1-2 mm lesions with scant erythema on the skin and subc nodules   Vitals:   09/17/17 0806  BP: 114/70  Pulse: 82  Temp: 99 F (37.2 C)  TempSrc: Oral  SpO2: 94%  Weight: 258 lb (117 kg)    Height: 5\' 3"  (1.6 m)      Assessment & Plan:

## 2017-09-21 ENCOUNTER — Other Ambulatory Visit: Payer: Self-pay | Admitting: Rheumatology

## 2017-09-24 ENCOUNTER — Encounter: Payer: Self-pay | Admitting: Rheumatology

## 2017-09-24 ENCOUNTER — Other Ambulatory Visit: Payer: Self-pay | Admitting: *Deleted

## 2017-09-24 DIAGNOSIS — Z79899 Other long term (current) drug therapy: Secondary | ICD-10-CM

## 2017-09-24 LAB — CBC WITH DIFFERENTIAL/PLATELET
Basophils Absolute: 51 cells/uL (ref 0–200)
Basophils Relative: 0.6 %
Eosinophils Absolute: 187 cells/uL (ref 15–500)
Eosinophils Relative: 2.2 %
HCT: 42.3 % (ref 35.0–45.0)
Hemoglobin: 14.2 g/dL (ref 11.7–15.5)
Lymphs Abs: 2134 cells/uL (ref 850–3900)
MCH: 29.7 pg (ref 27.0–33.0)
MCHC: 33.6 g/dL (ref 32.0–36.0)
MCV: 88.5 fL (ref 80.0–100.0)
MPV: 11.8 fL (ref 7.5–12.5)
Monocytes Relative: 8.3 %
Neutro Abs: 5423 cells/uL (ref 1500–7800)
Neutrophils Relative %: 63.8 %
Platelets: 228 10*3/uL (ref 140–400)
RBC: 4.78 10*6/uL (ref 3.80–5.10)
RDW: 12.5 % (ref 11.0–15.0)
Total Lymphocyte: 25.1 %
WBC mixed population: 706 cells/uL (ref 200–950)
WBC: 8.5 10*3/uL (ref 3.8–10.8)

## 2017-09-24 LAB — COMPLETE METABOLIC PANEL WITH GFR
AG Ratio: 1.3 (calc) (ref 1.0–2.5)
ALT: 45 U/L — ABNORMAL HIGH (ref 6–29)
AST: 23 U/L (ref 10–35)
Albumin: 4 g/dL (ref 3.6–5.1)
Alkaline phosphatase (APISO): 77 U/L (ref 33–130)
BUN: 19 mg/dL (ref 7–25)
CO2: 24 mmol/L (ref 20–32)
Calcium: 9.5 mg/dL (ref 8.6–10.4)
Chloride: 103 mmol/L (ref 98–110)
Creat: 0.86 mg/dL (ref 0.50–1.05)
GFR, Est African American: 89 mL/min/{1.73_m2} (ref >=60)
GFR, Est Non African American: 77 mL/min/{1.73_m2} (ref >=60)
Globulin: 3 g/dL (calc) (ref 1.9–3.7)
Glucose, Bld: 249 mg/dL — ABNORMAL HIGH (ref 65–99)
Potassium: 4.4 mmol/L (ref 3.5–5.3)
Sodium: 138 mmol/L (ref 135–146)
Total Bilirubin: 0.3 mg/dL (ref 0.2–1.2)
Total Protein: 7 g/dL (ref 6.1–8.1)

## 2017-09-25 NOTE — Progress Notes (Signed)
Glu elevated. ALT is better

## 2017-09-26 ENCOUNTER — Encounter: Payer: Self-pay | Admitting: Internal Medicine

## 2017-09-26 ENCOUNTER — Encounter: Payer: Self-pay | Admitting: Rheumatology

## 2017-09-27 ENCOUNTER — Other Ambulatory Visit: Payer: Self-pay | Admitting: Rheumatology

## 2017-09-27 NOTE — Telephone Encounter (Signed)
Last Visit: 05/31/17 Next Visit: 10/30/16 Labs: 09/24/17 Glu elevated. ALT is better TB Gold: 01/09/17 Neg   Okay to refill  per Dr. Estanislado Pandy

## 2017-09-28 ENCOUNTER — Other Ambulatory Visit: Payer: Self-pay | Admitting: Pharmacist

## 2017-09-28 MED ORDER — SECUKINUMAB 150 MG/ML ~~LOC~~ SOAJ: 300.0000 mg | SUBCUTANEOUS | 0 refills | Status: DC

## 2017-09-28 MED FILL — COSENTYX 300 MG DOSE-2 PENS: 150 | 28 days supply | Qty: 2 | Fill #0

## 2017-10-03 ENCOUNTER — Encounter: Payer: Self-pay | Admitting: Internal Medicine

## 2017-10-08 MED FILL — METHOTREXATE 25 MG/ML VIAL: 50 | 84 days supply | Qty: 6 | Fill #0

## 2017-10-08 MED FILL — SYNJARDY XR 12.5-1,000 MG T: 12.5-1000 | 90 days supply | Qty: 180 | Fill #1

## 2017-10-08 MED FILL — ACCU-CHEK GUIDE TEST STRIP: 90 days supply | Qty: 200 | Fill #0

## 2017-10-08 MED FILL — VICTOZA 18 MG/3 ML INJECT P: 18 | 30 days supply | Qty: 9 | Fill #2

## 2017-10-17 NOTE — Progress Notes (Signed)
Office Visit Note  Patient: Tabitha [name]             Date of Birth: [birth year]           MRN: [id number]             PCP: Hoyt Koch, MD Referring: Hoyt Koch, * Visit Date: 10/30/2017 Occupation: @GUAROCC @    Subjective:  Joint pain and joint swelling.   History of Present Illness: Tabitha [name] is a 52 y.o. female with history of psoriatic arthritis and psoriasis.  According to her she takes Cosentyx injection which lasted only for 2 weeks and the symptoms start coming back.  She has been experiencing joint pain and joint swelling.  She had films some psoriasis in her ear canals.  She also developed a rash on her lower extremity for which she was seen by her dermatologist who diagnosed it with erythema nodosum.  She has noticed increased pain in her shoulders recently.  She is also noticed some pain and swelling in her wrist and hands.   Activities of Daily Living:  Patient reports morning stiffness for 1 hour.   Patient Denies nocturnal pain.  Difficulty dressing/grooming: Denies Difficulty climbing stairs: Reports Difficulty getting out of chair: Reports Difficulty using hands for taps, buttons, cutlery, and/or writing: Reports   Review of Systems  Constitutional: Positive for fatigue. Negative for night sweats, weight gain and weight loss.  HENT: Negative for mouth sores, trouble swallowing, trouble swallowing, mouth dryness and nose dryness.   Eyes: Negative for pain, redness, visual disturbance and dryness.  Respiratory: Negative for cough, shortness of breath and difficulty breathing.   Cardiovascular: Negative for chest pain, palpitations, hypertension, irregular heartbeat and swelling in legs/feet.  Gastrointestinal: Negative for blood in stool, constipation and diarrhea.  Endocrine: Negative for increased urination.  Genitourinary: Negative for vaginal dryness.  Musculoskeletal: Positive for arthralgias, joint pain, joint swelling and morning  stiffness. Negative for myalgias, muscle weakness, muscle tenderness and myalgias.  Skin: Positive for rash. Negative for color change, hair loss, skin tightness, ulcers and sensitivity to sunlight.  Allergic/Immunologic: Negative for susceptible to infections.  Neurological: Negative for dizziness, memory loss, night sweats and weakness.  Hematological: Negative for swollen glands.  Psychiatric/Behavioral: Negative for depressed mood and sleep disturbance. The patient is not nervous/anxious.     PMFS History:  Patient Active Problem List   Diagnosis Date Noted  . Primary osteoarthritis of both hands 10/30/2017  . Primary osteoarthritis of both feet 10/30/2017  . Rash 09/17/2017  . Routine general medical examination at a health care facility 04/05/2017  . Degenerative arthritis of knee, bilateral 12/20/2016  . Abnormal mammogram 07/04/2012  . Obesity 05/28/2012  . Psoriatic arthritis ([place]) 03/12/2012  . Diabetes mellitus due to underlying condition with hyperglycemia ([place]) 10/03/2011  . Psoriasis 03/30/2011  . Hyperlipidemia associated with type 2 diabetes mellitus ([place]) 05/25/2009  . POLYCYSTIC OVARIES 04/07/2009    Past Medical History:  Diagnosis Date  . Arthritis   . De Quervain's disease (tenosynovitis)   . Dermatophytosis, scalp   . Diabetes mellitus   . Hypercholesteremia   . Obesity   . Polycystic ovarian disease   . Psoriatic arthritis ([place])   . Wears glasses     Family History  Problem Relation Age of Onset  . Diabetes Father   . Hypertension Father   . Stroke Father   . Kidney disease Father   . Heart disease Father   . Cancer Mother 36  breast ca died at age 59  . Hypertension Unknown   . Diabetes type II Unknown   . Obesity Unknown   . Obesity Brother   . Hypertension Brother   . Heart disease Maternal Grandmother   . Alzheimer's disease Maternal Grandmother   . Diabetes Paternal Grandmother   . Heart disease Paternal Grandfather    Past  Surgical History:  Procedure Laterality Date  . CARPAL TUNNEL RELEASE    . COLONOSCOPY    . DE QUERVAIN'S RELEASE  2012   left  . KNEE ARTHROPLASTY    . KNEE ARTHROSCOPY Right 05/06/2013   Procedure: RIGHT KNEE ARTHROSCOPY ;  Surgeon: Hessie Dibble, MD;  Location: [facility];  Service: Orthopedics;  Laterality: Right;  partial lateral minisectomy and chondroplasty  . WISDOM TOOTH EXTRACTION    . WRIST SURGERY  2001   carpel tunnel -rt   Social History   Social History Narrative  . Not on file     Objective: Vital Signs: BP 126/78 (BP Location: Left Arm, Patient Position: Sitting, Cuff Size: Large)   Pulse 79   Resp 17   Ht 5\' 3"  (1.6 m)   Wt 256 lb (116.1 kg)   BMI 45.35 kg/m    Physical Exam  Constitutional: She is oriented to person, place, and time. She appears well-developed and well-nourished.  HENT:  Head: Normocephalic and atraumatic.  Eyes: Conjunctivae and EOM are normal.  Neck: Normal range of motion.  Cardiovascular: Normal rate, regular rhythm, normal heart sounds and intact distal pulses.  Pulmonary/Chest: Effort normal and breath sounds normal.  Abdominal: Soft. Bowel sounds are normal.  Lymphadenopathy:    She has no cervical adenopathy.  Neurological: She is alert and oriented to person, place, and time.  Skin: Skin is warm and dry. Capillary refill takes less than 2 seconds.  Erythematous subcutaneous nodules on the lower extremities.  Psychiatric: She has a normal mood and affect. Her behavior is normal.  Nursing note and vitals reviewed.    Musculoskeletal Exam: C-spine thoracic lumbar spine good range of motion.  Shoulder joints she has discomfort range of motion.  Elbows and wrist joints are good range of motion.  She has DIP PIP thickening in her hands with no synovitis.  She has warmth in her bilateral knee joints.  Ankles MTPs PIPs were in good range of motion.  She does have osteoarthritic changes in her hands and feet.  CDAI  Exam: CDAI Homunculus Exam:   Tenderness:  RLE: tibiofemoral LLE: tibiofemoral  Swelling:  RLE: tibiofemoral LLE: tibiofemoral  Joint Counts:  CDAI Tender Joint count: 2 CDAI Swollen Joint count: 2  Global Assessments:  Patient Global Assessment: 6 Provider Global Assessment: 6  CDAI Calculated Score: 16    Investigation: No additional findings.TB Gold: 01/09/2017 Negative  CBC Latest Ref Rng & Units 09/24/2017 04/05/2017 01/09/2017  WBC 3.8 - 10.8 Thousand/uL 8.5 7.7 6.3  Hemoglobin 11.7 - 15.5 g/dL 14.2 14.0 14.1  Hematocrit 35.0 - 45.0 % 42.3 42.8 43.6  Platelets 140 - 400 Thousand/uL 228 193 193   CMP Latest Ref Rng & Units 09/24/2017 08/29/2017 04/05/2017  Glucose 65 - 99 mg/dL 249(H) - 150(H)  BUN 7 - 25 mg/dL 19 - 19  Creatinine 0.50 - 1.05 mg/dL 0.86 - 0.62  Sodium 135 - 146 mmol/L 138 - 138  Potassium 3.5 - 5.3 mmol/L 4.4 - 4.3  Chloride 98 - 110 mmol/L 103 - 105  CO2 20 - 32 mmol/L 24 -  23  Calcium 8.6 - 10.4 mg/dL 9.5 - 9.2  Total Protein 6.1 - 8.1 g/dL 7.0 6.6 6.6  Total Bilirubin 0.2 - 1.2 mg/dL 0.3 - 0.6  Alkaline Phos 38 - 126 U/L - - 71  AST 10 - 35 U/L 23 - 38  ALT 6 - 29 U/L 45(H) - 60(H)    Imaging: No results found.  Speciality Comments: Remicade 6mg /kg every 6 weeks, negative TB gold 12/2016    Procedures:  No procedures performed Allergies: Chocolate; Food; Statins; and Latex   Assessment / Plan:     Visit Diagnoses: Psoriatic arthritis (HCC)-patient reports frequent flares after 2 weeks of her previous Cosentyx dose.  I do not see any synovitis on examination today.  Although she continues to have some warmth and swelling in her bilateral knee joints.  We discussed spacing Cosentyx to 1 injection every 2 weeks.  She also had problems with elevated LFTs she is on low-dose methotrexate.  I will increase methotrexate 2.6 mL subcu weekly.  Psoriasis-she has few psoriasis lesions in her ear canal.  High risk medication use - cosentyx 300 mg  sq, MTX 0.5 ml sq q wk, folic acid 2 mg poqd(failed Humira and Remicade).  Her labs have been stable except for mild elevation of her LFTs.  Erythema nodosum-she will be appears to have erythema nodosum lesions on her lower extremities which are not different to stages.  She states she has seen a dermatologist who confirmed the diagnosis.  I will give her a prednisone taper starting at 20 mg and taper by 5 mg every 2 days.  She is diabetic she will monitor her blood sugar closely.  She states she also has a sliding scale.  Primary osteoarthritis of both hands-chronic pain and stiffness  Primary osteoarthritis of both knees-she has warmth in her bilateral knee joints.  Primary osteoarthritis of both feet - BIL calcaneal spurs  History of obesity-she has joined weight watchers and is trying to lose weight.  History of PCOS  History of hyperlipidemia  History of diabetes mellitus    Orders: No orders of the defined types were placed in this encounter.  Meds ordered this encounter  Medications  . methotrexate 50 MG/2ML injection    Sig: INJECT 0.6 ML SUBCUTANEOUSLY EVERY FRIDAY    Dispense:  8 mL    Refill:  0  . predniSONE (DELTASONE) 5 MG tablet    Sig: Take 4 tablets x2 days, 3 tablets x2 days, 2 tablets x2 days, 1 tablet x2 days then stop.    Dispense:  20 tablet    Refill:  0    Face-to-face time spent with patient was  30 minutes.> 50% of time was spent in counseling and coordination of care.  Follow-Up Instructions: Return in about 4 months (around 03/02/2018) for Psoriatic arthritis.   Bo Merino, MD  Note - This record has been created using Editor, commissioning.  Chart creation errors have been sought, but may not always  have been located. Such creation errors do not reflect on  the standard of medical care.

## 2017-10-29 MED FILL — COSENTYX 300 MG DOSE-2 PENS: 150 | 28 days supply | Qty: 2 | Fill #1

## 2017-10-30 ENCOUNTER — Ambulatory Visit: Payer: 59 | Admitting: Rheumatology

## 2017-10-30 ENCOUNTER — Encounter: Payer: Self-pay | Admitting: Rheumatology

## 2017-10-30 VITALS — BP 126/78 | HR 79 | Resp 17 | Ht 63.0 in | Wt 256.0 lb

## 2017-10-30 DIAGNOSIS — L409 Psoriasis, unspecified: Secondary | ICD-10-CM | POA: Diagnosis not present

## 2017-10-30 DIAGNOSIS — L4050 Arthropathic psoriasis, unspecified: Secondary | ICD-10-CM

## 2017-10-30 DIAGNOSIS — Z8742 Personal history of other diseases of the female genital tract: Secondary | ICD-10-CM

## 2017-10-30 DIAGNOSIS — M19042 Primary osteoarthritis, left hand: Secondary | ICD-10-CM

## 2017-10-30 DIAGNOSIS — Z79899 Other long term (current) drug therapy: Secondary | ICD-10-CM | POA: Diagnosis not present

## 2017-10-30 DIAGNOSIS — M19072 Primary osteoarthritis, left ankle and foot: Secondary | ICD-10-CM | POA: Diagnosis not present

## 2017-10-30 DIAGNOSIS — M170 Bilateral primary osteoarthritis of knee: Secondary | ICD-10-CM

## 2017-10-30 DIAGNOSIS — M19041 Primary osteoarthritis, right hand: Secondary | ICD-10-CM | POA: Diagnosis not present

## 2017-10-30 DIAGNOSIS — L52 Erythema nodosum: Secondary | ICD-10-CM

## 2017-10-30 DIAGNOSIS — Z8639 Personal history of other endocrine, nutritional and metabolic disease: Secondary | ICD-10-CM | POA: Diagnosis not present

## 2017-10-30 DIAGNOSIS — M19071 Primary osteoarthritis, right ankle and foot: Secondary | ICD-10-CM | POA: Diagnosis not present

## 2017-10-30 MED ORDER — PREDNISONE 5 MG PO TABS: ORAL_TABLET | ORAL | 0 refills | Status: DC

## 2017-10-30 MED ORDER — METHOTREXATE SODIUM CHEMO INJECTION 50 MG/2ML: INTRAMUSCULAR | 0 refills | Status: DC

## 2017-10-30 MED FILL — METHOTREXATE 50 MG/2 ML VIA: 50 | 46 days supply | Qty: 4 | Fill #0

## 2017-10-30 MED FILL — predniSONE 5 MG TABS: 5 | 8 days supply | Qty: 20 | Fill #0

## 2017-10-30 NOTE — Patient Instructions (Signed)
Standing Labs We placed an order today for your standing lab work.    Please come back and get your standing labs in July and every 3 months   We have open lab Monday through Friday from 8:30-11:30 AM and 1:30-4:00 PM  at the office of Dr. Lashante Fryberger.   You may experience shorter wait times on Monday and Friday afternoons. The office is located at [address], [address], [place], [place] [address] No appointment is necessary.   Labs are drawn by [facility].  You may receive a bill from [facility] for your lab work. If you have any questions regarding directions or hours of operation,  please call [phone].    

## 2017-11-13 NOTE — Progress Notes (Signed)
[name] [place], [place] [address] Phone: [phone] Subjective:     CC: Left shoulder pain, back pain follow-up  [id number]  Tabitha [name] is a 52 y.o. female coming in with complaint of left shoulder pain.   Onset- Chronic Location- Anterior Duration- all day pain Character- Sharp, sore Aggravating factors- Shoulder flexion Reliving factors- Therapies tried- Tylenol, pennsaid, Ice Severity-7 out of 10  Patient is also had back and neck pain.  Has been seen multiple times.  Responded very well to osteopathic manipulation in the past.  Also bilateral knee pain.  Has had arthritic changes previously.  Did have exacerbation of Visco supplementation.  Wants to avoid doing surgical intervention.  Noticing increasing swelling again.     Past Medical History:  Diagnosis Date  . Arthritis   . De Quervain's disease (tenosynovitis)   . Dermatophytosis, scalp   . Diabetes mellitus   . Hypercholesteremia   . Obesity   . Polycystic ovarian disease   . Psoriatic arthritis ([place])   . Wears glasses    Past Surgical History:  Procedure Laterality Date  . CARPAL TUNNEL RELEASE    . COLONOSCOPY    . DE QUERVAIN'S RELEASE  2012   left  . KNEE ARTHROPLASTY    . KNEE ARTHROSCOPY Right 05/06/2013   Procedure: RIGHT KNEE ARTHROSCOPY ;  Surgeon: Hessie Dibble, MD;  Location: [facility];  Service: Orthopedics;  Laterality: Right;  partial lateral minisectomy and chondroplasty  . WISDOM TOOTH EXTRACTION    . WRIST SURGERY  2001   carpel tunnel -rt   Social History   Socioeconomic History  . Marital status: Legally Separated    Spouse name: Not on file  . Number of children: Not on file  . Years of education: Not on file  . Highest education level: Not on file  Occupational History  . 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 Financial resource strain: Not on file    . Food insecurity:    Worry: Not on file    Inability: Not on file  . Transportation needs:    Medical: Not on file    Non-medical: Not on file  Tobacco Use  . Smoking status: Former Smoker    Packs/day: 1.30    Years: 12.00    Pack years: 15.60    Types: Cigarettes    Start date: 06/12/1984    Last attempt to quit: 03/18/1995    Years since quitting: 22.6  . Smokeless tobacco: Never Used  . Tobacco comment: Not interested in returning to smoking.   Substance and Sexual Activity  . Alcohol use: Yes    Comment: rarely  . Drug use: No  . Sexual activity: Yes    Partners: Male    Comment: married  Lifestyle  . Physical activity:    Days per week: Not on file    Minutes per session: Not on file  . Stress: Not on file  Relationships  . Social connections:    Talks on phone: Not on file    Gets together: Not on file    Attends religious service: Not on file    Active member of club or organization: Not on file    Attends meetings of clubs or organizations: Not on file    Relationship status: Not on file  Other Topics Concern  . Not on file  Social History Narrative  . Not on file   Allergies  Allergen Reactions  . Chocolate Anaphylaxis  . Food     chocolate  . Statins     Intolerant  lft elevation  . Latex Rash   Family History  Problem Relation Age of Onset  . Diabetes Father   . Hypertension Father   . Stroke Father   . Kidney disease Father   . Heart disease Father   . Cancer Mother 86       breast ca died at age 54  . Hypertension Unknown   . Diabetes type II Unknown   . Obesity Unknown   . Obesity Brother   . Hypertension Brother   . Heart disease Maternal Grandmother   . Alzheimer's disease Maternal Grandmother   . Diabetes Paternal Grandmother   . Heart disease Paternal Grandfather      Past medical history, social, surgical and family history all reviewed in electronic medical record.  No pertanent information unless stated regarding to the  chief complaint.   Review of Systems:Review of systems updated and as accurate as of 11/14/17  No headache, visual changes, nausea, vomiting, diarrhea, constipation, dizziness, abdominal pain, skin rash, fevers, chills, night sweats, weight loss, swollen lymph nodes, , chest pain, shortness of breath, mood changes.  Positive body aches, joint swelling, muscle aches  Objective  Blood pressure 124/84, pulse 78, height 5\' 3"  (1.6 m), weight 252 lb (114.3 kg), SpO2 98 %. Systems examined below as of 11/14/17   General: No apparent distress alert and oriented x3 mood and affect normal, dressed appropriately.  HEENT: Pupils equal, extraocular movements intact  Respiratory: Patient's speak in full sentences and does not appear short of breath  Cardiovascular: No lower extremity edema, non tender, no erythema  Skin: Warm dry intact with no signs of infection or rash on extremities or on axial skeleton.  Abdomen: Soft nontender  Neuro: Cranial nerves II through XII are intact, neurovascularly intact in all extremities with 2+ DTRs and 2+ pulses.  Lymph: No lymphadenopathy of posterior or anterior cervical chain or axillae bilaterally.  Gait normal with good balance and coordination.  MSK:  Non tender with full range of motion and good stability and symmetric strength and tone of  elbows, wrist, hip, knee and ankles bilaterally.   Left shoulder exam on inspection shows no gross deformity.  Patient has near full range of motion.  Positive speeds test with very mild impingement noted.  Negative O'Brien's.  Full strength of the rotator cuff compared to the contralateral side.  Limited musculoskeletal ultrasound was performed and interpreted by Lyndal Pulley  Limited ultrasound of patient's left shoulder shows that patient does have significant hypoechoic changes within the left bicep tendon sheath.  Mild subacromial bursitis under the supraspinatus but no true tearing appreciated.  Erosive changes of the  underlying bone noted.   Knee: Bilateral valgus deformity noted. Large thigh to calf ratio.  Effusion noted bilaterally Tender to palpation over medial and PF joint line.  ROM full in flexion and extension and lower leg rotation. instability with valgus force.  painful patellar compression. Patellar glide with moderate crepitus. Patellar and quadriceps tendons unremarkable. Hamstring and quadriceps strength is normal. Contralateral knee shows  Procedure: Real-time Ultrasound Guided Injection of left knee Device: GE Logiq Q7 Ultrasound guided injection is preferred based studies that show increased duration, increased effect, greater accuracy, decreased procedural pain, increased response rate, and decreased cost with ultrasound guided versus blind injection.  Verbal informed consent obtained.  Time-out conducted.  Noted no overlying erythema, induration, or other signs of local infection.  Skin prepped in a sterile fashion.  Local anesthesia: Topical Ethyl chloride.  With sterile technique and under real time ultrasound guidance: With a 22-gauge 2 inch needle patient was injected with 4 cc of 0.5% Marcaine and 1 cc of Kenalog 40 mg/dL. This was from a superior lateral approach.  Completed without difficulty  Pain immediately resolved suggesting accurate placement of the medication.  Advised to call if fevers/chills, erythema, induration, drainage, or persistent bleeding.  Images permanently stored and available for review in the ultrasound unit.  Impression: Technically successful ultrasound guided injection.  Procedure: Real-time Ultrasound Guided Injection of right knee Device: GE Logiq Q7 Ultrasound guided injection is preferred based studies that show increased duration, increased effect, greater accuracy, decreased procedural pain, increased response rate, and decreased cost with ultrasound guided versus blind injection.  Verbal informed consent obtained.  Time-out conducted.    Noted no overlying erythema, induration, or other signs of local infection.  Skin prepped in a sterile fashion.  Local anesthesia: Topical Ethyl chloride.  With sterile technique and under real time ultrasound guidance: With a 22-gauge 2 inch needle patient was injected with 4 cc of 0.5% Marcaine and 1 cc of Kenalog 40 mg/dL. This was from a superior lateral approach.  Completed without difficulty  Pain immediately resolved suggesting accurate placement of the medication.  Advised to call if fevers/chills, erythema, induration, drainage, or persistent bleeding.  Images permanently stored and available for review in the ultrasound unit.  Impression: Technically successful ultrasound guided injection.   Impression and Recommendations:     This case required medical decision making of moderate complexity.      Note: This dictation was prepared with Dragon dictation along with smaller phrase technology. Any transcriptional errors that result from this process are unintentional.

## 2017-11-14 ENCOUNTER — Ambulatory Visit (INDEPENDENT_AMBULATORY_CARE_PROVIDER_SITE_OTHER): Payer: No Typology Code available for payment source | Admitting: Family Medicine

## 2017-11-14 ENCOUNTER — Encounter: Payer: Self-pay | Admitting: Family Medicine

## 2017-11-14 ENCOUNTER — Ambulatory Visit: Payer: Self-pay

## 2017-11-14 VITALS — BP 124/84 | HR 78 | Ht 63.0 in | Wt 252.0 lb

## 2017-11-14 DIAGNOSIS — M170 Bilateral primary osteoarthritis of knee: Secondary | ICD-10-CM | POA: Diagnosis not present

## 2017-11-14 DIAGNOSIS — L4050 Arthropathic psoriasis, unspecified: Secondary | ICD-10-CM | POA: Diagnosis not present

## 2017-11-14 DIAGNOSIS — M7522 Bicipital tendinitis, left shoulder: Secondary | ICD-10-CM | POA: Insufficient documentation

## 2017-11-14 DIAGNOSIS — M25512 Pain in left shoulder: Principal | ICD-10-CM

## 2017-11-14 DIAGNOSIS — G8929 Other chronic pain: Secondary | ICD-10-CM | POA: Diagnosis not present

## 2017-11-14 MED ORDER — VITAMIN D (ERGOCALCIFEROL) 1.25 MG (50000 UNIT) PO CAPS: 50000.0000 [IU] | ORAL_CAPSULE | ORAL | 0 refills | Status: DC

## 2017-11-14 MED FILL — VIT D2 1.25 MG (50,000 UNIT: 1.25 MG | 84 days supply | Qty: 12 | Fill #0

## 2017-11-14 MED FILL — VICTOZA 18 MG/3 ML INJECT P: 18 | 30 days supply | Qty: 9 | Fill #3

## 2017-11-14 NOTE — Assessment & Plan Note (Signed)
Bicep tendinitis.  Discussed compression, home exercise, proper lifting mechanics.  Follow-up again in 4 to 6 weeks.

## 2017-11-14 NOTE — Patient Instructions (Signed)
Good to see you  Tabitha [name] is your friend.  Once weekly vitamin D for 12 weeks at least  I would also consider tart cherry extract any dose at night Look into saffron but can be expensive.  Injected the knees  Read about PRP Compression to the arm with activity See me again in 3-4 weeks

## 2017-11-14 NOTE — Assessment & Plan Note (Signed)
Bilateral injections given again today.  Could be candidate for Visco supplementation again if patient wants to try this.  Patient wants to avoid any surgical intervention.  I do believe the patient's underlying psoriatic arthritis is likely contributing to some of the pain.  Discussed icing regimen and home exercise.  Follow-up again in 4 to 6 weeks

## 2017-11-21 ENCOUNTER — Other Ambulatory Visit: Payer: Self-pay | Admitting: Rheumatology

## 2017-11-21 ENCOUNTER — Other Ambulatory Visit: Payer: Self-pay | Admitting: Internal Medicine

## 2017-11-21 MED ORDER — SECUKINUMAB 150 MG/ML ~~LOC~~ SOAJ: 150.0000 mg | SUBCUTANEOUS | 0 refills | Status: DC

## 2017-11-21 NOTE — Telephone Encounter (Signed)
Last Visit: 10/30/17 Next Visit: 03/05/18 Labs: 09/24/17 Glu elevated. ALT is better TB Gold: 01/09/17   Okay to refill per Dr. Estanislado Pandy

## 2017-11-23 MED FILL — COSENTYX 300 MG DOSE-2 PENS: 150 | 28 days supply | Qty: 2 | Fill #0

## 2017-12-20 ENCOUNTER — Telehealth: Payer: Self-pay | Admitting: Pharmacy Technician

## 2017-12-20 NOTE — Telephone Encounter (Signed)
Received a Prior Authorization request from [facility] for Cosentyx 300mg . Authorization has been submitted to patient's insurance via Cover My Meds. Will update once we receive a response.  9:50 AM Beatriz Chancellor, CPhT

## 2017-12-21 ENCOUNTER — Ambulatory Visit: Payer: No Typology Code available for payment source | Admitting: Family Medicine

## 2017-12-21 ENCOUNTER — Encounter: Payer: Self-pay | Admitting: Family Medicine

## 2017-12-21 VITALS — BP 114/82 | HR 76 | Ht 63.0 in | Wt 247.0 lb

## 2017-12-21 DIAGNOSIS — M545 Low back pain, unspecified: Secondary | ICD-10-CM | POA: Insufficient documentation

## 2017-12-21 DIAGNOSIS — M999 Biomechanical lesion, unspecified: Secondary | ICD-10-CM | POA: Diagnosis not present

## 2017-12-21 DIAGNOSIS — M170 Bilateral primary osteoarthritis of knee: Secondary | ICD-10-CM | POA: Diagnosis not present

## 2017-12-21 DIAGNOSIS — G8929 Other chronic pain: Secondary | ICD-10-CM

## 2017-12-21 DIAGNOSIS — M549 Dorsalgia, unspecified: Secondary | ICD-10-CM | POA: Insufficient documentation

## 2017-12-21 NOTE — Assessment & Plan Note (Signed)
Patient does have arthritic changes.  Seems to be mostly in the patellofemoral.  We will get an approval for Visco supplementation.  Discussed icing regimen, home exercises, topical anti-inflammatories.  Follow-up again in 4 weeks

## 2017-12-21 NOTE — Assessment & Plan Note (Signed)
Decision today to treat with OMT was based on Physical Exam  After verbal consent patient was treated with HVLA, ME, FPR techniques in cervical, thoracic, rib, lumbar and sacral areas  Patient tolerated the procedure well with improvement in symptoms  Patient given exercises, stretches and lifestyle modifications  See medications in patient instructions if given  Patient will follow up in 4-6 weeks 

## 2017-12-21 NOTE — Patient Instructions (Signed)
Good to see you  We will get monovisc approved- sorry for the delay  Ice the arm at night Stay active.  For the back see me again in 1-2 months

## 2017-12-21 NOTE — Progress Notes (Signed)
[name] [place], [place] [address] Phone: [phone] Subjective:      CC: Knee pain and back pain follow-up  [id number]  Tabitha [name] is a 52 y.o. female coming in with complaint of  Back pain.  Patient has had this for quite some time.  Seems to be multifactorial.  Has responded fairly well to osteopathic manipulation over the years.  Discussed icing regimen which she has been doing relatively constant.  Patient does have psoriatic arthritis.  No radicular symptoms.  Patient is also having bilateral knee pain.  Patient has known patellofemoral arthritis.  Did have injections at last exam.  Right one was done in the left one still though is having pain and swelling.  Some mild instability.  Worse with going up or down stairs      Past Medical History:  Diagnosis Date  . Arthritis   . De Quervain's disease (tenosynovitis)   . Dermatophytosis, scalp   . Diabetes mellitus   . Hypercholesteremia   . Obesity   . Polycystic ovarian disease   . Psoriatic arthritis ([place])   . Wears glasses    Past Surgical History:  Procedure Laterality Date  . CARPAL TUNNEL RELEASE    . COLONOSCOPY    . DE QUERVAIN'S RELEASE  2012   left  . KNEE ARTHROPLASTY    . KNEE ARTHROSCOPY Right 05/06/2013   Procedure: RIGHT KNEE ARTHROSCOPY ;  Surgeon: Hessie Dibble, MD;  Location: [facility];  Service: Orthopedics;  Laterality: Right;  partial lateral minisectomy and chondroplasty  . WISDOM TOOTH EXTRACTION    . WRIST SURGERY  2001   carpel tunnel -rt   Social History   Socioeconomic History  . Marital status: Legally Separated    Spouse name: Not on file  . Number of children: Not on file  . Years of education: Not on file  . Highest education level: Not on file  Occupational History  . 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 Financial resource strain: Not on file  . Food  insecurity:    Worry: Not on file    Inability: Not on file  . Transportation needs:    Medical: Not on file    Non-medical: Not on file  Tobacco Use  . Smoking status: Former Smoker    Packs/day: 1.30    Years: 12.00    Pack years: 15.60    Types: Cigarettes    Start date: 06/12/1984    Last attempt to quit: 03/18/1995    Years since quitting: 22.7  . Smokeless tobacco: Never Used  . Tobacco comment: Not interested in returning to smoking.   Substance and Sexual Activity  . Alcohol use: Yes    Comment: rarely  . Drug use: No  . Sexual activity: Yes    Partners: Male    Comment: married  Lifestyle  . Physical activity:    Days per week: Not on file    Minutes per session: Not on file  . Stress: Not on file  Relationships  . Social connections:    Talks on phone: Not on file    Gets together: Not on file    Attends religious service: Not on file    Active member of club or organization: Not on file    Attends meetings of clubs or organizations: Not on file    Relationship status:  Not on file  Other Topics Concern  . Not on file  Social History Narrative  . Not on file   Allergies  Allergen Reactions  . Chocolate Anaphylaxis  . Food     chocolate  . Statins     Intolerant  lft elevation  . Latex Rash   Family History  Problem Relation Age of Onset  . Diabetes Father   . Hypertension Father   . Stroke Father   . Kidney disease Father   . Heart disease Father   . Cancer Mother 103       breast ca died at age 95  . Hypertension Unknown   . Diabetes type II Unknown   . Obesity Unknown   . Obesity Brother   . Hypertension Brother   . Heart disease Maternal Grandmother   . Alzheimer's disease Maternal Grandmother   . Diabetes Paternal Grandmother   . Heart disease Paternal Grandfather      Past medical history, social, surgical and family history all reviewed in electronic medical record.  No pertanent information unless stated regarding to the chief  complaint.   Review of Systems:Review of systems updated and as accurate as of 12/21/17  No headache, visual changes, nausea, vomiting, diarrhea, constipation, dizziness, abdominal pain, skin rash, fevers, chills, night sweats, weight loss, swollen lymph nodes, body aches,  muscle aches, chest pain, shortness of breath, mood changes.  Positive joint swelling  Objective  Blood pressure 114/82, pulse 76, height 5\' 3"  (1.6 m), weight 247 lb (112 kg), SpO2 97 %. Systems examined below as of 12/21/17   General: No apparent distress alert and oriented x3 mood and affect normal, dressed appropriately.  HEENT: Pupils equal, extraocular movements intact  Respiratory: Patient's speak in full sentences and does not appear short of breath  Cardiovascular: No lower extremity edema, non tender, no erythema  Skin: Warm dry intact with no signs of infection or rash on extremities or on axial skeleton.  Abdomen: Soft nontender  Neuro: Cranial nerves II through XII are intact, neurovascularly intact in all extremities with 2+ DTRs and 2+ pulses.  Lymph: No lymphadenopathy of posterior or anterior cervical chain or axillae bilaterally.  Gait normal with good balance and coordination.  MSK:  Non tender with full range of motion and good stability and symmetric strength and tone of shoulders, elbows, wrist, hip, and ankles bilaterally.  Left knee exam shows patient does have swelling.  He does have some limited range of motion lacking last 5 degrees of extension in the last 15 degrees of flexion.  Mild instability and positive patellar grind  Back Exam:  Inspection: Loss of lordosis Motion: Flexion 45 deg, Extension 25 deg, Side Bending to 35 deg bilaterally, Rotation to 35 deg bilaterally  SLR laying: Negative  XSLR laying: Negative  Palpable tenderness: Tender to palpation in the paraspinal musculature on the left sacral area in the paraspinal musculature lumbar spine. FABER: Significant tightness  bilaterally. Sensory change: Gross sensation intact to all lumbar and sacral dermatomes.  Reflexes: 2+ at both patellar tendons, 2+ at achilles tendons, Babinski's downgoing.  Strength at foot  Plantar-flexion: 5/5 Dorsi-flexion: 5/5 Eversion: 5/5 Inversion: 5/5  Leg strength  Quad: 5/5 Hamstring: 5/5 Hip flexor: 5/5 Hip abductors: 5/5  Gait unremarkable.  Osteopathic findings C6 flexed rotated and side bent left T3 extended rotated and side bent right inhaled third rib T6 extended rotated and side bent right  L4 flexed rotated and side bent right Sacrum right on right  Impression and Recommendations:     This case required medical decision making of moderate complexity.      Note: This dictation was prepared with Dragon dictation along with smaller phrase technology. Any transcriptional errors that result from this process are unintentional.

## 2017-12-21 NOTE — Assessment & Plan Note (Signed)
Patient does have some tightness overall.  Multifactorial with poor core strength.  Discussed icing regimen and home exercises.  Follow-up again in 4 to 6 weeks

## 2017-12-24 ENCOUNTER — Telehealth: Payer: Self-pay | Admitting: Rheumatology

## 2017-12-24 MED FILL — UNIFINE PENTIPS 31GX3/16: 31G X 5 MM | 90 days supply | Qty: 100 | Fill #1

## 2017-12-24 MED FILL — VICTOZA 18 MG/3 ML INJECT P: 18 | 30 days supply | Qty: 9 | Fill #4

## 2017-12-24 MED FILL — UNIFINE PENTIPS 31GX3/16": 31G X 5 MM | 90 days supply | Qty: 100 | Fill #1

## 2017-12-24 NOTE — Telephone Encounter (Signed)
Patient requesting info in regards to Cosyntyx  Prior auth. Patient coming up on being due for next injection. (due yesterday). Patient has not received meds yet. Please call patient to inform.

## 2017-12-24 NOTE — Telephone Encounter (Signed)
Patient advised that PA has been submitted and we are awaiting the response. Patient will come the office for a sample.

## 2017-12-28 ENCOUNTER — Telehealth: Payer: Self-pay | Admitting: Rheumatology

## 2017-12-28 MED FILL — COSENTYX 300 MG DOSE-2 PENS: 150 | 28 days supply | Qty: 2 | Fill #1

## 2017-12-28 NOTE — Telephone Encounter (Signed)
Heidi from [facility] left a voicemail stating they sent a fax on Monday 12/24/17 regarding patient's prescription of Cosentyx.  To renew the prior authorization they needed to know if there was a 20% or greater improvement using the medication.  If you have any questions, please call [phone]  Prior Auth [id number]

## 2018-01-02 NOTE — Telephone Encounter (Signed)
Received fax from [facility] requesting additional information for PA request. Plan wanted to know if patient had experienced a 20% or greater improvement in tender joint count while on therapy. Completed and faxed required document to plan. Awaiting response.   Fax# [phone] Ref#- [id number]  9:40 AM Beatriz Chancellor, CPhT

## 2018-01-04 MED FILL — SYNJARDY XR 12.5-1,000 MG T: 12.5-1000 | 90 days supply | Qty: 180 | Fill #2

## 2018-01-08 NOTE — Telephone Encounter (Signed)
Received a fax from [facility] regarding a prior authorization for Cosentyx. Authorization has been APPROVED from 12/28/2017 to 12/28/2018.   Will send document to scan center.  Authorization # [id number] Phone # [phone]  8:49 AM Beatriz Chancellor, CPhT

## 2018-01-11 ENCOUNTER — Other Ambulatory Visit: Payer: Self-pay | Admitting: Pharmacist

## 2018-01-11 ENCOUNTER — Other Ambulatory Visit: Payer: Self-pay | Admitting: *Deleted

## 2018-01-11 ENCOUNTER — Other Ambulatory Visit: Payer: Self-pay

## 2018-01-11 ENCOUNTER — Telehealth: Payer: Self-pay | Admitting: Rheumatology

## 2018-01-11 DIAGNOSIS — R5383 Other fatigue: Secondary | ICD-10-CM

## 2018-01-11 DIAGNOSIS — M2550 Pain in unspecified joint: Secondary | ICD-10-CM

## 2018-01-11 DIAGNOSIS — Z79899 Other long term (current) drug therapy: Secondary | ICD-10-CM

## 2018-01-11 DIAGNOSIS — L4050 Arthropathic psoriasis, unspecified: Secondary | ICD-10-CM

## 2018-01-11 MED ORDER — SECUKINUMAB 150 MG/ML ~~LOC~~ SOAJ: 150.0000 mg | SUBCUTANEOUS | 0 refills | Status: DC

## 2018-01-11 MED ORDER — FOLIC ACID 1 MG PO TABS: 1.0000 mg | ORAL_TABLET | Freq: Two times a day (BID) | ORAL | 4 refills | Status: DC

## 2018-01-11 MED FILL — FOLIC ACID 1 MG TABS: 1 | 90 days supply | Qty: 180 | Fill #0

## 2018-01-11 NOTE — Progress Notes (Signed)
Last visit: 10/30/2017 Next visit: 03/05/2018 Labs: 01/11/2018 (pending) Tb gold: 01/11/2018 (pending)  Okay to refill per Dr. Estanislado Pandy.

## 2018-01-11 NOTE — Telephone Encounter (Signed)
Patient request refill on Cosentyx sent to [facility]. Per patient pharmacy has sent request 7/26, and 8/2. Patient is completely out of meds.

## 2018-01-11 NOTE — Telephone Encounter (Signed)
Prescription has been sent to the pharmacy. 

## 2018-01-13 LAB — CBC WITH DIFFERENTIAL/PLATELET
Basophils Absolute: 65 cells/uL (ref 0–200)
Basophils Relative: 0.6 %
Eosinophils Absolute: 227 cells/uL (ref 15–500)
Eosinophils Relative: 2.1 %
HCT: 44 % (ref 35.0–45.0)
Hemoglobin: 15 g/dL (ref 11.7–15.5)
Lymphs Abs: 2106 cells/uL (ref 850–3900)
MCH: 30.2 pg (ref 27.0–33.0)
MCHC: 34.1 g/dL (ref 32.0–36.0)
MCV: 88.5 fL (ref 80.0–100.0)
MPV: 11.6 fL (ref 7.5–12.5)
Monocytes Relative: 7.8 %
Neutro Abs: 7560 cells/uL (ref 1500–7800)
Neutrophils Relative %: 70 %
Platelets: 237 10*3/uL (ref 140–400)
RBC: 4.97 10*6/uL (ref 3.80–5.10)
RDW: 14.1 % (ref 11.0–15.0)
Total Lymphocyte: 19.5 %
WBC mixed population: 842 cells/uL (ref 200–950)
WBC: 10.8 10*3/uL (ref 3.8–10.8)

## 2018-01-13 LAB — COMPLETE METABOLIC PANEL WITH GFR
AG Ratio: 1.7 (calc) (ref 1.0–2.5)
ALT: 43 U/L — ABNORMAL HIGH (ref 6–29)
AST: 24 U/L (ref 10–35)
Albumin: 4.4 g/dL (ref 3.6–5.1)
Alkaline phosphatase (APISO): 79 U/L (ref 33–130)
BUN: 15 mg/dL (ref 7–25)
CO2: 24 mmol/L (ref 20–32)
Calcium: 10.2 mg/dL (ref 8.6–10.4)
Chloride: 102 mmol/L (ref 98–110)
Creat: 0.59 mg/dL (ref 0.50–1.05)
GFR, Est African American: 121 mL/min/{1.73_m2} (ref >=60)
GFR, Est Non African American: 105 mL/min/{1.73_m2} (ref >=60)
Globulin: 2.6 g/dL (calc) (ref 1.9–3.7)
Glucose, Bld: 76 mg/dL (ref 65–99)
Potassium: 4.3 mmol/L (ref 3.5–5.3)
Sodium: 138 mmol/L (ref 135–146)
Total Bilirubin: 0.5 mg/dL (ref 0.2–1.2)
Total Protein: 7 g/dL (ref 6.1–8.1)

## 2018-01-13 LAB — QUANTIFERON-TB GOLD PLUS
Mitogen-NIL: >10 IU/mL
NIL: 0.08 IU/mL
QuantiFERON-TB Gold Plus: NEGATIVE
TB1-NIL: 0 IU/mL
TB2-NIL: 0 IU/mL

## 2018-01-14 NOTE — Progress Notes (Signed)
Labs are stable.

## 2018-01-16 MED FILL — VICTOZA 18 MG/3 ML INJECT P: 18 | 30 days supply | Qty: 9 | Fill #0

## 2018-01-17 ENCOUNTER — Telehealth: Payer: No Typology Code available for payment source | Admitting: Family

## 2018-01-17 DIAGNOSIS — T300 Burn of unspecified body region, unspecified degree: Secondary | ICD-10-CM | POA: Diagnosis not present

## 2018-01-17 MED ORDER — SILVER SULFADIAZINE 1 % EX CREA: 1.0000 "application " | TOPICAL_CREAM | Freq: Two times a day (BID) | CUTANEOUS | 0 refills | Status: DC

## 2018-01-17 MED FILL — SILVADENE 1% CREAM: 1 | 15 days supply | Qty: 50 | Fill #0

## 2018-01-17 NOTE — Progress Notes (Signed)
Thank you for the details you included in the comment boxes. Those details are very helpful in determining the best course of treatment for you and help [place] to provide the best care.  E-VISIT for Burn  We are sorry that you are not feeling well. Here is how we plan to help!  Based on what you have shared with me it looks like you may have: 2nd degree burn with or without blisters.   Second-degree burns take 14-21 days to heal.  After the burn has healed the skin may look a little darker or lighter than before.  Based on your assessment:  I have prescribed Silvadene 1% cream.  Apply with gloves to affected area 1-2 times a day.  Apply a non-stick dressing such as Telfa to the site after you apply the cream.  You may hold the dressing in place with either paper tape or self adhering wrap such as Coban.  Product similar to Telfa and Coban can be bought at any pharmacy.  If you have a question ask your pharmacist.  [place] are a type of painful wound caused by thermal, electrical, chemical, or electromagnetic energy.  Smoking and open flame are the leading cause of burn injury for older adults.  Scalding from a hot liquid is the leading cause of burn injury for children.  Both infants and older adults are the greatest risk for burn injury.  First degree burns effect only the outer layers of the skin.  The burn may be red and painful but the skin does not blister.  Long term tissue damage is rare.  Second degree burns involve the surface of the skin and the adjacent skin layers.  The burn sire also appears red and painful and the skin often swells and/or blisters.  Third degree burns destroy both layers of the skin and can also penetrate to underlying  Structures.  A third degree burn may not initially hurt because nerve endings were destroyed.  All third degree burns should be evaluated in person.  HOME CARE:   Wash the area gently with soap and water once a day  Apply antibiotic ointment directly  to a Band-Aid or dressing and apply Band-Aid or dressing over the burn.  Change dressing every other day.  Use warm water and 1 or 2 wipes with a wet washcloth to remove any surface debris.  Some of the newer antibiotic ointments contain lidocaine that can help to control the localized pain of the burn.  You should leave intact blisters alone for the first 7 days.  After a week you may gently remove blisters.  The easiest way to do this is gently wipe away the dead skin with wet gauze or wet washcloth.  If that fails you may carefully trim off the dead skin with a pair of fine scissors.  Be sure to clean the scissors in alcohol before use.  GET HELP RIGHT AWAY IF:   The area of the burn is larger than 4 palms of our hand.  You become short of breath.  The site looks infected.  Your symptoms persist after you have completed your treatment plan.  The burn has not healed in 14 days.    MAKE SURE YOU:   Understand these instructions.  Will watch your condition.  Will get help right away if you are not doing well or get worse.  Your e-visit answers were reviewed by a board certified advanced clinical practitioner to complete your personal care plan.  Depending  upon the condition, your plan could have included both over the counter or prescription medications.    Please review your pharmacy choice.  Make sure the pharmacy is open so you can pick up prescription now.   If there is a problem, you may contact your provider through [employer] and have the prescription routed to another pharmacy. Your safety is important to [place].  If you have drug allergies check your prescription carefully.    For the next 24 hours you can use MyChart to ask questions about today's visit, request a non-urgent call back, or ask for a work or school excuse.  You will get an email in the next 2 days asking about your experience.  I hope that your e-visit has been valuable and will speed your  recovery.  If you need an urgent face to face visit, [facility] has four urgent care centers for your convenience.  . [facility] a Provider at this Location  [address] [place], [place] [address] . 8 am to 8 pm Monday-Friday . 9 am to 7 pm Saturday-Sunday  . [facility] [place] a Provider at this Location  [address], [place], [place] [address] . 8 am to 8 pm Monday-Friday . 9 am to 6 pm Saturday . 11 am to 6 pm Sunday   . [facility] [place] Get Driving Directions  [address] [place], [place] [address] . 8 am to 8 pm Monday-Friday . 9 am to 4 pm Saturday-Sunday   . [facility] (a walk in primary care provider)  [facility] a Provider at this Location  [place], [place] [address] . 8 am to 8:30 pm Monday-Thursday . 8 am to 6 pm Friday . 8 am to 4 pm Saturday-Sunday

## 2018-01-18 MED FILL — COSENTYX 300 MG DOSE-2 PENS: 150 | 28 days supply | Qty: 2 | Fill #0

## 2018-01-21 NOTE — Progress Notes (Deleted)
[name] [address] [place], [place] [address] Phone: [phone] Subjective:    I'm seeing this patient by the request  of:    CC:   [id number]  Tabitha [name] is a 52 y.o. female coming in with complaint of ***  Onset-  Location Duration-  Character- Aggravating factors- Reliving factors-  Therapies tried-  Severity-     Past Medical History:  Diagnosis Date  . Arthritis   . De Quervain's disease (tenosynovitis)   . Dermatophytosis, scalp   . Diabetes mellitus   . Hypercholesteremia   . Obesity   . Polycystic ovarian disease   . Psoriatic arthritis ([place])   . Wears glasses    Past Surgical History:  Procedure Laterality Date  . CARPAL TUNNEL RELEASE    . COLONOSCOPY    . DE QUERVAIN'S RELEASE  2012   left  . KNEE ARTHROPLASTY    . KNEE ARTHROSCOPY Right 05/06/2013   Procedure: RIGHT KNEE ARTHROSCOPY ;  Surgeon: Hessie Dibble, MD;  Location: [facility];  Service: Orthopedics;  Laterality: Right;  partial lateral minisectomy and chondroplasty  . WISDOM TOOTH EXTRACTION    . WRIST SURGERY  2001   carpel tunnel -rt   Social History   Socioeconomic History  . Marital status: Legally Separated    Spouse name: Not on file  . Number of children: Not on file  . Years of education: Not on file  . Highest education level: Not on file  Occupational History  . 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 Financial resource strain: Not on file  . Food insecurity:    Worry: Not on file    Inability: Not on file  . Transportation needs:    Medical: Not on file    Non-medical: Not on file  Tobacco Use  . Smoking status: Former Smoker    Packs/day: 1.30    Years: 12.00    Pack years: 15.60    Types: Cigarettes    Start date: 06/12/1984    Last attempt to quit: 03/18/1995    Years since quitting: 22.8  . Smokeless tobacco: Never Used  . Tobacco comment: Not  interested in returning to smoking.   Substance and Sexual Activity  . Alcohol use: Yes    Comment: rarely  . Drug use: No  . Sexual activity: Yes    Partners: Male    Comment: married  Lifestyle  . Physical activity:    Days per week: Not on file    Minutes per session: Not on file  . Stress: Not on file  Relationships  . Social connections:    Talks on phone: Not on file    Gets together: Not on file    Attends religious service: Not on file    Active member of club or organization: Not on file    Attends meetings of clubs or organizations: Not on file    Relationship status: Not on file  Other Topics Concern  . Not on file  Social History Narrative  . Not on file   Allergies  Allergen Reactions  . Chocolate Anaphylaxis  . Food     chocolate  . Statins     Intolerant  lft elevation  . Latex Rash   Family History  Problem Relation Age of Onset  . Diabetes Father   . Hypertension Father   . Stroke  Father   . Kidney disease Father   . Heart disease Father   . Cancer Mother 22       breast ca died at age 22  . Hypertension Unknown   . Diabetes type II Unknown   . Obesity Unknown   . Obesity Brother   . Hypertension Brother   . Heart disease Maternal Grandmother   . Alzheimer's disease Maternal Grandmother   . Diabetes Paternal Grandmother   . Heart disease Paternal Grandfather      Past medical history, social, surgical and family history all reviewed in electronic medical record.  No pertanent information unless stated regarding to the chief complaint.   Review of Systems:Review of systems updated and as accurate as of 01/21/18  No headache, visual changes, nausea, vomiting, diarrhea, constipation, dizziness, abdominal pain, skin rash, fevers, chills, night sweats, weight loss, swollen lymph nodes, body aches, joint swelling, muscle aches, chest pain, shortness of breath, mood changes.   Objective  There were no vitals taken for this visit. Systems  examined below as of 01/21/18   General: No apparent distress alert and oriented x3 mood and affect normal, dressed appropriately.  HEENT: Pupils equal, extraocular movements intact  Respiratory: Patient's speak in full sentences and does not appear short of breath  Cardiovascular: No lower extremity edema, non tender, no erythema  Skin: Warm dry intact with no signs of infection or rash on extremities or on axial skeleton.  Abdomen: Soft nontender  Neuro: Cranial nerves II through XII are intact, neurovascularly intact in all extremities with 2+ DTRs and 2+ pulses.  Lymph: No lymphadenopathy of posterior or anterior cervical chain or axillae bilaterally.  Gait normal with good balance and coordination.  MSK:  Non tender with full range of motion and good stability and symmetric strength and tone of shoulders, elbows, wrist, hip, knee and ankles bilaterally.     Impression and Recommendations:     This case required medical decision making of moderate complexity.      Note: This dictation was prepared with Dragon dictation along with smaller phrase technology. Any transcriptional errors that result from this process are unintentional.

## 2018-01-22 ENCOUNTER — Ambulatory Visit: Payer: No Typology Code available for payment source | Admitting: Family Medicine

## 2018-01-23 ENCOUNTER — Ambulatory Visit: Payer: No Typology Code available for payment source | Admitting: Family Medicine

## 2018-02-14 ENCOUNTER — Other Ambulatory Visit: Payer: Self-pay | Admitting: Rheumatology

## 2018-02-14 DIAGNOSIS — L4050 Arthropathic psoriasis, unspecified: Secondary | ICD-10-CM

## 2018-02-14 MED ORDER — METHOTREXATE SODIUM CHEMO INJECTION 50 MG/2ML: INTRAMUSCULAR | 0 refills | Status: DC

## 2018-02-14 MED FILL — VICTOZA 18 MG/3 ML INJECT P: 18 | 30 days supply | Qty: 9 | Fill #1

## 2018-02-14 MED FILL — METHOTREXATE 25 MG/ML VIAL: 50 | 70 days supply | Qty: 6 | Fill #0

## 2018-02-14 MED FILL — COSENTYX 300 MG DOSE-2 PENS: 150 | 28 days supply | Qty: 2 | Fill #1

## 2018-02-14 NOTE — Telephone Encounter (Signed)
Last visit: 10/30/2017 Next visit: 03/05/2018 Labs: 01/11/2018 stable   Okay to refill per Dr. Estanislado Pandy.

## 2018-02-19 NOTE — Progress Notes (Signed)
Office Visit Note  Patient: Tabitha [name]             Date of Birth: 1966-05-24           MRN: [id number]             PCP: Hoyt Koch, MD Referring: Hoyt Koch, * Visit Date: 03/05/2018 Occupation: @GUAROCC @  Subjective: Medication management.   History of Present Illness: Tabitha [name] is a 52 y.o. female history of psoriatic arthritis, psoriasis and osteoarthritis.  She states that split dose of Cosentyx has been more effective.  She has been his spacing Cosentyx and methotrexate to prevent flares.  She has had discomfort in her knee joints off and on.  She has few breakouts of psoriasis on her neck and ear canal.  She took a short course of prednisone in May and no more prednisone was given since then.  Activities of Daily Living:  Patient reports morning stiffness for 45 minutes.   Patient Denies nocturnal pain.  Difficulty dressing/grooming: Denies Difficulty climbing stairs: Reports Difficulty getting out of chair: Denies Difficulty using hands for taps, buttons, cutlery, and/or writing: Denies  Review of Systems  Constitutional: Positive for fatigue. Negative for night sweats, weight gain and weight loss.  HENT: Negative for mouth sores, trouble swallowing, trouble swallowing, mouth dryness and nose dryness.   Eyes: Positive for dryness. Negative for pain, redness and visual disturbance.       Allergies  Respiratory: Negative for cough, shortness of breath and difficulty breathing.   Cardiovascular: Negative for chest pain, palpitations, hypertension, irregular heartbeat and swelling in legs/feet.  Gastrointestinal: Negative for blood in stool, constipation and diarrhea.  Endocrine: Negative for increased urination.  Genitourinary: Negative for vaginal dryness.  Musculoskeletal: Positive for arthralgias, joint pain and morning stiffness. Negative for joint swelling, myalgias, muscle weakness, muscle tenderness and myalgias.  Skin: Positive for rash.  Negative for color change, hair loss, skin tightness, ulcers and sensitivity to sunlight.  Allergic/Immunologic: Negative for susceptible to infections.  Neurological: Negative for dizziness, memory loss, night sweats and weakness.  Hematological: Negative for swollen glands.  Psychiatric/Behavioral: Negative for depressed mood and sleep disturbance. The patient is not nervous/anxious.     PMFS History:  Patient Active Problem List   Diagnosis Date Noted  . Back pain 12/21/2017  . Nonallopathic lesion of cervical region 12/21/2017  . Biceps tendinitis of left shoulder 11/14/2017  . Primary osteoarthritis of both hands 10/30/2017  . Primary osteoarthritis of both feet 10/30/2017  . Rash 09/17/2017  . Routine general medical examination at a health care facility 04/05/2017  . Degenerative arthritis of knee, bilateral 12/20/2016  . Nonallopathic lesion of thoracic region 10/04/2013  . Abnormal mammogram 07/04/2012  . Obesity 05/28/2012  . Psoriatic arthritis ([facility]) 03/12/2012  . Diabetes mellitus due to underlying condition with hyperglycemia ([facility]) 10/03/2011  . Psoriasis 03/30/2011  . Hyperlipidemia associated with type 2 diabetes mellitus ([facility]) 05/25/2009  . POLYCYSTIC OVARIES 04/07/2009    Past Medical History:  Diagnosis Date  . Arthritis   . De Quervain's disease (tenosynovitis)   . Dermatophytosis, scalp   . Diabetes mellitus   . Hypercholesteremia   . Obesity   . Polycystic ovarian disease   . Psoriatic arthritis ([facility])   . Wears glasses     Family History  Problem Relation Age of Onset  . Diabetes Father   . Hypertension Father   . Stroke Father   . Kidney disease Father   . Heart  disease Father   . Cancer Mother 76       breast ca died at age 74  . Hypertension Unknown   . Diabetes type II Unknown   . Obesity Unknown   . Obesity Brother   . Hypertension Brother   . Heart disease Maternal Grandmother   . Alzheimer's disease Maternal Grandmother   . Diabetes  Paternal Grandmother   . Heart disease Paternal Grandfather    Past Surgical History:  Procedure Laterality Date  . CARPAL TUNNEL RELEASE    . COLONOSCOPY    . DE QUERVAIN'S RELEASE  2012   left  . KNEE ARTHROPLASTY    . KNEE ARTHROSCOPY Right 05/06/2013   Procedure: RIGHT KNEE ARTHROSCOPY ;  Surgeon: Hessie Dibble, MD;  Location: [facility];  Service: Orthopedics;  Laterality: Right;  partial lateral minisectomy and chondroplasty  . WISDOM TOOTH EXTRACTION    . WRIST SURGERY  2001   carpel tunnel -rt   Social History   Social History Narrative  . Not on file    Objective: Vital Signs: BP 120/76 (BP Location: Left Arm, Patient Position: Sitting, Cuff Size: Large)   Pulse 86   Resp 15   Ht 5\' 3"  (1.6 m)   Wt 244 lb 6.4 oz (110.9 kg)   BMI 43.29 kg/m    Physical Exam  Constitutional: She is oriented to person, place, and time. She appears well-developed and well-nourished.  HENT:  Head: Normocephalic and atraumatic.  Eyes: Conjunctivae and EOM are normal.  Neck: Normal range of motion.  Cardiovascular: Normal rate, regular rhythm, normal heart sounds and intact distal pulses.  Pulmonary/Chest: Effort normal and breath sounds normal.  Abdominal: Soft. Bowel sounds are normal.  Lymphadenopathy:    She has no cervical adenopathy.  Neurological: She is alert and oriented to person, place, and time.  Skin: Skin is warm and dry. Capillary refill takes less than 2 seconds. Rash noted.  1 patch on the nape of the neck  Psychiatric: She has a normal mood and affect. Her behavior is normal.  Nursing note and vitals reviewed.    Musculoskeletal Exam: C-spine thoracic lumbar spine good range of motion.  Shoulder joints elbow joints wrist joints were in good range of motion.  She has DIP PIP thickening in her hands with no synovitis.  A mucinous cyst was noted over the right second DIP.  Hip joints and knee joints were in good range of motion.  She is crepitus in  her bilateral knee joints.  No synovitis was noted on examination today.  CDAI Exam: CDAI Score: 0.6  Patient Global Assessment: 3 (mm); Provider Global Assessment: 3 (mm) Swollen: 0 ; Tender: 0  Joint Exam   Not documented   There is currently no information documented on the homunculus. Go to the Rheumatology activity and complete the homunculus joint exam.  Investigation: No additional findings.  Imaging: No results found.  Recent Labs: Lab Results  Component Value Date   WBC 10.8 01/11/2018   HGB 15.0 01/11/2018   PLT 237 01/11/2018   NA 138 01/11/2018   K 4.3 01/11/2018   CL 102 01/11/2018   CO2 24 01/11/2018   GLUCOSE 76 01/11/2018   BUN 15 01/11/2018   CREATININE 0.59 01/11/2018   BILITOT 0.5 01/11/2018   ALKPHOS 71 04/05/2017   AST 24 01/11/2018   ALT 43 (H) 01/11/2018   PROT 7.0 01/11/2018   ALBUMIN 3.7 04/05/2017   CALCIUM 10.2 01/11/2018   GFRAA  121 01/11/2018   QFTBGOLD Negative 01/09/2017   QFTBGOLDPLUS NEGATIVE 01/11/2018    Speciality Comments: Failed Humira/Remicade  Procedures:  No procedures performed Allergies: Chocolate; Food; Statins; and Latex   Assessment / Plan:     Visit Diagnoses: Psoriatic arthritis (HCC)-patient had no active synovitis on examination today.  She states she has increased joint pain and symptoms just prior to her Cosentyx injections.  Psoriasis-she has a small patch of psoriasis on the nape of her neck.  She has been using topical steroids for that.  High risk medications (not anticoagulants) long-term use - cosentyx 150 mg sq every 2 weeks, MTX 0.6 ml sq q wk, folic acid 2 mg poqd(failed Humira and Remicade).  Her labs have been stable except for mild elevation of LFTs.  We will continue to monitor labs every 3 months.  Erythema nodosum-she has healed lesions on her bilateral lower extremities.  She states she has mild flares which get better after Cosentyx injections.  Primary osteoarthritis of both hands-she has  osteoarthritis in her both hands with mucin cyst on her right second DIP.  Primary osteoarthritis of both knees-she feels instability in her knee joints and chronic discomfort.  I will refer her to physical therapy for lower extremity muscle strengthening.  Patellofemoral arthritis of left knee  Primary osteoarthritis of both feet-pain is tolerable currently.  Obesity-weight loss diet and exercise was discussed at length.  History of PCOS  History of hyperlipidemia  History of diabetes mellitus   Association of heart disease with psoriatic arthritis was discussed. Need to monitor blood pressure, cholesterol, and to exercise 30-60 minutes on daily basis was discussed.   Orders: No orders of the defined types were placed in this encounter.  No orders of the defined types were placed in this encounter.   Face-to-face time spent with patient was 30 minutes. Greater than 50% of time was spent in counseling and coordination of care.  Follow-Up Instructions: Return in about 5 months (around 08/05/2018) for Psoriatic arthritis,Ps, Osteoarthritis.   Bo Merino, MD  Note - This record has been created using Editor, commissioning.  Chart creation errors have been sought, but may not always  have been located. Such creation errors do not reflect on  the standard of medical care.

## 2018-03-05 ENCOUNTER — Ambulatory Visit: Payer: No Typology Code available for payment source | Admitting: Rheumatology

## 2018-03-05 ENCOUNTER — Other Ambulatory Visit: Payer: Self-pay | Admitting: *Deleted

## 2018-03-05 ENCOUNTER — Encounter: Payer: Self-pay | Admitting: Physician Assistant

## 2018-03-05 VITALS — BP 120/76 | HR 86 | Resp 15 | Ht 63.0 in | Wt 244.4 lb

## 2018-03-05 DIAGNOSIS — L409 Psoriasis, unspecified: Secondary | ICD-10-CM | POA: Diagnosis not present

## 2018-03-05 DIAGNOSIS — M25561 Pain in right knee: Principal | ICD-10-CM

## 2018-03-05 DIAGNOSIS — Z8639 Personal history of other endocrine, nutritional and metabolic disease: Secondary | ICD-10-CM

## 2018-03-05 DIAGNOSIS — M19071 Primary osteoarthritis, right ankle and foot: Secondary | ICD-10-CM

## 2018-03-05 DIAGNOSIS — Z79899 Other long term (current) drug therapy: Secondary | ICD-10-CM | POA: Diagnosis not present

## 2018-03-05 DIAGNOSIS — G8929 Other chronic pain: Secondary | ICD-10-CM

## 2018-03-05 DIAGNOSIS — M19041 Primary osteoarthritis, right hand: Secondary | ICD-10-CM

## 2018-03-05 DIAGNOSIS — M25562 Pain in left knee: Principal | ICD-10-CM

## 2018-03-05 DIAGNOSIS — L4050 Arthropathic psoriasis, unspecified: Secondary | ICD-10-CM | POA: Diagnosis not present

## 2018-03-05 DIAGNOSIS — M19072 Primary osteoarthritis, left ankle and foot: Secondary | ICD-10-CM

## 2018-03-05 DIAGNOSIS — L52 Erythema nodosum: Secondary | ICD-10-CM | POA: Diagnosis not present

## 2018-03-05 DIAGNOSIS — Z8742 Personal history of other diseases of the female genital tract: Secondary | ICD-10-CM

## 2018-03-05 DIAGNOSIS — M1712 Unilateral primary osteoarthritis, left knee: Secondary | ICD-10-CM

## 2018-03-05 DIAGNOSIS — M19042 Primary osteoarthritis, left hand: Secondary | ICD-10-CM

## 2018-03-05 DIAGNOSIS — M170 Bilateral primary osteoarthritis of knee: Secondary | ICD-10-CM

## 2018-03-05 NOTE — Patient Instructions (Signed)
Standing Labs We placed an order today for your standing lab work.    Please come back and get your standing labs in November and every 3 months   We have open lab Monday through Friday from 8:30-11:30 AM and 1:30-4:00 PM  at the office of Dr. Janell Keeling.   You may experience shorter wait times on Monday and Friday afternoons. The office is located at [address], [address], [place], [place] [address] No appointment is necessary.   Labs are drawn by [facility].  You may receive a bill from [facility] for your lab work. If you have any questions regarding directions or hours of operation,  please call [phone].     

## 2018-03-13 MED FILL — COSENTYX 300 MG DOSE-2 PENS: 150 | 28 days supply | Qty: 2 | Fill #2

## 2018-03-13 NOTE — Progress Notes (Signed)
[name] [address], [place] [address] Phone: [phone] Subjective:    I Tabitha [name] am serving as a Education administrator for Dr. Hulan Saas.    CC: Bilateral knee pain, left shoulder pain follow-up  EVO:[id number]  Tabitha [name] is a 52 y.o. female coming in with complaint of bilateral knee and left shoulder pain. Knees are doing better. Shoulder is improving. Still clicks.  Patient's mother states that the pains are not severe.  Has started physical therapy for the knees and wondering if it would help for the shoulder.  Have seen patient multiple times for her axial skeletal pain as well.  Has responded well to manipulation.  Being treated also for autoimmune and is making progress with her new treatment.      Past Medical History:  Diagnosis Date  . Arthritis   . De Quervain's disease (tenosynovitis)   . Dermatophytosis, scalp   . Diabetes mellitus   . Hypercholesteremia   . Obesity   . Polycystic ovarian disease   . Psoriatic arthritis ([place])   . Wears glasses    Past Surgical History:  Procedure Laterality Date  . CARPAL TUNNEL RELEASE    . COLONOSCOPY    . DE QUERVAIN'S RELEASE  2012   left  . KNEE ARTHROPLASTY    . KNEE ARTHROSCOPY Right 05/06/2013   Procedure: RIGHT KNEE ARTHROSCOPY ;  Surgeon: Hessie Dibble, MD;  Location: [facility];  Service: Orthopedics;  Laterality: Right;  partial lateral minisectomy and chondroplasty  . WISDOM TOOTH EXTRACTION    . WRIST SURGERY  2001   carpel tunnel -rt   Social History   Socioeconomic History  . Marital status: Legally Separated    Spouse name: Not on file  . Number of children: Not on file  . Years of education: Not on file  . Highest education level: Not on file  Occupational History  . 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 Financial resource strain: Not on file  . Food insecurity:    Worry: Not on file    Inability: Not on file  . Transportation needs:    Medical: Not on file    Non-medical: Not on file  Tobacco Use  . Smoking status: Former Smoker    Packs/day: 1.30    Years: 12.00    Pack years: 15.60    Types: Cigarettes    Start date: 06/12/1984    Last attempt to quit: 03/18/1995    Years since quitting: 23.0  . Smokeless tobacco: Never Used  . Tobacco comment: Not interested in returning to smoking.   Substance and Sexual Activity  . Alcohol use: Yes    Comment: rarely  . Drug use: No  . Sexual activity: Yes    Partners: Male    Comment: married  Lifestyle  . Physical activity:    Days per week: Not on file    Minutes per session: Not on file  . Stress: Not on file  Relationships  . Social connections:    Talks on phone: Not on file    Gets together: Not on file    Attends religious service: Not on file    Active member of club or organization: Not on file    Attends meetings of clubs or organizations: Not on file    Relationship status: Not on file  Other Topics Concern  . Not  on file  Social History Narrative  . Not on file   Allergies  Allergen Reactions  . Chocolate Anaphylaxis  . Food     chocolate  . Statins     Intolerant  lft elevation  . Latex Rash   Family History  Problem Relation Age of Onset  . Diabetes Father   . Hypertension Father   . Stroke Father   . Kidney disease Father   . Heart disease Father   . Cancer Mother 18       breast ca died at age 71  . Hypertension Unknown   . Diabetes type II Unknown   . Obesity Unknown   . Obesity Brother   . Hypertension Brother   . Heart disease Maternal Grandmother   . Alzheimer's disease Maternal Grandmother   . Diabetes Paternal Grandmother   . Heart disease Paternal Grandfather     Current Outpatient Medications (Endocrine & Metabolic):  .  SYNJARDY XR 12.10-998 MG TB24, 2 tablets daily.  Marland Kitchen  VICTOZA 18 MG/3ML SOPN, INJECT 1.8 MG INTO THE SKIN AS DIRECTED DAILY.    Current Outpatient  Medications (Analgesics):  Marland Kitchen  Acetaminophen (TYLENOL PO), Take by mouth as needed. Marland Kitchen  aspirin EC 81 MG tablet, Take 81 mg by mouth daily.  Current Outpatient Medications (Hematological):  .  folic acid (FOLVITE) 1 MG tablet, Take 1 tablet (1 mg total) by mouth 2 (two) times daily.  Current Outpatient Medications (Other):  Marland Kitchen  ACCU-CHEK GUIDE test strip,  .  Cholecalciferol (VITAMIN D) 2000 units CAPS, Take by mouth daily. .  clobetasol cream (TEMOVATE) 1.94 %, Apply 1 application topically 2 (two) times daily. As directed; never apply to face. .  Diclofenac Sodium (PENNSAID) 2 % SOLN, Place 2 application onto the skin 2 (two) times daily. .  methotrexate 50 MG/2ML injection, INJECT 0.6 ML SUBCUTANEOUSLY EVERY FRIDAY .  MILK THISTLE-DAND-FENNEL-LICOR PO, Take 1 capsule by mouth daily. .  Omega-3 Fatty Acids (FISH OIL) 1200 MG CAPS, Take 1-2 capsules (1,200-2,400 mg total) by mouth 2 (two) times daily. Takes 2400 mg qam and 1200 mg qpm .  Secukinumab (COSENTYX SENSOREADY 300 DOSE) 150 MG/ML SOAJ, Inject 150 mg into the skin every 14 (fourteen) days. .  TURMERIC PO, Take by mouth 2 (two) times daily.  Marland Kitchen  UNIFINE PENTIPS 32G X 4 MM MISC,  .  Vitamin D, Ergocalciferol, (DRISDOL) 50000 units CAPS capsule, Take 1 capsule (50,000 Units total) by mouth every 7 (seven) days. (Patient not taking: Reported on 03/14/2018)    Past medical history, social, surgical and family history all reviewed in electronic medical record.  No pertanent information unless stated regarding to the chief complaint.   Review of Systems:  No headache, visual changes, nausea, vomiting, diarrhea, constipation, dizziness, abdominal pain, skin rash, fevers, chills, night sweats, weight loss, swollen lymph nodes, body aches, joint swelling,  chest pain, shortness of breath, mood changes.  Positive muscle aches  Objective  Blood pressure 134/82, pulse 88, height 5\' 3"  (1.6 m), weight 243 lb (110.2 kg), SpO2 98 %.   General: No  apparent distress alert and oriented x3 mood and affect normal, dressed appropriately.  HEENT: Pupils equal, extraocular movements intact  Respiratory: Patient's speak in full sentences and does not appear short of breath  Cardiovascular: No lower extremity edema, non tender, no erythema  Skin: Warm dry intact with no signs of infection or rash on extremities or on axial skeleton.  Abdomen: Soft nontender  Neuro: Cranial nerves  II through XII are intact, neurovascularly intact in all extremities with 2+ DTRs and 2+ pulses.  Lymph: No lymphadenopathy of posterior or anterior cervical chain or axillae bilaterally.  Gait normal with good balance and coordination.  MSK:  Non tender with full range of motion and good stability and symmetric strength and tone of , elbows, wrist, hip, and ankles bilaterally.  Left shoulder exam shows the patient does have mild positive impingement.  Knee: Bilateral valgus deformity noted.  Abnormal thigh to calf ratio.  Tender to palpation over medial and PF joint line.  ROM full in flexion and extension and lower leg rotation.  Decreased range of motion of the right knee lacking last 10 degrees of extension instability with valgus force.  painful patellar compression. Patellar glide with moderate crepitus. Patellar and quadriceps tendons unremarkable. Hamstring and quadriceps strength is normal.  Osteopathic findings C2 flexed rotated and side bent right T3 extended rotated and side bent right inhaled third rib T9 extended rotated and side bent left L2 flexed rotated and side bent right Sacrum right on right    Impression and Recommendations:     This case required medical decision making of moderate complexity. The above documentation has been reviewed and is accurate and complete Lyndal Pulley, DO       Note: This dictation was prepared with Dragon dictation along with smaller phrase technology. Any transcriptional errors that result from this  process are unintentional.

## 2018-03-14 ENCOUNTER — Encounter: Payer: Self-pay | Admitting: Family Medicine

## 2018-03-14 ENCOUNTER — Ambulatory Visit: Payer: No Typology Code available for payment source | Admitting: Family Medicine

## 2018-03-14 VITALS — BP 134/82 | HR 88 | Ht 63.0 in | Wt 243.0 lb

## 2018-03-14 DIAGNOSIS — M7522 Bicipital tendinitis, left shoulder: Secondary | ICD-10-CM | POA: Diagnosis not present

## 2018-03-14 DIAGNOSIS — G8929 Other chronic pain: Secondary | ICD-10-CM

## 2018-03-14 DIAGNOSIS — M999 Biomechanical lesion, unspecified: Secondary | ICD-10-CM | POA: Diagnosis not present

## 2018-03-14 DIAGNOSIS — M25512 Pain in left shoulder: Secondary | ICD-10-CM | POA: Diagnosis not present

## 2018-03-14 NOTE — Assessment & Plan Note (Signed)
Decision today to treat with OMT was based on Physical Exam  After verbal consent patient was treated with HVLA, ME, FPR techniques in cervical, thoracic, rib, lumbar and sacral areas  Patient tolerated the procedure well with improvement in symptoms  Patient given exercises, stretches and lifestyle modifications  See medications in patient instructions if given  Patient will follow up in 4 weeks 

## 2018-03-14 NOTE — Assessment & Plan Note (Signed)
Stable.  Mild impingement.  Continue to monitor.

## 2018-03-14 NOTE — Patient Instructions (Signed)
Great to see you  Tabitha [name] is your friend Stay active We will get you in with PT as well  I am impressed otherwise I think I need to se eyou every 2-3 months for manipulation

## 2018-03-27 MED FILL — UNIFINE PENTIPS 31GX3/16: 31G X 5 MM | 90 days supply | Qty: 100 | Fill #2

## 2018-03-27 MED FILL — VICTOZA 18 MG/3 ML INJECT P: 18 | 30 days supply | Qty: 9 | Fill #0

## 2018-03-27 MED FILL — UNIFINE PENTIPS 31GX3/16": 31G X 5 MM | 90 days supply | Qty: 100 | Fill #2

## 2018-04-02 ENCOUNTER — Encounter: Payer: Self-pay | Admitting: Family Medicine

## 2018-04-03 ENCOUNTER — Other Ambulatory Visit: Payer: Self-pay | Admitting: Internal Medicine

## 2018-04-07 LAB — LIPID PANEL
Cholesterol: 191 (ref 0–200)
HDL: 140 — AB (ref 35–70)
LDL Cholesterol: 108
Triglycerides: 161 — AB (ref 40–160)

## 2018-04-07 LAB — HEMOGLOBIN A1C: Hemoglobin A1C: 6.1

## 2018-04-10 ENCOUNTER — Encounter: Payer: Self-pay | Admitting: Internal Medicine

## 2018-04-10 NOTE — Progress Notes (Signed)
Abstracted and sent to scan  

## 2018-04-11 MED FILL — SYNJARDY XR 12.5-1,000 MG T: 12.5-1000 | 90 days supply | Qty: 180 | Fill #3

## 2018-04-11 MED FILL — COSENTYX 300 MG DOSE-2 PENS: 150 | 28 days supply | Qty: 2 | Fill #2

## 2018-04-22 ENCOUNTER — Ambulatory Visit (INDEPENDENT_AMBULATORY_CARE_PROVIDER_SITE_OTHER): Payer: No Typology Code available for payment source | Admitting: Internal Medicine

## 2018-04-22 ENCOUNTER — Encounter: Payer: Self-pay | Admitting: Internal Medicine

## 2018-04-22 DIAGNOSIS — R6889 Other general symptoms and signs: Secondary | ICD-10-CM | POA: Diagnosis not present

## 2018-04-22 MED ORDER — NEOMYCIN-POLYMYXIN-HC 3.5-10000-1 OT SUSP: 3.0000 [drp] | Freq: Three times a day (TID) | OTIC | 0 refills | Status: DC

## 2018-04-22 MED FILL — NEO/POLYMYXIN/HC EAR SOLN: 3.5-10000-1 | 22 days supply | Qty: 10 | Fill #0

## 2018-04-22 NOTE — Patient Instructions (Signed)
We have sent in the ear drops to use 3 drops 3 times per day for 3 days to see if this helps.

## 2018-04-22 NOTE — Assessment & Plan Note (Signed)
Some bulging of the right TM with clear fluid could be related to cosentyx as this is associated with sinus inflammation. No signs of infection today. Rx for cortisporin ear drops to use to see if this improves. If no improvement will send to ENT.

## 2018-04-22 NOTE — Progress Notes (Signed)
   Subjective:    Patient ID: Tabitha [name], female    DOB: [birth year], 52 y.o.   MRN: [id number]  HPI The patient is a 52 YO female coming in for some right ear tapping or throbbing sound. She has noticed this off and on for the last week. It is consistent most days and sometimes several times per day. Usually lasts hours rather than minutes. Worse in the evening and lasts the longest then until she falls asleep. She denies increase in sinus symptoms or nose drainage. She has had a couple sinus headaches in the last 2 weeks but took sudafed and this helped that but not the throbbing. She rates it about 250-300 beats per minutes so does not think related to HR. 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 Denies pain in the ear or discharge. Denies hearing changes in either ear. Does have psoriatic arthritis and takes cosentyx. She has rare psoriasis ear canal but none bothersome now.   Review of Systems  Constitutional: Negative.   HENT: Positive for tinnitus. Negative for congestion, ear discharge, ear pain, nosebleeds, postnasal drip, sinus pressure, sinus pain and sneezing.   Eyes: Negative.   Respiratory: Negative for cough, chest tightness and shortness of breath.   Cardiovascular: Negative for chest pain, palpitations and leg swelling.  Gastrointestinal: Negative for abdominal distention, abdominal pain, constipation, diarrhea, nausea and vomiting.  Musculoskeletal: Negative.   Skin: Negative.   Neurological: Positive for headaches.  Psychiatric/Behavioral: Negative.       Objective:   Physical Exam  Constitutional: She is oriented to person, place, and time. She appears well-developed and well-nourished.  HENT:  Head: Normocephalic and atraumatic.  Right TM bulging clear fluid with some inflammation in the canal. Left TM and ear canal normal with mild psoriasis at the opening.   Eyes: EOM are normal.  Neck: Normal range of motion.  Cardiovascular: Normal rate and regular rhythm.   Pulmonary/Chest: Effort normal and breath sounds normal. No respiratory distress. She has no wheezes. She has no rales.  Abdominal: Soft. Bowel sounds are normal. She exhibits no distension. There is no tenderness. There is no rebound.  Musculoskeletal: She exhibits no edema.  Neurological: She is alert and oriented to person, place, and time. Coordination normal.  Skin: Skin is warm and dry.  Psychiatric: She has a normal mood and affect.   Vitals:   04/22/18 1532  BP: 124/62  Pulse: 89  Temp: 98.5 F (36.9 C)  TempSrc: Oral  SpO2: 97%  Weight: 243 lb (110.2 kg)  Height: 5\' 3"  (1.6 m)      Assessment & Plan:

## 2018-04-25 MED FILL — METHOTREXATE 25 MG/ML VIAL: 50 | 23 days supply | Qty: 2 | Fill #1

## 2018-04-28 MED FILL — VICTOZA 18 MG/3 ML INJECT P: 18 | 30 days supply | Qty: 9 | Fill #1

## 2018-05-01 ENCOUNTER — Other Ambulatory Visit: Payer: Self-pay | Admitting: Internal Medicine

## 2018-05-02 ENCOUNTER — Other Ambulatory Visit: Payer: Self-pay | Admitting: Rheumatology

## 2018-05-02 ENCOUNTER — Other Ambulatory Visit: Payer: Self-pay | Admitting: Pharmacist

## 2018-05-02 ENCOUNTER — Other Ambulatory Visit: Payer: Self-pay | Admitting: Internal Medicine

## 2018-05-02 MED ORDER — SECUKINUMAB (300 MG DOSE) 150 MG/ML ~~LOC~~ SOAJ: 150.0000 mg | SUBCUTANEOUS | 0 refills | Status: DC

## 2018-05-02 NOTE — Telephone Encounter (Addendum)
Last visit: 03/05/18 Next Visit: 07/15/18 Labs: 01/11/18 stable TB Gold: 01/11/18 Neg   Patient advised she is due for labs. Patient will update 05/03/18  Okay to refill 30 day supply per Dr. Estanislado Pandy

## 2018-05-03 ENCOUNTER — Other Ambulatory Visit: Payer: Self-pay | Admitting: Physician Assistant

## 2018-05-03 DIAGNOSIS — Z79899 Other long term (current) drug therapy: Secondary | ICD-10-CM

## 2018-05-03 LAB — COMPLETE METABOLIC PANEL WITH GFR
AG Ratio: 1.6 (calc) (ref 1.0–2.5)
ALT: 24 U/L (ref 6–29)
AST: 16 U/L (ref 10–35)
Albumin: 4.1 g/dL (ref 3.6–5.1)
Alkaline phosphatase (APISO): 70 U/L (ref 33–130)
BUN: 24 mg/dL (ref 7–25)
CO2: 26 mmol/L (ref 20–32)
Calcium: 9.6 mg/dL (ref 8.6–10.4)
Chloride: 103 mmol/L (ref 98–110)
Creat: 0.81 mg/dL (ref 0.50–1.05)
GFR, Est African American: 96 mL/min/{1.73_m2} (ref >=60)
GFR, Est Non African American: 83 mL/min/{1.73_m2} (ref >=60)
Globulin: 2.5 g/dL (calc) (ref 1.9–3.7)
Glucose, Bld: 125 mg/dL — ABNORMAL HIGH (ref 65–99)
Potassium: 4 mmol/L (ref 3.5–5.3)
Sodium: 139 mmol/L (ref 135–146)
Total Bilirubin: 0.3 mg/dL (ref 0.2–1.2)
Total Protein: 6.6 g/dL (ref 6.1–8.1)

## 2018-05-03 LAB — CBC WITH DIFFERENTIAL/PLATELET
Basophils Absolute: 73 cells/uL (ref 0–200)
Basophils Relative: 0.7 %
Eosinophils Absolute: 218 cells/uL (ref 15–500)
Eosinophils Relative: 2.1 %
HCT: 41 % (ref 35.0–45.0)
Hemoglobin: 13.8 g/dL (ref 11.7–15.5)
Lymphs Abs: 1924 cells/uL (ref 850–3900)
MCH: 30.7 pg (ref 27.0–33.0)
MCHC: 33.7 g/dL (ref 32.0–36.0)
MCV: 91.3 fL (ref 80.0–100.0)
MPV: 11.6 fL (ref 7.5–12.5)
Monocytes Relative: 8.3 %
Neutro Abs: 7322 cells/uL (ref 1500–7800)
Neutrophils Relative %: 70.4 %
Platelets: 237 10*3/uL (ref 140–400)
RBC: 4.49 10*6/uL (ref 3.80–5.10)
RDW: 12.8 % (ref 11.0–15.0)
Total Lymphocyte: 18.5 %
WBC mixed population: 863 cells/uL (ref 200–950)
WBC: 10.4 10*3/uL (ref 3.8–10.8)

## 2018-05-06 NOTE — Progress Notes (Signed)
Glucose is 125. All other labs are WNL.

## 2018-05-07 MED FILL — ACCU-CHEK GUIDE TEST STRIP: 90 days supply | Qty: 200 | Fill #1

## 2018-05-07 MED FILL — FOLIC ACID 1 MG TABS: 1 | 90 days supply | Qty: 180 | Fill #1

## 2018-05-08 MED FILL — COSENTYX 300 MG DOSE-2 PENS: 150 | 28 days supply | Qty: 2 | Fill #0

## 2018-05-15 ENCOUNTER — Encounter: Payer: Self-pay | Admitting: Family Medicine

## 2018-05-15 ENCOUNTER — Ambulatory Visit: Payer: No Typology Code available for payment source | Admitting: Family Medicine

## 2018-05-15 VITALS — BP 120/78 | HR 92 | Ht 63.0 in | Wt 234.0 lb

## 2018-05-15 DIAGNOSIS — G8929 Other chronic pain: Secondary | ICD-10-CM | POA: Diagnosis not present

## 2018-05-15 DIAGNOSIS — M545 Low back pain: Secondary | ICD-10-CM | POA: Diagnosis not present

## 2018-05-15 DIAGNOSIS — M999 Biomechanical lesion, unspecified: Secondary | ICD-10-CM

## 2018-05-15 NOTE — Patient Instructions (Addendum)
Great to see you  Tabitha [name] is your friend' Stay active Happy holidays!  See me again in 4 weeks

## 2018-05-15 NOTE — Assessment & Plan Note (Signed)
Back pain noted.  We discussed with patient about posture and ergonomics.  Discussed which activities to do which was to avoid.  We discussed avoiding certain activities.  Discussed patient's underlying autoimmune disease can also cause some flares.  Follow-up with me again in 4 to 6 weeks

## 2018-05-15 NOTE — Assessment & Plan Note (Signed)
Decision today to treat with OMT was based on Physical Exam  After verbal consent patient was treated with HVLA, ME, FPR techniques in cervical, thoracic, rib lumbar and sacral areas  Patient tolerated the procedure well with improvement in symptoms  Patient given exercises, stretches and lifestyle modifications  See medications in patient instructions if given  Patient will follow up in 5-6 weeks 

## 2018-05-15 NOTE — Progress Notes (Signed)
[name] [address] [place], [place] [address] Phone: [phone] Subjective:     CC: Back and shoulder pain follow-up  BJY:[id number]  Tabitha [name] is a 52 y.o. female coming in with complaint of knee, back and shoulder pain. Has been doing physical therapy and this is decreasing her knee pain. Also has been getting massages which have helped decrease posterior chain tightness.   Patient also states that shoulder pain has decreased. She does have some clicking in the joint but otherwise is doing well.   Patient is also having pain on right thoracolumbar area with forward flexion.    Patient states overall has been doing relatively well.  Does have some stress.  Is going through a divorce.    Past Medical History:  Diagnosis Date  . Arthritis   . De Quervain's disease (tenosynovitis)   . Dermatophytosis, scalp   . Diabetes mellitus   . Hypercholesteremia   . Obesity   . Polycystic ovarian disease   . Psoriatic arthritis ([place])   . Wears glasses    Past Surgical History:  Procedure Laterality Date  . CARPAL TUNNEL RELEASE    . COLONOSCOPY    . DE QUERVAIN'S RELEASE  2012   left  . KNEE ARTHROPLASTY    . KNEE ARTHROSCOPY Right 05/06/2013   Procedure: RIGHT KNEE ARTHROSCOPY ;  Surgeon: Hessie Dibble, MD;  Location: [facility];  Service: Orthopedics;  Laterality: Right;  partial lateral minisectomy and chondroplasty  . WISDOM TOOTH EXTRACTION    . WRIST SURGERY  2001   carpel tunnel -rt   Social History   Socioeconomic History  . Marital status: Legally Separated    Spouse name: Not on file  . Number of children: Not on file  . Years of education: Not on file  . Highest education level: Not on file  Occupational History  . 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 Financial resource strain: Not on file  . Food insecurity:    Worry: Not on file    Inability: Not  on file  . Transportation needs:    Medical: Not on file    Non-medical: Not on file  Tobacco Use  . Smoking status: Former Smoker    Packs/day: 1.30    Years: 12.00    Pack years: 15.60    Types: Cigarettes    Start date: 06/12/1984    Last attempt to quit: 03/18/1995    Years since quitting: 23.1  . Smokeless tobacco: Never Used  . Tobacco comment: Not interested in returning to smoking.   Substance and Sexual Activity  . Alcohol use: Yes    Comment: rarely  . Drug use: No  . Sexual activity: Yes    Partners: Male    Comment: married  Lifestyle  . Physical activity:    Days per week: Not on file    Minutes per session: Not on file  . Stress: Not on file  Relationships  . Social connections:    Talks on phone: Not on file    Gets together: Not on file    Attends religious service: Not on file    Active member of club or organization: Not on file    Attends meetings of clubs or organizations: Not on file    Relationship status: Not on file  Other Topics Concern  . Not on file  Social  History Narrative  . Not on file   Allergies  Allergen Reactions  . Chocolate Anaphylaxis  . Food     chocolate  . Statins     Intolerant  lft elevation  . Latex Rash   Family History  Problem Relation Age of Onset  . Diabetes Father   . Hypertension Father   . Stroke Father   . Kidney disease Father   . Heart disease Father   . Cancer Mother 49       breast ca died at age 70  . Hypertension Unknown   . Diabetes type II Unknown   . Obesity Unknown   . Obesity Brother   . Hypertension Brother   . Heart disease Maternal Grandmother   . Alzheimer's disease Maternal Grandmother   . Diabetes Paternal Grandmother   . Heart disease Paternal Grandfather     Current Outpatient Medications (Endocrine & Metabolic):  .  SYNJARDY XR 12.10-998 MG TB24, 2 tablets daily.  Marland Kitchen  VICTOZA 18 MG/3ML SOPN, INJECT 1.8 MG INTO THE SKIN AS DIRECTED DAILY.    Current Outpatient Medications  (Analgesics):  Marland Kitchen  Acetaminophen (TYLENOL PO), Take by mouth as needed. Marland Kitchen  aspirin EC 81 MG tablet, Take 81 mg by mouth daily.  Current Outpatient Medications (Hematological):  .  folic acid (FOLVITE) 1 MG tablet, Take 1 tablet (1 mg total) by mouth 2 (two) times daily.  Current Outpatient Medications (Other):  Marland Kitchen  ACCU-CHEK GUIDE test strip,  .  Cholecalciferol (VITAMIN D) 2000 units CAPS, Take by mouth daily. .  clobetasol cream (TEMOVATE) 0.48 %, Apply 1 application topically 2 (two) times daily. As directed; never apply to face. .  Diclofenac Sodium (PENNSAID) 2 % SOLN, Place 2 application onto the skin 2 (two) times daily. .  methotrexate 50 MG/2ML injection, INJECT 0.6 ML SUBCUTANEOUSLY EVERY FRIDAY .  MILK THISTLE-DAND-FENNEL-LICOR PO, Take 1 capsule by mouth daily. Marland Kitchen  neomycin-polymyxin-hydrocortisone (CORTISPORIN) 3.5-10000-1 OTIC suspension, Place 3 drops into the right ear 3 (three) times daily. .  Omega-3 Fatty Acids (FISH OIL) 1200 MG CAPS, Take 1-2 capsules (1,200-2,400 mg total) by mouth 2 (two) times daily. Takes 2400 mg qam and 1200 mg qpm .  Secukinumab, 300 MG Dose, (COSENTYX SENSOREADY, 300 MG,) 150 MG/ML SOAJ, Inject 150 mg into the skin every 14 (fourteen) days. .  TURMERIC PO, Take by mouth 2 (two) times daily.  Marland Kitchen  UNIFINE PENTIPS 32G X 4 MM MISC,     Past medical history, social, surgical and family history all reviewed in electronic medical record.  No pertanent information unless stated regarding to the chief complaint.   Review of Systems:  No headache, visual changes, nausea, vomiting, diarrhea, constipation, dizziness, abdominal pain, skin rash, fevers, chills, night sweats, weight loss, swollen lymph nodes, body aches, , chest pain, shortness of breath, mood changes.  Positive muscle aches and joint swelling  Objective  Blood pressure 120/78, pulse 92, height 5\' 3"  (1.6 m), weight 234 lb (106.1 kg), SpO2 97 %.    General: No apparent distress alert and  oriented x3 mood and affect normal, dressed appropriately.  HEENT: Pupils equal, extraocular movements intact  Respiratory: Patient's speak in full sentences and does not appear short of breath  Cardiovascular: No lower extremity edema, non tender, no erythema  Skin: Warm dry intact with no signs of infection or rash on extremities or on axial skeleton.  Abdomen: Soft nontender  Neuro: Cranial nerves II through XII are intact, neurovascularly intact in all  extremities with 2+ DTRs and 2+ pulses.  Lymph: No lymphadenopathy of posterior or anterior cervical chain or axillae bilaterally.  Gait normal with good balance and coordination.  MSK:  Non tender with full range of motion and good stability and symmetric strength and tone of shoulders, elbows, wrist, hip, knee and ankles bilaterally.  Swelling noted of multiple joints especially the knees Back Exam:  Inspection: Mild increase in kyphosis of the upper back and loss of lordosis of the lumbar spine Motion: Flexion 45 deg, Extension 25 deg, Side Bending to 35 deg bilaterally,  Rotation to 30 deg bilaterally  SLR laying: Negative  XSLR laying: Negative  Palpable tenderness: Tender to palpation the paraspinal musculature lumbar spine right greater than left. FABER: Positive Faber. Sensory change: Gross sensation intact to all lumbar and sacral dermatomes.  Reflexes: 2+ at both patellar tendons, 2+ at achilles tendons, Babinski's downgoing.  Strength at foot  Plantar-flexion: 5/5 Dorsi-flexion: 5/5 Eversion: 5/5 Inversion: 5/5  Leg strength  Quad: 5/5 Hamstring: 5/5 Hip flexor: 5/5 Hip abductors: 4/5 but symmetric Gait unremarkable.    Osteopathic findings C2 flexed rotated and side bent right C6 flexed rotated and side bent left T5 extended rotated and side bent right inhaled rib T5 extended rotated and side bent left L2 flexed rotated and side bent right Sacrum right on right    Impression and Recommendations:     This case  required medical decision making of moderate complexity. The above documentation has been reviewed and is accurate and complete Lyndal Pulley, DO       Note: This dictation was prepared with Dragon dictation along with smaller phrase technology. Any transcriptional errors that result from this process are unintentional.

## 2018-05-16 ENCOUNTER — Other Ambulatory Visit: Payer: Self-pay | Admitting: Rheumatology

## 2018-05-16 DIAGNOSIS — L4050 Arthropathic psoriasis, unspecified: Secondary | ICD-10-CM

## 2018-05-16 MED FILL — METHOTREXATE 25 MG/ML VIAL: 50 | 23 days supply | Qty: 2 | Fill #0

## 2018-05-16 NOTE — Telephone Encounter (Signed)
Last visit: 03/05/18 Next Visit: 07/15/18 Labs: 05/03/18 Glucose is 125. All other labs are WNL.

## 2018-05-24 IMAGING — MG 2D DIGITAL SCREENING BILATERAL MAMMOGRAM WITH CAD AND ADJUNCT TO
8 of 13 series · 8 of 29 positions shown · non-contrast
Comparison: None.

CLINICAL DATA: Screening. History of benign right breast biopsy in
8467.

EXAM:
2D DIGITAL SCREENING BILATERAL MAMMOGRAM WITH CAD AND ADJUNCT TOMO

[L CV]
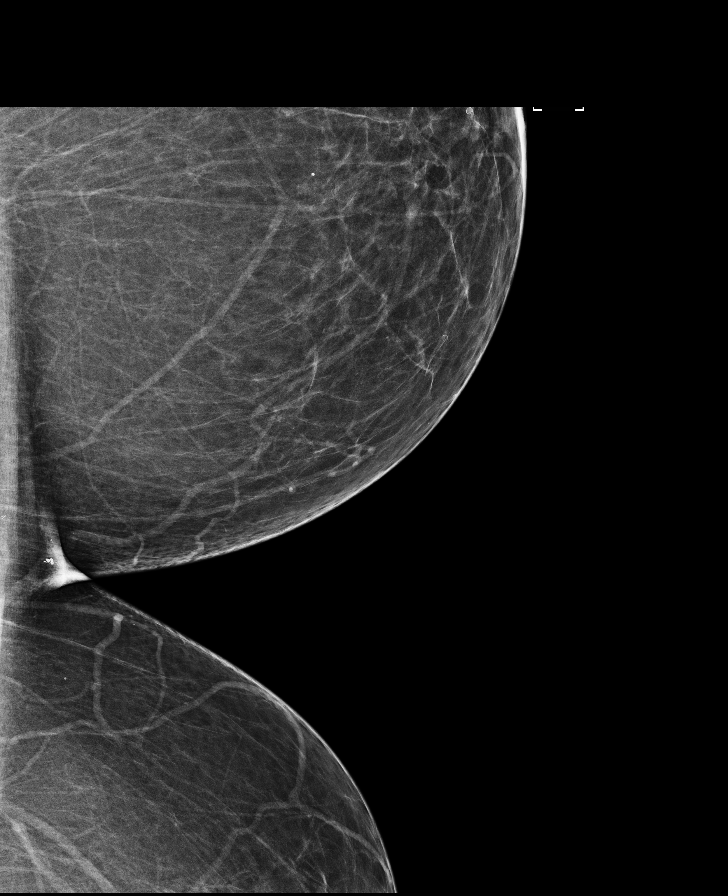

[R CC]
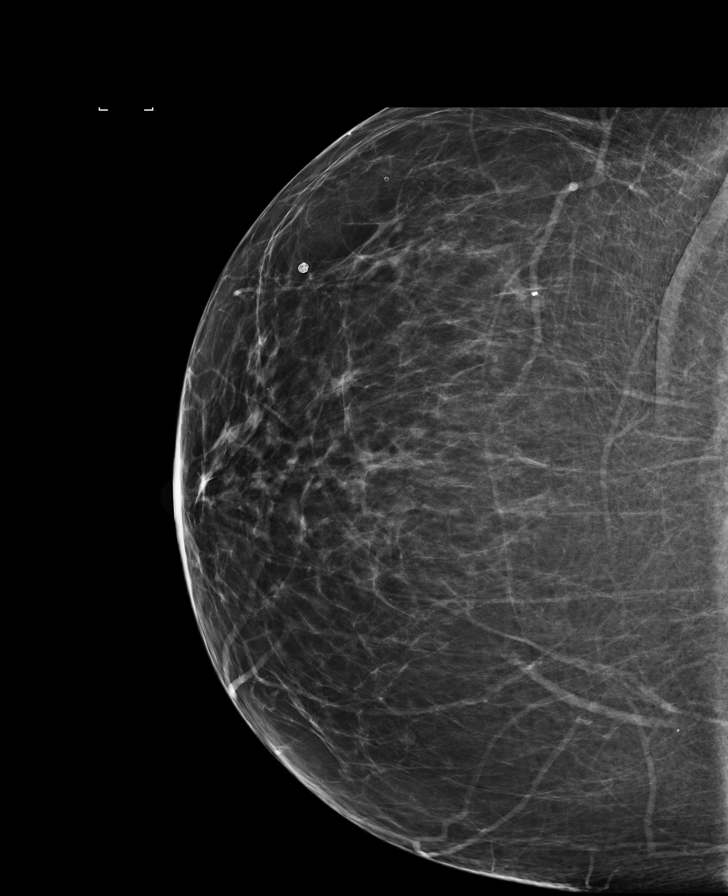

[L CC]
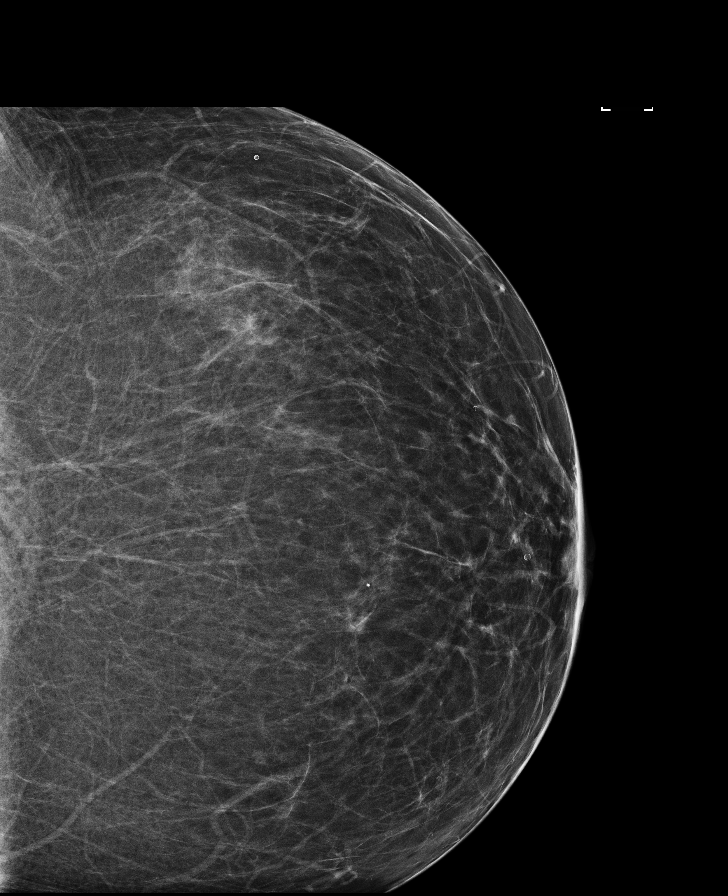

[L MLO]
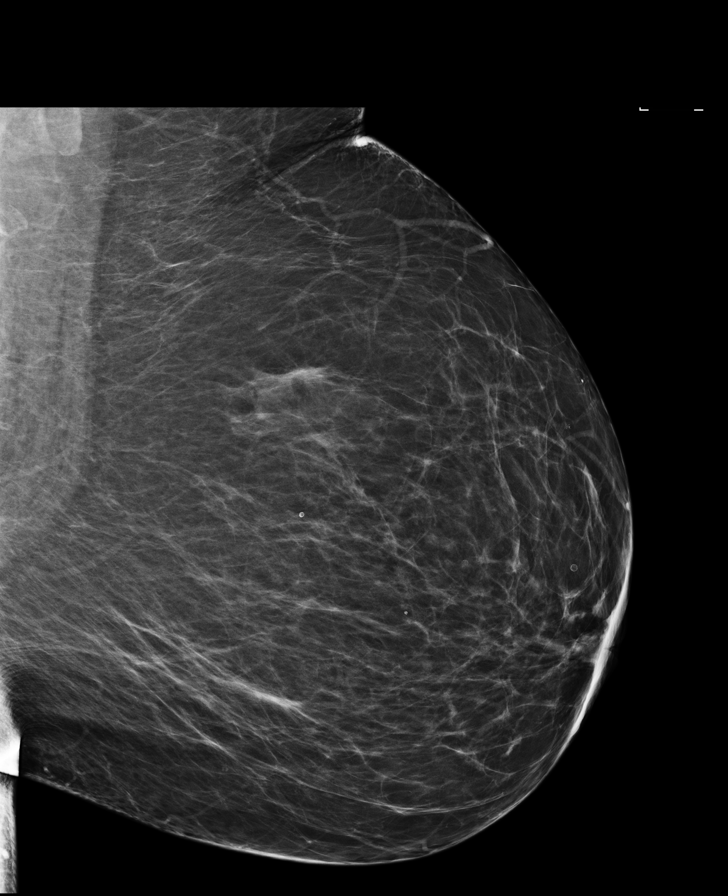

[R MLO synth-2D]
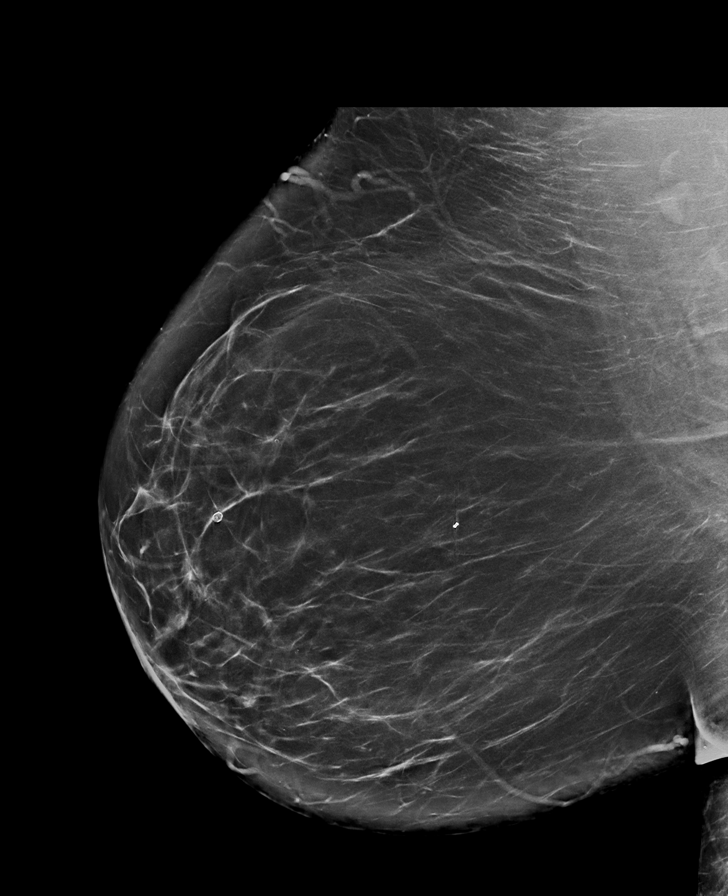

[R MLO]
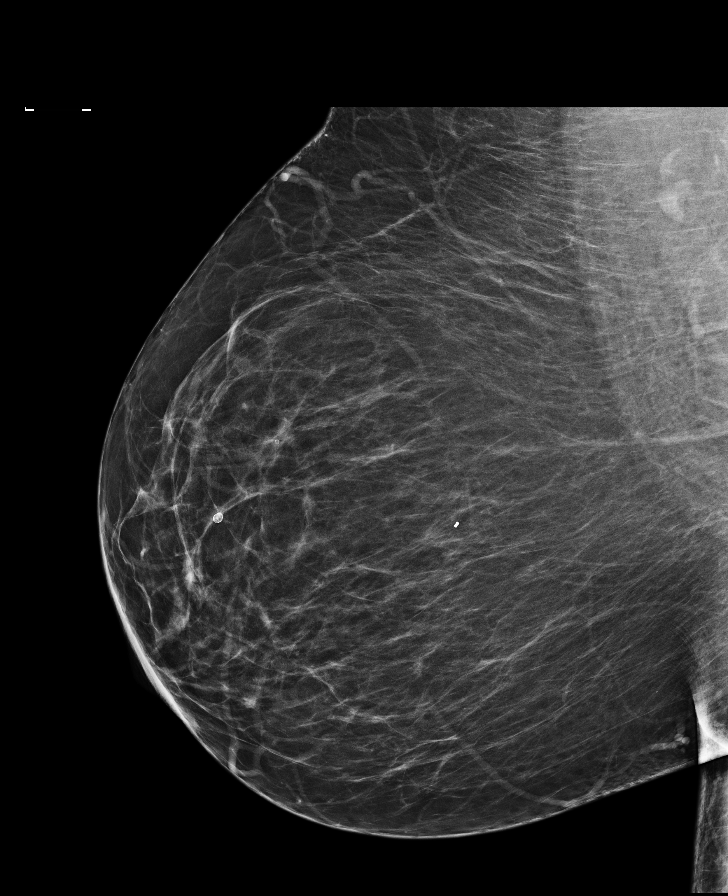

[L MLO synth-2D]
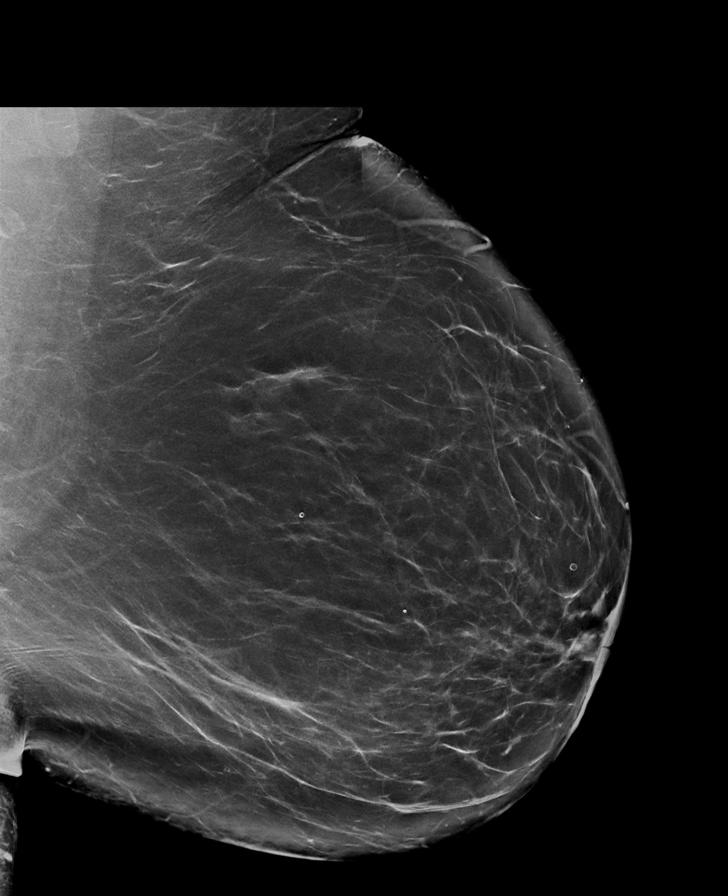

[L CC synth-2D]
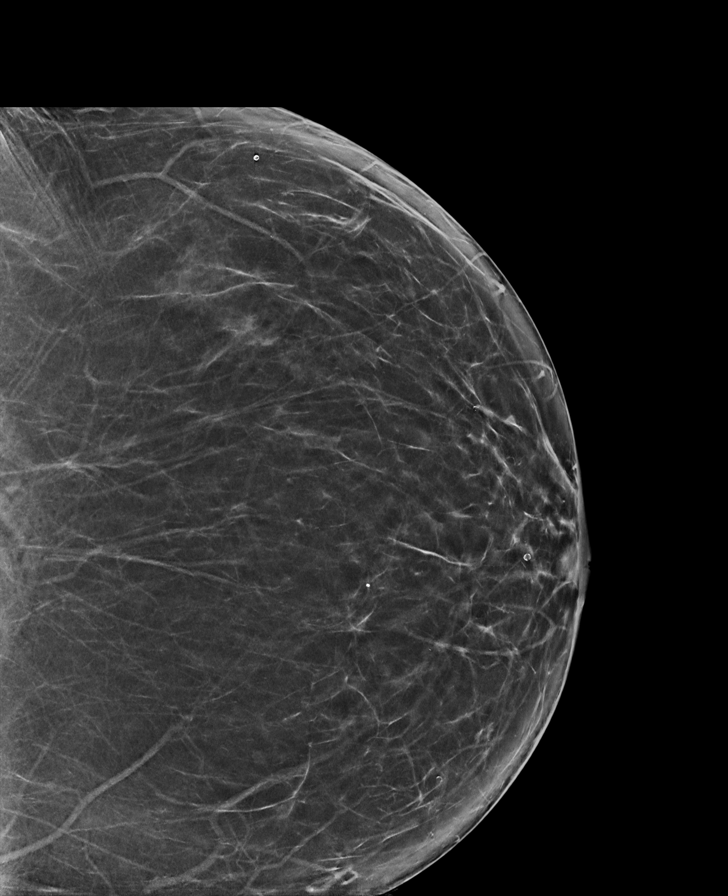

[8 of 29 positions shown; findings below may reference images not displayed]

ACR Breast Density Category b: There are scattered areas of
fibroglandular density.
FINDINGS: Biopsy site marker within the right breast is stable in position.
There are no findings suspicious for malignancy within either
breast. Images were processed with CAD.
IMPRESSION: No mammographic evidence of malignancy. A result letter of this
screening mammogram will be mailed directly to the patient.

RECOMMENDATION:
Screening mammogram in one year. (Code:WX-6-56R)

BI-RADS CATEGORY  2: Benign.

## 2018-05-28 LAB — HM DIABETES EYE EXAM

## 2018-05-29 ENCOUNTER — Other Ambulatory Visit: Payer: Self-pay | Admitting: Rheumatology

## 2018-05-29 ENCOUNTER — Other Ambulatory Visit: Payer: Self-pay | Admitting: Pharmacist

## 2018-05-29 MED ORDER — SECUKINUMAB (300 MG DOSE) 150 MG/ML ~~LOC~~ SOAJ: 150.0000 mg | SUBCUTANEOUS | 0 refills | Status: DC

## 2018-05-29 NOTE — Telephone Encounter (Signed)
Last visit: 03/05/18 Next Visit: 07/15/18 Labs: 05/03/18 Glucose is 125. All other labs are WNL. TB Gold: 01/11/18 Neg   Okay to refill per Dr. Estanislado Pandy

## 2018-06-03 MED FILL — VICTOZA 18 MG/3 ML INJECT P: 18 | 30 days supply | Qty: 9 | Fill #0

## 2018-06-03 MED FILL — COSENTYX 300 MG DOSE-2 PENS: 150 | 28 days supply | Qty: 2 | Fill #0

## 2018-06-12 ENCOUNTER — Other Ambulatory Visit: Payer: Self-pay | Admitting: Rheumatology

## 2018-06-12 ENCOUNTER — Encounter: Payer: Self-pay | Admitting: Rheumatology

## 2018-06-12 DIAGNOSIS — L4050 Arthropathic psoriasis, unspecified: Secondary | ICD-10-CM

## 2018-06-14 MED FILL — METHOTREXATE 25 MG/ML VIAL: 50 | 69 days supply | Qty: 6 | Fill #1

## 2018-06-17 ENCOUNTER — Ambulatory Visit: Payer: No Typology Code available for payment source | Admitting: Family Medicine

## 2018-06-17 ENCOUNTER — Encounter: Payer: Self-pay | Admitting: Family Medicine

## 2018-06-17 VITALS — BP 100/82 | HR 70 | Ht 63.0 in | Wt 233.0 lb

## 2018-06-17 DIAGNOSIS — M999 Biomechanical lesion, unspecified: Secondary | ICD-10-CM | POA: Diagnosis not present

## 2018-06-17 DIAGNOSIS — L4050 Arthropathic psoriasis, unspecified: Secondary | ICD-10-CM

## 2018-06-17 NOTE — Patient Instructions (Addendum)
Good to see you  Tabitha [name] is your friend Keep it up! I am so happy for you! Lets push it to 45-6 weeks but call sooner if knees act up

## 2018-06-17 NOTE — Assessment & Plan Note (Signed)
Doing fairly well overall.  Patient will continue following up with the rheumatologist.  Doing well with the medications.  Encouraged weight loss.  Responding well to manipulation.  Follow-up again in 6 weeks

## 2018-06-17 NOTE — Progress Notes (Signed)
[name] [address] [place], [place] [address] Phone: [phone] Subjective:    I Tabitha [name] am serving as a Education administrator for Dr. Hulan Saas.   CC: Back pain and knee pain follow-up  KPT:[id number]  Tabitha [name] is a 53 y.o. female coming in with complaint of back pain. States the back is doing well.  Known degenerative disc disease.  History of psoriatic arthritis.  Doing much better on different medications for the psoriatic arthritis and feels like she is making improvement.  Has also lost 40 pounds over the course last several months and feels like it is making a difference is as well.  Knees are in a little pain. Pops.       Past Medical History:  Diagnosis Date  . Arthritis   . De Quervain's disease (tenosynovitis)   . Dermatophytosis, scalp   . Diabetes mellitus   . Hypercholesteremia   . Obesity   . Polycystic ovarian disease   . Psoriatic arthritis ([place])   . Wears glasses    Past Surgical History:  Procedure Laterality Date  . CARPAL TUNNEL RELEASE    . COLONOSCOPY    . DE QUERVAIN'S RELEASE  2012   left  . KNEE ARTHROPLASTY    . KNEE ARTHROSCOPY Right 05/06/2013   Procedure: RIGHT KNEE ARTHROSCOPY ;  Surgeon: Hessie Dibble, MD;  Location: [facility];  Service: Orthopedics;  Laterality: Right;  partial lateral minisectomy and chondroplasty  . WISDOM TOOTH EXTRACTION    . WRIST SURGERY  2001   carpel tunnel -rt   Social History   Socioeconomic History  . Marital status: Legally Separated    Spouse name: Not on file  . Number of children: Not on file  . Years of education: Not on file  . Highest education level: Not on file  Occupational History  . 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 Financial resource strain: Not on file  . Food insecurity:    Worry: Not on file    Inability: Not on file  . Transportation needs:    Medical: Not on file   Non-medical: Not on file  Tobacco Use  . Smoking status: Former Smoker    Packs/day: 1.30    Years: 12.00    Pack years: 15.60    Types: Cigarettes    Start date: 06/12/1984    Last attempt to quit: 03/18/1995    Years since quitting: 23.2  . Smokeless tobacco: Never Used  . Tobacco comment: Not interested in returning to smoking.   Substance and Sexual Activity  . Alcohol use: Yes    Comment: rarely  . Drug use: No  . Sexual activity: Yes    Partners: Male    Comment: married  Lifestyle  . Physical activity:    Days per week: Not on file    Minutes per session: Not on file  . Stress: Not on file  Relationships  . Social connections:    Talks on phone: Not on file    Gets together: Not on file    Attends religious service: Not on file    Active member of club or organization: Not on file    Attends meetings of clubs or organizations: Not on file    Relationship status: Not on file  Other Topics Concern  . Not on file  Social History Narrative  . Not on  file   Allergies  Allergen Reactions  . Chocolate Anaphylaxis  . Food     chocolate  . Statins     Intolerant  lft elevation  . Latex Rash   Family History  Problem Relation Age of Onset  . Diabetes Father   . Hypertension Father   . Stroke Father   . Kidney disease Father   . Heart disease Father   . Cancer Mother 21       breast ca died at age 41  . Hypertension Unknown   . Diabetes type II Unknown   . Obesity Unknown   . Obesity Brother   . Hypertension Brother   . Heart disease Maternal Grandmother   . Alzheimer's disease Maternal Grandmother   . Diabetes Paternal Grandmother   . Heart disease Paternal Grandfather     Current Outpatient Medications (Endocrine & Metabolic):  .  SYNJARDY XR 12.10-998 MG TB24, 2 tablets daily.  Marland Kitchen  VICTOZA 18 MG/3ML SOPN, INJECT 1.8 MG INTO THE SKIN AS DIRECTED DAILY.    Current Outpatient Medications (Analgesics):  Marland Kitchen  Acetaminophen (TYLENOL PO), Take by mouth as  needed. Marland Kitchen  aspirin EC 81 MG tablet, Take 81 mg by mouth daily.  Current Outpatient Medications (Hematological):  .  folic acid (FOLVITE) 1 MG tablet, Take 1 tablet (1 mg total) by mouth 2 (two) times daily.  Current Outpatient Medications (Other):  Marland Kitchen  ACCU-CHEK GUIDE test strip,  .  Cholecalciferol (VITAMIN D) 2000 units CAPS, Take by mouth daily. .  clobetasol cream (TEMOVATE) 9.52 %, Apply 1 application topically 2 (two) times daily. As directed; never apply to face. .  Diclofenac Sodium (PENNSAID) 2 % SOLN, Place 2 application onto the skin 2 (two) times daily. .  methotrexate 50 MG/2ML injection, INJECT 0.6 MLS UNDER THE SKIN EVERY FRIDAY .  MILK THISTLE-DAND-FENNEL-LICOR PO, Take 1 capsule by mouth daily. Marland Kitchen  neomycin-polymyxin-hydrocortisone (CORTISPORIN) 3.5-10000-1 OTIC suspension, Place 3 drops into the right ear 3 (three) times daily. .  Omega-3 Fatty Acids (FISH OIL) 1200 MG CAPS, Take 1-2 capsules (1,200-2,400 mg total) by mouth 2 (two) times daily. Takes 2400 mg qam and 1200 mg qpm .  Secukinumab, 300 MG Dose, (COSENTYX SENSOREADY, 300 MG,) 150 MG/ML SOAJ, Inject 150 mg into the skin every 14 (fourteen) days. .  TURMERIC PO, Take by mouth 2 (two) times daily.  Marland Kitchen  UNIFINE PENTIPS 32G X 4 MM MISC,     Past medical history, social, surgical and family history all reviewed in electronic medical record.  No pertanent information unless stated regarding to the chief complaint.   Review of Systems:  No headache, visual changes, nausea, vomiting, diarrhea, constipation, dizziness, abdominal pain, skin rash, fevers, chills, night sweats, weight loss, swollen lymph nodes, body aches, joint swelling, muscle aches, chest pain, shortness of breath, mood changes.   Objective  There were no vitals taken for this visit. Systems examined below as of    General: No apparent distress alert and oriented x3 mood and affect normal, dressed appropriately.  HEENT: Pupils equal, extraocular  movements intact  Respiratory: Patient's speak in full sentences and does not appear short of breath  Cardiovascular: No lower extremity edema, non tender, no erythema  Skin: Warm dry intact with no signs of infection or rash on extremities or on axial skeleton.  Abdomen: Soft nontender  Neuro: Cranial nerves II through XII are intact, neurovascularly intact in all extremities with 2+ DTRs and 2+ pulses.  Lymph: No lymphadenopathy of  posterior or anterior cervical chain or axillae bilaterally.  Gait normal with good balance and coordination.  MSK: Trace tender with full range of motion and good stability and symmetric strength and tone of shoulders, elbows, wrist, hip, and ankles bilaterally.  Mild arthritic changes of multiple joints Knee: Bilateral valgus deformity noted.  Abnormal thigh to calf ratio.  Trace effusion of the left knee Tender to palpation over medial and PF joint line.  ROM full in flexion and extension and lower leg rotation. instability with valgus force.  painful patellar compression. Patellar glide with moderate crepitus. Patellar and quadriceps tendons unremarkable. Hamstring and quadriceps strength is normal.  Back Exam:  Inspection: Unremarkable  Motion: Flexion 45 deg, Extension 20 deg, Side Bending to 35 deg bilaterally,  Rotation to 45 deg bilaterally  SLR laying: Negative  XSLR laying: Negative  Palpable tenderness: Tender to palpation of paraspinal musculature lumbar spine right greater than left. FABER: Positive Faber bilaterally. Sensory change: Gross sensation intact to all lumbar and sacral dermatomes.  Reflexes: 2+ at both patellar tendons, 2+ at achilles tendons, Babinski's downgoing.  Strength at foot  Plantar-flexion: 5/5 Dorsi-flexion: 5/5 Eversion: 5/5 Inversion: 5/5  Leg strength  Quad: 5/5 Hamstring: 5/5 Hip flexor: 5/5 Hip abductors: 5/5    Osteopathic findings C2 flexed rotated and side bent right C4 flexed rotated and side bent  left C7 flexed rotated and side bent left T3 extended rotated and side bent right inhaled third rib T6 extended rotated and side bent left L2 flexed rotated and side bent right Sacrum right on right    Impression and Recommendations:     This case required medical decision making of moderate complexity. The above documentation has been reviewed and is accurate and complete Lyndal Pulley, DO       Note: This dictation was prepared with Dragon dictation along with smaller phrase technology. Any transcriptional errors that result from this process are unintentional.

## 2018-06-17 NOTE — Assessment & Plan Note (Signed)
Decision today to treat with OMT was based on Physical Exam  After verbal consent patient was treated with HVLA, ME, FPR techniques in cervical, thoracic, rib lumbar and sacral areas  Patient tolerated the procedure well with improvement in symptoms  Patient given exercises, stretches and lifestyle modifications  See medications in patient instructions if given  Patient will follow up in 4-6 weeks 

## 2018-07-01 NOTE — Progress Notes (Signed)
Office Visit Note  Patient: Tabitha [name]             Date of Birth: 05-03-1966           MRN: [id number]             PCP: Hoyt Koch, MD Referring: Hoyt Koch, * Visit Date: 07/15/2018 Occupation: @GUAROCC @  Subjective:  Pain in both knee joints   History of Present Illness: Tabitha [name] is a 53 y.o. female with history of psoriatic arthritis and osteoarthritis. She is on Cosentyx 150 mg every 2 weeks, MTX 0.6 ml sq once weekly, and folic acid 2 mg po daily.  She has not had any recent psoriatic arthritis flares.  She states that over the past 6 weeks she has been having increased pain in bilateral knee joints.  She states that she has been walking more frequently and has been working with a Physiological scientist once a week.  She states that she went to physical therapy which provided some benefit.  She states in the past she has had Monovisc injections but her insurance would not longer cover them.  She states that she has been using Pennsaid topically which provides temporary relief.  She does not want to have cortisone injections due to having several in the past.  She reports that both knees have been swelling intermittently.  She states that she has very mild pain in bilateral hands and notices intermittent joint swelling in her hands.  She states that she is scattered patches of psoriasis.  She states that she has been under tremendous amount of stress at work which has been causing increased discomfort and psoriasis to flare.  After her last Cosentyx injection the psoriasis began to clear.  She denies any Achilles denies or plantar fasciitis at this time.  She has mild SI joint pain as well as trochanteric bursitis bilaterally.  She has been having increased insomnia and fatigue related to work stress. She has not had any recent infections.  She received the annual influenza vaccination.    Activities of Daily Living:  Patient reports morning stiffness for 1 hour.     Patient Reports nocturnal pain.  Difficulty dressing/grooming: Denies Difficulty climbing stairs: Reports Difficulty getting out of chair: Reports Difficulty using hands for taps, buttons, cutlery, and/or writing: Denies  Review of Systems  Constitutional: Positive for fatigue.  HENT: Negative for mouth sores, mouth dryness and nose dryness.   Eyes: Positive for redness and dryness. Negative for pain and visual disturbance.  Respiratory: Negative for cough, hemoptysis, shortness of breath, wheezing and difficulty breathing.   Cardiovascular: Positive for swelling in legs/feet. Negative for chest pain, palpitations and hypertension.  Gastrointestinal: Negative for abdominal pain, blood in stool, constipation and diarrhea.  Endocrine: Negative for increased urination.  Genitourinary: Negative for painful urination, nocturia and pelvic pain.  Musculoskeletal: Positive for arthralgias, joint pain, joint swelling and morning stiffness. Negative for myalgias, muscle weakness, muscle tenderness and myalgias.  Skin: Negative for color change, pallor, rash, hair loss, nodules/bumps, redness, skin tightness, ulcers and sensitivity to sunlight.  Allergic/Immunologic: Negative for susceptible to infections.  Neurological: Negative for dizziness, light-headedness, numbness, headaches and memory loss.  Hematological: Negative for bruising/bleeding tendency and swollen glands.  Psychiatric/Behavioral: Positive for sleep disturbance. Negative for depressed mood and confusion. The patient is not nervous/anxious.     PMFS History:  Patient Active Problem List   Diagnosis Date Noted  . Ear symptom 04/22/2018  . Nonallopathic lesion  of lumbosacral region 03/14/2018  . Back pain 12/21/2017  . Nonallopathic lesion of cervical region 12/21/2017  . Biceps tendinitis of left shoulder 11/14/2017  . Primary osteoarthritis of both hands 10/30/2017  . Primary osteoarthritis of both feet 10/30/2017  . Rash  09/17/2017  . Routine general medical examination at a health care facility 04/05/2017  . Degenerative arthritis of knee, bilateral 12/20/2016  . Nonallopathic lesion of sacral region 10/04/2013  . Nonallopathic lesion of thoracic region 10/04/2013  . Abnormal mammogram 07/04/2012  . Obesity 05/28/2012  . Psoriatic arthritis ([place]) 03/12/2012  . Diabetes mellitus due to underlying condition with hyperglycemia ([place]) 10/03/2011  . Psoriasis 03/30/2011  . Hyperlipidemia associated with type 2 diabetes mellitus ([place]) 05/25/2009  . Nonallopathic lesion of rib cage 05/25/2009  . POLYCYSTIC OVARIES 04/07/2009    Past Medical History:  Diagnosis Date  . Arthritis   . De Quervain's disease (tenosynovitis)   . Dermatophytosis, scalp   . Diabetes mellitus   . Hypercholesteremia   . Obesity   . Polycystic ovarian disease   . Psoriatic arthritis ([place])   . Wears glasses     Family History  Problem Relation Age of Onset  . Diabetes Father   . Hypertension Father   . Stroke Father   . Kidney disease Father   . Heart disease Father   . Cancer Mother 31       breast ca died at age 14  . Hypertension Other   . Diabetes type II Other   . Obesity Other   . Obesity Brother   . Hypertension Brother   . Heart disease Maternal Grandmother   . Alzheimer's disease Maternal Grandmother   . Diabetes Paternal Grandmother   . Heart disease Paternal Grandfather    Past Surgical History:  Procedure Laterality Date  . CARPAL TUNNEL RELEASE    . COLONOSCOPY    . DE QUERVAIN'S RELEASE  2012   left  . KNEE ARTHROPLASTY    . KNEE ARTHROSCOPY Right 05/06/2013   Procedure: RIGHT KNEE ARTHROSCOPY ;  Surgeon: Hessie Dibble, MD;  Location: [facility];  Service: Orthopedics;  Laterality: Right;  partial lateral minisectomy and chondroplasty  . WISDOM TOOTH EXTRACTION    . WRIST SURGERY  2001   carpel tunnel -rt   Social History   Social History Narrative  . Not on file    Immunization History  Administered Date(s) Administered  . Influenza,inj,Quad PF,6+ Mos 03/26/2014  . Influenza-Unspecified 04/11/2015, 03/26/2017, 03/26/2018  . Tdap 06/12/2010     Objective: Vital Signs: BP 132/81 (BP Location: Left Arm, Patient Position: Sitting, Cuff Size: Normal)   Pulse 88   Resp 14   Ht 5\' 3"  (1.6 m)   Wt 235 lb (106.6 kg)   BMI 41.63 kg/m    Physical Exam Vitals signs and nursing note reviewed.  Constitutional:      Appearance: She is well-developed.  HENT:     Head: Normocephalic and atraumatic.  Eyes:     Conjunctiva/sclera: Conjunctivae normal.  Neck:     Musculoskeletal: Normal range of motion.  Cardiovascular:     Rate and Rhythm: Normal rate and regular rhythm.     Heart sounds: Normal heart sounds.  Pulmonary:     Effort: Pulmonary effort is normal.     Breath sounds: Normal breath sounds.  Abdominal:     General: Bowel sounds are normal.     Palpations: Abdomen is soft.  Lymphadenopathy:     Cervical: No  cervical adenopathy.  Skin:    General: Skin is warm and dry.     Capillary Refill: Capillary refill takes less than 2 seconds.  Neurological:     Mental Status: She is alert and oriented to person, place, and time.  Psychiatric:        Behavior: Behavior normal.      Musculoskeletal Exam: C-spine limited range of motion with discomfort.  Thoracic and lumbar spine good ROM.  No midline spinal tenderness.  Mild SI joint tenderness bilaterally.  Shoulder joints, elbow joints, wrist joints, MCPs and PIPs, DIPs good range of motion with no synovitis.  She has PIP and DIP synovial thickening bilaterally.  Hip joints good range of motion with no discomfort.  She is tenderness over bilateral trochanteric bursa.  Knee joints full range of motion with some discomfort.  She has warmth of bilateral knee joints.  Bilateral knee crepitus.  No tenderness or swelling of ankle joints.  No Achilles tenderness or plantar fasciitis.  CDAI Exam: CDAI  Score: Not documented Patient Global Assessment: Not documented; Provider Global Assessment: Not documented Swollen: Not documented; Tender: Not documented Joint Exam   Not documented   There is currently no information documented on the homunculus. Go to the Rheumatology activity and complete the homunculus joint exam.  Investigation: No additional findings.  Imaging: No results found.  Recent Labs: Lab Results  Component Value Date   WBC 10.4 05/03/2018   HGB 13.8 05/03/2018   PLT 237 05/03/2018   NA 139 05/03/2018   K 4.0 05/03/2018   CL 103 05/03/2018   CO2 26 05/03/2018   GLUCOSE 125 (H) 05/03/2018   BUN 24 05/03/2018   CREATININE 0.81 05/03/2018   BILITOT 0.3 05/03/2018   ALKPHOS 71 04/05/2017   AST 16 05/03/2018   ALT 24 05/03/2018   PROT 6.6 05/03/2018   ALBUMIN 3.7 04/05/2017   CALCIUM 9.6 05/03/2018   GFRAA 96 05/03/2018   QFTBGOLD Negative 01/09/2017   QFTBGOLDPLUS NEGATIVE 01/11/2018    Speciality Comments: Failed Humira/Remicade  Procedures:  No procedures performed Allergies: Chocolate; Food; Statins; and Latex   Assessment / Plan:     Visit Diagnoses: Psoriatic arthritis ([place]): She has no synovitis or dactylitis on exam.  She has PIP and DIP synovial thickening consistent with osteoarthritis of bilateral hands.  She is clinically doing well on Cosentyx 150 mg subcutaneous injections every 2 weeks, methotrexate 0.6 mL subcutaneous once weekly and folic acid 2 mg by mouth daily.  She has been having increased pain in bilateral knee joints for the past 6 months.  She has warmth but no effusion on exam today.  She has bilateral knee crepitus.  She declined cortisone injections today in the office.  She has been using Pennsaid topically which provides temporary relief.  She has had Monovisc injections in the past performed by Dr. Alroy Dust but her insurance would no longer cover these injections. She will continue on Cosentyx and methotrexate combination therapy  at this time.  She was advised to notify us if she develops increased joint pain or joint swelling.  She will follow-up in the office in 5 months.  Psoriasis: Over the past 6 weeks she has had increased scattered patches of psoriasis.  Her most recent Cosentyx injection cleared the psoriasis.  A refill of clobetasol will be sent to the pharmacy.  High risk medications (not anticoagulants) long-term use - cosentyx 150 mg sq every 2 weeks, MTX 0.6 ml sq q wk, folic acid 2 mg  poqd(failed Humira and Remicade).  CBC and CMP will be drawn today to monitor for drug toxicity.  She will return in May and every 3 months.  TB gold negative 01/11/2018.- Plan: COMPLETE METABOLIC PANEL WITH GFR, CBC with Differential/Platelet  Erythema nodosum: No recent flares.   Primary osteoarthritis of both hands: She has PIP and DIP synovial thickening consistent with osteoarthritis of bilateral hands.  She has complete fist formation bilaterally.  She has no synovitis on exam.  Joint protection and muscle strengthening were discussed.  Primary osteoarthritis of both knees: She has been having increased pain in bilateral knee joints for the past 6 months.  She has warmth but no effusion on exam today.  She has bilateral knee crepitus.  She declined cortisone injections today in the office.  She has been using Pennsaid topically which provides temporary relief.  She has had Monovisc injections in the past performed by Dr. Alroy Dust but her insurance would no longer cover these injections.  She is considering PRP injections.  We discussed the importance of muscle strengthening as well as weight loss.  She is been going to work with a Physiological scientist once weekly and doing exercises on her own several days a week.  She has been to physical therapy in the past which provided some relief.  She was encouraged to continue to perform quad strengthening and knee exercises.  She is considering appealing the insurance denial of the Monovisc  injections.   Patellofemoral arthritis of left knee: She has warmth and crepitus on exam.    Primary osteoarthritis of both feet: She has intermittent discomfort in bilateral feet.  She has no Achilles tendinitis or plantar fasciitis at this time.  No tenderness or swelling of ankle joints.  She was proper fitting shoes.  Other medical conditions are listed as follows:  History of PCOS  History of hyperlipidemia  History of diabetes mellitus   Orders: Orders Placed This Encounter  Procedures  . COMPLETE METABOLIC PANEL WITH GFR  . CBC with Differential/Platelet   Meds ordered this encounter  Medications  . clobetasol cream (TEMOVATE) 0.05 %    Sig: Apply 1 application topically 2 (two) times daily. As directed; never apply to face.    Dispense:  30 g    Refill:  2    Face-to-face time spent with patient was 30 minutes. Greater than 50% of time was spent in counseling and coordination of care.  Follow-Up Instructions: Return in about 5 months (around 12/13/2018) for Psoriatic arthritis, Osteoarthritis.   Ofilia Neas, PA-C  Note - This record has been created using Dragon software.  Chart creation errors have been sought, but may not always  have been located. Such creation errors do not reflect on  the standard of medical care.

## 2018-07-02 ENCOUNTER — Encounter: Payer: Self-pay | Admitting: Pharmacy Technician

## 2018-07-02 MED FILL — COSENTYX 300 MG DOSE-2 PENS: 150 | 28 days supply | Qty: 2 | Fill #1

## 2018-07-02 MED FILL — VICTOZA 18 MG/3 ML INJECT P: 18 | 30 days supply | Qty: 9 | Fill #1

## 2018-07-03 ENCOUNTER — Telehealth: Payer: Self-pay | Admitting: Pharmacy Technician

## 2018-07-03 NOTE — Telephone Encounter (Signed)
Left patient a voicemail to check status of her copay card status. Patient is calling to request additional assistance. Her copay is $200 after her current  Cosentyx copay card is billed. Company advised patient needed to call in to make this request.  Since the company is having extremely high call volumes, advised patient she could stop by office for a sample if needed.

## 2018-07-15 ENCOUNTER — Encounter: Payer: Self-pay | Admitting: Physician Assistant

## 2018-07-15 ENCOUNTER — Ambulatory Visit: Payer: No Typology Code available for payment source | Admitting: Physician Assistant

## 2018-07-15 VITALS — BP 132/81 | HR 88 | Resp 14 | Ht 63.0 in | Wt 235.0 lb

## 2018-07-15 DIAGNOSIS — Z79899 Other long term (current) drug therapy: Secondary | ICD-10-CM

## 2018-07-15 DIAGNOSIS — L52 Erythema nodosum: Secondary | ICD-10-CM | POA: Diagnosis not present

## 2018-07-15 DIAGNOSIS — Z8742 Personal history of other diseases of the female genital tract: Secondary | ICD-10-CM

## 2018-07-15 DIAGNOSIS — M170 Bilateral primary osteoarthritis of knee: Secondary | ICD-10-CM

## 2018-07-15 DIAGNOSIS — L409 Psoriasis, unspecified: Secondary | ICD-10-CM | POA: Diagnosis not present

## 2018-07-15 DIAGNOSIS — M19042 Primary osteoarthritis, left hand: Secondary | ICD-10-CM

## 2018-07-15 DIAGNOSIS — M1712 Unilateral primary osteoarthritis, left knee: Secondary | ICD-10-CM

## 2018-07-15 DIAGNOSIS — L4050 Arthropathic psoriasis, unspecified: Secondary | ICD-10-CM | POA: Diagnosis not present

## 2018-07-15 DIAGNOSIS — M19041 Primary osteoarthritis, right hand: Secondary | ICD-10-CM

## 2018-07-15 DIAGNOSIS — M19071 Primary osteoarthritis, right ankle and foot: Secondary | ICD-10-CM

## 2018-07-15 DIAGNOSIS — M19072 Primary osteoarthritis, left ankle and foot: Secondary | ICD-10-CM

## 2018-07-15 DIAGNOSIS — Z8639 Personal history of other endocrine, nutritional and metabolic disease: Secondary | ICD-10-CM

## 2018-07-15 LAB — CBC WITH DIFFERENTIAL/PLATELET
Absolute Monocytes: 774 cells/uL (ref 200–950)
Basophils Absolute: 62 cells/uL (ref 0–200)
Basophils Relative: 0.7 %
Eosinophils Absolute: 123 cells/uL (ref 15–500)
Eosinophils Relative: 1.4 %
HCT: 42.3 % (ref 35.0–45.0)
Hemoglobin: 14.1 g/dL (ref 11.7–15.5)
Lymphs Abs: 1707 cells/uL (ref 850–3900)
MCH: 30.2 pg (ref 27.0–33.0)
MCHC: 33.3 g/dL (ref 32.0–36.0)
MCV: 90.6 fL (ref 80.0–100.0)
MPV: 11.5 fL (ref 7.5–12.5)
Monocytes Relative: 8.8 %
Neutro Abs: 6134 cells/uL (ref 1500–7800)
Neutrophils Relative %: 69.7 %
Platelets: 219 10*3/uL (ref 140–400)
RBC: 4.67 10*6/uL (ref 3.80–5.10)
RDW: 12.6 % (ref 11.0–15.0)
Total Lymphocyte: 19.4 %
WBC: 8.8 10*3/uL (ref 3.8–10.8)

## 2018-07-15 LAB — COMPLETE METABOLIC PANEL WITH GFR
AG Ratio: 1.5 (calc) (ref 1.0–2.5)
ALT: 25 U/L (ref 6–29)
AST: 19 U/L (ref 10–35)
Albumin: 4.3 g/dL (ref 3.6–5.1)
Alkaline phosphatase (APISO): 73 U/L (ref 37–153)
BUN: 20 mg/dL (ref 7–25)
CO2: 27 mmol/L (ref 20–32)
Calcium: 9.6 mg/dL (ref 8.6–10.4)
Chloride: 106 mmol/L (ref 98–110)
Creat: 0.71 mg/dL (ref 0.50–1.05)
GFR, Est African American: 113 mL/min/{1.73_m2} (ref >=60)
GFR, Est Non African American: 97 mL/min/{1.73_m2} (ref >=60)
Globulin: 2.8 g/dL (calc) (ref 1.9–3.7)
Glucose, Bld: 85 mg/dL (ref 65–99)
Potassium: 4.1 mmol/L (ref 3.5–5.3)
Sodium: 141 mmol/L (ref 135–146)
Total Bilirubin: 0.4 mg/dL (ref 0.2–1.2)
Total Protein: 7.1 g/dL (ref 6.1–8.1)

## 2018-07-15 MED ORDER — CLOBETASOL PROPIONATE 0.05 % EX CREA: 1.0000 "application " | TOPICAL_CREAM | Freq: Two times a day (BID) | CUTANEOUS | 2 refills | Status: DC

## 2018-07-15 MED FILL — CLOBETASOL PROPIONATE 0.05: 0.05 | 15 days supply | Qty: 30 | Fill #0

## 2018-07-15 NOTE — Patient Instructions (Signed)
Standing Labs We placed an order today for your standing lab work.    Please come back and get your standing labs in may and every 3 months   We have open lab Monday through Friday from 8:30-11:30 AM and 1:30-4:00 PM  at the office of Dr. Bo Merino.   You may experience shorter wait times on Monday and Friday afternoons. The office is located at [address], [place], [place], [place] [address] No appointment is necessary.   Labs are drawn by [facility].  You may receive a bill from [facility] for your lab work.  If you wish to have your labs drawn at another location, please call the office 24 hours in advance to send orders.  If you have any questions regarding directions or hours of operation,  please call [phone].   Just as a reminder please drink plenty of water prior to coming for your lab work. Thanks!

## 2018-07-17 MED FILL — SYNJARDY XR 12.5-1,000 MG T: 12.5-1000 | 90 days supply | Qty: 180 | Fill #0

## 2018-07-20 NOTE — Progress Notes (Signed)
[name] [place], [place] [address] Phone: [phone] Subjective:   [name], am serving as a scribe for Dr. Hulan Saas.     CC: Back pain and bilateral knee pain   QJJ:[id number]  Tabitha [name] is a 53 y.o. female coming in with complaint of back pain and bilateral knee pain. Notes that she has some numbness in anterior left leg to below that patella. No changes otherwise with her back pain.  Known arthritic changes as well as history of autoimmune.  She said that she has pain all throughout the joint. No pattern to her pain. Does have an increase in swelling when she is on her feet more. Also notes some weakness in the knee. Uses ice, Pennsaid and Tylenol.       Past Medical History:  Diagnosis Date  . Arthritis   . De Quervain's disease (tenosynovitis)   . Dermatophytosis, scalp   . Diabetes mellitus   . Hypercholesteremia   . Obesity   . Polycystic ovarian disease   . Psoriatic arthritis ([place])   . Wears glasses    Past Surgical History:  Procedure Laterality Date  . CARPAL TUNNEL RELEASE    . COLONOSCOPY    . DE QUERVAIN'S RELEASE  2012   left  . KNEE ARTHROPLASTY    . KNEE ARTHROSCOPY Right 05/06/2013   Procedure: RIGHT KNEE ARTHROSCOPY ;  Surgeon: Hessie Dibble, MD;  Location: [facility];  Service: Orthopedics;  Laterality: Right;  partial lateral minisectomy and chondroplasty  . WISDOM TOOTH EXTRACTION    . WRIST SURGERY  2001   carpel tunnel -rt   Social History   Socioeconomic History  . Marital status: Legally Separated    Spouse name: Not on file  . Number of children: Not on file  . Years of education: Not on file  . Highest education level: Not on file  Occupational History  . 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 Financial resource strain: Not on file  . Food insecurity:    Worry: Not on file    Inability: Not on file    . Transportation needs:    Medical: Not on file    Non-medical: Not on file  Tobacco Use  . Smoking status: Former Smoker    Packs/day: 1.30    Years: 12.00    Pack years: 15.60    Types: Cigarettes    Start date: 06/12/1984    Last attempt to quit: 03/18/1995    Years since quitting: 23.3  . Smokeless tobacco: Never Used  . Tobacco comment: Not interested in returning to smoking.   Substance and Sexual Activity  . Alcohol use: Yes    Comment: rarely  . Drug use: No  . Sexual activity: Yes    Partners: Male    Comment: married  Lifestyle  . Physical activity:    Days per week: Not on file    Minutes per session: Not on file  . Stress: Not on file  Relationships  . Social connections:    Talks on phone: Not on file    Gets together: Not on file    Attends religious service: Not on file    Active member of club or organization: Not on file    Attends meetings of clubs or organizations: Not on file    Relationship status: Not on file  Other Topics Concern  . Not on file  Social History Narrative  . Not on file   Allergies  Allergen Reactions  . Chocolate Anaphylaxis  . Food     chocolate  . Statins     Intolerant  lft elevation  . Latex Rash   Family History  Problem Relation Age of Onset  . Diabetes Father   . Hypertension Father   . Stroke Father   . Kidney disease Father   . Heart disease Father   . Cancer Mother 61       breast ca died at age 81  . Hypertension Other   . Diabetes type II Other   . Obesity Other   . Obesity Brother   . Hypertension Brother   . Heart disease Maternal Grandmother   . Alzheimer's disease Maternal Grandmother   . Diabetes Paternal Grandmother   . Heart disease Paternal Grandfather     Current Outpatient Medications (Endocrine & Metabolic):  .  SYNJARDY XR 12.10-998 MG TB24, 2 tablets daily.  Marland Kitchen  VICTOZA 18 MG/3ML SOPN, INJECT 1.8 MG INTO THE SKIN AS DIRECTED DAILY.    Current Outpatient Medications (Analgesics):  Marland Kitchen   Acetaminophen (TYLENOL PO), Take by mouth as needed. Marland Kitchen  aspirin EC 81 MG tablet, Take 81 mg by mouth daily.  Current Outpatient Medications (Hematological):  .  folic acid (FOLVITE) 1 MG tablet, Take 1 tablet (1 mg total) by mouth 2 (two) times daily.  Current Outpatient Medications (Other):  Marland Kitchen  ACCU-CHEK GUIDE test strip,  .  cholecalciferol (VITAMIN D3) 25 MCG (1000 UT) tablet, Take 1,000 Units by mouth daily. .  clobetasol cream (TEMOVATE) 4.40 %, Apply 1 application topically 2 (two) times daily. As directed; never apply to face. .  Diclofenac Sodium (PENNSAID) 2 % SOLN, Place 2 application onto the skin 2 (two) times daily. .  methotrexate 50 MG/2ML injection, INJECT 0.6 MLS UNDER THE SKIN EVERY FRIDAY .  MILK THISTLE-DAND-FENNEL-LICOR PO, Take 1 capsule by mouth daily. .  Omega-3 Fatty Acids (FISH OIL) 1200 MG CAPS, Take 1-2 capsules (1,200-2,400 mg total) by mouth 2 (two) times daily. Takes 2400 mg qam and 1200 mg qpm .  Secukinumab, 300 MG Dose, (COSENTYX SENSOREADY, 300 MG,) 150 MG/ML SOAJ, Inject 150 mg into the skin every 14 (fourteen) days. .  TURMERIC PO, Take by mouth 2 (two) times daily.  Marland Kitchen  UNIFINE PENTIPS 32G X 4 MM MISC,  .  Hyaluronan (MONOVISC) 88 MG/4ML SOSY, Inject once into R knee. Inject once into Left knee    Past medical history, social, surgical and family history all reviewed in electronic medical record.  No pertanent information unless stated regarding to the chief complaint.   Review of Systems:  No headache, visual changes, nausea, vomiting, diarrhea, constipation, dizziness, abdominal pain, skin rash, fevers, chills, night sweats, weight loss, swollen lymph nodes, body aches, joint swelling,  chest pain, shortness of breath, mood changes.  Positive muscle aches  Objective  Blood pressure 110/72, pulse 82, height 5\' 3"  (1.6 m), weight 232 lb (105.2 kg), SpO2 97 %.   General: No apparent distress alert and oriented x3 mood and affect normal, dressed  appropriately.  HEENT: Pupils equal, extraocular movements intact  Respiratory: Patient's speak in full sentences and does not appear short of breath  Cardiovascular: No lower extremity edema, non tender, no erythema  Skin: Warm dry intact with no signs of infection or rash on extremities or on axial skeleton.  Abdomen: Soft nontender  Neuro: Cranial nerves II through XII are intact, neurovascularly intact in all extremities with 2+ DTRs and 2+ pulses.  Lymph: No lymphadenopathy of posterior or anterior cervical chain or axillae bilaterally.  Gait normal with good balance and coordination.  MSK:  Non tender with full range of motion and good stability and symmetric strength and tone of shoulders, elbows, wrist, hip and ankles bilaterally.  Knee: Bilateral valgus deformity noted. Large thigh to calf ratio.  Small effusions noted Tender to palpation over medial and PF joint line.  More over the patellofemoral ROM full in flexion and extension and lower leg rotation. instability with valgus force.  painful patellar compression. Patellar glide with moderate crepitus. Patellar and quadriceps tendons unremarkable. Hamstring and quadriceps strength is normal.   Back Exam:  Inspection: Loss of lordosis Motion: Flexion 35 deg, Extension 25 deg, Side Bending to 35 deg bilaterally,  Rotation to 25 deg bilaterally  SLR laying: Negative  XSLR laying: Negative  Palpable tenderness: To palpation laterally in the paraspinal musculature lumbar spine and the sacroiliac. FABER: Tightness bilaterally. Sensory change: Gross sensation intact to all lumbar and sacral dermatomes.  Reflexes: 2+ at both patellar tendons, 2+ at achilles tendons, Babinski's downgoing.  Strength at foot  Plantar-flexion: 5/5 Dorsi-flexion: 5/5 Eversion: 5/5 Inversion: 5/5  Leg strength  Quad: 5/5 Hamstring: 5/5 Hip flexor: 5/5 Hip abductors: 4/5 but symmetric  Osteopathic findings  C6 flexed rotated and side bent left T3  extended rotated and side bent right inhaled third rib T7 extended rotated and side bent left L2 flexed rotated and side bent right Sacrum right on right    Impression and Recommendations:     This case required medical decision making of moderate complexity. The above documentation has been reviewed and is accurate and complete Lyndal Pulley, DO       Note: This dictation was prepared with Dragon dictation along with smaller phrase technology. Any transcriptional errors that result from this process are unintentional.

## 2018-07-22 ENCOUNTER — Telehealth: Payer: Self-pay | Admitting: Internal Medicine

## 2018-07-22 ENCOUNTER — Ambulatory Visit: Payer: No Typology Code available for payment source | Admitting: Family Medicine

## 2018-07-22 ENCOUNTER — Encounter: Payer: Self-pay | Admitting: Family Medicine

## 2018-07-22 VITALS — BP 110/72 | HR 82 | Ht 63.0 in | Wt 232.0 lb

## 2018-07-22 DIAGNOSIS — M170 Bilateral primary osteoarthritis of knee: Secondary | ICD-10-CM | POA: Diagnosis not present

## 2018-07-22 DIAGNOSIS — L4050 Arthropathic psoriasis, unspecified: Secondary | ICD-10-CM

## 2018-07-22 DIAGNOSIS — M999 Biomechanical lesion, unspecified: Secondary | ICD-10-CM

## 2018-07-22 MED ORDER — HYALURONAN 88 MG/4ML IX SOSY: PREFILLED_SYRINGE | INTRA_ARTICULAR | 0 refills | Status: DC

## 2018-07-22 NOTE — Telephone Encounter (Signed)
Copied from [place] [id number]. Topic: Quick Communication - Rx Refill/Question >> Jul 22, 2018 11:06 AM Virl Axe D wrote: Medication: Hyaluronan (MONOVISC) 47 MG/4ML SOSY / Pharmacy stated they sent over a request for a prior auth for Rx. Please advise.  Has the patient contacted their pharmacy? Yes.   (Agent: If no, request that the patient contact the pharmacy for the refill.) (Agent: If yes, when and what did the pharmacy advise?)  Preferred Pharmacy (with phone number or street name): [place], [place] - [place] [phone] (Phone) [phone] (Fax)  Agent: Please be advised that RX refills may take up to 3 business days. We ask that you follow-up with your pharmacy.

## 2018-07-22 NOTE — Assessment & Plan Note (Signed)
Decision today to treat with OMT was based on Physical Exam  After verbal consent patient was treated with HVLA, ME, FPR techniques in cervical, thoracic, rib, lumbar and sacral areas  Patient tolerated the procedure well with improvement in symptoms  Patient given exercises, stretches and lifestyle modifications  See medications in patient instructions if given  Patient will follow up in 4-6 weeks 

## 2018-07-22 NOTE — Assessment & Plan Note (Signed)
Discussed icing regimen and home exercises.  Which activities to do which wants to avoid.  Discussed icing regimen.  Patient wanted to hold on the injections at the moment.  We discussed the possibility of Visco supplementation and will try to send into the pharmacy to see how that does.  Follow-up with me again in 4 to 6 weeks

## 2018-07-22 NOTE — Patient Instructions (Signed)
Good to se eyou  Calcium pyruvate 1500mg  daily  We will try the monovisc Stay active See me again in 4ish weeks just in case

## 2018-07-24 ENCOUNTER — Ambulatory Visit: Payer: No Typology Code available for payment source | Admitting: Family Medicine

## 2018-07-29 NOTE — Telephone Encounter (Signed)
Per pt's insurance- Monovisc through the pharmacy is denied.

## 2018-07-31 MED FILL — UNIFINE PENTIPS 31GX3/16: 31G X 5 MM | 90 days supply | Qty: 100 | Fill #3

## 2018-07-31 MED FILL — UNIFINE PENTIPS 31GX3/16": 31G X 5 MM | 90 days supply | Qty: 100 | Fill #3

## 2018-07-31 MED FILL — VICTOZA 18 MG/3 ML INJECT P: 18 | 30 days supply | Qty: 9 | Fill #0

## 2018-07-31 MED FILL — COSENTYX 300 MG DOSE-2 PENS: 150 | 28 days supply | Qty: 2 | Fill #2

## 2018-08-01 ENCOUNTER — Ambulatory Visit (INDEPENDENT_AMBULATORY_CARE_PROVIDER_SITE_OTHER): Payer: No Typology Code available for payment source | Admitting: Internal Medicine

## 2018-08-01 ENCOUNTER — Encounter: Payer: Self-pay | Admitting: Internal Medicine

## 2018-08-01 ENCOUNTER — Ambulatory Visit (INDEPENDENT_AMBULATORY_CARE_PROVIDER_SITE_OTHER)
Admission: RE | Admit: 2018-08-01 | Discharge: 2018-08-01 | Disposition: A | Discharge status: Home / Self Care | Payer: No Typology Code available for payment source | Source: Ambulatory Visit | Attending: Internal Medicine | Admitting: Internal Medicine

## 2018-08-01 VITALS — BP 110/62 | HR 84 | Temp 97.4°F | Ht 63.0 in | Wt 235.0 lb

## 2018-08-01 DIAGNOSIS — M79642 Pain in left hand: Secondary | ICD-10-CM

## 2018-08-01 NOTE — Patient Instructions (Signed)
We will get an x-ray of the hand today and will recommend not to lift for 1 week.

## 2018-08-01 NOTE — Assessment & Plan Note (Signed)
Checking x-ray and advised RICE. Can use ibuprofen for pain. Work note to avoid lifting for 1 week.

## 2018-08-01 NOTE — Progress Notes (Signed)
   Subjective:   Patient ID: Tabitha [name], female    DOB: October 06, 1965, 53 y.o.   MRN: [id number]  HPI The patient is a 53 YO female coming in for left hand pain. Was playing with dog yesterday and pulled suddenly which hyperextended her 3rd and 4th fingers. She had pain immediately afterwards. She did ice and take ibuprofen. This helped and woke up this morning with swelling in the hand and pain. She denies numbness or cold fingers. Denies pain with bending wrist but pain with movement of the 3 and 4 fingers left. Overall worsening. Has not taken ibuprofen this morning yet.   Review of Systems  Constitutional: Positive for activity change. Negative for appetite change, chills, fatigue, fever and unexpected weight change.  Respiratory: Negative.   Cardiovascular: Negative.   Gastrointestinal: Negative.   Musculoskeletal: Positive for arthralgias, joint swelling and myalgias. Negative for back pain and gait problem.  Skin: Negative.   Neurological: Negative.     Objective:  Physical Exam Constitutional:      Appearance: She is well-developed.  HENT:     Head: Normocephalic and atraumatic.  Neck:     Musculoskeletal: Normal range of motion.  Cardiovascular:     Rate and Rhythm: Normal rate and regular rhythm.  Pulmonary:     Effort: Pulmonary effort is normal. No respiratory distress.     Breath sounds: Normal breath sounds. No wheezing or rales.  Abdominal:     General: There is no distension.     Palpations: Abdomen is soft.     Tenderness: There is no abdominal tenderness. There is no rebound.  Musculoskeletal:        General: Swelling and tenderness present.     Comments: Some swelling along the hand left with pain on manipulation of the 3 and 4 fingers. No pain with flexion or extension of the wrist. No pain with palpation over the wrist. No redness, some mild bruising.   Skin:    General: Skin is warm and dry.  Neurological:     Mental Status: She is alert and oriented to  person, place, and time.     Coordination: Coordination normal.     Vitals:   08/01/18 0949  BP: 110/62  Pulse: 84  Temp: (!) 97.4 F (36.3 C)  TempSrc: Oral  SpO2: 98%  Weight: 235 lb (106.6 kg)  Height: 5\' 3"  (1.6 m)    Assessment & Plan:

## 2018-08-19 NOTE — Progress Notes (Signed)
[name] [address] [place], [place] [address] Phone: [phone] Subjective:   I Tabitha [name] am serving as a Education administrator for Dr. Hulan Saas.   CC: Knee pain and back pain follow-up  FMB:[id number]  Tabitha [name] is a 53 y.o. female coming in with complaint of back pain. Knees have been much better. Left foot pain on the lateral side. Suspected bunion.  Patient states that the knees seem to be doing better.  Not as much swelling at the moment.  Happy with the results of previous interventions at the moment.  Back pain is moderate.  More tightness.  Feels like she has been sick recently.  Has been trying to be more active.  Starting dating again.      Past Medical History:  Diagnosis Date  . Arthritis   . De Quervain's disease (tenosynovitis)   . Dermatophytosis, scalp   . Diabetes mellitus   . Hypercholesteremia   . Obesity   . Polycystic ovarian disease   . Psoriatic arthritis ([place])   . Wears glasses    Past Surgical History:  Procedure Laterality Date  . CARPAL TUNNEL RELEASE    . COLONOSCOPY    . DE QUERVAIN'S RELEASE  2012   left  . KNEE ARTHROPLASTY    . KNEE ARTHROSCOPY Right 05/06/2013   Procedure: RIGHT KNEE ARTHROSCOPY ;  Surgeon: Hessie Dibble, MD;  Location: [facility];  Service: Orthopedics;  Laterality: Right;  partial lateral minisectomy and chondroplasty  . WISDOM TOOTH EXTRACTION    . WRIST SURGERY  2001   carpel tunnel -rt   Social History   Socioeconomic History  . Marital status: Legally Separated    Spouse name: Not on file  . Number of children: Not on file  . Years of education: Not on file  . Highest education level: Not on file  Occupational History  . 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 Financial resource strain: Not on file  . Food insecurity:    Worry: Not on file    Inability: Not on file  . Transportation needs:    Medical:  Not on file    Non-medical: Not on file  Tobacco Use  . Smoking status: Former Smoker    Packs/day: 1.30    Years: 12.00    Pack years: 15.60    Types: Cigarettes    Start date: 06/12/1984    Last attempt to quit: 03/18/1995    Years since quitting: 23.4  . Smokeless tobacco: Never Used  . Tobacco comment: Not interested in returning to smoking.   Substance and Sexual Activity  . Alcohol use: Yes    Comment: rarely  . Drug use: No  . Sexual activity: Yes    Partners: Male    Comment: married  Lifestyle  . Physical activity:    Days per week: Not on file    Minutes per session: Not on file  . Stress: Not on file  Relationships  . Social connections:    Talks on phone: Not on file    Gets together: Not on file    Attends religious service: Not on file    Active member of club or organization: Not on file    Attends meetings of clubs or organizations: Not on file    Relationship status: Not on file  Other Topics Concern  . Not on file  Social History Narrative  . Not on file   Allergies  Allergen Reactions  . Chocolate Anaphylaxis  . Food     chocolate  . Statins     Intolerant  lft elevation  . Latex Rash   Family History  Problem Relation Age of Onset  . Diabetes Father   . Hypertension Father   . Stroke Father   . Kidney disease Father   . Heart disease Father   . Cancer Mother 37       breast ca died at age 60  . Hypertension Other   . Diabetes type II Other   . Obesity Other   . Obesity Brother   . Hypertension Brother   . Heart disease Maternal Grandmother   . Alzheimer's disease Maternal Grandmother   . Diabetes Paternal Grandmother   . Heart disease Paternal Grandfather     Current Outpatient Medications (Endocrine & Metabolic):  .  SYNJARDY XR 12.10-998 MG TB24, 2 tablets daily.  Marland Kitchen  VICTOZA 18 MG/3ML SOPN, INJECT 1.8 MG INTO THE SKIN AS DIRECTED DAILY.    Current Outpatient Medications (Analgesics):  Marland Kitchen  Acetaminophen (TYLENOL PO), Take by  mouth as needed. Marland Kitchen  aspirin EC 81 MG tablet, Take 81 mg by mouth daily.  Current Outpatient Medications (Hematological):  .  folic acid (FOLVITE) 1 MG tablet, Take 1 tablet (1 mg total) by mouth 2 (two) times daily.  Current Outpatient Medications (Other):  Marland Kitchen  ACCU-CHEK GUIDE test strip,  .  cholecalciferol (VITAMIN D3) 25 MCG (1000 UT) tablet, Take 1,000 Units by mouth daily. .  clobetasol cream (TEMOVATE) 9.52 %, Apply 1 application topically 2 (two) times daily. As directed; never apply to face. .  Diclofenac Sodium (PENNSAID) 2 % SOLN, Place 2 application onto the skin 2 (two) times daily. Marland Kitchen  Hyaluronan (MONOVISC) 88 MG/4ML SOSY, Inject once into R knee. Inject once into Left knee .  methotrexate 50 MG/2ML injection, INJECT 0.6 MLS UNDER THE SKIN EVERY FRIDAY .  MILK THISTLE-DAND-FENNEL-LICOR PO, Take 1 capsule by mouth daily. .  Omega-3 Fatty Acids (FISH OIL) 1200 MG CAPS, Take 1-2 capsules (1,200-2,400 mg total) by mouth 2 (two) times daily. Takes 2400 mg qam and 1200 mg qpm .  Secukinumab, 300 MG Dose, (COSENTYX SENSOREADY, 300 MG,) 150 MG/ML SOAJ, Inject 150 mg into the skin every 14 (fourteen) days. .  TURMERIC PO, Take by mouth 2 (two) times daily.  Marland Kitchen  UNIFINE PENTIPS 32G X 4 MM MISC,     Past medical history, social, surgical and family history all reviewed in electronic medical record.  No pertanent information unless stated regarding to the chief complaint.   Review of Systems:  No headache, visual changes, nausea, vomiting, diarrhea, constipation, dizziness, abdominal pain, skin rash, fevers, chills, night sweats, weight loss, swollen lymph nodes, body aches, joint swelling,, chest pain, shortness of breath, mood changes.  Positive muscle aches Objective  Blood pressure 120/74, pulse 85, height 5\' 3"  (1.6 m), weight 240 lb (108.9 kg), SpO2 98 %.   General: No apparent distress alert and oriented x3 mood and affect normal, dressed appropriately.  HEENT: Pupils equal,  extraocular movements intact  Respiratory: Patient's speak in full sentences and does not appear short of breath  Cardiovascular: No lower extremity edema, non tender, no erythema  Skin: Warm dry intact with no signs of infection or rash on extremities or on axial skeleton.  Abdomen: Soft nontender  Neuro: Cranial nerves II through XII are intact, neurovascularly intact  in all extremities with 2+ DTRs and 2+ pulses.  Lymph: No lymphadenopathy of posterior or anterior cervical chain or axillae bilaterally.  Gait normal with good balance and coordination.  MSK:  Non tender with full range of motion and good stability and symmetric strength and tone of shoulders, elbows, wrist, hip and ankles bilaterally.  Poor core strength Back Exam:  Inspection: Loss of lordosis Motion: Flexion 45 deg, Extension 25 deg, Side Bending to 45 deg bilaterally,  Rotation to 35 deg bilaterally  SLR laying: Negative  XSLR laying: Negative  Palpable tenderness: Tender to palpation the paraspinal musculature lumbar spine diffusely. FABER: Tightness bilaterally. Sensory change: Gross sensation intact to all lumbar and sacral dermatomes.  Reflexes: 2+ at both patellar tendons, 2+ at achilles tendons, Babinski's downgoing.  Strength at foot  Plantar-flexion: 5/5 Dorsi-flexion: 5/5 Eversion: 5/5 Inversion: 5/5  Leg strength  Quad: 5/5 Hamstring: 5/5 Hip flexor: 5/5 Hip abductors: 5/5 \  Knee: Bilateral valgus deformity noted.  Abnormal thigh to calf ratio.  Tender to palpation over medial and PF joint line.  ROM full in flexion and extension and lower leg rotation. instability with valgus force.  painful patellar compression. Patellar glide with moderate crepitus. Patellar and quadriceps tendons unremarkable. Hamstring and quadriceps strength is normal.  Foot exam shows that patient does have a callus over the fifth toe more on the lateral aspect.  Seems to be more of an abrasion, no signs of any inflammation or  erythema.  Osteopathic findings C2 flexed rotated and side bent right C4 flexed rotated and side bent left C6 flexed rotated and side bent left T3 extended rotated and side bent right inhaled third rib T9 extended rotated and side bent left L2 flexed rotated and side bent right Sacrum right on right    Impression and Recommendations:     This case required medical decision making of moderate complexity. The above documentation has been reviewed and is accurate and complete Lyndal Pulley, DO       Note: This dictation was prepared with Dragon dictation along with smaller phrase technology. Any transcriptional errors that result from this process are unintentional.

## 2018-08-20 ENCOUNTER — Encounter: Payer: Self-pay | Admitting: Family Medicine

## 2018-08-20 ENCOUNTER — Ambulatory Visit: Payer: No Typology Code available for payment source | Admitting: Family Medicine

## 2018-08-20 VITALS — BP 120/74 | HR 85 | Ht 63.0 in | Wt 240.0 lb

## 2018-08-20 DIAGNOSIS — L4050 Arthropathic psoriasis, unspecified: Secondary | ICD-10-CM | POA: Diagnosis not present

## 2018-08-20 DIAGNOSIS — L84 Corns and callosities: Secondary | ICD-10-CM | POA: Insufficient documentation

## 2018-08-20 DIAGNOSIS — M999 Biomechanical lesion, unspecified: Secondary | ICD-10-CM | POA: Diagnosis not present

## 2018-08-20 NOTE — Patient Instructions (Addendum)
Good to see you  Ice is your friend Cream daily on the toe Wart removal cream daily and a band aid for a week then get off excess skin  See me again in 4-6 weeks

## 2018-08-20 NOTE — Assessment & Plan Note (Signed)
Decision today to treat with OMT was based on Physical Exam  After verbal consent patient was treated with HVLA, ME, FPR techniques in cervical, thoracic, rib,  lumbar and sacral areas  Patient tolerated the procedure well with improvement in symptoms  Patient given exercises, stretches and lifestyle modifications  See medications in patient instructions if given  Patient will follow up in 4-8 weeks 

## 2018-08-20 NOTE — Assessment & Plan Note (Signed)
Treated overall doing well.  Some mild increase in erythema of some of the synovial joints noted.  Discussed with patient continue the same treatment.  Responds well to manipulation for some of her other symptoms.  Follow-up again 4 to 8 weeks

## 2018-08-20 NOTE — Assessment & Plan Note (Signed)
Noted left lateral side, steroid cream given, discussed wart remover cream to soften skin and then how to take care of it at home.  Follow-up again in 4 weeks

## 2018-08-21 ENCOUNTER — Other Ambulatory Visit: Payer: Self-pay | Admitting: Pharmacist

## 2018-08-21 ENCOUNTER — Other Ambulatory Visit: Payer: Self-pay | Admitting: Rheumatology

## 2018-08-21 DIAGNOSIS — L4050 Arthropathic psoriasis, unspecified: Secondary | ICD-10-CM

## 2018-08-21 MED ORDER — SECUKINUMAB (300 MG DOSE) 150 MG/ML ~~LOC~~ SOAJ: 150.0000 mg | SUBCUTANEOUS | 0 refills | Status: DC

## 2018-08-21 NOTE — Telephone Encounter (Signed)
Last Visit: 07/15/18 Next visit: 12/19/18 Labs: 07/15/18 WNL TB gold: 01/11/18 Neg   Okay to refill per Dr. Estanislado Pandy

## 2018-08-27 ENCOUNTER — Encounter: Payer: Self-pay | Admitting: Rheumatology

## 2018-08-27 MED FILL — COSENTYX 300 MG DOSE-2 PENS: 150 | 28 days supply | Qty: 2 | Fill #0

## 2018-08-27 MED FILL — METHOTREXATE 25 MG/ML VIAL: 50 | 69 days supply | Qty: 6 | Fill #0

## 2018-08-28 MED FILL — VICTOZA 18 MG/3 ML INJECT P: 18 | 30 days supply | Qty: 9 | Fill #1

## 2018-08-28 MED FILL — FOLIC ACID 1 MG TABS: 1 | 90 days supply | Qty: 180 | Fill #2

## 2018-09-12 ENCOUNTER — Encounter: Payer: Self-pay | Admitting: Rheumatology

## 2018-09-13 NOTE — Telephone Encounter (Signed)
Attempted to contact the patient and left message for patient to call the office. Advised patient provider attestation form is ready. She may pick it up or we can fax.

## 2018-09-19 MED FILL — VICTOZA 18 MG/3 ML INJECT P: 18 | 30 days supply | Qty: 9 | Fill #2

## 2018-09-19 MED FILL — COSENTYX 300 MG DOSE-2 PENS: 150 | 28 days supply | Qty: 2 | Fill #1

## 2018-09-23 ENCOUNTER — Other Ambulatory Visit: Payer: Self-pay

## 2018-09-23 ENCOUNTER — Ambulatory Visit (INDEPENDENT_AMBULATORY_CARE_PROVIDER_SITE_OTHER): Payer: No Typology Code available for payment source | Admitting: Pharmacist

## 2018-09-23 ENCOUNTER — Encounter: Payer: No Typology Code available for payment source | Admitting: Pharmacist

## 2018-09-23 DIAGNOSIS — Z79899 Other long term (current) drug therapy: Secondary | ICD-10-CM

## 2018-09-23 NOTE — Progress Notes (Signed)
   S: Patient presents for review of her specialty medication.  Patient is currently taking Cosentyx for psoriatic arthritis. Patient is managed by Dr. Estanislado Pandy for this.   Adherence: denies any missed doses  Efficacy: reports that it is working well for her and that she does not have any side effects.  Dosing: 150 mg every 14 days  Screening: TB test: completed per patient Hepatitis: history of elevated LFTs, being followed - last labs WNL  Monitoring: S/sx of infection: denies  CBC: WNL in Feb 2020 S/sx of hypersensitivity: denies S/sx of malignancy: denies Latex allergy: does have allergy but has been able to take the medication with only a little bit of redness  O:     Lab Results  Component Value Date   WBC 8.8 07/15/2018   HGB 14.1 07/15/2018   HCT 42.3 07/15/2018   MCV 90.6 07/15/2018   PLT 219 07/15/2018      Chemistry      Component Value Date/Time   NA 141 07/15/2018 1540   K 4.1 07/15/2018 1540   CL 106 07/15/2018 1540   CO2 27 07/15/2018 1540   BUN 20 07/15/2018 1540   CREATININE 0.71 07/15/2018 1540      Component Value Date/Time   CALCIUM 9.6 07/15/2018 1540   ALKPHOS 71 04/05/2017 0817   AST 19 07/15/2018 1540   ALT 25 07/15/2018 1540   BILITOT 0.4 07/15/2018 1540       A/P: 1. Medication review: Patient currently on Cosentyx for psoriatic arthritis and tolerating it well with continued control. Reviewed medication with her, including the following: Cosentyx is a monoclonal antibody used to treat psoriatic arthritis. Possible adverse effects include hypersensitivity, infections, inflammatory bowel disease and injection site reactions. Patient should avoid live vaccinations unless otherwise instructed. Recommend regular lab work with rheumatologist. No recommendations for any changes at this time. 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  Christella Hartigan, PharmD, BCPS, BCACP,  CPP Clinical Pharmacist Practitioner  [phone]

## 2018-09-25 ENCOUNTER — Ambulatory Visit: Payer: No Typology Code available for payment source | Admitting: Family Medicine

## 2018-10-03 ENCOUNTER — Encounter: Payer: Self-pay | Admitting: Family Medicine

## 2018-10-14 NOTE — Progress Notes (Signed)
[name] [address] [place], [place] [address] Phone: [phone] Subjective:   [name], am serving as a scribe for Dr. Hulan Saas.  CC: Back pain  PFX:[id number]  Tabitha [name] is a 53 y.o. female coming in with complaint of back pain. Last seen on 08/20/2018. Patient states that overall she is doing well since last visit. Her knees continue to bother her with physical activity but she has been able to be active recently. Knee pain is better.  Patient was even able to hike 5 miles the other day with very minimal pain.       Past Medical History:  Diagnosis Date   Arthritis    De Quervain's disease (tenosynovitis)    Dermatophytosis, scalp    Diabetes mellitus    Hypercholesteremia    Obesity    Polycystic ovarian disease    Psoriatic arthritis ([place])    Wears glasses    Past Surgical History:  Procedure Laterality Date   CARPAL TUNNEL RELEASE     COLONOSCOPY     DE QUERVAIN'S RELEASE  2012   left   KNEE ARTHROPLASTY     KNEE ARTHROSCOPY Right 05/06/2013   Procedure: RIGHT KNEE ARTHROSCOPY ;  Surgeon: Hessie Dibble, MD;  Location: [facility];  Service: Orthopedics;  Laterality: Right;  partial lateral minisectomy and chondroplasty   WISDOM TOOTH EXTRACTION     WRIST SURGERY  2001   carpel tunnel -rt   Social History   Socioeconomic History   Marital status: Legally Separated    Spouse name: Not on file   Number of children: Not on file   Years of education: Not on file   Highest education level: Not on file  Occupational History   Occupation: nurse    Employer:     Comment: at [employer]  Social Needs   Financial resource strain: Not on file   Food insecurity:    Worry: Not on file    Inability: Not on file   Transportation needs:    Medical: Not on file    Non-medical: Not on file  Tobacco Use   Smoking status: Former Smoker    Packs/day: 1.30    Years:  12.00    Pack years: 15.60    Types: Cigarettes    Start date: 06/12/1984    Last attempt to quit: 03/18/1995    Years since quitting: 23.5   Smokeless tobacco: Never Used   Tobacco comment: Not interested in returning to smoking.   Substance and Sexual Activity   Alcohol use: Yes    Comment: rarely   Drug use: No   Sexual activity: Yes    Partners: Male    Comment: married  Lifestyle   Physical activity:    Days per week: Not on file    Minutes per session: Not on file   Stress: Not on file  Relationships   Social connections:    Talks on phone: Not on file    Gets together: Not on file    Attends religious service: Not on file    Active member of club or organization: Not on file    Attends meetings of clubs or organizations: Not on file    Relationship status: Not on file  Other Topics Concern   Not on file  Social History Narrative   Not on file   Allergies  Allergen Reactions   Chocolate Anaphylaxis   Food  chocolate   Statins     Intolerant  lft elevation   Latex Rash   Family History  Problem Relation Age of Onset   Diabetes Father    Hypertension Father    Stroke Father    Kidney disease Father    Heart disease Father    Cancer Mother 59       breast ca died at age 44   Hypertension Other    Diabetes type II Other    Obesity Other    Obesity Brother    Hypertension Brother    Heart disease Maternal Grandmother    Alzheimer's disease Maternal Grandmother    Diabetes Paternal Grandmother    Heart disease Paternal Grandfather     Current Outpatient Medications (Endocrine & Metabolic):    SYNJARDY XR 12.10-998 MG TB24, 2 tablets daily.    VICTOZA 18 MG/3ML SOPN, INJECT 1.8 MG INTO THE SKIN AS DIRECTED DAILY.    Current Outpatient Medications (Analgesics):    Acetaminophen (TYLENOL PO), Take by mouth as needed.   aspirin EC 81 MG tablet, Take 81 mg by mouth daily.  Current Outpatient Medications  (Hematological):    folic acid (FOLVITE) 1 MG tablet, Take 1 tablet (1 mg total) by mouth 2 (two) times daily.  Current Outpatient Medications (Other):    ACCU-CHEK GUIDE test strip,    cholecalciferol (VITAMIN D3) 25 MCG (1000 UT) tablet, Take 1,000 Units by mouth daily.   clobetasol cream (TEMOVATE) 5.97 %, Apply 1 application topically 2 (two) times daily. As directed; never apply to face.   Diclofenac Sodium (PENNSAID) 2 % SOLN, Place 2 application onto the skin 2 (two) times daily.   Hyaluronan (MONOVISC) 88 MG/4ML SOSY, Inject once into R knee. Inject once into Left knee   methotrexate 50 MG/2ML injection, INJECT 0.6 MLS UNDER THE SKIN EVERY FRIDAY   MILK THISTLE-DAND-FENNEL-LICOR PO, Take 1 capsule by mouth daily.   Omega-3 Fatty Acids (FISH OIL) 1200 MG CAPS, Take 1-2 capsules (1,200-2,400 mg total) by mouth 2 (two) times daily. Takes 2400 mg qam and 1200 mg qpm   Secukinumab, 300 MG Dose, (COSENTYX SENSOREADY, 300 MG,) 150 MG/ML SOAJ, Inject 150 mg into the skin every 14 (fourteen) days.   TURMERIC PO, Take by mouth 2 (two) times daily.    UNIFINE PENTIPS 32G X 4 MM MISC,     Past medical history, social, surgical and family history all reviewed in electronic medical record.  No pertanent information unless stated regarding to the chief complaint.   Review of Systems:  No headache, visual changes, nausea, vomiting, diarrhea, constipation, dizziness, abdominal pain, skin rash, fevers, chills, night sweats, weight loss, swollen lymph nodes, body aches, joint swelling, chest pain, shortness of breath, mood changes.  Positive muscle aches  Objective  Blood pressure 118/82, pulse 97, height 5\' 3"  (1.6 m), weight 228 lb (103.4 kg), SpO2 97 %. S   General: No apparent distress alert and oriented x3 mood and affect normal, dressed appropriately.  HEENT: Pupils equal, extraocular movements intact  Respiratory: Patient's speak in full sentences and does not appear short of  breath  Cardiovascular: No lower extremity edema, non tender, no erythema  Skin: Warm dry intact with no signs of infection or rash on extremities or on axial skeleton.  Abdomen: Soft nontender  Neuro: Cranial nerves II through XII are intact, neurovascularly intact in all extremities with 2+ DTRs and 2+ pulses.  Lymph: No lymphadenopathy of posterior or anterior cervical chain or axillae bilaterally.  Gait normal with good balance and coordination.  MSK:  Non tender with full range of motion and good stability and symmetric strength and tone of shoulders, elbows, wrist, hip, and ankles bilaterally.  Back Exam:  Inspection: Loss of lordosis still poor core strength Motion: Flexion 45 deg, Extension 25 deg, Side Bending to 35 deg bilaterally,  Rotation to 25 deg bilaterally  SLR laying: Negative  XSLR laying: Negative  Palpable tenderness: Outpatient paraspinal musculature lumbar spine right greater than left. FABER: Positive right. Sensory change: Gross sensation intact to all lumbar and sacral dermatomes.  Reflexes: 2+ at both patellar tendons, 2+ at achilles tendons, Babinski's downgoing.  Strength at foot  Plantar-flexion: 5/5 Dorsi-flexion: 5/5 Eversion: 5/5 Inversion: 5/5  Leg strength  Quad: 5/5 Hamstring: 5/5 Hip flexor: 5/5 Hip abductors: 5/5     Osteopathic findings C2 flexed rotated and side bent right T3 extended rotated and side bent right inhaled third rib T7 extended rotated and side bent left L2 flexed rotated and side bent right Sacrum right on right    Impression and Recommendations:     This case required medical decision making of moderate complexity. The above documentation has been reviewed and is accurate and complete Lyndal Pulley, DO       Note: This dictation was prepared with Dragon dictation along with smaller phrase technology. Any transcriptional errors that result from this process are unintentional.

## 2018-10-15 ENCOUNTER — Other Ambulatory Visit: Payer: Self-pay

## 2018-10-15 ENCOUNTER — Ambulatory Visit: Payer: No Typology Code available for payment source | Admitting: Family Medicine

## 2018-10-15 ENCOUNTER — Encounter: Payer: Self-pay | Admitting: Family Medicine

## 2018-10-15 VITALS — BP 118/82 | HR 97 | Ht 63.0 in | Wt 228.0 lb

## 2018-10-15 DIAGNOSIS — G8929 Other chronic pain: Secondary | ICD-10-CM | POA: Diagnosis not present

## 2018-10-15 DIAGNOSIS — M999 Biomechanical lesion, unspecified: Secondary | ICD-10-CM

## 2018-10-15 DIAGNOSIS — M545 Low back pain: Secondary | ICD-10-CM | POA: Diagnosis not present

## 2018-10-15 NOTE — Patient Instructions (Signed)
Good to see you  Keep it up  I am proud of you  See me again in 4-6 weeks

## 2018-10-15 NOTE — Assessment & Plan Note (Signed)
Decision today to treat with OMT was based on Physical Exam  After verbal consent patient was treated with HVLA, ME, FPR techniques in cervical, thoracic, rib lumbar and sacral areas  Patient tolerated the procedure well with improvement in symptoms  Patient given exercises, stretches and lifestyle modifications  See medications in patient instructions if given  Patient will follow up in 4-6 weeks 

## 2018-10-15 NOTE — Assessment & Plan Note (Signed)
Patient is doing better.  Encourage her to continue to lose weight.  Continues her to stay active.  Discussed core strengthening again.  Patient is working with a Clinical research associate on line.  Follow-up again in 4 to 6 weeks

## 2018-10-21 MED FILL — UNIFINE PENTIPS 31GX3/16: 31G X 5 MM | 90 days supply | Qty: 100 | Fill #0

## 2018-10-21 MED FILL — UNIFINE PENTIPS 31GX3/16": 31G X 5 MM | 90 days supply | Qty: 100 | Fill #0

## 2018-10-22 MED FILL — SYNJARDY XR 12.5-1,000 MG T: 12.5-1000 | 90 days supply | Qty: 180 | Fill #1

## 2018-10-22 MED FILL — METHOTREXATE 25 MG/ML VIAL: 50 | 23 days supply | Qty: 2 | Fill #1

## 2018-10-22 MED FILL — VICTOZA 18 MG/3 ML INJECT P: 18 | 30 days supply | Qty: 9 | Fill #3

## 2018-10-22 MED FILL — COSENTYX 300 MG DOSE-2 PENS: 150 | 28 days supply | Qty: 2 | Fill #2

## 2018-10-23 ENCOUNTER — Other Ambulatory Visit: Payer: Self-pay | Admitting: Rheumatology

## 2018-10-23 DIAGNOSIS — L4050 Arthropathic psoriasis, unspecified: Secondary | ICD-10-CM

## 2018-10-23 NOTE — Telephone Encounter (Signed)
Last Visit: 07/15/18 Next visit: 12/19/18 Labs: 07/15/18 WNL  Patient advised she is due for labs. Patient will update this week.   Okay to refill per Dr. Estanislado Pandy

## 2018-10-25 ENCOUNTER — Other Ambulatory Visit: Payer: Self-pay | Admitting: *Deleted

## 2018-10-25 ENCOUNTER — Encounter: Payer: Self-pay | Admitting: Rheumatology

## 2018-10-25 DIAGNOSIS — Z79899 Other long term (current) drug therapy: Secondary | ICD-10-CM

## 2018-10-31 NOTE — Telephone Encounter (Signed)
Spoke with patient and advised her that I have tried re-faxng the lab orders multiple time without success. Asked patient Dr. Cindra Eves first name so I could verify the fax number. Patient verified the doctor is Dr. Minette Brine and there is another fax number listed for him. Faxed orders to that correct number.

## 2018-11-07 ENCOUNTER — Encounter: Payer: Self-pay | Admitting: Internal Medicine

## 2018-11-07 ENCOUNTER — Ambulatory Visit (INDEPENDENT_AMBULATORY_CARE_PROVIDER_SITE_OTHER): Payer: No Typology Code available for payment source | Admitting: Internal Medicine

## 2018-11-07 DIAGNOSIS — J029 Acute pharyngitis, unspecified: Secondary | ICD-10-CM

## 2018-11-07 NOTE — Assessment & Plan Note (Signed)
Likely allergy driven or other viral. Okay to return to work as no fevers and covid-19 testing negative. Advised to take claritin and call back if no improvement in 3-4 days.

## 2018-11-07 NOTE — Progress Notes (Signed)
Virtual Visit via Video Note  I connected with [name] on 11/07/18 at 10:40 AM EDT by a video enabled telemedicine application and verified that I am speaking with the correct person using two identifiers.  The patient and the provider were at separate locations throughout the entire encounter.   I discussed the limitations of evaluation and management by telemedicine and the availability of in person appointments. The patient expressed understanding and agreed to proceed.  History of Present Illness: The patient is a 53 y.o. female with visit for sore throat. Started about 4-5 days ago. 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 She denies SOB or cough or fevers or chills. Some mild nose drainage. Has tried sudafed which did not help and benadryl that did not help. Denies improvement and usually worsens during the day. She is doing honey in beverage which does seem to help and salt water rinses help with the pain. No swelling or pain in neck. No whiteness in the back of throat or tonsils that she sees. Overall it is stable.  Observations/Objective: Appearance: normal, breathing appears normal, casual grooming, abdomen does not appear distended, throat with redness and some drainage but no purulent drainage, memory normal, mental status is A and O times 3, EOM intact  Assessment and Plan: See problem oriented charting  Follow Up Instructions: start claritin for potential allergies and call back on Monday for lack of improvement.   I discussed the assessment and treatment plan with the patient. The patient was provided an opportunity to ask questions and all were answered. The patient agreed with the plan and demonstrated an understanding of the instructions.   The patient was advised to call back or seek an in-person evaluation if the symptoms worsen or if the condition fails to improve as anticipated.  Hoyt Koch, MD

## 2018-11-13 ENCOUNTER — Other Ambulatory Visit: Payer: Self-pay | Admitting: Pharmacist

## 2018-11-13 ENCOUNTER — Other Ambulatory Visit: Payer: Self-pay | Admitting: Rheumatology

## 2018-11-13 MED ORDER — SECUKINUMAB (300 MG DOSE) 150 MG/ML ~~LOC~~ SOAJ: 150.0000 mg | SUBCUTANEOUS | 0 refills | Status: DC

## 2018-11-13 NOTE — Telephone Encounter (Signed)
Last Visit: 07/15/18 Next visit: 12/19/18 Labs: 10/31/18 CBC WNL Creat. 0.59, AST 15,  ALT 21 Tb Gold: 01/11/18 Neg   Patient had her labs done at Dr. Cindra Eves office and is faxing copy to our office.   Okay to refill per Dr. Estanislado Pandy

## 2018-11-18 ENCOUNTER — Telehealth: Payer: Self-pay | Admitting: Pharmacist

## 2018-11-18 NOTE — Telephone Encounter (Signed)
Received a Prior Authorization request from [facility] for [place]. Authorization has been submitted to patient's insurance via Cover My Meds. Will update once we receive a response.  Key: [id number]

## 2018-11-18 NOTE — Telephone Encounter (Signed)
Received a Prior Authorization request from [facility] for [place]. Authorization has been submitted to patient's insurance via Cover My Meds. Will update once we receive a response.  **Called and expedited PA, turnaround time is 24 hours**  Called patient and advised. She does not inject until this weekend. Will follow up.  9:57 AM Beatriz Chancellor, CPhT

## 2018-11-19 ENCOUNTER — Encounter: Payer: Self-pay | Admitting: Family Medicine

## 2018-11-19 ENCOUNTER — Other Ambulatory Visit: Payer: Self-pay

## 2018-11-19 ENCOUNTER — Ambulatory Visit: Payer: No Typology Code available for payment source | Admitting: Family Medicine

## 2018-11-19 VITALS — BP 110/70 | HR 79 | Ht 63.0 in | Wt 228.0 lb

## 2018-11-19 DIAGNOSIS — G8929 Other chronic pain: Secondary | ICD-10-CM

## 2018-11-19 DIAGNOSIS — M545 Low back pain: Secondary | ICD-10-CM

## 2018-11-19 DIAGNOSIS — M999 Biomechanical lesion, unspecified: Secondary | ICD-10-CM

## 2018-11-19 MED FILL — COSENTYX 300 MG DOSE-2 PENS: 150 | 28 days supply | Qty: 2 | Fill #0

## 2018-11-19 NOTE — Patient Instructions (Signed)
You are awesome See me again in 6 weeks

## 2018-11-19 NOTE — Telephone Encounter (Signed)
Received a fax from [facility] regarding a prior authorization for Tabitha [name]. Authorization has been APPROVED from 11/18/2018 to 11/17/2019.   Will send document to scan center.  Authorization # [id number]

## 2018-11-19 NOTE — Assessment & Plan Note (Signed)
Multifactorial.  Patient has lost significant amount of weight.  Working on a more regular basis.  Patient is feeling significantly better overall.  We would like to continue with the osteopathic manipulation but we will decrease frequency to 6-week intervals at this time.  Patient will follow-up with me again in 6 weeks

## 2018-11-19 NOTE — Progress Notes (Signed)
[name] [address] [place], [place] [address] Phone: [phone] Subjective:   I Tabitha [name] am serving as a Education administrator for Dr. Hulan Saas.  CC: Low back pain follow-up  HXT:[id number]  Tabitha [name] is a 53 y.o. female coming in with complaint of back pain. States that her SI joint is doing better. States she has a little pain on the right side.  Patient has been doing relatively well.  Working out on a more regular basis.  Does not feel like she has had any significant flares for any of the autoimmune disease.  Patient's mild swelling of the knees intermittently but nothing that stops from activity.    Past Medical History:  Diagnosis Date  . Arthritis   . De Quervain's disease (tenosynovitis)   . Dermatophytosis, scalp   . Diabetes mellitus   . Hypercholesteremia   . Obesity   . Polycystic ovarian disease   . Psoriatic arthritis ([place])   . Wears glasses    Past Surgical History:  Procedure Laterality Date  . CARPAL TUNNEL RELEASE    . COLONOSCOPY    . DE QUERVAIN'S RELEASE  2012   left  . KNEE ARTHROPLASTY    . KNEE ARTHROSCOPY Right 05/06/2013   Procedure: RIGHT KNEE ARTHROSCOPY ;  Surgeon: Hessie Dibble, MD;  Location: [facility];  Service: Orthopedics;  Laterality: Right;  partial lateral minisectomy and chondroplasty  . WISDOM TOOTH EXTRACTION    . WRIST SURGERY  2001   carpel tunnel -rt   Social History   Socioeconomic History  . Marital status: Legally Separated    Spouse name: Not on file  . Number of children: Not on file  . Years of education: Not on file  . Highest education level: Not on file  Occupational History  . 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 Financial resource strain: Not on file  . Food insecurity:    Worry: Not on file    Inability: Not on file  . Transportation needs:    Medical: Not on file    Non-medical: Not on file   Tobacco Use  . Smoking status: Former Smoker    Packs/day: 1.30    Years: 12.00    Pack years: 15.60    Types: Cigarettes    Start date: 06/12/1984    Last attempt to quit: 03/18/1995    Years since quitting: 23.6  . Smokeless tobacco: Never Used  . Tobacco comment: Not interested in returning to smoking.   Substance and Sexual Activity  . Alcohol use: Yes    Comment: rarely  . Drug use: No  . Sexual activity: Yes    Partners: Male    Comment: married  Lifestyle  . Physical activity:    Days per week: Not on file    Minutes per session: Not on file  . Stress: Not on file  Relationships  . Social connections:    Talks on phone: Not on file    Gets together: Not on file    Attends religious service: Not on file    Active member of club or organization: Not on file    Attends meetings of clubs or organizations: Not on file    Relationship status: Not on file  Other Topics Concern  . Not on file  Social History Narrative  . Not on file   Allergies  Allergen  Reactions  . Chocolate Anaphylaxis  . Food     chocolate  . Statins     Intolerant  lft elevation  . Latex Rash   Family History  Problem Relation Age of Onset  . Diabetes Father   . Hypertension Father   . Stroke Father   . Kidney disease Father   . Heart disease Father   . Cancer Mother 40       breast ca died at age 35  . Hypertension Other   . Diabetes type II Other   . Obesity Other   . Obesity Brother   . Hypertension Brother   . Heart disease Maternal Grandmother   . Alzheimer's disease Maternal Grandmother   . Diabetes Paternal Grandmother   . Heart disease Paternal Grandfather     Current Outpatient Medications (Endocrine & Metabolic):  .  SYNJARDY XR 12.10-998 MG TB24, 2 tablets daily.  Marland Kitchen  VICTOZA 18 MG/3ML SOPN, INJECT 1.8 MG INTO THE SKIN AS DIRECTED DAILY.    Current Outpatient Medications (Analgesics):  Marland Kitchen  Acetaminophen (TYLENOL PO), Take by mouth as needed. Marland Kitchen  aspirin EC 81 MG  tablet, Take 81 mg by mouth daily.  Current Outpatient Medications (Hematological):  .  folic acid (FOLVITE) 1 MG tablet, Take 1 tablet (1 mg total) by mouth 2 (two) times daily.  Current Outpatient Medications (Other):  Marland Kitchen  ACCU-CHEK GUIDE test strip,  .  cholecalciferol (VITAMIN D3) 25 MCG (1000 UT) tablet, Take 1,000 Units by mouth daily. .  clobetasol cream (TEMOVATE) 1.61 %, Apply 1 application topically 2 (two) times daily. As directed; never apply to face. .  Diclofenac Sodium (PENNSAID) 2 % SOLN, Place 2 application onto the skin 2 (two) times daily. .  methotrexate 50 MG/2ML injection, INJECT 0.6 MLS UNDER THE SKIN EVERY FRIDAY .  MILK THISTLE-DAND-FENNEL-LICOR PO, Take 1 capsule by mouth daily. .  Omega-3 Fatty Acids (FISH OIL) 1200 MG CAPS, Take 1-2 capsules (1,200-2,400 mg total) by mouth 2 (two) times daily. Takes 2400 mg qam and 1200 mg qpm .  Secukinumab, 300 MG Dose, (COSENTYX SENSOREADY, 300 MG,) 150 MG/ML SOAJ, Inject 150 mg into the skin every 14 (fourteen) days. .  TURMERIC PO, Take by mouth 2 (two) times daily.  Marland Kitchen  UNIFINE PENTIPS 32G X 4 MM MISC,     Past medical history, social, surgical and family history all reviewed in electronic medical record.  No pertanent information unless stated regarding to the chief complaint.   Review of Systems:  No headache, visual changes, nausea, vomiting, diarrhea, constipation, dizziness, abdominal pain, skin rash, fevers, chills, night sweats, weight loss, swollen lymph nodes, body aches, joint swelling, chest pain, shortness of breath, mood changes.  Positive muscle aches  Objective  Blood pressure 110/70, pulse 79, height 5\' 3"  (1.6 m), weight 228 lb (103.4 kg), SpO2 98 %.    General: No apparent distress alert and oriented x3 mood and affect normal, dressed appropriately.  HEENT: Pupils equal, extraocular movements intact  Respiratory: Patient's speak in full sentences and does not appear short of breath  Cardiovascular: No  lower extremity edema, non tender, no erythema  Skin: Warm dry intact with no signs of infection or rash on extremities or on axial skeleton.  Abdomen: Soft nontender  Neuro: Cranial nerves II through XII are intact, neurovascularly intact in all extremities with 2+ DTRs and 2+ pulses.  Lymph: No lymphadenopathy of posterior or anterior cervical chain or axillae bilaterally.  Gait normal with good balance  and coordination.  MSK:  tender with full range of motion and good stability and symmetric strength and tone of shoulders, elbows, wrist, hip, knee and ankles bilaterally.  Back Exam:  Inspection: Unremarkable  Motion: Flexion 45 deg, Extension 25 deg, Side Bending to 35 deg bilaterally,  Rotation to 45 deg bilaterally  SLR laying: Negative  XSLR laying: Negative  Palpable tenderness: Tender to palpation the paraspinal musculature lumbar spine right greater than left. FABER: negative. Sensory change: Gross sensation intact to all lumbar and sacral dermatomes.  Reflexes: 2+ at both patellar tendons, 2+ at achilles tendons, Babinski's downgoing.  Strength at foot  Plantar-flexion: 5/5 Dorsi-flexion: 5/5 Eversion: 5/5 Inversion: 5/5  Leg strength  Quad: 5/5 Hamstring: 5/5 Hip flexor: 5/5 Hip abductors: 5/5    Osteopathic findings C6 flexed rotated and side bent left T3 extended rotated and side bent right inhaled third rib T7 extended rotated and side bent left L2 flexed rotated and side bent right Sacrum right on right    Impression and Recommendations:     This case required medical decision making of moderate complexity. The above documentation has been reviewed and is accurate and complete Lyndal Pulley, DO       Note: This dictation was prepared with Dragon dictation along with smaller phrase technology. Any transcriptional errors that result from this process are unintentional.

## 2018-11-19 NOTE — Assessment & Plan Note (Signed)
Decision today to treat with OMT was based on Physical Exam  After verbal consent patient was treated with HVLA, ME, FPR techniques in cervical, thoracic, rib lumbar and sacral areas  Patient tolerated the procedure well with improvement in symptoms  Patient given exercises, stretches and lifestyle modifications  See medications in patient instructions if given  Patient will follow up in 6 weeks 

## 2018-11-22 MED FILL — FREESTYLE LITE METER: 30 days supply | Qty: 1 | Fill #0

## 2018-11-22 MED FILL — FREESTYLE LITE TEST STRIP: 90 days supply | Qty: 100 | Fill #0

## 2018-11-29 ENCOUNTER — Encounter: Payer: Self-pay | Admitting: Rheumatology

## 2018-11-29 DIAGNOSIS — L4050 Arthropathic psoriasis, unspecified: Secondary | ICD-10-CM

## 2018-11-29 MED ORDER — METHOTREXATE SODIUM CHEMO INJECTION 50 MG/2ML: INTRAMUSCULAR | 0 refills | Status: DC

## 2018-11-29 MED FILL — METHOTREXATE 25 MG/ML VIAL: 50 | 69 days supply | Qty: 6 | Fill #0

## 2018-11-29 NOTE — Telephone Encounter (Signed)
Last Visit: 07/15/18 Next visit: 12/19/18 Labs: 10/31/18 CBC WNL Creat. 0.59, AST 15,  ALT 21  Okay to refill per Dr. Estanislado Pandy

## 2018-12-06 NOTE — Progress Notes (Signed)
Office Visit Note  Patient: Tabitha [name]             Date of Birth: 04-28-1966           MRN: [id number]             PCP: Hoyt Koch, MD Referring: Hoyt Koch, * Visit Date: 12/19/2018 Occupation: @GUAROCC @  Subjective:  Left knee joint pain     History of Present Illness: Tabitha [name] is a 53 y.o. female with history of psoriatic arthritis and osteoarthritis. She is on Cosentyx 150 mg sq every 14 days, MTX 0.6 ml sq once weekly, and folic acid 2 mg po daily. She has not missed any doses recently.  She reports about 2 weeks ago she developed worsening fatigue, muscle soreness, and joint pain.  She reports she was having increased pain in both hands and both elbow joints.  She states she noticed inflammation in both hands, elbows, and both knee joints.  She states the pain and swelling has subsided over the past 3 days.  She states during that period she also had a bout of palpitations.  She was evaluated by her PCP and has a holter monitor on the way. She has not had any more episodes of palpitations.    Activities of Daily Living:  Patient reports morning stiffness for 30-60 minutes.   Patient Reports nocturnal pain.  Difficulty dressing/grooming: Denies Difficulty climbing stairs: Reports Difficulty getting out of chair: Denies Difficulty using hands for taps, buttons, cutlery, and/or writing: Reports  Review of Systems  Constitutional: Positive for fatigue.  HENT: Negative for mouth sores, mouth dryness and nose dryness.   Eyes: Positive for redness, itching and dryness. Negative for pain and visual disturbance.  Respiratory: Negative for cough, hemoptysis, shortness of breath, wheezing and difficulty breathing.   Cardiovascular: Negative for chest pain, palpitations, hypertension and swelling in legs/feet.  Gastrointestinal: Negative for abdominal pain, blood in stool, constipation and diarrhea.  Endocrine: Negative for increased urination.  Genitourinary:  Negative for painful urination and pelvic pain.  Musculoskeletal: Positive for arthralgias, joint pain, joint swelling and morning stiffness. Negative for myalgias, muscle weakness, muscle tenderness and myalgias.  Skin: Negative for color change, pallor, rash, hair loss, nodules/bumps, redness, skin tightness, ulcers and sensitivity to sunlight.  Allergic/Immunologic: Negative for susceptible to infections.  Neurological: Negative for dizziness, numbness, headaches and memory loss.  Hematological: Negative for bruising/bleeding tendency and swollen glands.  Psychiatric/Behavioral: Negative for depressed mood, confusion and sleep disturbance. The patient is not nervous/anxious.     PMFS History:  Patient Active Problem List   Diagnosis Date Noted  . Palpitations 12/12/2018  . Sore throat 11/07/2018  . Foot callus 08/20/2018  . Left hand pain 08/01/2018  . Ear symptom 04/22/2018  . Nonallopathic lesion of lumbosacral region 03/14/2018  . Back pain 12/21/2017  . Nonallopathic lesion of cervical region 12/21/2017  . Biceps tendinitis of left shoulder 11/14/2017  . Primary osteoarthritis of both hands 10/30/2017  . Primary osteoarthritis of both feet 10/30/2017  . Rash 09/17/2017  . Routine general medical examination at a health care facility 04/05/2017  . Degenerative arthritis of knee, bilateral 12/20/2016  . Nonallopathic lesion of sacral region 10/04/2013  . Nonallopathic lesion of thoracic region 10/04/2013  . Abnormal mammogram 07/04/2012  . Obesity 05/28/2012  . Psoriatic arthritis ([place]) 03/12/2012  . Diabetes mellitus due to underlying condition with hyperglycemia ([place]) 10/03/2011  . Psoriasis 03/30/2011  . Hyperlipidemia associated with type 2 diabetes  mellitus ([place]) 05/25/2009  . Nonallopathic lesion of rib cage 05/25/2009  . POLYCYSTIC OVARIES 04/07/2009    Past Medical History:  Diagnosis Date  . Arthritis   . De Quervain's disease (tenosynovitis)   .  Dermatophytosis, scalp   . Diabetes mellitus   . Hypercholesteremia   . Obesity   . Polycystic ovarian disease   . Psoriatic arthritis ([place])   . Wears glasses     Family History  Problem Relation Age of Onset  . Diabetes Father   . Hypertension Father   . Stroke Father   . Kidney disease Father   . Heart disease Father   . Cancer Mother 23       breast ca died at age 64  . Hypertension Other   . Diabetes type II Other   . Obesity Other   . Obesity Brother   . Hypertension Brother   . Heart disease Maternal Grandmother   . Alzheimer's disease Maternal Grandmother   . Diabetes Paternal Grandmother   . Heart disease Paternal Grandfather    Past Surgical History:  Procedure Laterality Date  . CARPAL TUNNEL RELEASE    . COLONOSCOPY    . DE QUERVAIN'S RELEASE  2012   left  . KNEE ARTHROPLASTY    . KNEE ARTHROSCOPY Right 05/06/2013   Procedure: RIGHT KNEE ARTHROSCOPY ;  Surgeon: Hessie Dibble, MD;  Location: [facility];  Service: Orthopedics;  Laterality: Right;  partial lateral minisectomy and chondroplasty  . WISDOM TOOTH EXTRACTION    . WRIST SURGERY  2001   carpel tunnel -rt   Social History   Social History Narrative  . Not on file   Immunization History  Administered Date(s) Administered  . Influenza,inj,Quad PF,6+ Mos 03/26/2014  . Influenza-Unspecified 04/11/2015, 03/26/2017, 03/26/2018  . Tdap 06/12/2010     Objective: Vital Signs: BP 114/74 (BP Location: Left Arm, Patient Position: Sitting, Cuff Size: Normal)   Pulse 74   Resp 14   Ht 5\' 3"  (1.6 m)   Wt 225 lb 9.6 oz (102.3 kg)   BMI 39.96 kg/m    Physical Exam Vitals signs and nursing note reviewed.  Constitutional:      Appearance: She is well-developed.  HENT:     Head: Normocephalic and atraumatic.  Eyes:     Conjunctiva/sclera: Conjunctivae normal.  Neck:     Musculoskeletal: Normal range of motion.  Cardiovascular:     Rate and Rhythm: Normal rate and regular rhythm.      Heart sounds: Normal heart sounds.  Pulmonary:     Effort: Pulmonary effort is normal.     Breath sounds: Normal breath sounds.  Abdominal:     General: Bowel sounds are normal.     Palpations: Abdomen is soft.  Lymphadenopathy:     Cervical: No cervical adenopathy.  Skin:    General: Skin is warm and dry.     Capillary Refill: Capillary refill takes less than 2 seconds.  Neurological:     Mental Status: She is alert and oriented to person, place, and time.  Psychiatric:        Behavior: Behavior normal.      Musculoskeletal Exam: C-spine, thoracic spine, and lumbar spine good ROM.  No midline spinal tenderness.  No SI joint tenderness.  Shoulder joints, elbow joints, wrist joints, MCPs, PIPs, and DIPs good ROM.  PIP and DIP synovial thickening but no synovitis.  Hip joints good ROM with no discomfort. Right knee has good ROM with no  warmth or effusion.  Left knee warmth but no effusion.  Ankle joints good ROM with no warmth or effusion.  No achilles tendonitis or plantar fasciitis.    CDAI Exam: CDAI Score: 0.6  Patient Global: 3 mm; Provider Global: 3 mm Swollen: 0 ; Tender: 0  Joint Exam   No joint exam has been documented for this visit   There is currently no information documented on the homunculus. Go to the Rheumatology activity and complete the homunculus joint exam.  Investigation: No additional findings.  Imaging: No results found.  Recent Labs: Lab Results  Component Value Date   WBC 8.3 12/11/2018   HGB 15.1 (H) 12/11/2018   PLT 236.0 12/11/2018   NA 141 07/15/2018   K 4.1 07/15/2018   CL 106 07/15/2018   CO2 27 07/15/2018   GLUCOSE 85 07/15/2018   BUN 20 07/15/2018   CREATININE 0.71 07/15/2018   BILITOT 0.4 07/15/2018   ALKPHOS 71 04/05/2017   AST 19 07/15/2018   ALT 25 07/15/2018   PROT 7.1 07/15/2018   ALBUMIN 3.7 04/05/2017   CALCIUM 9.6 07/15/2018   GFRAA 113 07/15/2018   QFTBGOLD Negative 01/09/2017   QFTBGOLDPLUS NEGATIVE 01/11/2018     Speciality Comments: Failed Humira/Remicade  Procedures:  No procedures performed Allergies: Chocolate, Food, Statins, and Latex   Assessment / Plan:     Visit Diagnoses: Psoriatic arthritis ([place]) - She has no synovitis or dactylitis.  She has PIP and DIP synovial thickening consistent with osteoarthritis.  She reports 2 weeks ago she was having increased pain in both hands, both elbow joints, and both knee joints. Her joint pain and inflammation has subsided over the past 3 days.  She has warmth of the left knee joint on exam but no effusion noted.  She is clinically doing well on Cosentyx 150 mg sq injections every 14 days, MTX 0.6 ml sq once weekly, and folic acid 2 mg po daily.  She will continue on this current treatment regimen.  She was advised to notify us if she develops increased joint pain or joint swelling.  She will follow up in 5 months.   Psoriasis - She has no active psoriasis at this time.  She occasionally has psoriasis along her hairline. She requested a refill of Clobetasol foam to apply topically as needed.   High risk medications (not anticoagulants) long-term use -Cosentyx 150 mg every 14 days, methotrexate 0.6 ml every 7 days, and folic acid 1 mg 1 tablet twice daily.  Last TB gold negative on 01/11/2018.  Due for TB Gold in August and future order placed.  Will monitor yearly.  Most recent CBC/CMP within normal limits on 10/31/2018.  Due for CBC/CMP in August and will monitor every 3 months.  Standing orders are in place.  She received a flu vaccine in October.    (failed Humira and Remicade).  - Plan: QuantiFERON-TB Gold Plus  Erythema nodosum - Resolved   Primary osteoarthritis of both hands -She has PIP and DIP synovial thickening consistent with osteoarthritis of both hands.  She has no synovitis or tenderness on exam today.  She has complete fist formation bilaterally.  Joint protection and muscle strengthening were discussed.   Primary osteoarthritis of both knees -  She has good ROM with no discomfort.  Left knee warmth but no effusion noted.  Right knee no warmth or effusion.  She sees Dr. Tamala Julian for left knee joint pain.  She has had inadequate responses to cortisone injections and visco  gel injections in the past.  She is considering PRP injections in the future.   Patellofemoral arthritis of left knee -   Primary osteoarthritis of both feet - She has osteoarthritic changes in both feet.  She has no tenderness or discomfort at this time.  She is wearing proper fitting shoes.   Other medical conditions are listed as follows:   History of diabetes mellitus   History of PCOS   History of hyperlipidemia   Orders: Orders Placed This Encounter  Procedures  . QuantiFERON-TB Gold Plus   Meds ordered this encounter  Medications  . Clobetasol Propionate Emulsion 0.05 % topical foam    Sig: Apply 1 application topically 2 (two) times daily.    Dispense:  100 g    Refill:  0     Follow-Up Instructions: Return in about 5 months (around 05/21/2019) for Psoriatic arthritis, Osteoarthritis.   Ofilia Neas, PA-C   I examined and evaluated the patient with Hazel Sams PA. The plan of care was discussed as noted above.  Bo Merino, MD  Note - This record has been created using Editor, commissioning.  Chart creation errors have been sought, but may not always  have been located. Such creation errors do not reflect on  the standard of medical care.

## 2018-12-10 ENCOUNTER — Ambulatory Visit: Payer: Self-pay | Admitting: Internal Medicine

## 2018-12-10 NOTE — Telephone Encounter (Signed)
Patient scheduled for tomorrow

## 2018-12-10 NOTE — Telephone Encounter (Signed)
Pt. Reports she has has noticed some heart fluttering since Sunday. Comes and goes, lasts a few seconds. Denies any chest pain, shortness of breath or dizziness. Working out without difficulty. Pulse is 70 this morning. Warm transfer to [facility] in the practice for a visit.  Answer Assessment - Initial Assessment Questions 1. DESCRIPTION: "Please describe your heart rate or heart beat that you are having" (e.g., fast/slow, regular/irregular, skipped or extra beats, "palpitations")     Fluttery 2. ONSET: "When did it start?" (Minutes, hours or days)      Sunday 3. DURATION: "How long does it last" (e.g., seconds, minutes, hours)     Lasts a few minutes 4. PATTERN "Does it come and go, or has it been constant since it started?"  "Does it get worse with exertion?"   "Are you feeling it now?"     Comes and goes 5. TAP: "Using your hand, can you tap out what you are feeling on a chair or table in front of you, so that I can hear?" (Note: not all patients can do this)       No 6. HEART RATE: "Can you tell me your heart rate?" "How many beats in 15 seconds?"  (Note: not all patients can do this)       Feels regular- 70 7. RECURRENT SYMPTOM: "Have you ever had this before?" If so, ask: "When was the last time?" and "What happened that time?"      No 8. CAUSE: "What do you think is causing the palpitations?"     Unsure 9. CARDIAC HISTORY: "Do you have any history of heart disease?" (e.g., heart attack, angina, bypass surgery, angioplasty, arrhythmia)      No 10. OTHER SYMPTOMS: "Do you have any other symptoms?" (e.g., dizziness, chest pain, sweating, difficulty breathing)       No 11. PREGNANCY: "Is there any chance you are pregnant?" "When was your last menstrual period?"       no  Protocols used: HEART RATE AND HEARTBEAT QUESTIONS-A-AH

## 2018-12-11 ENCOUNTER — Ambulatory Visit (INDEPENDENT_AMBULATORY_CARE_PROVIDER_SITE_OTHER): Payer: No Typology Code available for payment source | Admitting: Internal Medicine

## 2018-12-11 ENCOUNTER — Encounter: Payer: Self-pay | Admitting: Internal Medicine

## 2018-12-11 ENCOUNTER — Other Ambulatory Visit (INDEPENDENT_AMBULATORY_CARE_PROVIDER_SITE_OTHER): Payer: No Typology Code available for payment source

## 2018-12-11 ENCOUNTER — Other Ambulatory Visit: Payer: Self-pay

## 2018-12-11 VITALS — BP 110/62 | HR 78 | Temp 98.7°F | Ht 63.0 in | Wt 224.0 lb

## 2018-12-11 DIAGNOSIS — R002 Palpitations: Secondary | ICD-10-CM | POA: Diagnosis not present

## 2018-12-11 LAB — CBC
HCT: 45.7 % (ref 36.0–46.0)
Hemoglobin: 15.1 g/dL — ABNORMAL HIGH (ref 12.0–15.0)
MCHC: 33 g/dL (ref 30.0–36.0)
MCV: 93.4 fl (ref 78.0–100.0)
Platelets: 236 10*3/uL (ref 150.0–400.0)
RBC: 4.89 Mil/uL (ref 3.87–5.11)
RDW: 13.9 % (ref 11.5–15.5)
WBC: 8.3 10*3/uL (ref 4.0–10.5)

## 2018-12-11 LAB — T4, FREE: Free T4: 0.8 ng/dL (ref 0.60–1.60)

## 2018-12-11 LAB — TSH: TSH: 1.02 u[IU]/mL (ref 0.35–4.50)

## 2018-12-11 NOTE — Progress Notes (Signed)
   Subjective:   Patient ID: Tabitha [name], female    DOB: 10/03/1965, 53 y.o.   MRN: [id number]  HPI The patient is a 53 YO female coming in for heart fluttering. Started about 3-4 days ago. She denies change in exercise. She has changed from more animal proteins to more plant based proteins. Denies missing meals or poor hydration. Working with trainer some. She denies change in caffeine or sleep. Denies excessive stress. Denies dark stools and no longer has menstrual cycles. Not current smoker and quit more than 10 years ago. She is not having it bad right now but it is lasting daily for hours per day. Feels fast and sometimes a little irregular.   Review of Systems  Constitutional: Negative.   HENT: Negative.   Eyes: Negative.   Respiratory: Negative for cough, chest tightness and shortness of breath.   Cardiovascular: Positive for palpitations. Negative for chest pain and leg swelling.  Gastrointestinal: Negative for abdominal distention, abdominal pain, constipation, diarrhea, nausea and vomiting.  Musculoskeletal: Negative.   Skin: Negative.   Neurological: Negative.   Psychiatric/Behavioral: Negative.     Objective:  Physical Exam Constitutional:      Appearance: She is well-developed.  HENT:     Head: Normocephalic and atraumatic.  Neck:     Musculoskeletal: Normal range of motion.  Cardiovascular:     Rate and Rhythm: Normal rate and regular rhythm.  Pulmonary:     Effort: Pulmonary effort is normal. No respiratory distress.     Breath sounds: Normal breath sounds. No wheezing or rales.  Abdominal:     General: Bowel sounds are normal. There is no distension.     Palpations: Abdomen is soft.     Tenderness: There is no abdominal tenderness. There is no rebound.  Skin:    General: Skin is warm and dry.  Neurological:     Mental Status: She is alert and oriented to person, place, and time.     Coordination: Coordination normal.     Vitals:   12/11/18 1031  BP: 110/62   Pulse: 78  Temp: 98.7 F (37.1 C)  TempSrc: Oral  SpO2: 96%  Weight: 224 lb (101.6 kg)  Height: 5\' 3"  (1.6 m)   EKG: Rate 73, axis normal, intervals normal, sinus, no st or t wave changes, no significant change sine 2014.  Assessment & Plan:  Visit time 25 minutes: greater than 50% of that time was spent in face to face counseling and coordination of care with the patient: counseled about palpitations and causes as well as options for treatment if needed after monitor to diagnose the cause.

## 2018-12-11 NOTE — Patient Instructions (Signed)
We will check the blood work today and get the monitor done.

## 2018-12-12 ENCOUNTER — Encounter: Payer: Self-pay | Admitting: Family Medicine

## 2018-12-12 DIAGNOSIS — R002 Palpitations: Secondary | ICD-10-CM | POA: Insufficient documentation

## 2018-12-12 NOTE — Assessment & Plan Note (Signed)
EKG normal without PVCs although she was not having palpitations. Recent labs from May reviewed from rheumatology. Rechecking CBC and thyroid function for cause. Ordered holter monitor to check etiology of these. Talked to her about most common causes and treatment with increasing fluids, avoiding caffeine or new otc vitamins.

## 2018-12-16 ENCOUNTER — Telehealth: Payer: Self-pay | Admitting: *Deleted

## 2018-12-16 NOTE — Telephone Encounter (Signed)
3 day ZIO XT long term holter monitor to be mailed to the patients home.  Instructions reviewed briefly as they are included in the monitor kit.  Patient aware Dr. Sharlet Salina request her to wear monitor at least 48 hours.

## 2018-12-17 ENCOUNTER — Encounter: Payer: Self-pay | Admitting: Internal Medicine

## 2018-12-17 MED FILL — COSENTYX 300 MG DOSE-2 PENS: 150 | 28 days supply | Qty: 2 | Fill #1

## 2018-12-17 NOTE — Progress Notes (Signed)
Abstracted and sent to scan  

## 2018-12-18 ENCOUNTER — Encounter: Payer: Self-pay | Admitting: Internal Medicine

## 2018-12-19 ENCOUNTER — Encounter: Payer: Self-pay | Admitting: Physician Assistant

## 2018-12-19 ENCOUNTER — Ambulatory Visit: Payer: No Typology Code available for payment source | Admitting: Physician Assistant

## 2018-12-19 ENCOUNTER — Other Ambulatory Visit: Payer: Self-pay

## 2018-12-19 VITALS — BP 114/74 | HR 74 | Resp 14 | Ht 63.0 in | Wt 225.6 lb

## 2018-12-19 DIAGNOSIS — M19042 Primary osteoarthritis, left hand: Secondary | ICD-10-CM

## 2018-12-19 DIAGNOSIS — Z8639 Personal history of other endocrine, nutritional and metabolic disease: Secondary | ICD-10-CM

## 2018-12-19 DIAGNOSIS — Z8742 Personal history of other diseases of the female genital tract: Secondary | ICD-10-CM

## 2018-12-19 DIAGNOSIS — Z79899 Other long term (current) drug therapy: Secondary | ICD-10-CM | POA: Diagnosis not present

## 2018-12-19 DIAGNOSIS — L52 Erythema nodosum: Secondary | ICD-10-CM | POA: Diagnosis not present

## 2018-12-19 DIAGNOSIS — L409 Psoriasis, unspecified: Secondary | ICD-10-CM | POA: Diagnosis not present

## 2018-12-19 DIAGNOSIS — M19041 Primary osteoarthritis, right hand: Secondary | ICD-10-CM

## 2018-12-19 DIAGNOSIS — L4050 Arthropathic psoriasis, unspecified: Secondary | ICD-10-CM | POA: Diagnosis not present

## 2018-12-19 DIAGNOSIS — M19072 Primary osteoarthritis, left ankle and foot: Secondary | ICD-10-CM

## 2018-12-19 DIAGNOSIS — M19071 Primary osteoarthritis, right ankle and foot: Secondary | ICD-10-CM

## 2018-12-19 DIAGNOSIS — M1712 Unilateral primary osteoarthritis, left knee: Secondary | ICD-10-CM

## 2018-12-19 DIAGNOSIS — M170 Bilateral primary osteoarthritis of knee: Secondary | ICD-10-CM

## 2018-12-19 MED ORDER — CLOBETASOL PROPIONATE EMULSION 0.05 % EX FOAM: 1.0000 "application " | Freq: Two times a day (BID) | CUTANEOUS | 0 refills | Status: DC

## 2018-12-19 MED ORDER — NEOMYCIN-POLYMYXIN-HC 3.5-10000-1 OT SUSP: 3.0000 [drp] | Freq: Three times a day (TID) | OTIC | 0 refills | Status: DC

## 2018-12-19 MED FILL — NEO/POLYMYXIN/HC EAR SUSP: 3.5-10000-1 | 15 days supply | Qty: 10 | Fill #0

## 2018-12-19 NOTE — Patient Instructions (Signed)
Standing Labs We placed an order today for your standing lab work.    Please come back and get your standing labs in August and every 3 months   TB gold, CBC, CMP   We have open lab daily Monday through Thursday from 8:30-12:30 PM and 1:30-4:30 PM and Friday from 8:30-12:30 PM and 1:30 -4:00 PM at the office of Dr. Bo Merino.   You may experience shorter wait times on Monday and Friday afternoons. The office is located at [address] [place], [place], [place] [address] No appointment is necessary.   Labs are drawn by [facility].  You may receive a bill from [facility] for your lab work.  If you wish to have your labs drawn at another location, please call the office 24 hours in advance to send orders.  If you have any questions regarding directions or hours of operation,  please call [phone].   Just as a reminder please drink plenty of water prior to coming for your lab work. Thanks!

## 2018-12-24 ENCOUNTER — Encounter: Payer: Self-pay | Admitting: Family Medicine

## 2018-12-24 ENCOUNTER — Ambulatory Visit (INDEPENDENT_AMBULATORY_CARE_PROVIDER_SITE_OTHER): Payer: No Typology Code available for payment source | Admitting: Family Medicine

## 2018-12-24 ENCOUNTER — Other Ambulatory Visit: Payer: Self-pay

## 2018-12-24 ENCOUNTER — Ambulatory Visit: Payer: Self-pay

## 2018-12-24 ENCOUNTER — Other Ambulatory Visit: Payer: No Typology Code available for payment source

## 2018-12-24 VITALS — BP 130/80 | HR 84 | Ht 63.0 in | Wt 229.0 lb

## 2018-12-24 DIAGNOSIS — M25561 Pain in right knee: Secondary | ICD-10-CM

## 2018-12-24 DIAGNOSIS — G8929 Other chronic pain: Secondary | ICD-10-CM

## 2018-12-24 DIAGNOSIS — R768 Other specified abnormal immunological findings in serum: Secondary | ICD-10-CM

## 2018-12-24 DIAGNOSIS — L4050 Arthropathic psoriasis, unspecified: Secondary | ICD-10-CM

## 2018-12-24 NOTE — Assessment & Plan Note (Signed)
PRP given.  Discussed icing regimen and home exercise.  Discussed which activities of doing which wants to avoid.  Discussed posture mechanics.  Follow-up in 4 to 3 weeks

## 2018-12-24 NOTE — Progress Notes (Signed)
Tabitha [name], [place] [address] Phone: [phone] Subjective:   I Tabitha [name] am serving as a Education administrator for Dr. Hulan Saas.  I'm seeing this patient by the request  of:    CC: Knee pain:  [id number]  Tabitha [name] is a 53 y.o. female coming in with complaint of left knee pain. PRP.      Past Medical History:  Diagnosis Date  . Arthritis   . De Quervain's disease (tenosynovitis)   . Dermatophytosis, scalp   . Diabetes mellitus   . Hypercholesteremia   . Obesity   . Polycystic ovarian disease   . Psoriatic arthritis ([place])   . Wears glasses    Past Surgical History:  Procedure Laterality Date  . CARPAL TUNNEL RELEASE    . COLONOSCOPY    . DE QUERVAIN'S RELEASE  2012   left  . KNEE ARTHROPLASTY    . KNEE ARTHROSCOPY Right 05/06/2013   Procedure: RIGHT KNEE ARTHROSCOPY ;  Surgeon: Hessie Dibble, MD;  Location: [facility];  Service: Orthopedics;  Laterality: Right;  partial lateral minisectomy and chondroplasty  . WISDOM TOOTH EXTRACTION    . WRIST SURGERY  2001   carpel tunnel -rt   Social History   Socioeconomic History  . Marital status: Legally Separated    Spouse name: Not on file  . Number of children: Not on file  . Years of education: Not on file  . Highest education level: Not on file  Occupational History  . 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 Financial resource strain: Not on file  . Food insecurity    Worry: Not on file    Inability: Not on file  . Transportation needs    Medical: Not on file    Non-medical: Not on file  Tobacco Use  . Smoking status: Former Smoker    Packs/day: 1.30    Years: 12.00    Pack years: 15.60    Types: Cigarettes    Start date: 06/12/1984    Quit date: 03/18/1995    Years since quitting: 23.7  . Smokeless tobacco: Never Used  . Tobacco comment: Not interested in returning to smoking.    Substance and Sexual Activity  . Alcohol use: Yes    Comment: rarely  . Drug use: No  . Sexual activity: Yes    Partners: Male    Comment: married  Lifestyle  . Physical activity    Days per week: Not on file    Minutes per session: Not on file  . Stress: Not on file  Relationships  . Social Herbalist on phone: Not on file    Gets together: Not on file    Attends religious service: Not on file    Active member of club or organization: Not on file    Attends meetings of clubs or organizations: Not on file    Relationship status: Not on file  Other Topics Concern  . Not on file  Social History Narrative  . Not on file   Allergies  Allergen Reactions  . Chocolate Anaphylaxis  . Food     chocolate  . Statins     Intolerant  lft elevation  . Latex Rash   Family History  Problem Relation Age of Onset  . Diabetes Father   . Hypertension Father   . Stroke Father   .  Kidney disease Father   . Heart disease Father   . Cancer Mother 60       breast ca died at age 88  . Hypertension Other   . Diabetes type II Other   . Obesity Other   . Obesity Brother   . Hypertension Brother   . Heart disease Maternal Grandmother   . Alzheimer's disease Maternal Grandmother   . Diabetes Paternal Grandmother   . Heart disease Paternal Grandfather     Current Outpatient Medications (Endocrine & Metabolic):  .  SYNJARDY XR 12.10-998 MG TB24, 2 tablets daily.  Marland Kitchen  VICTOZA 18 MG/3ML SOPN, INJECT 1.8 MG INTO THE SKIN AS DIRECTED DAILY. (Patient taking differently: 1.2 mg. )    Current Outpatient Medications (Analgesics):  Marland Kitchen  Acetaminophen (TYLENOL PO), Take by mouth as needed. Marland Kitchen  aspirin EC 81 MG tablet, Take 81 mg by mouth daily.  Current Outpatient Medications (Hematological):  .  folic acid (FOLVITE) 1 MG tablet, Take 1 tablet (1 mg total) by mouth 2 (two) times daily.  Current Outpatient Medications (Other):  Marland Kitchen  ACCU-CHEK GUIDE test strip,  .  cholecalciferol  (VITAMIN D3) 25 MCG (1000 UT) tablet, Take 1,000 Units by mouth daily. .  clobetasol cream (TEMOVATE) 1.02 %, Apply 1 application topically 2 (two) times daily. As directed; never apply to face. .  Clobetasol Propionate Emulsion 0.05 % topical foam, Apply 1 application topically 2 (two) times daily. .  Diclofenac Sodium (PENNSAID) 2 % SOLN, Place 2 application onto the skin 2 (two) times daily. .  methotrexate 50 MG/2ML injection, Inject 0.6 mL under the skin weekly .  neomycin-polymyxin-hydrocortisone (CORTISPORIN) 3.5-10000-1 OTIC suspension, Place 3 drops into both ears 3 (three) times daily. .  Omega-3 Fatty Acids (FISH OIL) 1200 MG CAPS, Take 1-2 capsules (1,200-2,400 mg total) by mouth 2 (two) times daily. Takes 2400 mg qam and 1200 mg qpm .  Secukinumab, 300 MG Dose, (COSENTYX SENSOREADY, 300 MG,) 150 MG/ML SOAJ, Inject 150 mg into the skin every 14 (fourteen) days. .  TURMERIC PO, Take by mouth 2 (two) times daily.  Marland Kitchen  UNIFINE PENTIPS 32G X 4 MM MISC,     Past medical history, social, surgical and family history all reviewed in electronic medical record.  No pertanent information unless stated regarding to the chief complaint.   Review of Systems:  No headache, visual changes, nausea, vomiting, diarrhea, constipation, dizziness, abdominal pain, skin rash, fevers, chills, night sweats, weight loss, swollen lymph nodes, body aches, joint swelling,  chest pain, shortness of breath, mood changes.   Objective  Blood pressure 130/80, pulse 84, height 5\' 3"  (1.6 m), weight 229 lb (103.9 kg), SpO2 98 %.    General: No apparent distress alert and oriented x3 mood and affect normal, dressed appropriately.  HEENT: Pupils equal, extraocular movements intact  Respiratory: Patient's speak in full sentences and does not appear short of breath  Cardiovascular: No lower extremity edema, non tender, no erythema  Skin: Warm dry intact with no signs of infection or rash on extremities or on axial  skeleton.  Abdomen: Soft nontender  Neuro: Cranial nerves II through XII are intact, neurovascularly intact in all extremities with 2+ DTRs and 2+ pulses.  Lymph: No lymphadenopathy of posterior or anterior cervical chain or axillae bilaterally.     After informed written and verbal consent, patient was seated on exam table. Right knee was prepped with alcohol swab and utilizing anterolateral approach, patient's right knee space was injected with 2  cc of 0.5% Marcaine and then injected with 6 cc of pre-centrifuge PRP.Marland Kitchen Patient tolerated the procedure well without immediate complications.   Impression and Recommendations:     This case required medical decision making of moderate complexity. The above documentation has been reviewed and is accurate and complete Lyndal Pulley, DO       Note: This dictation was prepared with Dragon dictation along with smaller phrase technology. Any transcriptional errors that result from this process are unintentional.

## 2018-12-24 NOTE — Patient Instructions (Addendum)
PRP today in left knee Rheumatology at [facility] See me again in 3 weeks

## 2018-12-25 ENCOUNTER — Encounter: Payer: Self-pay | Admitting: Family Medicine

## 2018-12-26 ENCOUNTER — Ambulatory Visit (INDEPENDENT_AMBULATORY_CARE_PROVIDER_SITE_OTHER): Payer: No Typology Code available for payment source

## 2018-12-26 DIAGNOSIS — R002 Palpitations: Secondary | ICD-10-CM | POA: Diagnosis not present

## 2018-12-31 ENCOUNTER — Ambulatory Visit: Payer: No Typology Code available for payment source | Admitting: Family Medicine

## 2019-01-06 ENCOUNTER — Other Ambulatory Visit: Payer: Self-pay | Admitting: Internal Medicine

## 2019-01-06 DIAGNOSIS — R002 Palpitations: Secondary | ICD-10-CM

## 2019-01-15 ENCOUNTER — Other Ambulatory Visit: Payer: Self-pay

## 2019-01-15 ENCOUNTER — Ambulatory Visit (INDEPENDENT_AMBULATORY_CARE_PROVIDER_SITE_OTHER): Payer: No Typology Code available for payment source | Admitting: Family Medicine

## 2019-01-15 ENCOUNTER — Encounter: Payer: Self-pay | Admitting: Family Medicine

## 2019-01-15 VITALS — BP 112/74 | HR 74 | Ht 63.0 in | Wt 226.0 lb

## 2019-01-15 DIAGNOSIS — M999 Biomechanical lesion, unspecified: Secondary | ICD-10-CM | POA: Diagnosis not present

## 2019-01-15 DIAGNOSIS — M170 Bilateral primary osteoarthritis of knee: Secondary | ICD-10-CM | POA: Diagnosis not present

## 2019-01-15 MED FILL — COSENTYX 300 MG DOSE-2 PENS: 150 | 28 days supply | Qty: 2 | Fill #2

## 2019-01-15 MED FILL — VICTOZA 18 MG/3 ML INJECT P: 18 | 30 days supply | Qty: 9 | Fill #0

## 2019-01-15 NOTE — Assessment & Plan Note (Addendum)
Patient initially with me Doing well.  Patient unfortunately not having worsening symptoms again.  Discussed which activities of doing which wants to avoid.  Patient did not do well with the PRP initially future.  Patient does have the underlying autoimmune disease that does complicate the picture somewhat.  And still [place] type of steroid injection.  Follow-up again in 4-6 weeks

## 2019-01-15 NOTE — Progress Notes (Signed)
[name] [address] [place], [place] [address] Phone: [phone] Subjective:   Tabitha [name], am serving as a scribe for Dr. Hulan Saas.  I'm seeing this patient by the request  of:    CC:   [id number]   12/24/2018 PRP given.  Discussed icing regimen and home exercise.  Discussed which activities of doing which wants to avoid.  Discussed posture mechanics.  Follow-up in 4 to 3 weeks  Update 01/15/2019 Tabitha [name] is a 53 y.o. female coming in with complaint of right knee pain. Patient states that her knee pain improved, got worse, and is now a little better. At it's worst she had an increase in swelling and some popping. Symptoms have decreased for the most part. If seated for a while the first few steps are painful.  Low back seems to be a little worse but nothing severe.  Has been working out with her trainer on a regular basis.      Past Medical History:  Diagnosis Date  . Arthritis   . De Quervain's disease (tenosynovitis)   . Dermatophytosis, scalp   . Diabetes mellitus   . Hypercholesteremia   . Obesity   . Polycystic ovarian disease   . Psoriatic arthritis ([place])   . Wears glasses    Past Surgical History:  Procedure Laterality Date  . CARPAL TUNNEL RELEASE    . COLONOSCOPY    . DE QUERVAIN'S RELEASE  2012   left  . KNEE ARTHROPLASTY    . KNEE ARTHROSCOPY Right 05/06/2013   Procedure: RIGHT KNEE ARTHROSCOPY ;  Surgeon: Hessie Dibble, MD;  Location: [facility];  Service: Orthopedics;  Laterality: Right;  partial lateral minisectomy and chondroplasty  . WISDOM TOOTH EXTRACTION    . WRIST SURGERY  2001   carpel tunnel -rt   Social History   Socioeconomic History  . Marital status: Legally Separated    Spouse name: Not on file  . Number of children: Not on file  . Years of education: Not on file  . Highest education level: Not on file  Occupational History  . 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 Financial resource strain: Not on file  . Food insecurity    Worry: Not on file    Inability: Not on file  . Transportation needs    Medical: Not on file    Non-medical: Not on file  Tobacco Use  . Smoking status: Former Smoker    Packs/day: 1.30    Years: 12.00    Pack years: 15.60    Types: Cigarettes    Start date: 06/12/1984    Quit date: 03/18/1995    Years since quitting: 23.8  . Smokeless tobacco: Never Used  . Tobacco comment: Not interested in returning to smoking.   Substance and Sexual Activity  . Alcohol use: Yes    Comment: rarely  . Drug use: [name]  . Sexual activity: Yes    Partners: Male    Comment: married  Lifestyle  . Physical activity    Days per week: Not on file    Minutes per session: Not on file  . Stress: Not on file  Relationships  . Social Herbalist on phone: Not on file    Gets together: Not on file    Attends religious service: Not on file    Active member  of club or organization: Not on file    Attends meetings of clubs or organizations: Not on file    Relationship status: Not on file  Other Topics Concern  . Not on file  Social History Narrative  . Not on file   Allergies  Allergen Reactions  . Chocolate Anaphylaxis  . Food     chocolate  . Statins     Intolerant  lft elevation  . Latex Rash   Family History  Problem Relation Age of Onset  . Diabetes Father   . Hypertension Father   . Stroke Father   . Kidney disease Father   . Heart disease Father   . Cancer Mother 68       breast ca died at age 68  . Hypertension Other   . Diabetes type II Other   . Obesity Other   . Obesity Brother   . Hypertension Brother   . Heart disease Maternal Grandmother   . Alzheimer's disease Maternal Grandmother   . Diabetes Paternal Grandmother   . Heart disease Paternal Grandfather     Current Outpatient Medications (Endocrine & Metabolic):  .  SYNJARDY XR 12.10-998 MG  TB24, 2 tablets daily.  Marland Kitchen  VICTOZA 18 MG/3ML SOPN, INJECT 1.8 MG INTO THE SKIN AS DIRECTED DAILY. (Patient taking differently: 1.2 mg. )    Current Outpatient Medications (Analgesics):  Marland Kitchen  Acetaminophen (TYLENOL PO), Take by mouth as needed. Marland Kitchen  aspirin EC 81 MG tablet, Take 81 mg by mouth daily.  Current Outpatient Medications (Hematological):  .  folic acid (FOLVITE) 1 MG tablet, Take 1 tablet (1 mg total) by mouth 2 (two) times daily.  Current Outpatient Medications (Other):  Marland Kitchen  ACCU-CHEK GUIDE test strip,  .  cholecalciferol (VITAMIN D3) 25 MCG (1000 UT) tablet, Take 1,000 Units by mouth daily. .  clobetasol cream (TEMOVATE) 3.38 %, Apply 1 application topically 2 (two) times daily. As directed; never apply to face. .  Clobetasol Propionate Emulsion 0.05 % topical foam, Apply 1 application topically 2 (two) times daily. .  Diclofenac Sodium (PENNSAID) 2 % SOLN, Place 2 application onto the skin 2 (two) times daily. .  methotrexate 50 MG/2ML injection, Inject 0.6 mL under the skin weekly .  neomycin-polymyxin-hydrocortisone (CORTISPORIN) 3.5-10000-1 OTIC suspension, Place 3 drops into both ears 3 (three) times daily. .  Omega-3 Fatty Acids (FISH OIL) 1200 MG CAPS, Take 1-2 capsules (1,200-2,400 mg total) by mouth 2 (two) times daily. Takes 2400 mg qam and 1200 mg qpm .  Secukinumab, 300 MG Dose, (COSENTYX SENSOREADY, 300 MG,) 150 MG/ML SOAJ, Inject 150 mg into the skin every 14 (fourteen) days. .  TURMERIC PO, Take by mouth 2 (two) times daily.  Marland Kitchen  UNIFINE PENTIPS 32G X 4 MM MISC,     Past medical history, social, surgical and family history all reviewed in electronic medical record.  [name] pertanent information unless stated regarding to the chief complaint.   Review of Systems:  [name] headache, visual changes, nausea, vomiting, diarrhea, constipation, dizziness, abdominal pain, skin rash, fevers, chills, night sweats, weight loss, swollen lymph nodes, body aches, joint swelling, muscle  aches, chest pain, shortness of breath, mood changes.   Objective  Blood pressure 112/74, pulse 74, height 5\' 3"  (1.6 m), weight 226 lb (102.5 kg), SpO2 98 %.   General: [name] apparent distress alert and oriented x3 mood and affect normal, dressed appropriately.  HEENT: Pupils equal, extraocular movements intact  Respiratory: Patient's speak in full sentences and does  not appear short of breath  Cardiovascular: [name] lower extremity edema, non tender, [name] erythema  Skin: Warm dry intact with [name] signs of infection or rash on extremities or on axial skeleton.  Abdomen: Soft nontender  Neuro: Cranial nerves II through XII are intact, neurovascularly intact in all extremities with 2+ DTRs and 2+ pulses.  Lymph: [name] lymphadenopathy of posterior or anterior cervical chain or axillae bilaterally.  Gait normal with good balance and coordination.  MSK:  Non tender with full range of motion and good stability and symmetric strength and tone of shoulders, elbows, wrist, hip, and ankles bilaterally.  Patient is knee shows the patient does have an effusion bilaterally.  Lacks the last 5 degrees of flexion.  Patient does have crepitus noted.  Patient does have tender to palpation of medial joint line as well.  Mild instability with valgus force.  Back exam does have loss of lordosis.  Patient does have tightness with Corky Sox test but negative straight leg test.  Neurovascular intact distally with 5 out of 5 strength.  Osteopathic findings C4 flexed rotated and side bent left C6 flexed rotated and side bent left T3 extended rotated and side bent right inhaled third rib T9 extended rotated and side bent left L1 flexed rotated and side bent right Sacrum right on right    Impression and Recommendations:     This case required medical decision making of moderate complexity. The above documentation has been reviewed and is accurate and complete Lyndal Pulley, DO       Note: This dictation was prepared with  Dragon dictation along with smaller phrase technology. Any transcriptional errors that result from this process are unintentional.

## 2019-01-15 NOTE — Assessment & Plan Note (Signed)
Decision today to treat with OMT was based on Physical Exam  After verbal consent patient was treated with HVLA, ME, FPR techniques in cervical, thoracic, rib,  lumbar and sacral areas  Patient tolerated the procedure well with improvement in symptoms  Patient given exercises, stretches and lifestyle modifications  See medications in patient instructions if given  Patient will follow up in 4-8 weeks 

## 2019-01-15 NOTE — Patient Instructions (Signed)
See me again in 5-6 weeks Call if you would like to do PRP

## 2019-01-17 ENCOUNTER — Ambulatory Visit (INDEPENDENT_AMBULATORY_CARE_PROVIDER_SITE_OTHER): Payer: No Typology Code available for payment source | Admitting: Family Medicine

## 2019-01-17 ENCOUNTER — Other Ambulatory Visit: Payer: Self-pay

## 2019-01-17 ENCOUNTER — Encounter: Payer: Self-pay | Admitting: Family Medicine

## 2019-01-17 DIAGNOSIS — M170 Bilateral primary osteoarthritis of knee: Secondary | ICD-10-CM

## 2019-01-17 NOTE — Patient Instructions (Signed)
See me again in 4 weeks  

## 2019-01-17 NOTE — Progress Notes (Signed)
[name] [address], [place] [address] Phone: [phone] Subjective:   Tabitha [name], am serving as a scribe for Dr. Hulan Saas.  CC: Bilateral knee pain  [id number]  Tabitha [name] is a 53 y.o. female coming in with complaint of bilateral knee pain. Patient is here for PRP injection for left knee. Had injection previously on left knee which alleviated her pain initially. Patient has since had an increase in pain and intermittent swelling.     Past Medical History:  Diagnosis Date  . Arthritis   . De Quervain's disease (tenosynovitis)   . Dermatophytosis, scalp   . Diabetes mellitus   . Hypercholesteremia   . Obesity   . Polycystic ovarian disease   . Psoriatic arthritis ([place])   . Wears glasses    Past Surgical History:  Procedure Laterality Date  . CARPAL TUNNEL RELEASE    . COLONOSCOPY    . DE QUERVAIN'S RELEASE  2012   left  . KNEE ARTHROPLASTY    . KNEE ARTHROSCOPY Right 05/06/2013   Procedure: RIGHT KNEE ARTHROSCOPY ;  Surgeon: Hessie Dibble, MD;  Location: [facility];  Service: Orthopedics;  Laterality: Right;  partial lateral minisectomy and chondroplasty  . WISDOM TOOTH EXTRACTION    . WRIST SURGERY  2001   carpel tunnel -rt   Social History   Socioeconomic History  . Marital status: Legally Separated    Spouse name: Not on file  . Number of children: Not on file  . Years of education: Not on file  . Highest education level: Not on file  Occupational History  . 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 Financial resource strain: Not on file  . Food insecurity    Worry: Not on file    Inability: Not on file  . Transportation needs    Medical: Not on file    Non-medical: Not on file  Tobacco Use  . Smoking status: Former Smoker    Packs/day: 1.30    Years: 12.00    Pack years: 15.60    Types: Cigarettes    Start date: 06/12/1984   Quit date: 03/18/1995    Years since quitting: 23.8  . Smokeless tobacco: Never Used  . Tobacco comment: Not interested in returning to smoking.   Substance and Sexual Activity  . Alcohol use: Yes    Comment: rarely  . Drug use: [name]  . Sexual activity: Yes    Partners: Male    Comment: married  Lifestyle  . Physical activity    Days per week: Not on file    Minutes per session: Not on file  . Stress: Not on file  Relationships  . Social Herbalist on phone: Not on file    Gets together: Not on file    Attends religious service: Not on file    Active member of club or organization: Not on file    Attends meetings of clubs or organizations: Not on file    Relationship status: Not on file  Other Topics Concern  . Not on file  Social History Narrative  . Not on file   Allergies  Allergen Reactions  . Chocolate Anaphylaxis  . Food     chocolate  . Statins     Intolerant  lft elevation  . Latex Rash   Family History  Problem Relation Age  of Onset  . Diabetes Father   . Hypertension Father   . Stroke Father   . Kidney disease Father   . Heart disease Father   . Cancer Mother 50       breast ca died at age 56  . Hypertension Other   . Diabetes type II Other   . Obesity Other   . Obesity Brother   . Hypertension Brother   . Heart disease Maternal Grandmother   . Alzheimer's disease Maternal Grandmother   . Diabetes Paternal Grandmother   . Heart disease Paternal Grandfather     Current Outpatient Medications (Endocrine & Metabolic):  .  SYNJARDY XR 12.10-998 MG TB24, 2 tablets daily.  Marland Kitchen  VICTOZA 18 MG/3ML SOPN, INJECT 1.8 MG INTO THE SKIN AS DIRECTED DAILY. (Patient taking differently: 1.2 mg. )    Current Outpatient Medications (Analgesics):  Marland Kitchen  Acetaminophen (TYLENOL PO), Take by mouth as needed. Marland Kitchen  aspirin EC 81 MG tablet, Take 81 mg by mouth daily.  Current Outpatient Medications (Hematological):  .  folic acid (FOLVITE) 1 MG tablet, Take 1  tablet (1 mg total) by mouth 2 (two) times daily.  Current Outpatient Medications (Other):  Marland Kitchen  ACCU-CHEK GUIDE test strip,  .  cholecalciferol (VITAMIN D3) 25 MCG (1000 UT) tablet, Take 1,000 Units by mouth daily. .  clobetasol cream (TEMOVATE) 4.09 %, Apply 1 application topically 2 (two) times daily. As directed; never apply to face. .  Clobetasol Propionate Emulsion 0.05 % topical foam, Apply 1 application topically 2 (two) times daily. .  Diclofenac Sodium (PENNSAID) 2 % SOLN, Place 2 application onto the skin 2 (two) times daily. .  methotrexate 50 MG/2ML injection, Inject 0.6 mL under the skin weekly .  neomycin-polymyxin-hydrocortisone (CORTISPORIN) 3.5-10000-1 OTIC suspension, Place 3 drops into both ears 3 (three) times daily. .  Omega-3 Fatty Acids (FISH OIL) 1200 MG CAPS, Take 1-2 capsules (1,200-2,400 mg total) by mouth 2 (two) times daily. Takes 2400 mg qam and 1200 mg qpm .  Secukinumab, 300 MG Dose, (COSENTYX SENSOREADY, 300 MG,) 150 MG/ML SOAJ, Inject 150 mg into the skin every 14 (fourteen) days. .  TURMERIC PO, Take by mouth 2 (two) times daily.  Marland Kitchen  UNIFINE PENTIPS 32G X 4 MM MISC,     Past medical history, social, surgical and family history all reviewed in electronic medical record.  [name] pertanent information unless stated regarding to the chief complaint.   Review of Systems:  [name] headache, visual changes, nausea, vomiting, diarrhea, constipation, dizziness, abdominal pain, skin rash, fevers, chills, night sweats, weight loss, swollen lymph nodes, body aches, joint swelling, chest pain, shortness of breath, mood changes.  Positive muscle aches  Objective  Blood pressure 108/74, pulse 83, height 5\' 3"  (1.6 m), SpO2 98 %.   General: [name] apparent distress alert and oriented x3 mood and affect normal, dressed appropriately.    Procedure: Real-time Ultrasound Guided Injection of left knee Device: GE Logiq Q7 Ultrasound guided injection is preferred based studies that show  increased duration, increased effect, greater accuracy, decreased procedural pain, increased response rate, and decreased cost with ultrasound guided versus blind injection.  Verbal informed consent obtained.  Time-out conducted.  Noted [name] overlying erythema, induration, or other signs of local infection.  Skin prepped in a sterile fashion.  Local anesthesia: Topical Ethyl chloride.  With sterile technique and under real time ultrasound guidance: With a 22-gauge 2 inch needle patient was injected with 3 cc of pre-centrifuge fluid of PRP this  was from a superior lateral approach.  Completed without difficulty  Pain immediately resolved suggesting accurate placement of the medication.  Advised to call if fevers/chills, erythema, induration, drainage, or persistent bleeding.  Images permanently stored and available for review in the ultrasound unit.  Impression: Technically successful ultrasound guided injection.   After informed written and verbal consent, patient was seated on exam table. Right knee was prepped with alcohol swab and utilizing anterolateral approach, patient's right knee space was injected with 3 cc of pre-centrifuge PRP. Patient tolerated the procedure well without immediate complications.   Impression and Recommendations:     This case required medical decision making of moderate complexity. The above documentation has been reviewed and is accurate and complete Lyndal Pulley, DO       Note: This dictation was prepared with Dragon dictation along with smaller phrase technology. Any transcriptional errors that result from this process are unintentional.

## 2019-01-17 NOTE — Assessment & Plan Note (Signed)
PRP done again today.  Hopefully this will do well.  Last longer.  Patient was doing significantly better initially with the injections but then seemed to get worse again.  Hopefully will notice significant improvement for longer duration.  Follow-up again in 4 weeks

## 2019-01-29 MED FILL — SYNJARDY XR 12.5-1,000 MG T: 12.5-1000 | 90 days supply | Qty: 180 | Fill #2

## 2019-02-03 MED FILL — FREESTYLE LITE TEST STRIP: 90 days supply | Qty: 300 | Fill #0

## 2019-02-10 ENCOUNTER — Other Ambulatory Visit: Payer: Self-pay

## 2019-02-10 ENCOUNTER — Other Ambulatory Visit: Payer: Self-pay | Admitting: Internal Medicine

## 2019-02-10 DIAGNOSIS — Z79899 Other long term (current) drug therapy: Secondary | ICD-10-CM

## 2019-02-11 ENCOUNTER — Other Ambulatory Visit: Payer: Self-pay | Admitting: Rheumatology

## 2019-02-11 DIAGNOSIS — L4050 Arthropathic psoriasis, unspecified: Secondary | ICD-10-CM

## 2019-02-11 MED FILL — METHOTREXATE 25 MG/ML VIAL: 50 | 23 days supply | Qty: 2 | Fill #1

## 2019-02-11 NOTE — Telephone Encounter (Signed)
Last Visit: 12/19/18  Next Visit: 05/22/19 Labs: 02/10/19 Glucose is elevated-231. Please notify patient. Rest of CMP WNL. CBC WNL. TB Gold: 01/11/18 Neg (updated 02/10/19 pending results)  Okay to refill per Dr. Estanislado Pandy

## 2019-02-12 LAB — QUANTIFERON-TB GOLD PLUS
Mitogen-NIL: >10 IU/mL
NIL: 0.04 IU/mL
QuantiFERON-TB Gold Plus: NEGATIVE
TB1-NIL: <0 IU/mL
TB2-NIL: 0 IU/mL

## 2019-02-12 LAB — CBC WITH DIFFERENTIAL/PLATELET
Absolute Monocytes: 593 cells/uL (ref 200–950)
Basophils Absolute: 40 cells/uL (ref 0–200)
Basophils Relative: 0.5 %
Eosinophils Absolute: 158 cells/uL (ref 15–500)
Eosinophils Relative: 2 %
HCT: 40.9 % (ref 35.0–45.0)
Hemoglobin: 13.6 g/dL (ref 11.7–15.5)
Lymphs Abs: 1501 cells/uL (ref 850–3900)
MCH: 30.7 pg (ref 27.0–33.0)
MCHC: 33.3 g/dL (ref 32.0–36.0)
MCV: 92.3 fL (ref 80.0–100.0)
MPV: 11.7 fL (ref 7.5–12.5)
Monocytes Relative: 7.5 %
Neutro Abs: 5609 cells/uL (ref 1500–7800)
Neutrophils Relative %: 71 %
Platelets: 223 10*3/uL (ref 140–400)
RBC: 4.43 10*6/uL (ref 3.80–5.10)
RDW: 12.6 % (ref 11.0–15.0)
Total Lymphocyte: 19 %
WBC: 7.9 10*3/uL (ref 3.8–10.8)

## 2019-02-12 LAB — COMPLETE METABOLIC PANEL WITH GFR
AG Ratio: 1.5 (calc) (ref 1.0–2.5)
ALT: 19 U/L (ref 6–29)
AST: 19 U/L (ref 10–35)
Albumin: 4 g/dL (ref 3.6–5.1)
Alkaline phosphatase (APISO): 71 U/L (ref 37–153)
BUN: 19 mg/dL (ref 7–25)
CO2: 26 mmol/L (ref 20–32)
Calcium: 9.4 mg/dL (ref 8.6–10.4)
Chloride: 103 mmol/L (ref 98–110)
Creat: 0.75 mg/dL (ref 0.50–1.05)
GFR, Est African American: 105 mL/min/{1.73_m2} (ref >=60)
GFR, Est Non African American: 90 mL/min/{1.73_m2} (ref >=60)
Globulin: 2.7 g/dL (calc) (ref 1.9–3.7)
Glucose, Bld: 231 mg/dL — ABNORMAL HIGH (ref 65–99)
Potassium: 4.2 mmol/L (ref 3.5–5.3)
Sodium: 139 mmol/L (ref 135–146)
Total Bilirubin: 0.5 mg/dL (ref 0.2–1.2)
Total Protein: 6.7 g/dL (ref 6.1–8.1)

## 2019-02-12 NOTE — Progress Notes (Signed)
[name] [address] [place], [place] [address] Phone: [phone] Subjective:   I, Tabitha [name], am serving as a scribe for Dr. Hulan Saas.  I'm seeing this patient by the request  of:    CC: Back pain, knee pain follow-up  [id number]  Tabitha [name] is a 53 y.o. female coming in with complaint of back pain. Last seen on 01/17/2019 for PRP injection for her knee. Patient is here for OMT to manage her back pain. Patient states she is feeling much better both knees and back.       Past Medical History:  Diagnosis Date  . Arthritis   . De Quervain's disease (tenosynovitis)   . Dermatophytosis, scalp   . Diabetes mellitus   . Hypercholesteremia   . Obesity   . Polycystic ovarian disease   . Psoriatic arthritis ([place])   . Wears glasses    Past Surgical History:  Procedure Laterality Date  . CARPAL TUNNEL RELEASE    . COLONOSCOPY    . DE QUERVAIN'S RELEASE  2012   left  . KNEE ARTHROPLASTY    . KNEE ARTHROSCOPY Right 05/06/2013   Procedure: RIGHT KNEE ARTHROSCOPY ;  Surgeon: Hessie Dibble, MD;  Location: [facility];  Service: Orthopedics;  Laterality: Right;  partial lateral minisectomy and chondroplasty  . WISDOM TOOTH EXTRACTION    . WRIST SURGERY  2001   carpel tunnel -rt   Social History   Socioeconomic History  . Marital status: Legally Separated    Spouse name: Not on file  . Number of children: Not on file  . Years of education: Not on file  . Highest education level: Not on file  Occupational History  . 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 Financial resource strain: Not on file  . Food insecurity    Worry: Not on file    Inability: Not on file  . Transportation needs    Medical: Not on file    Non-medical: Not on file  Tobacco Use  . Smoking status: Former Smoker    Packs/day: 1.30    Years: 12.00    Pack years: 15.60    Types: Cigarettes    Start date: 06/12/1984    Quit date: 03/18/1995    Years since quitting: 23.9  . Smokeless tobacco: Never Used  . Tobacco comment: Not interested in returning to smoking.   Substance and Sexual Activity  . Alcohol use: Yes    Comment: rarely  . Drug use: No  . Sexual activity: Yes    Partners: Male    Comment: married  Lifestyle  . Physical activity    Days per week: Not on file    Minutes per session: Not on file  . Stress: Not on file  Relationships  . Social Herbalist on phone: Not on file    Gets together: Not on file    Attends religious service: Not on file    Active member of club or organization: Not on file    Attends meetings of clubs or organizations: Not on file    Relationship status: Not on file  Other Topics Concern  . Not on file  Social History Narrative  . Not on file   Allergies  Allergen Reactions  . Chocolate Anaphylaxis  . Food     chocolate  . Statins  Intolerant  lft elevation  . Latex Rash   Family History  Problem Relation Age of Onset  . Diabetes Father   . Hypertension Father   . Stroke Father   . Kidney disease Father   . Heart disease Father   . Cancer Mother 76       breast ca died at age 54  . Hypertension Other   . Diabetes type II Other   . Obesity Other   . Obesity Brother   . Hypertension Brother   . Heart disease Maternal Grandmother   . Alzheimer's disease Maternal Grandmother   . Diabetes Paternal Grandmother   . Heart disease Paternal Grandfather     Current Outpatient Medications (Endocrine & Metabolic):  .  SYNJARDY XR 12.10-998 MG TB24, 2 tablets daily.  Marland Kitchen  VICTOZA 18 MG/3ML SOPN, INJECT 1.8 MG INTO THE SKIN AS DIRECTED DAILY. (Patient taking differently: 1.2 mg. )    Current Outpatient Medications (Analgesics):  Marland Kitchen  Acetaminophen (TYLENOL PO), Take by mouth as needed. Marland Kitchen  aspirin EC 81 MG tablet, Take 81 mg by mouth daily.  Current Outpatient Medications (Hematological):  .  folic acid  (FOLVITE) 1 MG tablet, Take 1 tablet (1 mg total) by mouth 2 (two) times daily.  Current Outpatient Medications (Other):  Marland Kitchen  ACCU-CHEK GUIDE test strip,  .  cholecalciferol (VITAMIN D3) 25 MCG (1000 UT) tablet, Take 1,000 Units by mouth daily. .  clobetasol cream (TEMOVATE) AB-123456789 %, Apply 1 application topically 2 (two) times daily. As directed; never apply to face. .  Clobetasol Propionate Emulsion 0.05 % topical foam, Apply 1 application topically 2 (two) times daily. .  Diclofenac Sodium (PENNSAID) 2 % SOLN, Place 2 application onto the skin 2 (two) times daily. .  methotrexate 50 MG/2ML injection, Inject 0.6 mL under the skin weekly .  neomycin-polymyxin-hydrocortisone (CORTISPORIN) 3.5-10000-1 OTIC suspension, Place 3 drops into both ears 3 (three) times daily. .  Omega-3 Fatty Acids (FISH OIL) 1200 MG CAPS, Take 1-2 capsules (1,200-2,400 mg total) by mouth 2 (two) times daily. Takes 2400 mg qam and 1200 mg qpm .  Secukinumab, 300 MG Dose, (COSENTYX SENSOREADY, 300 MG,) 150 MG/ML SOAJ, Inject 150 mg into the skin every 14 (fourteen) days. .  TURMERIC PO, Take by mouth 2 (two) times daily.  Marland Kitchen  UNIFINE PENTIPS 32G X 4 MM MISC,     Past medical history, social, surgical and family history all reviewed in electronic medical record.  No pertanent information unless stated regarding to the chief complaint.   Review of Systems:  No headache, visual changes, nausea, vomiting, diarrhea, constipation, dizziness, abdominal pain, skin rash, fevers, chills, night sweats, weight loss, swollen lymph nodes, body aches, joint swelling, muscle aches, chest pain, shortness of breath, mood changes.   Objective  Blood pressure (!) 142/80, pulse 82, height 5\' 3"  (1.6 m), weight 231 lb (104.8 kg), SpO2 98 %.    General: No apparent distress alert and oriented x3 mood and affect normal, dressed appropriately.  HEENT: Pupils equal, extraocular movements intact  Respiratory: Patient's speak in full sentences  and does not appear short of breath  Cardiovascular: No lower extremity edema, non tender, no erythema  Skin: Warm dry intact with no signs of infection or rash on extremities or on axial skeleton.  Abdomen: Soft nontender  Neuro: Cranial nerves II through XII are intact, neurovascularly intact in all extremities with 2+ DTRs and 2+ pulses.  Lymph: No lymphadenopathy of posterior or anterior cervical chain or  axillae bilaterally.  Gait normal with good balance and coordination.  MSK:  Non tender with full range of motion and good stability and symmetric strength and tone of shoulders, elbows, wrist, hip and ankles bilaterally.  Knee: Bilateral valgus deformity noted.  Abnormal thigh to calf ratio.  Minimal tenderness to palpation ROM full in flexion and extension and lower leg rotation..  painful patellar compression. Patellar glide with moderate crepitus. Patellar and quadriceps tendons unremarkable. Hamstring and quadriceps strength is normal.  Back Exam:  Inspection: Poor core strength Motion: Flexion 45 deg, Extension 35 deg, Side Bending to 45 deg bilaterally,  Rotation to 45 deg bilaterally  SLR laying: Negative  XSLR laying: Negative  Palpable tenderness: Tender to palpation sacroiliac joints bilaterally. FABER: negative. Sensory change: Gross sensation intact to all lumbar and sacral dermatomes.  Reflexes: 2+ at both patellar tendons, 2+ at achilles tendons, Babinski's downgoing.  Strength at foot  Plantar-flexion: 5/5 Dorsi-flexion: 5/5 Eversion: 5/5 Inversion: 5/5  Leg strength  Quad: 5/5 Hamstring: 5/5 Hip flexor: 5/5 Hip abductors: 5/5  .  Osteopathic findings C3 flexed rotated and side bent right T3 extended rotated and side bent right inhaled third rib T6 extended rotated and side bent left L2 flexed rotated and side bent right Sacrum right on right     Impression and Recommendations:     This case required medical decision making of moderate complexity.  The above documentation has been reviewed and is accurate and complete Lyndal Pulley, DO       Note: This dictation was prepared with Dragon dictation along with smaller phrase technology. Any transcriptional errors that result from this process are unintentional.

## 2019-02-13 ENCOUNTER — Ambulatory Visit: Payer: No Typology Code available for payment source | Admitting: Family Medicine

## 2019-02-13 ENCOUNTER — Encounter: Payer: Self-pay | Admitting: Family Medicine

## 2019-02-13 ENCOUNTER — Other Ambulatory Visit: Payer: Self-pay

## 2019-02-13 ENCOUNTER — Other Ambulatory Visit: Payer: Self-pay | Admitting: Pharmacist

## 2019-02-13 VITALS — BP 142/80 | HR 82 | Ht 63.0 in | Wt 231.0 lb

## 2019-02-13 DIAGNOSIS — M999 Biomechanical lesion, unspecified: Secondary | ICD-10-CM

## 2019-02-13 DIAGNOSIS — L4050 Arthropathic psoriasis, unspecified: Secondary | ICD-10-CM | POA: Diagnosis not present

## 2019-02-13 MED ORDER — COSENTYX SENSOREADY (300 MG) 150 MG/ML ~~LOC~~ SOAJ: 150.0000 mg | SUBCUTANEOUS | 0 refills | Status: DC

## 2019-02-13 MED FILL — COSENTYX 150 MG/ML PEN INJE: 150 | 28 days supply | Qty: 2 | Fill #0

## 2019-02-13 NOTE — Progress Notes (Signed)
TB gold negative

## 2019-02-13 NOTE — Assessment & Plan Note (Signed)
Underlying psoriatic arthritis is likely contributing to some of the aches and pains.  We discussed with patient at great length, discussed home exercises, which activities to doing which wants to avoid.  Patient is to increase activity as tolerated.  Follow-up again in 4 to 8 weeks

## 2019-02-13 NOTE — Assessment & Plan Note (Signed)
Decision today to treat with OMT was based on Physical Exam  After verbal consent patient was treated with HVLA, ME, FPR techniques in cervical, thoracic, rib lumbar and sacral areas  Patient tolerated the procedure well with improvement in symptoms  Patient given exercises, stretches and lifestyle modifications  See medications in patient instructions if given  Patient will follow up in 6-8 weeks 

## 2019-02-13 NOTE — Patient Instructions (Signed)
Keep it up doing great See me again in 5 weeks

## 2019-03-01 MED FILL — METHOTREXATE 25 MG/ML VIAL: 50 | 46 days supply | Qty: 4 | Fill #0

## 2019-03-11 ENCOUNTER — Other Ambulatory Visit: Payer: Self-pay | Admitting: Rheumatology

## 2019-03-11 DIAGNOSIS — L4050 Arthropathic psoriasis, unspecified: Secondary | ICD-10-CM

## 2019-03-11 NOTE — Telephone Encounter (Signed)
Last Visit: 12/19/18  Next Visit: 05/22/19  Okay to refill per Dr . Estanislado Pandy

## 2019-03-12 ENCOUNTER — Encounter: Payer: Self-pay | Admitting: Family Medicine

## 2019-03-12 MED FILL — VICTOZA 18 MG/3 ML INJECT P: 18 | 30 days supply | Qty: 9 | Fill #1

## 2019-03-12 MED FILL — COSENTYX 300 MG DOSE-2 PENS: 150 | 28 days supply | Qty: 2 | Fill #0

## 2019-03-17 NOTE — Progress Notes (Signed)
[name] [place], [place] [address] Phone: [phone] Subjective:     CC: Back pain and knee pain follow-up  [id number]    02/13/19: Underlying psoriatic arthritis is likely contributing to some of the aches and pains.  We discussed with patient at great length, discussed home exercises, which activities to doing which wants to avoid.  Patient is to increase activity as tolerated.  Follow-up again in 4 to 8 weeks  Update: 03/18/19 Tabitha [name] is a 53 y.o. female coming in with complaint of back and knee pain. Patient states that her back pain and knee pain have increased in the past couple of days. Is having numbness on left side of back into the glute. Over the weekend her pain increased despite having a massage as well.   Did walk up some inclines over the weekend and is now having pain over medial aspect. Also notes an increase in crepitus.   Also notes that her left clavicle is sitting up higher than usual. Feels instability of left shoulder. Massage therapist alerted patient to muscle that was inflammed near clavicle. Has been that way for 2 weeks now.    Past Medical History:  Diagnosis Date  . Arthritis   . De Quervain's disease (tenosynovitis)   . Dermatophytosis, scalp   . Diabetes mellitus   . Hypercholesteremia   . Obesity   . Polycystic ovarian disease   . Psoriatic arthritis ([place])   . Wears glasses    Past Surgical History:  Procedure Laterality Date  . CARPAL TUNNEL RELEASE    . COLONOSCOPY    . DE QUERVAIN'S RELEASE  2012   left  . KNEE ARTHROPLASTY    . KNEE ARTHROSCOPY Right 05/06/2013   Procedure: RIGHT KNEE ARTHROSCOPY ;  Surgeon: Hessie Dibble, MD;  Location: [facility];  Service: Orthopedics;  Laterality: Right;  partial lateral minisectomy and chondroplasty  . WISDOM TOOTH EXTRACTION    . WRIST SURGERY  2001   carpel tunnel -rt   Social History   Socioeconomic History  . Marital  status: Legally Separated    Spouse name: Not on file  . Number of children: Not on file  . Years of education: Not on file  . Highest education level: Not on file  Occupational History  . 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 Financial resource strain: Not on file  . Food insecurity    Worry: Not on file    Inability: Not on file  . Transportation needs    Medical: Not on file    Non-medical: Not on file  Tobacco Use  . Smoking status: Former Smoker    Packs/day: 1.30    Years: 12.00    Pack years: 15.60    Types: Cigarettes    Start date: 06/12/1984    Quit date: 03/18/1995    Years since quitting: 24.0  . Smokeless tobacco: Never Used  . Tobacco comment: Not interested in returning to smoking.   Substance and Sexual Activity  . Alcohol use: Yes    Comment: rarely  . Drug use: No  . Sexual activity: Yes    Partners: Male    Comment: married  Lifestyle  . Physical activity    Days per week: Not on file    Minutes per session: Not on file  . Stress: Not on file  Relationships  .  Social Herbalist on phone: Not on file    Gets together: Not on file    Attends religious service: Not on file    Active member of club or organization: Not on file    Attends meetings of clubs or organizations: Not on file    Relationship status: Not on file  Other Topics Concern  . Not on file  Social History Narrative  . Not on file   Allergies  Allergen Reactions  . Chocolate Anaphylaxis  . Food     chocolate  . Statins     Intolerant  lft elevation  . Latex Rash   Family History  Problem Relation Age of Onset  . Diabetes Father   . Hypertension Father   . Stroke Father   . Kidney disease Father   . Heart disease Father   . Cancer Mother 29       breast ca died at age 83  . Hypertension Other   . Diabetes type II Other   . Obesity Other   . Obesity Brother   . Hypertension Brother   . Heart disease  Maternal Grandmother   . Alzheimer's disease Maternal Grandmother   . Diabetes Paternal Grandmother   . Heart disease Paternal Grandfather     Current Outpatient Medications (Endocrine & Metabolic):  .  SYNJARDY XR 12.10-998 MG TB24, 2 tablets daily.  Marland Kitchen  VICTOZA 18 MG/3ML SOPN, INJECT 1.8 MG INTO THE SKIN AS DIRECTED DAILY. (Patient taking differently: 1.2 mg. )    Current Outpatient Medications (Analgesics):  Marland Kitchen  Acetaminophen (TYLENOL PO), Take by mouth as needed. Marland Kitchen  aspirin EC 81 MG tablet, Take 81 mg by mouth daily.  Current Outpatient Medications (Hematological):  .  folic acid (FOLVITE) 1 MG tablet, TAKE 1 TABLET BY MOUTH TWO TIMES DAILY  Current Outpatient Medications (Other):  Marland Kitchen  ACCU-CHEK GUIDE test strip,  .  cholecalciferol (VITAMIN D3) 25 MCG (1000 UT) tablet, Take 1,000 Units by mouth daily. .  clobetasol cream (TEMOVATE) AB-123456789 %, Apply 1 application topically 2 (two) times daily. As directed; never apply to face. .  Clobetasol Propionate Emulsion 0.05 % topical foam, Apply 1 application topically 2 (two) times daily. .  Diclofenac Sodium (PENNSAID) 2 % SOLN, Place 2 application onto the skin 2 (two) times daily. .  methotrexate 50 MG/2ML injection, Inject 0.6 mL under the skin weekly .  neomycin-polymyxin-hydrocortisone (CORTISPORIN) 3.5-10000-1 OTIC suspension, Place 3 drops into both ears 3 (three) times daily. .  Omega-3 Fatty Acids (FISH OIL) 1200 MG CAPS, Take 1-2 capsules (1,200-2,400 mg total) by mouth 2 (two) times daily. Takes 2400 mg qam and 1200 mg qpm .  Secukinumab, 300 MG Dose, (COSENTYX SENSOREADY, 300 MG,) 150 MG/ML SOAJ, Inject 150 mg into the skin every 14 (fourteen) days. .  TURMERIC PO, Take by mouth 2 (two) times daily.  Marland Kitchen  UNIFINE PENTIPS 32G X 4 MM MISC,     Past medical history, social, surgical and family history all reviewed in electronic medical record.  No pertanent information unless stated regarding to the chief complaint.   Review of  Systems:  No headache, visual changes, nausea, vomiting, diarrhea, constipation, dizziness, abdominal pain, skin rash, fevers, chills, night sweats, weight loss, swollen lymph nodes, body aches, joint swelling, muscle aches, chest pain, shortness of breath, mood changes.   Objective  Blood pressure 110/70, pulse 85, height 5\' 3"  (1.6 m), weight 235 lb (106.6 kg), SpO2 97 %.   General:  No apparent distress alert and oriented x3 mood and affect normal, dressed appropriately.  HEENT: Pupils equal, extraocular movements intact  Respiratory: Patient's speak in full sentences and does not appear short of breath  Cardiovascular: No lower extremity edema, non tender, no erythema  Skin: Warm dry intact with no signs of infection or rash on extremities or on axial skeleton.  Abdomen: Soft nontender  Neuro: Cranial nerves II through XII are intact, neurovascularly intact in all extremities with 2+ DTRs and 2+ pulses.  Lymph: No lymphadenopathy of posterior or anterior cervical chain or axillae bilaterally.  Gait normal with good balance and coordination.  MSK:  tender with full range of motion and good stability and symmetric strength and tone of shoulders, elbows, wrist, hip and ankles bilaterally.  Chest exam on the left side shows the patient does have a mild mass noted on the left side.  Nontender.  Seems to be freely movable.  This is under the clavicle potential breast tissue. Knee: Left Patient does have an effusion noted.  Patient does have near full range of motion.  Positive patellar grind.  Tender to palpation over the medial joint space as well.  Back Exam:  Inspection: Unremarkable  Motion: Flexion 45 deg, Extension 25 deg, Side Bending to 35 deg bilaterally,  Rotation to 35 deg bilaterally  SLR laying: Negative  XSLR laying: Negative  Palpable tenderness: Tender to palpation in the paraspinal musculature lumbar spine. FABER: negative. Sensory change: Gross sensation intact to all  lumbar and sacral dermatomes.  Reflexes: 2+ at both patellar tendons, 2+ at achilles tendons, Babinski's downgoing.  Strength at foot  Plantar-flexion: 5/5 Dorsi-flexion: 5/5 Eversion: 5/5 Inversion: 5/5  Leg strength  Quad: 5/5 Hamstring: 5/5 Hip flexor: 5/5 Hip abductors: 5/5  Gait unremarkable.  Limited musculoskeletal ultrasound was performed and interpreted by Lyndal Pulley  Limited ultrasound in the area shows some mild atypical vascularity in the area.  No true mass appreciated but potentially hypertrophy of the breast tissue noted.  Osteopathic findings C2 flexed rotated and side bent right C6 flexed rotated and side bent left T3 extended rotated and side bent right inhaled third rib T6 extended rotated and side bent left L2 flexed rotated and side bent right Sacrum right on right    Impression and Recommendations:     This case required medical decision making of moderate complexity. The above documentation has been reviewed and is accurate and complete Lyndal Pulley, DO       Note: This dictation was prepared with Dragon dictation along with smaller phrase technology. Any transcriptional errors that result from this process are unintentional.

## 2019-03-18 ENCOUNTER — Encounter: Payer: Self-pay | Admitting: Family Medicine

## 2019-03-18 ENCOUNTER — Other Ambulatory Visit: Payer: Self-pay

## 2019-03-18 ENCOUNTER — Ambulatory Visit: Payer: No Typology Code available for payment source | Admitting: Family Medicine

## 2019-03-18 VITALS — BP 110/70 | HR 85 | Ht 63.0 in | Wt 235.0 lb

## 2019-03-18 DIAGNOSIS — N6320 Unspecified lump in the left breast, unspecified quadrant: Secondary | ICD-10-CM

## 2019-03-18 DIAGNOSIS — G8929 Other chronic pain: Secondary | ICD-10-CM

## 2019-03-18 DIAGNOSIS — M25512 Pain in left shoulder: Secondary | ICD-10-CM

## 2019-03-18 DIAGNOSIS — M999 Biomechanical lesion, unspecified: Secondary | ICD-10-CM

## 2019-03-18 DIAGNOSIS — R222 Localized swelling, mass and lump, trunk: Secondary | ICD-10-CM | POA: Insufficient documentation

## 2019-03-18 NOTE — Assessment & Plan Note (Signed)
Decision today to treat with OMT was based on Physical Exam  After verbal consent patient was treated with HVLA, ME, FPR techniques in cervical, thoracic, rib lumbar and sacral areas  Patient tolerated the procedure well with improvement in symptoms  Patient given exercises, stretches and lifestyle modifications  See medications in patient instructions if given  Patient will follow up in 4-8 weeks 

## 2019-03-18 NOTE — Patient Instructions (Addendum)
[facility] [phone] See me again in 5 weeks

## 2019-03-18 NOTE — Assessment & Plan Note (Signed)
Patient does have a left chest mass, minimally to nontender.  Near the inner upper quadrants of the breast tissue some concern for this as well.  We will get an ultrasound to further evaluate.  May need mammogram.  Would not be due for another 3 months usually.

## 2019-03-19 ENCOUNTER — Other Ambulatory Visit: Payer: Self-pay | Admitting: Family Medicine

## 2019-03-19 DIAGNOSIS — N6320 Unspecified lump in the left breast, unspecified quadrant: Secondary | ICD-10-CM

## 2019-03-21 ENCOUNTER — Other Ambulatory Visit: Payer: Self-pay

## 2019-03-21 ENCOUNTER — Ambulatory Visit
Admission: RE | Admit: 2019-03-21 | Discharge: 2019-03-21 | Disposition: A | Discharge status: Home / Self Care | Payer: No Typology Code available for payment source | Source: Ambulatory Visit | Attending: Family Medicine | Admitting: Family Medicine

## 2019-03-21 ENCOUNTER — Encounter: Payer: Self-pay | Admitting: Family Medicine

## 2019-03-21 DIAGNOSIS — N6320 Unspecified lump in the left breast, unspecified quadrant: Secondary | ICD-10-CM

## 2019-03-25 ENCOUNTER — Encounter: Payer: Self-pay | Admitting: Family Medicine

## 2019-03-26 ENCOUNTER — Ambulatory Visit: Payer: No Typology Code available for payment source | Admitting: Family Medicine

## 2019-03-26 ENCOUNTER — Other Ambulatory Visit: Payer: Self-pay

## 2019-03-26 ENCOUNTER — Encounter: Payer: Self-pay | Admitting: Family Medicine

## 2019-03-26 ENCOUNTER — Ambulatory Visit: Payer: Self-pay

## 2019-03-26 VITALS — BP 106/66 | HR 82 | Ht 63.0 in | Wt 235.0 lb

## 2019-03-26 DIAGNOSIS — G8929 Other chronic pain: Secondary | ICD-10-CM

## 2019-03-26 DIAGNOSIS — M170 Bilateral primary osteoarthritis of knee: Secondary | ICD-10-CM

## 2019-03-26 DIAGNOSIS — M25562 Pain in left knee: Secondary | ICD-10-CM

## 2019-03-26 NOTE — Progress Notes (Signed)
[name] [address], [place] [address] Phone: [phone] Subjective:     CC: Left knee pain  [id number]   03/18/2019 Patient does have a left chest mass, minimally to nontender.  Near the inner upper quadrants of the breast tissue some concern for this as well.  We will get an ultrasound to further evaluate.  May need mammogram.  Would not be due for another 3 months usually.  Update 03/26/2019 Tabitha [name] is a 53 y.o. female coming in with complaint of left knee pain. Patient has been having swelling in the knee joint. Pain in anteiorr aspect of knee.   Has had increasing pain noted.  Describes it as a dull, throbbing aching pain.  Increasing in swelling.  Known psoriatic arthritis    Past Medical History:  Diagnosis Date  . Arthritis   . De Quervain's disease (tenosynovitis)   . Dermatophytosis, scalp   . Diabetes mellitus   . Hypercholesteremia   . Obesity   . Polycystic ovarian disease   . Psoriatic arthritis ([place])   . Wears glasses    Past Surgical History:  Procedure Laterality Date  . BREAST BIOPSY Right   . CARPAL TUNNEL RELEASE    . COLONOSCOPY    . DE QUERVAIN'S RELEASE  2012   left  . KNEE ARTHROPLASTY    . KNEE ARTHROSCOPY Right 05/06/2013   Procedure: RIGHT KNEE ARTHROSCOPY ;  Surgeon: Hessie Dibble, MD;  Location: [facility];  Service: Orthopedics;  Laterality: Right;  partial lateral minisectomy and chondroplasty  . WISDOM TOOTH EXTRACTION    . WRIST SURGERY  2001   carpel tunnel -rt   Social History   Socioeconomic History  . Marital status: Legally Separated    Spouse name: Not on file  . Number of children: Not on file  . Years of education: Not on file  . Highest education level: Not on file  Occupational History  . 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 Financial resource strain: Not on file  . Food insecurity    Worry:  Not on file    Inability: Not on file  . Transportation needs    Medical: Not on file    Non-medical: Not on file  Tobacco Use  . Smoking status: Former Smoker    Packs/day: 1.30    Years: 12.00    Pack years: 15.60    Types: Cigarettes    Start date: 06/12/1984    Quit date: 03/18/1995    Years since quitting: 24.0  . Smokeless tobacco: Never Used  . Tobacco comment: Not interested in returning to smoking.   Substance and Sexual Activity  . Alcohol use: Yes    Comment: rarely  . Drug use: No  . Sexual activity: Yes    Partners: Male    Comment: married  Lifestyle  . Physical activity    Days per week: Not on file    Minutes per session: Not on file  . Stress: Not on file  Relationships  . Social Herbalist on phone: Not on file    Gets together: Not on file    Attends religious service: Not on file    Active member of club or organization: Not on file    Attends meetings of clubs or organizations: Not on file    Relationship status: Not on file  Other Topics Concern  . Not on file  Social History Narrative  . Not on file   Allergies  Allergen Reactions  . Chocolate Anaphylaxis  . Food     chocolate  . Statins     Intolerant  lft elevation  . Latex Rash   Family History  Problem Relation Age of Onset  . Diabetes Father   . Hypertension Father   . Stroke Father   . Kidney disease Father   . Heart disease Father   . Cancer Mother 26       breast ca died at age 54  . Breast cancer Mother 65  . Hypertension Other   . Diabetes type II Other   . Obesity Other   . Obesity Brother   . Hypertension Brother   . Heart disease Maternal Grandmother   . Alzheimer's disease Maternal Grandmother   . Diabetes Paternal Grandmother   . Heart disease Paternal Grandfather     Current Outpatient Medications (Endocrine & Metabolic):  .  SYNJARDY XR 12.10-998 MG TB24, 2 tablets daily.  Marland Kitchen  VICTOZA 18 MG/3ML SOPN, INJECT 1.8 MG INTO THE SKIN AS DIRECTED DAILY.  (Patient taking differently: 1.2 mg. )    Current Outpatient Medications (Analgesics):  Marland Kitchen  Acetaminophen (TYLENOL PO), Take by mouth as needed. Marland Kitchen  aspirin EC 81 MG tablet, Take 81 mg by mouth daily.  Current Outpatient Medications (Hematological):  .  folic acid (FOLVITE) 1 MG tablet, TAKE 1 TABLET BY MOUTH TWO TIMES DAILY  Current Outpatient Medications (Other):  Marland Kitchen  ACCU-CHEK GUIDE test strip,  .  cholecalciferol (VITAMIN D3) 25 MCG (1000 UT) tablet, Take 1,000 Units by mouth daily. .  clobetasol cream (TEMOVATE) AB-123456789 %, Apply 1 application topically 2 (two) times daily. As directed; never apply to face. .  Clobetasol Propionate Emulsion 0.05 % topical foam, Apply 1 application topically 2 (two) times daily. .  Diclofenac Sodium (PENNSAID) 2 % SOLN, Place 2 application onto the skin 2 (two) times daily. .  methotrexate 50 MG/2ML injection, Inject 0.6 mL under the skin weekly .  neomycin-polymyxin-hydrocortisone (CORTISPORIN) 3.5-10000-1 OTIC suspension, Place 3 drops into both ears 3 (three) times daily. .  Omega-3 Fatty Acids (FISH OIL) 1200 MG CAPS, Take 1-2 capsules (1,200-2,400 mg total) by mouth 2 (two) times daily. Takes 2400 mg qam and 1200 mg qpm .  Secukinumab, 300 MG Dose, (COSENTYX SENSOREADY, 300 MG,) 150 MG/ML SOAJ, Inject 150 mg into the skin every 14 (fourteen) days. .  TURMERIC PO, Take by mouth 2 (two) times daily.  Marland Kitchen  UNIFINE PENTIPS 32G X 4 MM MISC,     Past medical history, social, surgical and family history all reviewed in electronic medical record.  No pertanent information unless stated regarding to the chief complaint.   Review of Systems:  No headache, visual changes, nausea, vomiting, diarrhea, constipation, dizziness, abdominal pain, skin rash, fevers, chills, night sweats, weight loss, swollen lymph nodes, body aches, joint swelling, muscle aches, chest pain, shortness of breath, mood changes.   Objective  Blood pressure 106/66, pulse 82, height 5\' 3"   (1.6 m), weight 235 lb (106.6 kg), SpO2 98 %. Systems examined below as of    General: No apparent distress alert and oriented x3 mood and affect normal, dressed appropriately.  HEENT: Pupils equal, extraocular movements intact  Respiratory: Patient's speak in full sentences and does not appear short of breath  Cardiovascular: No lower extremity edema, non tender, no erythema  Skin: Warm dry intact  with no signs of infection or rash on extremities or on axial skeleton.  Abdomen: Soft nontender  Neuro: Cranial nerves II through XII are intact, neurovascularly intact in all extremities with 2+ DTRs and 2+ pulses.  Lymph: No lymphadenopathy of posterior or anterior cervical chain or axillae bilaterally.  Gait antalgic gait MSK:  Non tender with full range of motion and good stability and symmetric strength and tone of shoulders, elbows, wrist, hip, and ankles bilaterally.  Left knee does have an effusion noted.  Tender to palpation of the patellofemoral joint.  Limited range of motion in all planes.  Instability noted with valgus and varus force.  Procedure: Real-time Ultrasound Guided Injection of left knee Device: GE Logiq Q7 Ultrasound guided injection is preferred based studies that show increased duration, increased effect, greater accuracy, decreased procedural pain, increased response rate, and decreased cost with ultrasound guided versus blind injection.  Verbal informed consent obtained.  Time-out conducted.  Noted no overlying erythema, induration, or other signs of local infection.  Skin prepped in a sterile fashion.  Local anesthesia: Topical Ethyl chloride.  With sterile technique and under real time ultrasound guidance: With a 22-gauge 2 inch needle patient was injected with 4 cc of 0.5% Marcaine and aspirated 50 cc of straw-colored fluid then injected 1 cc of Kenalog 40 mg/dL. This was from a superior lateral approach.  Completed without difficulty  Pain immediately resolved  suggesting accurate placement of the medication.  Advised to call if fevers/chills, erythema, induration, drainage, or persistent bleeding.  Images permanently stored and available for review in the ultrasound unit.  Impression: Technically successful ultrasound guided injection.   Impression and Recommendations:     This case required medical decision making of moderate complexity. The above documentation has been reviewed and is accurate and complete Lyndal Pulley, DO       Note: This dictation was prepared with Dragon dictation along with smaller phrase technology. Any transcriptional errors that result from this process are unintentional.

## 2019-03-26 NOTE — Assessment & Plan Note (Signed)
Patient is having worsening discomfort and pain at this moment.  Patient has had swelling of the left knee pain.  Patient is having increasing instability and would consider the possibility of surgical intervention.  MRI ordered today.depending on findings will discuss treatment options.

## 2019-03-26 NOTE — Patient Instructions (Addendum)
Drained knee today MRI [facility] [phone] Will write to you to tell you next steps

## 2019-03-31 ENCOUNTER — Encounter: Payer: Self-pay | Admitting: Family Medicine

## 2019-04-01 MED FILL — SYNJARDY XR 10-1000 MG TB24: 10-1000 | 90 days supply | Qty: 90 | Fill #0

## 2019-04-09 MED FILL — COSENTYX 300 MG DOSE-2 PENS: 150 | 28 days supply | Qty: 2 | Fill #1

## 2019-04-23 ENCOUNTER — Other Ambulatory Visit: Payer: Self-pay

## 2019-04-23 ENCOUNTER — Ambulatory Visit: Payer: No Typology Code available for payment source | Admitting: Family Medicine

## 2019-04-23 ENCOUNTER — Encounter: Payer: Self-pay | Admitting: Family Medicine

## 2019-04-23 VITALS — BP 120/78 | HR 73 | Ht 63.0 in | Wt 233.0 lb

## 2019-04-23 DIAGNOSIS — M545 Low back pain: Secondary | ICD-10-CM | POA: Diagnosis not present

## 2019-04-23 DIAGNOSIS — M999 Biomechanical lesion, unspecified: Secondary | ICD-10-CM

## 2019-04-23 DIAGNOSIS — G8929 Other chronic pain: Secondary | ICD-10-CM

## 2019-04-23 NOTE — Assessment & Plan Note (Signed)
Stable responded fairly well to osteopathic manipulation.  Discussed posture and ergonomics, discussed which activities to do which wants to avoid.  Patient can increase activity as tolerated.  Discussed icing regimen and home exercises.  Follow-up again in 4 to 6 weeks

## 2019-04-23 NOTE — Assessment & Plan Note (Signed)
Decision today to treat with OMT was based on Physical Exam  After verbal consent patient was treated with HVLA, ME, FPR techniques in cervical, thoracic, rib,  lumbar and sacral areas  Patient tolerated the procedure well with improvement in symptoms  Patient given exercises, stretches and lifestyle modifications  See medications in patient instructions if given  Patient will follow up in 4-8 weeks 

## 2019-04-23 NOTE — Progress Notes (Signed)
[name] [address], [place] [address] Phone: [phone] Subjective:   I Tabitha [name] am serving as a Education administrator for Dr. Hulan Saas.   CC: Knee and back pain  [id number]   03/26/2019 Patient is having worsening discomfort and pain at this moment.  Patient has had swelling of the left knee pain.  Patient is having increasing instability and would consider the possibility of surgical intervention.  MRI ordered today.depending on findings will discuss treatment options.   Update 04/23/2019 Tabitha [name] is a 53 y.o. female coming in with complaint of knee and back pain. Patient has MRI on 04/25/2019. Also here for low back pain in which OMT helps to manage her pain. Patient states back and knees are doing better. Left sided collar bone pain. States that she feels popping with working out.      Past Medical History:  Diagnosis Date   Arthritis    De Quervain's disease (tenosynovitis)    Dermatophytosis, scalp    Diabetes mellitus    Hypercholesteremia    Obesity    Polycystic ovarian disease    Psoriatic arthritis ([place])    Wears glasses    Past Surgical History:  Procedure Laterality Date   BREAST BIOPSY Right    CARPAL TUNNEL RELEASE     COLONOSCOPY     DE QUERVAIN'S RELEASE  2012   left   KNEE ARTHROPLASTY     KNEE ARTHROSCOPY Right 05/06/2013   Procedure: RIGHT KNEE ARTHROSCOPY ;  Surgeon: Hessie Dibble, MD;  Location: [facility];  Service: Orthopedics;  Laterality: Right;  partial lateral minisectomy and chondroplasty   WISDOM TOOTH EXTRACTION     WRIST SURGERY  2001   carpel tunnel -rt   Social History   Socioeconomic History   Marital status: Legally Separated    Spouse name: Not on file   Number of children: Not on file   Years of education: Not on file   Highest education level: Not on file  Occupational History   Occupation: nurse    Employer: [employer]    Comment: at  [employer]  Social Needs   Financial resource strain: Not on file   Food insecurity    Worry: Not on file    Inability: Not on file   Transportation needs    Medical: Not on file    Non-medical: Not on file  Tobacco Use   Smoking status: Former Smoker    Packs/day: 1.30    Years: 12.00    Pack years: 15.60    Types: Cigarettes    Start date: 06/12/1984    Quit date: 03/18/1995    Years since quitting: 24.1   Smokeless tobacco: Never Used   Tobacco comment: Not interested in returning to smoking.   Substance and Sexual Activity   Alcohol use: Yes    Comment: rarely   Drug use: No   Sexual activity: Yes    Partners: Male    Comment: married  Lifestyle   Physical activity    Days per week: Not on file    Minutes per session: Not on file   Stress: Not on file  Relationships   Social connections    Talks on phone: Not on file    Gets together: Not on file    Attends religious service: Not on file    Active member of club or organization: Not on file    Attends meetings of clubs or  organizations: Not on file    Relationship status: Not on file  Other Topics Concern   Not on file  Social History Narrative   Not on file   Allergies  Allergen Reactions   Chocolate Anaphylaxis   Food     chocolate   Statins     Intolerant  lft elevation   Latex Rash   Family History  Problem Relation Age of Onset   Diabetes Father    Hypertension Father    Stroke Father    Kidney disease Father    Heart disease Father    Cancer Mother 14       breast ca died at age 54   Breast cancer Mother 60   Hypertension Other    Diabetes type II Other    Obesity Other    Obesity Brother    Hypertension Brother    Heart disease Maternal Grandmother    Alzheimer's disease Maternal Grandmother    Diabetes Paternal Grandmother    Heart disease Paternal Grandfather     Current Outpatient Medications (Endocrine & Metabolic):    VICTOZA [id number]  SOPN, INJECT 1.8 MG INTO THE SKIN AS DIRECTED DAILY. (Patient taking differently: 1.2 mg. )    Current Outpatient Medications (Analgesics):    Acetaminophen (TYLENOL PO), Take by mouth as needed.   aspirin EC 81 MG tablet, Take 81 mg by mouth daily.  Current Outpatient Medications (Hematological):    folic acid (FOLVITE) 1 MG tablet, TAKE 1 TABLET BY MOUTH TWO TIMES DAILY  Current Outpatient Medications (Other):    ACCU-CHEK GUIDE test strip,    cholecalciferol (VITAMIN D3) 25 MCG (1000 UT) tablet, Take 1,000 Units by mouth daily.   clobetasol cream (TEMOVATE) AB-[id number] %, Apply 1 application topically 2 (two) times daily. As directed; never apply to face.   Clobetasol Propionate Emulsion 0.05 % topical foam, Apply 1 application topically 2 (two) times daily.   Diclofenac Sodium (PENNSAID) 2 % SOLN, Place 2 application onto the skin 2 (two) times daily.   methotrexate 50 MG/2ML injection, Inject 0.6 mL under the skin weekly   neomycin-polymyxin-hydrocortisone (CORTISPORIN) 3.5-10000-1 OTIC suspension, Place 3 drops into both ears 3 (three) times daily.   Omega-3 Fatty Acids (FISH OIL) 1200 MG CAPS, Take 1-2 capsules (1,200-2,400 mg total) by mouth 2 (two) times daily. Takes 2400 mg qam and 1200 mg qpm   Secukinumab, 300 MG Dose, (COSENTYX SENSOREADY, 300 MG,) 150 MG/ML SOAJ, Inject 150 mg into the skin every 14 (fourteen) days.   TURMERIC PO, Take by mouth 2 (two) times daily.    UNIFINE PENTIPS 32G X 4 MM MISC,     Past medical history, social, surgical and family history all reviewed in electronic medical record.  No pertanent information unless stated regarding to the chief complaint.   Review of Systems:  No headache, visual changes, nausea, vomiting, diarrhea, constipation, dizziness, abdominal pain, skin rash, fevers, chills, night sweats, weight loss, swollen lymph nodes, body aches, joint swelling,  chest pain, shortness of breath, mood changes.  Positive muscle  aches  Objective  Blood pressure 120/78, pulse 73, height 5\' 3"  (1.6 m), weight 233 lb (105.7 kg), SpO2 97 %.     General: No apparent distress alert and oriented x3 mood and affect normal, dressed appropriately.  HEENT: Pupils equal, extraocular movements intact  Respiratory: Patient's speak in full sentences and does not appear short of breath  Cardiovascular: No lower extremity edema, non tender, no erythema  Skin: Warm dry intact  with no signs of infection or rash on extremities or on axial skeleton.  Abdomen: Soft nontender  Neuro: Cranial nerves II through XII are intact, neurovascularly intact in all extremities with 2+ DTRs and 2+ pulses.  Lymph: No lymphadenopathy of posterior or anterior cervical chain or axillae bilaterally.  Gait normal with good balance and coordination.  MSK:  tender with limited range of motion and good stability and symmetric strength and tone of shoulders, elbows, wrist, hip, and ankles bilaterally.  Back exam shows the patient does have tightness in the thoracolumbar juncture.  Patient has negative straight leg test.Tightness with Corky Sox test bilaterally.  Osteopathic findings C2 flexed rotated and side bent right C6 flexed rotated and side bent left T3 extended rotated and side bent right inhaled third rib T9 extended rotated and side bent left L2 flexed rotated and side bent right Sacrum right on right  Impression and Recommendations:     This case required medical decision making of moderate complexity. The above documentation has been reviewed and is accurate and complete Lyndal Pulley, DO       Note: This dictation was prepared with Dragon dictation along with smaller phrase technology. Any transcriptional errors that result from this process are unintentional.

## 2019-04-23 NOTE — Patient Instructions (Signed)
[place] stretches Seem again in 5-6 weeks Margit Banda!!!!!

## 2019-04-25 ENCOUNTER — Other Ambulatory Visit: Payer: No Typology Code available for payment source

## 2019-04-30 ENCOUNTER — Other Ambulatory Visit: Payer: Self-pay | Admitting: Pharmacist

## 2019-04-30 ENCOUNTER — Other Ambulatory Visit: Payer: Self-pay | Admitting: Rheumatology

## 2019-04-30 MED ORDER — COSENTYX SENSOREADY (300 MG) 150 MG/ML ~~LOC~~ SOAJ: 150.0000 mg | SUBCUTANEOUS | 0 refills | Status: DC

## 2019-04-30 NOTE — Telephone Encounter (Signed)
Last Visit:12/19/18 Next Visit:05/22/19 Labs: 02/10/19 Glucose is elevated-231. Please notify patient. Rest of CMP WNL. CBC WNL. TB Gold: 02/10/19 Neg   Okay to refill per Dr. Estanislado Pandy

## 2019-05-05 MED FILL — VICTOZA 18 MG/3 ML INJECT P: 18 | 30 days supply | Qty: 9 | Fill #2

## 2019-05-05 MED FILL — COSENTYX 300 MG DOSE-2 PENS: 150 | 28 days supply | Qty: 2 | Fill #0

## 2019-05-20 NOTE — Progress Notes (Signed)
Virtual Visit via Video Note  I connected with Tabitha [name] on 05/22/19 at  8:00 AM EST by a video enabled telemedicine application and verified that I am speaking with the correct person using two identifiers.  Location: Patient: Home  Provider: Clinic  This service was conducted via virtual visit.  Both audio and visual tools were used.  The patient was located at home. I was located in my office.  Consent was obtained prior to the virtual visit and is aware of possible charges through their insurance for this visit.  The patient is an established patient.  Dr. Estanislado Pandy, MD conducted the virtual visit and Hazel Sams, PA-C acted as scribe during the service.  Office staff helped with scheduling follow up visits after the service was conducted.   I discussed the limitations of evaluation and management by telemedicine and the availability of in person appointments. The patient expressed understanding and agreed to proceed.  CC: fatigue  History of Present Illness: Patient is a 53 year old female with a past medical history of psoriatic arthritis and osteoarthritis. She has not had any recent psoriatic arthritis arthritis flares.  She is not having any joint pain or joint swelling.  She denies any achilles tendonitis or plantar fasciitis.  She develops psoriasis in her ears right before she is due for her next cosentyx  Injection.   Review of Systems  Constitutional: Positive for malaise/fatigue. Negative for fever.  Eyes: Negative for photophobia, pain, discharge and redness.  Respiratory: Negative for cough, shortness of breath and wheezing.   Cardiovascular: Negative for chest pain and palpitations.  Gastrointestinal: Negative for blood in stool, constipation and diarrhea.  Genitourinary: Negative for dysuria.  Musculoskeletal: Negative for back pain, joint pain, myalgias and neck pain.       +Morning stiffness   Skin: Negative for rash.  Neurological: Negative for dizziness and  headaches.  Psychiatric/Behavioral: Negative for depression. The patient is not nervous/anxious and does not have insomnia.       Observations/Objective: Physical Exam  Constitutional: She is oriented to person, place, and time and well-developed, well-nourished, and in no distress.  HENT:  Head: Normocephalic and atraumatic.  Eyes: Conjunctivae are normal.  Pulmonary/Chest: Effort normal.  Neurological: She is alert and oriented to person, place, and time.  Psychiatric: Mood, memory, affect and judgment normal.   Patient reports morning stiffness for 30  minutes.   Patient denies nocturnal pain.  Difficulty dressing/grooming: Denies Difficulty climbing stairs: Denies Difficulty getting out of chair: Denies Difficulty using hands for taps, buttons, cutlery, and/or writing: Denies    Assessment and Plan: Visit Diagnoses: Psoriatic arthritis ([place]) - She has not had any recent psoriatic arthritis flares.  She has no joint pain or joint swelling.  She has no achilles tendonitis or plantar fasciitis.  She has no SI joint pain at this time.  She is on cosentyx 150 mg sq injections every 14 days, MTX 0.6 ml sq once weekly, and folic acid 2 mg po daily.  She has not missed any doses recently.  She is due to update lab work.  She will continue on the current treatment regimen.  A refill of MTX will be sent to the pharmacy.  She was advised to notify us if she develops increased joint pain or joint swelling.  She will follow up in 3 months.   Psoriasis - She develops psoriasis in bilateral ear canals towards the end of the cosentyx cycle.   High risk medications (not anticoagulants)  long-term use -Cosentyx 150 mg every 14 days, methotrexate 0.6 ml every 7 days, and folic acid 1 mg 1 tablet twice daily. CBC and CMP stable on 02/10/19.  She is due to update CBC and CMP.  Standing orders are in place.  TB gold negative on 02/10/19.   Erythema nodosum - Resolved   Primary osteoarthritis of both  hands -She is not having any hand pain or joint swelling currently.    Primary osteoarthritis of both knees -  She sees Dr. Tamala Julian for left knee joint pain.  She has had cortisone injections and visco gel injections in the past. She had a left knee joint cortisone injection by Dr. Tamala Julian in October, which has provided a significant relief.    Patellofemoral arthritis of left knee - She is not having any discomfort at this time.   Primary osteoarthritis of both feet -She has no feet pain or joint swelling.   Other medical conditions are listed as follows:   History of diabetes mellitus   History of PCOS   History of hyperlipidemia   Follow Up Instructions: She will follow up in 3 months    I discussed the assessment and treatment plan with the patient. The patient was provided an opportunity to ask questions and all were answered. The patient agreed with the plan and demonstrated an understanding of the instructions.   The patient was advised to call back or seek an in-person evaluation if the symptoms worsen or if the condition fails to improve as anticipated.  I provided 15 minutes of non-face-to-face time during this encounter.   Bo Merino, MD   Scribed by-  Hazel Sams, PA-C

## 2019-05-22 ENCOUNTER — Encounter: Payer: Self-pay | Admitting: Rheumatology

## 2019-05-22 ENCOUNTER — Other Ambulatory Visit: Payer: Self-pay

## 2019-05-22 ENCOUNTER — Telehealth (INDEPENDENT_AMBULATORY_CARE_PROVIDER_SITE_OTHER): Payer: No Typology Code available for payment source | Admitting: Rheumatology

## 2019-05-22 ENCOUNTER — Ambulatory Visit
Admission: RE | Admit: 2019-05-22 | Discharge: 2019-05-22 | Disposition: A | Discharge status: Home / Self Care | Payer: No Typology Code available for payment source | Source: Ambulatory Visit | Attending: Family Medicine | Admitting: Family Medicine

## 2019-05-22 DIAGNOSIS — M25562 Pain in left knee: Secondary | ICD-10-CM

## 2019-05-22 DIAGNOSIS — L52 Erythema nodosum: Secondary | ICD-10-CM

## 2019-05-22 DIAGNOSIS — M1712 Unilateral primary osteoarthritis, left knee: Secondary | ICD-10-CM

## 2019-05-22 DIAGNOSIS — L4050 Arthropathic psoriasis, unspecified: Secondary | ICD-10-CM

## 2019-05-22 DIAGNOSIS — M19041 Primary osteoarthritis, right hand: Secondary | ICD-10-CM | POA: Diagnosis not present

## 2019-05-22 DIAGNOSIS — M19042 Primary osteoarthritis, left hand: Secondary | ICD-10-CM | POA: Diagnosis not present

## 2019-05-22 DIAGNOSIS — M19071 Primary osteoarthritis, right ankle and foot: Secondary | ICD-10-CM

## 2019-05-22 DIAGNOSIS — M19072 Primary osteoarthritis, left ankle and foot: Secondary | ICD-10-CM

## 2019-05-22 DIAGNOSIS — M170 Bilateral primary osteoarthritis of knee: Secondary | ICD-10-CM

## 2019-05-22 DIAGNOSIS — Z79899 Other long term (current) drug therapy: Secondary | ICD-10-CM

## 2019-05-22 DIAGNOSIS — Z8742 Personal history of other diseases of the female genital tract: Secondary | ICD-10-CM

## 2019-05-22 DIAGNOSIS — L409 Psoriasis, unspecified: Secondary | ICD-10-CM

## 2019-05-22 DIAGNOSIS — Z8639 Personal history of other endocrine, nutritional and metabolic disease: Secondary | ICD-10-CM

## 2019-05-22 DIAGNOSIS — G8929 Other chronic pain: Secondary | ICD-10-CM

## 2019-05-22 MED ORDER — METHOTREXATE SODIUM CHEMO INJECTION 50 MG/2ML: INTRAMUSCULAR | 0 refills | Status: DC

## 2019-05-22 NOTE — Addendum Note (Signed)
Addended by: Francis Gaines C on: 05/22/2019 11:34 AM   Modules accepted: Orders

## 2019-05-26 ENCOUNTER — Encounter: Payer: Self-pay | Admitting: *Deleted

## 2019-05-28 ENCOUNTER — Ambulatory Visit (INDEPENDENT_AMBULATORY_CARE_PROVIDER_SITE_OTHER): Payer: No Typology Code available for payment source | Admitting: Family Medicine

## 2019-05-28 ENCOUNTER — Encounter: Payer: Self-pay | Admitting: Family Medicine

## 2019-05-28 VITALS — BP 110/70 | HR 72 | Ht 63.0 in | Wt 233.0 lb

## 2019-05-28 DIAGNOSIS — M170 Bilateral primary osteoarthritis of knee: Secondary | ICD-10-CM

## 2019-05-28 DIAGNOSIS — M999 Biomechanical lesion, unspecified: Secondary | ICD-10-CM | POA: Diagnosis not present

## 2019-05-28 DIAGNOSIS — M545 Low back pain, unspecified: Secondary | ICD-10-CM

## 2019-05-28 DIAGNOSIS — G8929 Other chronic pain: Secondary | ICD-10-CM | POA: Diagnosis not present

## 2019-05-28 MED FILL — METHOTREXATE 25 MG/ML VIAL: 50 | 46 days supply | Qty: 4 | Fill #0

## 2019-05-28 NOTE — Assessment & Plan Note (Signed)
Patient would like to continue conservative therapy at this time and avoid any type of surgical intervention because patient would be likely looking at a replacement

## 2019-05-28 NOTE — Assessment & Plan Note (Signed)
Decision today to treat with OMT was based on Physical Exam  After verbal consent patient was treated with HVLA, ME, FPR techniques in cervical, thoracic, rib,  lumbar and sacral areas  Patient tolerated the procedure well with improvement in symptoms  Patient given exercises, stretches and lifestyle modifications  See medications in patient instructions if given  Patient will follow up in 4-8 weeks 

## 2019-05-28 NOTE — Progress Notes (Signed)
[name] [place], [place] [address] Phone: [phone] Subjective:   [name], am serving as a scribe for Dr. Hulan Saas. This visit occurred during the SARS-CoV-2 public health emergency.  Safety protocols were in place, including screening questions prior to the visit, additional usage of staff PPE, and extensive cleaning of exam room while observing appropriate contact time as indicated for disinfecting solutions.    CC: Back pain, knee pain follow-up  [id number]  Tabitha [name] is a 53 y.o. female coming in with complaint of back pain. Last seen in 04/23/2019 for OMT. Patient has not been doing exercises for shoulder. Pain is unchanged.  Patient states that he does do well though after the manipulation.  Feels like it gives some mild relief for some time and then comes back to this being her baseline.  Continued knee pain. Patient is here to also discuss MRI results.  MRI showed the patient does have grade IV chondromalacia, moderate to severe arthritic changes as well as meniscal tear radial in nature.  This was independently visualized by me.    Past Medical History:  Diagnosis Date  . Arthritis   . De Quervain's disease (tenosynovitis)   . Dermatophytosis, scalp   . Diabetes mellitus   . Hypercholesteremia   . Obesity   . Polycystic ovarian disease   . Psoriatic arthritis ([place])   . Wears glasses    Past Surgical History:  Procedure Laterality Date  . BREAST BIOPSY Right   . CARPAL TUNNEL RELEASE    . COLONOSCOPY    . DE QUERVAIN'S RELEASE  2012   left  . KNEE ARTHROPLASTY    . KNEE ARTHROSCOPY Right 05/06/2013   Procedure: RIGHT KNEE ARTHROSCOPY ;  Surgeon: Hessie Dibble, MD;  Location: [facility];  Service: Orthopedics;  Laterality: Right;  partial lateral minisectomy and chondroplasty  . WISDOM TOOTH EXTRACTION    . WRIST SURGERY  2001   carpel tunnel -rt   Social History   Socioeconomic  History  . Marital status: Legally Separated    Spouse name: Not on file  . Number of children: Not on file  . Years of education: Not on file  . Highest education level: Not on file  Occupational History  . 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 Smoking status: Former Smoker    Packs/day: 1.30    Years: 12.00    Pack years: 15.60    Types: Cigarettes    Start date: 06/12/1984    Quit date: 03/18/1995    Years since quitting: 24.2  . Smokeless tobacco: Never Used  . Tobacco comment: Not interested in returning to smoking.   Substance and Sexual Activity  . Alcohol use: Yes    Comment: rarely  . Drug use: No  . Sexual activity: Yes    Partners: Male    Comment: married  Other Topics Concern  . Not on file  Social History Narrative  . Not on file   Social Determinants of Health   Financial Resource Strain:   . Difficulty of Paying Living Expenses: Not on file  Food Insecurity:   . Worried About Charity fundraiser in the Last Year: Not on file  . Ran Out of Food in the Last Year: Not on file  Transportation Needs:   . Lack of Transportation (Medical): Not on file  .  Lack of Transportation (Non-Medical): Not on file  Physical Activity:   . Days of Exercise per Week: Not on file  . Minutes of Exercise per Session: Not on file  Stress:   . Feeling of Stress : Not on file  Social Connections:   . Frequency of Communication with Friends and Family: Not on file  . Frequency of Social Gatherings with Friends and Family: Not on file  . Attends Religious Services: Not on file  . Active Member of Clubs or Organizations: Not on file  . Attends Archivist Meetings: Not on file  . Marital Status: Not on file   Allergies  Allergen Reactions  . Chocolate Anaphylaxis  . Food     chocolate  . Statins     Intolerant  lft elevation  . Latex Rash   Family History  Problem Relation Age of Onset  . Diabetes Father     . Hypertension Father   . Stroke Father   . Kidney disease Father   . Heart disease Father   . Cancer Mother 25       breast ca died at age 29  . Breast cancer Mother 30  . Hypertension Other   . Diabetes type II Other   . Obesity Other   . Obesity Brother   . Hypertension Brother   . Heart disease Maternal Grandmother   . Alzheimer's disease Maternal Grandmother   . Diabetes Paternal Grandmother   . Heart disease Paternal Grandfather     Current Outpatient Medications (Endocrine & Metabolic):  Marland Kitchen  VICTOZA 18 MG/3ML SOPN, INJECT 1.8 MG INTO THE SKIN AS DIRECTED DAILY. (Patient taking differently: 1.2 mg. )    Current Outpatient Medications (Analgesics):  Marland Kitchen  Acetaminophen (TYLENOL PO), Take by mouth as needed. Marland Kitchen  aspirin EC 81 MG tablet, Take 81 mg by mouth daily.  Current Outpatient Medications (Hematological):  .  folic acid (FOLVITE) 1 MG tablet, TAKE 1 TABLET BY MOUTH TWO TIMES DAILY  Current Outpatient Medications (Other):  Marland Kitchen  ACCU-CHEK GUIDE test strip,  .  cholecalciferol (VITAMIN D3) 25 MCG (1000 UT) tablet, Take 1,000 Units by mouth daily. .  clobetasol cream (TEMOVATE) AB-123456789 %, Apply 1 application topically 2 (two) times daily. As directed; never apply to face. .  Clobetasol Propionate Emulsion 0.05 % topical foam, Apply 1 application topically 2 (two) times daily. .  Diclofenac Sodium (PENNSAID) 2 % SOLN, Place 2 application onto the skin 2 (two) times daily. .  methotrexate 50 MG/2ML injection, Inject 0.6 mL under the skin weekly .  neomycin-polymyxin-hydrocortisone (CORTISPORIN) 3.5-10000-1 OTIC suspension, Place 3 drops into both ears 3 (three) times daily. .  Omega-3 Fatty Acids (FISH OIL) 1200 MG CAPS, Take 1-2 capsules (1,200-2,400 mg total) by mouth 2 (two) times daily. Takes 2400 mg qam and 1200 mg qpm .  Secukinumab, 300 MG Dose, (COSENTYX SENSOREADY, 300 MG,) 150 MG/ML SOAJ, Inject 150 mg into the skin every 14 (fourteen) days. .  TURMERIC PO, Take by mouth  2 (two) times daily.  Marland Kitchen  UNIFINE PENTIPS 32G X 4 MM MISC,     Past medical history, social, surgical and family history all reviewed in electronic medical record.  No pertanent information unless stated regarding to the chief complaint.   Review of Systems:  No headache, visual changes, nausea, vomiting, diarrhea, constipation, dizziness, abdominal pain, skin rash, fevers, chills, night sweats, weight loss, swollen lymph nodes, body aches, joint swelling,  chest pain, shortness of breath, mood changes.  Positive muscle aches  Objective  Blood pressure 110/70, pulse 72, height 5\' 3"  (1.6 m), weight 233 lb (105.7 kg), SpO2 98 %.    General: No apparent distress alert and oriented x3 mood and affect normal, dressed appropriately.  HEENT: Pupils equal, extraocular movements intact  Respiratory: Patient's speak in full sentences and does not appear short of breath  Cardiovascular: No lower extremity edema, non tender, no erythema  Skin: Warm dry intact with no signs of infection or rash on extremities or on axial skeleton.  Abdomen: Soft nontender  Neuro: Cranial nerves II through XII are intact, neurovascularly intact in all extremities with 2+ DTRs and 2+ pulses.  Lymph: No lymphadenopathy of posterior or anterior cervical chain or axillae bilaterally.  Gait normal with good balance and coordination.  MSK:  Non tender with full range of motion and good stability and symmetric strength and tone of shoulders, elbows, wrist, hip, and ankles bilaterally.  Back exam does have some mild loss of lordosis.  Patient is some tenderness to palpation over the paraspinal musculature lumbar spine right greater than left.  Mild positive Corky Sox.  Negative straight leg test.  Left knee exam demonstrates swelling noted.  Some mild instability with valgus force.  Abnormal thigh to calf ratio.  Osteopathic findings C4 flexed rotated and side bent left C7 flexed rotated and side bent left T3 extended rotated  and side bent right inhaled third rib T5 extended rotated and side bent left L2 flexed rotated and side bent right Sacrum right on right    Impression and Recommendations:     This case required medical decision making of moderate complexity. The above documentation has been reviewed and is accurate and complete Lyndal Pulley, DO       Note: This dictation was prepared with Dragon dictation along with smaller phrase technology. Any transcriptional errors that result from this process are unintentional.

## 2019-05-28 NOTE — Assessment & Plan Note (Signed)
Back pain.  Discussed posture and ergonomics, discussed continuing to work on her weight loss.  I do believe that that is contributing to some of the discomfort and pain.  Responding very well to osteopathic manipulation.  Patient will be changing jobs soon and congratulated her on this.  Follow-up again 4 to 8 weeks

## 2019-05-28 NOTE — Patient Instructions (Signed)
  [address], [address] [place], [place] [address] Phone [phone]  See me again in 5 weeks Happy Holidays!

## 2019-06-04 MED FILL — COSENTYX 300 MG DOSE-2 PENS: 150 | 28 days supply | Qty: 2 | Fill #1

## 2019-06-09 ENCOUNTER — Encounter: Payer: Self-pay | Admitting: Rheumatology

## 2019-06-09 ENCOUNTER — Encounter: Payer: Self-pay | Admitting: Internal Medicine

## 2019-06-13 LAB — COMPLETE METABOLIC PANEL WITH GFR
AG Ratio: 1.7 (calc) (ref 1.0–2.5)
ALT: 26 U/L (ref 6–29)
AST: 16 U/L (ref 10–35)
Albumin: 4 g/dL (ref 3.6–5.1)
Alkaline phosphatase (APISO): 79 U/L (ref 37–153)
BUN: 13 mg/dL (ref 7–25)
CO2: 28 mmol/L (ref 20–32)
Calcium: 9.1 mg/dL (ref 8.6–10.4)
Chloride: 106 mmol/L (ref 98–110)
Creat: 0.64 mg/dL (ref 0.50–1.05)
GFR, Est African American: 117 mL/min/{1.73_m2} (ref >=60)
GFR, Est Non African American: 101 mL/min/{1.73_m2} (ref >=60)
Globulin: 2.4 g/dL (calc) (ref 1.9–3.7)
Glucose, Bld: 148 mg/dL — ABNORMAL HIGH (ref 65–99)
Potassium: 4.1 mmol/L (ref 3.5–5.3)
Sodium: 140 mmol/L (ref 135–146)
Total Bilirubin: 0.4 mg/dL (ref 0.2–1.2)
Total Protein: 6.4 g/dL (ref 6.1–8.1)

## 2019-06-13 LAB — CBC WITH DIFFERENTIAL/PLATELET
Absolute Monocytes: 533 cells/uL (ref 200–950)
Basophils Absolute: 59 cells/uL (ref 0–200)
Basophils Relative: 0.9 %
Eosinophils Absolute: 163 cells/uL (ref 15–500)
Eosinophils Relative: 2.5 %
HCT: 41.3 % (ref 35.0–45.0)
Hemoglobin: 13.4 g/dL (ref 11.7–15.5)
Lymphs Abs: 1261 cells/uL (ref 850–3900)
MCH: 29.2 pg (ref 27.0–33.0)
MCHC: 32.4 g/dL (ref 32.0–36.0)
MCV: 90 fL (ref 80.0–100.0)
MPV: 11.4 fL (ref 7.5–12.5)
Monocytes Relative: 8.2 %
Neutro Abs: 4485 cells/uL (ref 1500–7800)
Neutrophils Relative %: 69 %
Platelets: 228 10*3/uL (ref 140–400)
RBC: 4.59 10*6/uL (ref 3.80–5.10)
RDW: 13.6 % (ref 11.0–15.0)
Total Lymphocyte: 19.4 %
WBC: 6.5 10*3/uL (ref 3.8–10.8)

## 2019-06-16 NOTE — Progress Notes (Signed)
CBC is normal.  CMP shows elevation of glucose.  Please notify patient

## 2019-06-30 MED FILL — UNIFINE PENTIPS 31GX3/16: 31G X 5 MM | 90 days supply | Qty: 100 | Fill #2

## 2019-06-30 MED FILL — UNIFINE PENTIPS 31GX3/16": 31G X 5 MM | 90 days supply | Qty: 100 | Fill #2

## 2019-07-01 MED FILL — COSENTYX 300 MG DOSE-2 PENS: 150 | 28 days supply | Qty: 2 | Fill #2

## 2019-07-01 MED FILL — VICTOZA 18 MG/3 ML INJECT P: 18 | 30 days supply | Qty: 9 | Fill #3

## 2019-07-02 ENCOUNTER — Ambulatory Visit: Payer: No Typology Code available for payment source | Admitting: Family Medicine

## 2019-07-09 ENCOUNTER — Ambulatory Visit: Payer: No Typology Code available for payment source | Admitting: Family Medicine

## 2019-07-09 ENCOUNTER — Other Ambulatory Visit: Payer: Self-pay

## 2019-07-09 ENCOUNTER — Encounter: Payer: Self-pay | Admitting: Family Medicine

## 2019-07-09 VITALS — BP 100/80 | HR 75 | Ht 63.0 in | Wt 238.0 lb

## 2019-07-09 DIAGNOSIS — L4050 Arthropathic psoriasis, unspecified: Secondary | ICD-10-CM | POA: Diagnosis not present

## 2019-07-09 DIAGNOSIS — G8929 Other chronic pain: Secondary | ICD-10-CM | POA: Diagnosis not present

## 2019-07-09 DIAGNOSIS — M545 Low back pain: Secondary | ICD-10-CM

## 2019-07-09 DIAGNOSIS — M999 Biomechanical lesion, unspecified: Secondary | ICD-10-CM | POA: Diagnosis not present

## 2019-07-09 NOTE — Progress Notes (Signed)
[facility] [address] [place] Phone: [phone] Subjective:   I Tabitha [name] am serving as a Education administrator for Dr. Hulan Saas.  This visit occurred during the SARS-CoV-2 public health emergency.  Safety protocols were in place, including screening questions prior to the visit, additional usage of staff PPE, and extensive cleaning of exam room while observing appropriate contact time as indicated for disinfecting solutions.   I'm seeing this patient by the request  of:  Hoyt Koch, MD  CC: Low back pain follow-up  [id number]  Tabitha [name] is a 54 y.o. female coming in with complaint of back pain. Patient states she is in some pain today. Shoulder pops at times.  No pain with a popping.  Still some tightness in the neck.  Has gotten a new job.  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  Feels that that is somewhat stressed but is excited about the job.     Past Medical History:  Diagnosis Date  . Arthritis   . De Quervain's disease (tenosynovitis)   . Dermatophytosis, scalp   . Diabetes mellitus   . Hypercholesteremia   . Obesity   . Polycystic ovarian disease   . Psoriatic arthritis ([place])   . Wears glasses    Past Surgical History:  Procedure Laterality Date  . BREAST BIOPSY Right   . CARPAL TUNNEL RELEASE    . COLONOSCOPY    . DE QUERVAIN'S RELEASE  2012   left  . KNEE ARTHROPLASTY    . KNEE ARTHROSCOPY Right 05/06/2013   Procedure: RIGHT KNEE ARTHROSCOPY ;  Surgeon: Hessie Dibble, MD;  Location: [facility];  Service: Orthopedics;  Laterality: Right;  partial lateral minisectomy and chondroplasty  . WISDOM TOOTH EXTRACTION    . WRIST SURGERY  2001   carpel tunnel -rt   Social History   Socioeconomic History  . Marital status: Legally Separated    Spouse name: Not on file  . Number of children: Not on file  . Years of education: Not on file  . Highest education level: Not on file    Occupational History  . 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 Smoking status: Former Smoker    Packs/day: 1.30    Years: 12.00    Pack years: 15.60    Types: Cigarettes    Start date: 06/12/1984    Quit date: 03/18/1995    Years since quitting: 24.3  . Smokeless tobacco: Never Used  . Tobacco comment: Not interested in returning to smoking.   Substance and Sexual Activity  . Alcohol use: Yes    Comment: rarely  . Drug use: No  . Sexual activity: Yes    Partners: Male    Comment: married  Other Topics Concern  . Not on file  Social History Narrative  . Not on file   Social Determinants of Health   Financial Resource Strain:   . Difficulty of Paying Living Expenses: Not on file  Food Insecurity:   . Worried About Charity fundraiser in the Last Year: Not on file  . Ran Out of Food in the Last Year: Not on file  Transportation Needs:   . Lack of Transportation (Medical): Not on file  . Lack of Transportation (Non-Medical): Not on file  Physical Activity:   . Days of Exercise per Week: Not on file  . Minutes of  Exercise per Session: Not on file  Stress:   . Feeling of Stress : Not on file  Social Connections:   . Frequency of Communication with Friends and Family: Not on file  . Frequency of Social Gatherings with Friends and Family: Not on file  . Attends Religious Services: Not on file  . Active Member of Clubs or Organizations: Not on file  . Attends Archivist Meetings: Not on file  . Marital Status: Not on file   Allergies  Allergen Reactions  . Chocolate Anaphylaxis  . Food     chocolate  . Statins     Intolerant  lft elevation  . Latex Rash   Family History  Problem Relation Age of Onset  . Diabetes Father   . Hypertension Father   . Stroke Father   . Kidney disease Father   . Heart disease Father   . Cancer Mother 44       breast ca died at age 5  . Breast cancer Mother 65  .  Hypertension Other   . Diabetes type II Other   . Obesity Other   . Obesity Brother   . Hypertension Brother   . Heart disease Maternal Grandmother   . Alzheimer's disease Maternal Grandmother   . Diabetes Paternal Grandmother   . Heart disease Paternal Grandfather     Current Outpatient Medications (Endocrine & Metabolic):  Marland Kitchen  VICTOZA 18 MG/3ML SOPN, INJECT 1.8 MG INTO THE SKIN AS DIRECTED DAILY. (Patient taking differently: 1.2 mg. )    Current Outpatient Medications (Analgesics):  Marland Kitchen  Acetaminophen (TYLENOL PO), Take by mouth as needed. Marland Kitchen  aspirin EC 81 MG tablet, Take 81 mg by mouth daily.  Current Outpatient Medications (Hematological):  .  folic acid (FOLVITE) 1 MG tablet, TAKE 1 TABLET BY MOUTH TWO TIMES DAILY  Current Outpatient Medications (Other):  Marland Kitchen  ACCU-CHEK GUIDE test strip,  .  cholecalciferol (VITAMIN D3) 25 MCG (1000 UT) tablet, Take 1,000 Units by mouth daily. .  clobetasol cream (TEMOVATE) [id number] %, Apply 1 application topically 2 (two) times daily. As directed; never apply to face. .  Clobetasol Propionate Emulsion 0.05 % topical foam, Apply 1 application topically 2 (two) times daily. .  Diclofenac Sodium (PENNSAID) 2 % SOLN, Place 2 application onto the skin 2 (two) times daily. .  methotrexate 50 MG/2ML injection, Inject 0.6 mL under the skin weekly .  neomycin-polymyxin-hydrocortisone (CORTISPORIN) 3.5-10000-1 OTIC suspension, Place 3 drops into both ears 3 (three) times daily. .  Omega-3 Fatty Acids (FISH OIL) 1200 MG CAPS, Take 1-2 capsules (1,200-2,400 mg total) by mouth 2 (two) times daily. Takes 2400 mg qam and 1200 mg qpm .  Secukinumab, 300 MG Dose, (COSENTYX SENSOREADY, 300 MG,) 150 MG/ML SOAJ, Inject 150 mg into the skin every 14 (fourteen) days. .  TURMERIC PO, Take by mouth 2 (two) times daily.  Marland Kitchen  UNIFINE PENTIPS 32G X 4 MM MISC,    Reviewed prior external information including notes and imaging from  primary care provider As well as notes that  were available from care everywhere and other healthcare systems.  Past medical history, social, surgical and family history all reviewed in electronic medical record.  No pertanent information unless stated regarding to the chief complaint.   Review of Systems:  No headache, visual changes, nausea, vomiting, diarrhea, constipation, dizziness, abdominal pain, skin rash, fevers, chills, night sweats, weight loss, swollen lymph nodes, body aches, joint swelling, chest pain, shortness of breath, mood changes.  POSITIVE muscle aches  Objective  Blood pressure 100/80, pulse 75, height 5\' 3"  (1.6 m), weight 238 lb (108 kg), SpO2 98 %.   General: No apparent distress alert and oriented x3 mood and affect normal, dressed appropriately.  HEENT: Pupils equal, extraocular movements intact  Respiratory: Patient's speak in full sentences and does not appear short of breath  Cardiovascular: No lower extremity edema, non tender, no erythema  Skin: Warm dry intact with no signs of infection or rash on extremities or on axial skeleton.  Abdomen: Soft nontender  Neuro: Cranial nerves II through XII are intact, neurovascularly intact in all extremities with 2+ DTRs and 2+ pulses.  Lymph: No lymphadenopathy of posterior or anterior cervical chain or axillae bilaterally.  Gait normal with good balance and coordination.  MSK: Mild arthritic changes especially of the knees. Back exam does have some poor core strength.  Patient does have some loss of lordosis.  Tightness noted in the paraspinal musculature lumbar spine right greater than left.  Osteopathic findings  C2 flexed rotated and side bent right C4 flexed rotated and side bent left C6 flexed rotated and side bent left T3 extended rotated and side bent right inhaled third rib T9 extended rotated and side bent left L2 flexed rotated and side bent right Sacrum right on right  Total time reviewing patient's chart as well as seen patient was greater than  40 minutes   Impression and Recommendations:     This case required medical decision making of moderate complexity. The above documentation has been reviewed and is accurate and complete Lyndal Pulley, DO       Note: This dictation was prepared with Dragon dictation along with smaller phrase technology. Any transcriptional errors that result from this process are unintentional.

## 2019-07-09 NOTE — Patient Instructions (Addendum)
Good to see you Keep it up doing great Ice before bed See me again in 6 weeks

## 2019-07-10 ENCOUNTER — Encounter: Payer: Self-pay | Admitting: Family Medicine

## 2019-07-10 NOTE — Assessment & Plan Note (Signed)
Chronic condition is stable.  This includes patient's osteopathic findings as well.  Patient seems to be doing relatively well but we need to encourage still with the weight loss.  We discussed ergonomics in great detail as well.  Discussed with patient working at home that this could be something that needs to work including an adjustable standing desk.  Icing regimen, home exercises, follow-up again in 4 to 8 weeks

## 2019-07-10 NOTE — Assessment & Plan Note (Signed)
Decision today to treat with OMT was based on Physical Exam  After verbal consent patient was treated with HVLA, ME, FPR techniques in cervical, thoracic, rib,  lumbar and sacral areas  Patient tolerated the procedure well with improvement in symptoms  Patient given exercises, stretches and lifestyle modifications  See medications in patient instructions if given  Patient will follow up in 4-8 weeks 

## 2019-07-13 ENCOUNTER — Other Ambulatory Visit: Payer: Self-pay | Admitting: Rheumatology

## 2019-07-13 DIAGNOSIS — L4050 Arthropathic psoriasis, unspecified: Secondary | ICD-10-CM

## 2019-07-14 MED FILL — METHOTREXATE 25 MG/ML VIAL: 50 | 46 days supply | Qty: 4 | Fill #1

## 2019-07-23 ENCOUNTER — Other Ambulatory Visit: Payer: Self-pay | Admitting: Pharmacist

## 2019-07-23 ENCOUNTER — Other Ambulatory Visit: Payer: Self-pay | Admitting: Internal Medicine

## 2019-07-23 ENCOUNTER — Other Ambulatory Visit: Payer: Self-pay | Admitting: Rheumatology

## 2019-07-23 MED ORDER — COSENTYX SENSOREADY (300 MG) 150 MG/ML ~~LOC~~ SOAJ: 150.0000 mg | SUBCUTANEOUS | 0 refills | Status: DC

## 2019-07-23 NOTE — Telephone Encounter (Signed)
Last Visit: 05/22/19 Next Visit: 08/21/19 Labs: 06/12/19 CBC is normal. CMP shows elevation of glucose.  TB Gold: 02/10/19 Neg   Okay to refill per Dr. Estanislado Pandy

## 2019-07-29 MED FILL — COSENTYX 300 MG DOSE-2 PENS: 150 | 28 days supply | Qty: 2 | Fill #0

## 2019-07-29 MED FILL — FOLIC ACID 1 MG TABS: 1 | 90 days supply | Qty: 180 | Fill #1

## 2019-08-13 NOTE — Progress Notes (Signed)
Office Visit Note  Patient: Tabitha [name]             Date of Birth: 22-Oct-1965           MRN: [id number]             PCP: Hoyt Koch, MD Referring: Hoyt Koch, * Visit Date: 08/21/2019 Occupation: @GUAROCC @  Subjective:  Fatigue   History of Present Illness: Tabitha [name]  is a 54 y.o. female with history of psoriatic arthritis and osteoarthritis.  She is on Cosentyx 150 mg subcutaneous injections every 14 days, methotrexate 0.6 mL once weekly, and folic acid 2 mg by mouth daily.  She states she continues to have occasional flares.  She states that yesterday she was experiencing pain and stiffness in her right hand and was having difficulty opening doors due to the discomfort.  She denied any joint swelling during that time.  She states overall her knee joints have been doing well.  She has been working on lower extremity muscle strengthening has been working with a trainer twice a week.  She has occasional pain in her feet but denies any joint swelling.  She denies any Achilles tendinitis or plantar fasciitis.  She has ongoing tenderness in both SI joints.  She states the pain is better with exercise.  She has ongoing generalized muscle tenderness, fatigue, and brain fog.  Her fatigue can be significant at times.  She states she has small patches of psoriasis around her eyes and uses clobetasol topically.  She states she develops right eye redness for about 3-4 days after her cosentyx injections.  She denies any eye pain or photophobia.  She sees her opthamologist on a regular basis.   Activities of Daily Living:  Patient reports morning stiffness for 30-45 minutes.   Patient Reports nocturnal pain.  Difficulty dressing/grooming: Denies Difficulty climbing stairs: Reports Difficulty getting out of chair: Reports Difficulty using hands for taps, buttons, cutlery, and/or writing: Reports  Review of Systems  Constitutional: Positive for fatigue.  HENT: Positive for nose  dryness. Negative for mouth sores and mouth dryness.   Eyes: Negative for pain, itching, visual disturbance and dryness.  Respiratory: Negative for cough, hemoptysis, shortness of breath and difficulty breathing.   Cardiovascular: Negative for chest pain, palpitations, hypertension and swelling in legs/feet.  Gastrointestinal: Negative for blood in stool, constipation and diarrhea.  Endocrine: Negative for increased urination.  Genitourinary: Negative for difficulty urinating and painful urination.  Musculoskeletal: Positive for arthralgias, joint pain, joint swelling and morning stiffness. Negative for myalgias, muscle weakness, muscle tenderness and myalgias.  Skin: Negative for color change, pallor, rash, hair loss, nodules/bumps, skin tightness, ulcers and sensitivity to sunlight.  Allergic/Immunologic: Negative for susceptible to infections.  Neurological: Negative for dizziness, numbness, headaches and memory loss.  Hematological: Negative for bruising/bleeding tendency and swollen glands.  Psychiatric/Behavioral: Positive for sleep disturbance. Negative for depressed mood and confusion. The patient is not nervous/anxious.     PMFS History:  Patient Active Problem List   Diagnosis Date Noted  . Mass of left chest wall 03/18/2019  . Palpitations 12/12/2018  . Sore throat 11/07/2018  . Foot callus 08/20/2018  . Left hand pain 08/01/2018  . Ear symptom 04/22/2018  . Nonallopathic lesion of lumbosacral region 03/14/2018  . Back pain 12/21/2017  . Nonallopathic lesion of cervical region 12/21/2017  . Biceps tendinitis of left shoulder 11/14/2017  . Primary osteoarthritis of both hands 10/30/2017  . Primary osteoarthritis of both feet 10/30/2017  .  Rash 09/17/2017  . Routine general medical examination at a health care facility 04/05/2017  . Degenerative arthritis of knee, bilateral 12/20/2016  . Nonallopathic lesion of sacral region 10/04/2013  . Nonallopathic lesion of thoracic  region 10/04/2013  . Abnormal mammogram 07/04/2012  . Obesity 05/28/2012  . Psoriatic arthritis ([place]) 03/12/2012  . Diabetes mellitus due to underlying condition with hyperglycemia ([place]) 10/03/2011  . Psoriasis 03/30/2011  . Hyperlipidemia associated with type 2 diabetes mellitus ([place]) 05/25/2009  . Nonallopathic lesion of rib cage 05/25/2009  . POLYCYSTIC OVARIES 04/07/2009    Past Medical History:  Diagnosis Date  . Arthritis   . De Quervain's disease (tenosynovitis)   . Dermatophytosis, scalp   . Diabetes mellitus   . Hypercholesteremia   . Obesity   . Polycystic ovarian disease   . Psoriatic arthritis ([place])   . Wears glasses     Family History  Problem Relation Age of Onset  . Diabetes Father   . Hypertension Father   . Stroke Father   . Kidney disease Father   . Heart disease Father   . Cancer Mother 41       breast ca died at age 68  . Breast cancer Mother 40  . Hypertension Other   . Diabetes type II Other   . Obesity Other   . Obesity Brother   . Hypertension Brother   . Heart disease Maternal Grandmother   . Alzheimer's disease Maternal Grandmother   . Diabetes Paternal Grandmother   . Heart disease Paternal Grandfather    Past Surgical History:  Procedure Laterality Date  . BREAST BIOPSY Right   . CARPAL TUNNEL RELEASE    . COLONOSCOPY    . DE QUERVAIN'S RELEASE  2012   left  . KNEE ARTHROPLASTY    . KNEE ARTHROSCOPY Right 05/06/2013   Procedure: RIGHT KNEE ARTHROSCOPY ;  Surgeon: Hessie Dibble, MD;  Location: [facility];  Service: Orthopedics;  Laterality: Right;  partial lateral minisectomy and chondroplasty  . WISDOM TOOTH EXTRACTION    . WRIST SURGERY  2001   carpel tunnel -rt   Social History   Social History Narrative  . Not on file   Immunization History  Administered Date(s) Administered  . Influenza,inj,Quad PF,6+ Mos 03/26/2014  . Influenza-Unspecified 04/11/2015, 03/26/2017, 03/26/2018  . Tdap 06/12/2010      Objective: Vital Signs: BP 124/82 (BP Location: Left Arm, Patient Position: Sitting, Cuff Size: Normal)   Pulse 79   Resp 14   Ht 5\' 3"  (1.6 m)   Wt 237 lb 12.8 oz (107.9 kg)   BMI 42.12 kg/m    Physical Exam Vitals and nursing note reviewed.  Constitutional:      Appearance: She is well-developed.  HENT:     Head: Normocephalic and atraumatic.  Eyes:     Conjunctiva/sclera: Conjunctivae normal.  Pulmonary:     Effort: Pulmonary effort is normal.  Abdominal:     General: Bowel sounds are normal.     Palpations: Abdomen is soft.  Musculoskeletal:     Cervical back: Normal range of motion.  Lymphadenopathy:     Cervical: No cervical adenopathy.  Skin:    General: Skin is warm and dry.     Capillary Refill: Capillary refill takes less than 2 seconds.  Neurological:     Mental Status: She is alert and oriented to person, place, and time.  Psychiatric:        Behavior: Behavior normal.  Musculoskeletal Exam: C-spine, thoracic spine, and lumbar spine good ROM.  No midline spinal tenderness.   Tenderness over both SI joints.  Shoulder joints, elbow joints, wrist joints, MCPs, PIPs, and DIPs good ROM with no synovitis.  PIP and DIP thickening consistent with osteoarthritis.  Hip joints, knee joints, ankle joints, MTPs, PIPs, and DIPs good ROM with no synovitis.  No warmth or effusion of knee joints. No tenderness or inflammation of ankle joints.  No achilles tendonitis or plantar fasciitis.   CDAI Exam: CDAI Score: 0.8  Patient Global: 4 mm; Provider Global: 4 mm Swollen: 0 ; Tender: 0  Joint Exam 08/21/2019   No joint exam has been documented for this visit   There is currently no information documented on the homunculus. Go to the Rheumatology activity and complete the homunculus joint exam.  Investigation: No additional findings.  Imaging: No results found.  Recent Labs: Lab Results  Component Value Date   WBC 6.5 06/12/2019   HGB 13.4 06/12/2019   PLT  228 06/12/2019   NA 140 06/12/2019   K 4.1 06/12/2019   CL 106 06/12/2019   CO2 28 06/12/2019   GLUCOSE 148 (H) 06/12/2019   BUN 13 06/12/2019   CREATININE 0.64 06/12/2019   BILITOT 0.4 06/12/2019   ALKPHOS 71 04/05/2017   AST 16 06/12/2019   ALT 26 06/12/2019   PROT 6.4 06/12/2019   ALBUMIN 3.7 04/05/2017   CALCIUM 9.1 06/12/2019   GFRAA 117 06/12/2019   QFTBGOLD Negative 01/09/2017   QFTBGOLDPLUS NEGATIVE 02/10/2019    Speciality Comments: Failed Humira/Remicade  Procedures:  No procedures performed Allergies: Chocolate, Food, Statins, and Latex   Assessment / Plan:     Visit Diagnoses: Psoriatic arthritis ([place]): She has no synovitis or dactylitis on exam.  She is clinically doing well on Cosentyx 150 mg subcu injections every 14 days, methotrexate 0.6 mL once weekly, and folic acid 2 mg by mouth daily.  She experiences intermittent discomfort in both hands and both feet.  She has SI joint tenderness bilaterally.  She has not had any Achilles tendinitis or plantar fasciitis.  She has a few small patches of psoriasis around her eyes.  A prescription for desonide 0.05% was sent to the pharmacy today.  She will continue on Cosentyx and methotrexate as prescribed.  She is advised to notify us if she develops increased joint pain or joint swelling.  Psoriasis: She has small patches of psoriasis at the medial and lateral canthus bilaterally.  She has been applying clobetasol topically.  We discussed that she should using clobetasol on her face.  A prescription for desonide 0.05% was sent to the pharmacy.   High risk medications (not anticoagulants) long-term use - Cosentyx 150 mg sq injections every 14 days, methotrexate 0.6 ml every 7 days, and folic acid 1 mg 1 tablet twice daily. - Plan: CBC with Differential/Platelet, COMPLETE METABOLIC PANEL WITH GFR  Erythema nodosum - Resolved   Primary osteoarthritis of both hands: She has PIP and DIP thickening consistent with osteoarthritis  of both hands.  No tenderness or synovitis was noted.  She has complete fist formation bilaterally.  Joint protection and muscle strengthening were discussed.  Primary osteoarthritis of both knees  She follows up with Dr. Tamala Julian on a regular basis.  She has good range of motion of both knee joints on exam.  No warmth or effusion was noted.  She has bilateral knee crepitus.  We discussed the importance of lower extremity muscle strengthening.  Patellofemoral  arthritis of left knee: She has left knee crepitus.  Primary osteoarthritis of both feet: She is occasional discomfort in both feet.  Ankle joints have good range of motion no tenderness or synovitis.  No Achilles tendinitis or plantar fasciitis.  Redness of right eye: She experiences red eye redness and irritation for about 3 to 4 days after her Cosentyx injections.  She has not had any photophobia or floaters recently.  She has been using antihistamine drops without any relief.  She was advised to call her ophthalmologist today to see if she can be evaluated urgently while her symptoms are occurring.   Other fatigue: She continues to experience significant fatigue.  We discussed the importance of regular exercise and good sleep hygiene.  She has tried taking Nuvigil in the past to help with brain fog but noticed worsening anxiety and had to discontinue.  She was encouraged to continue to take omega-3.  Other medical conditions are listed as follows:  History of PCOS  History of hyperlipidemia  History of diabetes mellitus  Orders: Orders Placed This Encounter  Procedures  . CBC with Differential/Platelet  . COMPLETE METABOLIC PANEL WITH GFR   Meds ordered this encounter  Medications  . desonide (DESOWEN) 0.05 % cream    Sig: Apply topically 2 (two) times daily as needed.    Dispense:  30 g    Refill:  0    Face-to-face time spent with patient was 30 minutes. Greater than 50% of time was spent in counseling and coordination of  care.  Follow-Up Instructions: Return in about 5 months (around 01/21/2020) for Psoriatic arthritis, Osteoarthritis.   Ofilia Neas, PA-C  Note - This record has been created using Dragon software.  Chart creation errors have been sought, but may not always  have been located. Such creation errors do not reflect on  the standard of medical care.

## 2019-08-19 NOTE — Progress Notes (Signed)
[place] Phone: [phone] Subjective:   [name], am serving as a scribe for Dr. Hulan Saas. This visit occurred during the SARS-CoV-2 public health emergency.  Safety protocols were in place, including screening questions prior to the visit, additional usage of staff PPE, and extensive cleaning of exam room while observing appropriate contact time as indicated for disinfecting solutions.   I'm seeing this patient by the request  of:  Hoyt Koch, MD  CC: Low back pain, neck pain follow-up  [id number]  Tabitha [name] is a 54 y.o. female coming in with complaint of back pain. Last seen on 07/09/2019 for OMT. Patient states having a mild exacerbation.  Feels like some of her rheumatoid arthritis or psoriatic arthritis is likely contributing to some of the aches and pains.  Patient has been under more stress going through a divorce as well as a new job recently.  Patient also still working on the ergonomics with working at home on a more regular basis as well as working on a computer and on a regular basis.     Past Medical History:  Diagnosis Date  . Arthritis   . De Quervain's disease (tenosynovitis)   . Dermatophytosis, scalp   . Diabetes mellitus   . Hypercholesteremia   . Obesity   . Polycystic ovarian disease   . Psoriatic arthritis ([place])   . Wears glasses    Past Surgical History:  Procedure Laterality Date  . BREAST BIOPSY Right   . CARPAL TUNNEL RELEASE    . COLONOSCOPY    . DE QUERVAIN'S RELEASE  2012   left  . KNEE ARTHROPLASTY    . KNEE ARTHROSCOPY Right 05/06/2013   Procedure: RIGHT KNEE ARTHROSCOPY ;  Surgeon: Hessie Dibble, MD;  Location: [facility];  Service: Orthopedics;  Laterality: Right;  partial lateral minisectomy and chondroplasty  . WISDOM TOOTH EXTRACTION    . WRIST SURGERY  2001   carpel tunnel -rt   Social History   Socioeconomic History   . Marital status: Legally Separated    Spouse name: Not on file  . Number of children: Not on file  . Years of education: Not on file  . Highest education level: Not on file  Occupational History  . 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 Smoking status: Former Smoker    Packs/day: 1.30    Years: 12.00    Pack years: 15.60    Types: Cigarettes    Start date: 06/12/1984    Quit date: 03/18/1995    Years since quitting: 24.4  . Smokeless tobacco: Never Used  . Tobacco comment: Not interested in returning to smoking.   Substance and Sexual Activity  . Alcohol use: Yes    Comment: rarely  . Drug use: No  . Sexual activity: Yes    Partners: Male    Comment: married  Other Topics Concern  . Not on file  Social History Narrative  . Not on file   Social Determinants of Health   Financial Resource Strain:   . Difficulty of Paying Living Expenses: Not on file  Food Insecurity:   . Worried About Charity fundraiser in the Last Year: Not on file  . Ran Out of Food in the Last Year: Not on file  Transportation Needs:   . Lack of Transportation (Medical): Not  on file  . Lack of Transportation (Non-Medical): Not on file  Physical Activity:   . Days of Exercise per Week: Not on file  . Minutes of Exercise per Session: Not on file  Stress:   . Feeling of Stress : Not on file  Social Connections:   . Frequency of Communication with Friends and Family: Not on file  . Frequency of Social Gatherings with Friends and Family: Not on file  . Attends Religious Services: Not on file  . Active Member of Clubs or Organizations: Not on file  . Attends Archivist Meetings: Not on file  . Marital Status: Not on file   Allergies  Allergen Reactions  . Chocolate Anaphylaxis  . Food     chocolate  . Statins     Intolerant  lft elevation  . Latex Rash   Family History  Problem Relation Age of Onset  . Diabetes Father   .  Hypertension Father   . Stroke Father   . Kidney disease Father   . Heart disease Father   . Cancer Mother 31       breast ca died at age 31  . Breast cancer Mother 80  . Hypertension Other   . Diabetes type II Other   . Obesity Other   . Obesity Brother   . Hypertension Brother   . Heart disease Maternal Grandmother   . Alzheimer's disease Maternal Grandmother   . Diabetes Paternal Grandmother   . Heart disease Paternal Grandfather     Current Outpatient Medications (Endocrine & Metabolic):  Marland Kitchen  VICTOZA 18 MG/3ML SOPN, INJECT 1.8 MG INTO THE SKIN AS DIRECTED DAILY. (Patient taking differently: 1.2 mg. )    Current Outpatient Medications (Analgesics):  Marland Kitchen  Acetaminophen (TYLENOL PO), Take by mouth as needed. Marland Kitchen  aspirin EC 81 MG tablet, Take 81 mg by mouth daily.  Current Outpatient Medications (Hematological):  .  folic acid (FOLVITE) 1 MG tablet, TAKE 1 TABLET BY MOUTH TWO TIMES DAILY  Current Outpatient Medications (Other):  Marland Kitchen  ACCU-CHEK GUIDE test strip,  .  cholecalciferol (VITAMIN D3) 25 MCG (1000 UT) tablet, Take 1,000 Units by mouth daily. .  clobetasol cream (TEMOVATE) [id number] %, Apply 1 application topically 2 (two) times daily. As directed; never apply to face. .  Clobetasol Propionate Emulsion 0.05 % topical foam, Apply 1 application topically 2 (two) times daily. .  Diclofenac Sodium (PENNSAID) 2 % SOLN, Place 2 application onto the skin 2 (two) times daily. .  methotrexate 50 MG/2ML injection, Inject 0.6 mL under the skin weekly .  neomycin-polymyxin-hydrocortisone (CORTISPORIN) 3.5-10000-1 OTIC suspension, Place 3 drops into both ears 3 (three) times daily. .  Omega-3 Fatty Acids (FISH OIL) 1200 MG CAPS, Take 1-2 capsules (1,200-2,400 mg total) by mouth 2 (two) times daily. Takes 2400 mg qam and 1200 mg qpm .  Secukinumab, 300 MG Dose, (COSENTYX SENSOREADY, 300 MG,) 150 MG/ML SOAJ, Inject 150 mg into the skin every 14 (fourteen) days. .  TURMERIC PO, Take by mouth 2  (two) times daily.  Marland Kitchen  UNIFINE PENTIPS 32G X 4 MM MISC,    Reviewed prior external information including notes and imaging from  primary care provider As well as notes that were available from care everywhere and other healthcare systems.  Past medical history, social, surgical and family history all reviewed in electronic medical record.  No pertanent information unless stated regarding to the chief complaint.   Review of Systems:  No headache, visual changes,  nausea, vomiting, diarrhea, constipation, dizziness, abdominal pain, skin rash, fevers, chills, night sweats, weight loss, swollen lymph nodes, body aches, joint swelling, chest pain, shortness of breath, mood changes. POSITIVE muscle aches  Objective  Blood pressure 108/70, pulse 79, height 5\' 3"  (1.6 m), weight 236 lb (107 kg), SpO2 96 %.   General: No apparent distress alert and oriented x3 mood and affect normal, dressed appropriately.  HEENT: Pupils equal, extraocular movements intact  Respiratory: Patient's speak in full sentences and does not appear short of breath  Cardiovascular: No lower extremity edema, non tender, no erythema  Skin: Warm dry intact with no signs of infection or rash on extremities or on axial skeleton.  Abdomen: Soft nontender  Neuro: Cranial nerves II through XII are intact, neurovascularly intact in all extremities with 2+ DTRs and 2+ pulses.  Lymph: No lymphadenopathy of posterior or anterior cervical chain or axillae bilaterally.  Gait normal with good balance and coordination.  MSK: Mild arthritic changes of multiple joints \ Lower lumbar exam does have some mild loss of lordosis.  Tender to palpation in paraspinal musculature more in the thoracolumbar juncture as well as the 1 in the lumbosacral area.  Positive Faber bilaterally.  Negative straight leg test though noted.  Does have pain with straight leg test in the knees themselves.  Neurovascularly intact distally.  Osteopathic findings  C2  flexed rotated and side bent right C6 flexed rotated and side bent left T3 extended rotated and side bent right inhaled third rib T5 extended rotated and side bent left L2 flexed rotated and side bent right Sacrum right on right     Impression and Recommendations:     This case required medical decision making of moderate complexity. The above documentation has been reviewed and is accurate and complete Lyndal Pulley, DO       Note: This dictation was prepared with Dragon dictation along with smaller phrase technology. Any transcriptional errors that result from this process are unintentional.

## 2019-08-19 NOTE — Assessment & Plan Note (Signed)
Decision today to treat with OMT was based on Physical Exam  After verbal consent patient was treated with HVLA, ME, FPR techniques in cervical, thoracic, rib,  lumbar and sacral areas  Patient tolerated the procedure well with improvement in symptoms  Patient given exercises, stretches and lifestyle modifications  See medications in patient instructions if given  Patient will follow up in 4-8 weeks 

## 2019-08-19 NOTE — Assessment & Plan Note (Signed)
Chronic with mild exacerbation.  Likely causing some of the aches and pains.  Patient though has done relatively well with other conservative therapy including home exercises discussed home and ergonomic changes with patient now working on her computer more regularly.  Patient has known arthritic changes of multiple joints and we will monitor.  Social determinants of health including still patient underlying conditions that stops her from exercises on a regular basis.  Follow-up again in 4 to 8 weeks

## 2019-08-20 ENCOUNTER — Ambulatory Visit: Payer: No Typology Code available for payment source | Admitting: Family Medicine

## 2019-08-20 ENCOUNTER — Encounter: Payer: Self-pay | Admitting: Family Medicine

## 2019-08-20 ENCOUNTER — Other Ambulatory Visit: Payer: Self-pay

## 2019-08-20 VITALS — BP 108/70 | HR 79 | Ht 63.0 in | Wt 236.0 lb

## 2019-08-20 DIAGNOSIS — L4050 Arthropathic psoriasis, unspecified: Secondary | ICD-10-CM | POA: Diagnosis not present

## 2019-08-20 DIAGNOSIS — M999 Biomechanical lesion, unspecified: Secondary | ICD-10-CM | POA: Diagnosis not present

## 2019-08-20 NOTE — Patient Instructions (Signed)
2 IBU and 2 Tylenol a day See me in 4-5 weeks

## 2019-08-21 ENCOUNTER — Encounter: Payer: Self-pay | Admitting: Physician Assistant

## 2019-08-21 ENCOUNTER — Other Ambulatory Visit: Payer: Self-pay

## 2019-08-21 ENCOUNTER — Ambulatory Visit: Payer: No Typology Code available for payment source | Admitting: Physician Assistant

## 2019-08-21 VITALS — BP 124/82 | HR 79 | Resp 14 | Ht 63.0 in | Wt 237.8 lb

## 2019-08-21 DIAGNOSIS — M19072 Primary osteoarthritis, left ankle and foot: Secondary | ICD-10-CM

## 2019-08-21 DIAGNOSIS — Z8639 Personal history of other endocrine, nutritional and metabolic disease: Secondary | ICD-10-CM

## 2019-08-21 DIAGNOSIS — L52 Erythema nodosum: Secondary | ICD-10-CM | POA: Diagnosis not present

## 2019-08-21 DIAGNOSIS — L409 Psoriasis, unspecified: Secondary | ICD-10-CM | POA: Diagnosis not present

## 2019-08-21 DIAGNOSIS — L4050 Arthropathic psoriasis, unspecified: Secondary | ICD-10-CM | POA: Diagnosis not present

## 2019-08-21 DIAGNOSIS — H5789 Other specified disorders of eye and adnexa: Secondary | ICD-10-CM

## 2019-08-21 DIAGNOSIS — M170 Bilateral primary osteoarthritis of knee: Secondary | ICD-10-CM

## 2019-08-21 DIAGNOSIS — M19041 Primary osteoarthritis, right hand: Secondary | ICD-10-CM

## 2019-08-21 DIAGNOSIS — M1712 Unilateral primary osteoarthritis, left knee: Secondary | ICD-10-CM

## 2019-08-21 DIAGNOSIS — M19071 Primary osteoarthritis, right ankle and foot: Secondary | ICD-10-CM

## 2019-08-21 DIAGNOSIS — R5383 Other fatigue: Secondary | ICD-10-CM

## 2019-08-21 DIAGNOSIS — Z8742 Personal history of other diseases of the female genital tract: Secondary | ICD-10-CM

## 2019-08-21 DIAGNOSIS — M19042 Primary osteoarthritis, left hand: Secondary | ICD-10-CM

## 2019-08-21 DIAGNOSIS — Z79899 Other long term (current) drug therapy: Secondary | ICD-10-CM | POA: Diagnosis not present

## 2019-08-21 LAB — COMPLETE METABOLIC PANEL WITH GFR
AG Ratio: 1.5 (calc) (ref 1.0–2.5)
ALT: 22 U/L (ref 6–29)
AST: 16 U/L (ref 10–35)
Albumin: 4.1 g/dL (ref 3.6–5.1)
Alkaline phosphatase (APISO): 80 U/L (ref 37–153)
BUN: 16 mg/dL (ref 7–25)
CO2: 26 mmol/L (ref 20–32)
Calcium: 9.8 mg/dL (ref 8.6–10.4)
Chloride: 104 mmol/L (ref 98–110)
Creat: 0.69 mg/dL (ref 0.50–1.05)
GFR, Est African American: 114 mL/min/{1.73_m2} (ref >=60)
GFR, Est Non African American: 98 mL/min/{1.73_m2} (ref >=60)
Globulin: 2.7 g/dL (calc) (ref 1.9–3.7)
Glucose, Bld: 149 mg/dL — ABNORMAL HIGH (ref 65–99)
Potassium: 4.5 mmol/L (ref 3.5–5.3)
Sodium: 139 mmol/L (ref 135–146)
Total Bilirubin: 0.3 mg/dL (ref 0.2–1.2)
Total Protein: 6.8 g/dL (ref 6.1–8.1)

## 2019-08-21 LAB — CBC WITH DIFFERENTIAL/PLATELET
Absolute Monocytes: 728 cells/uL (ref 200–950)
Basophils Absolute: 52 cells/uL (ref 0–200)
Basophils Relative: 0.8 %
Eosinophils Absolute: 169 cells/uL (ref 15–500)
Eosinophils Relative: 2.6 %
HCT: 44 % (ref 35.0–45.0)
Hemoglobin: 14.2 g/dL (ref 11.7–15.5)
Lymphs Abs: 1248 cells/uL (ref 850–3900)
MCH: 29.2 pg (ref 27.0–33.0)
MCHC: 32.3 g/dL (ref 32.0–36.0)
MCV: 90.5 fL (ref 80.0–100.0)
MPV: 11.6 fL (ref 7.5–12.5)
Monocytes Relative: 11.2 %
Neutro Abs: 4303 cells/uL (ref 1500–7800)
Neutrophils Relative %: 66.2 %
Platelets: 214 10*3/uL (ref 140–400)
RBC: 4.86 10*6/uL (ref 3.80–5.10)
RDW: 14 % (ref 11.0–15.0)
Total Lymphocyte: 19.2 %
WBC: 6.5 10*3/uL (ref 3.8–10.8)

## 2019-08-21 MED ORDER — DESONIDE 0.05 % EX CREA: TOPICAL_CREAM | Freq: Two times a day (BID) | CUTANEOUS | 0 refills | Status: DC | PRN

## 2019-08-21 MED FILL — DESONIDE 0.05% CREAM: 0.05 | 15 days supply | Qty: 30 | Fill #0

## 2019-08-21 MED FILL — VICTOZA 18 MG/3 ML INJECT P: 18 | 30 days supply | Qty: 9 | Fill #0

## 2019-08-22 NOTE — Progress Notes (Signed)
Glucose is elevated-149.  Rest of CMP WNL.  CBC WNL

## 2019-08-26 MED FILL — COSENTYX 300 MG DOSE-2 PENS: 150 | 28 days supply | Qty: 2 | Fill #1

## 2019-08-27 ENCOUNTER — Encounter: Payer: Self-pay | Admitting: Family Medicine

## 2019-08-28 ENCOUNTER — Other Ambulatory Visit: Payer: Self-pay

## 2019-08-28 DIAGNOSIS — L4050 Arthropathic psoriasis, unspecified: Secondary | ICD-10-CM

## 2019-09-01 ENCOUNTER — Other Ambulatory Visit: Payer: Self-pay | Admitting: Rheumatology

## 2019-09-01 DIAGNOSIS — L4050 Arthropathic psoriasis, unspecified: Secondary | ICD-10-CM

## 2019-09-01 NOTE — Telephone Encounter (Signed)
Last Visit: 08/21/19 Next Visit: 01/23/20 Labs: 08/21/19 Glucose is elevated-149. Rest of CMP WNL. CBC WNL  Current Dose per office note on 08/21/19: methotrexate 0.6 mL once weekly  Okay to refill per Dr. Estanislado Pandy

## 2019-09-08 MED FILL — METHOTREXATE 25 MG/ML VIAL: 50 | 46 days supply | Qty: 4 | Fill #0

## 2019-09-17 ENCOUNTER — Ambulatory Visit: Payer: No Typology Code available for payment source | Admitting: Family Medicine

## 2019-09-17 ENCOUNTER — Other Ambulatory Visit: Payer: Self-pay

## 2019-09-17 ENCOUNTER — Encounter: Payer: Self-pay | Admitting: Family Medicine

## 2019-09-17 VITALS — BP 110/64 | HR 61 | Ht 63.0 in | Wt 234.0 lb

## 2019-09-17 DIAGNOSIS — L4050 Arthropathic psoriasis, unspecified: Secondary | ICD-10-CM

## 2019-09-17 DIAGNOSIS — M999 Biomechanical lesion, unspecified: Secondary | ICD-10-CM | POA: Diagnosis not present

## 2019-09-17 NOTE — Progress Notes (Signed)
[place] Phone: [phone] Subjective:   Tabitha Tabitha [name], am serving as a scribe for Dr. Hulan [name]. This visit occurred during the SARS-CoV-2 public health emergency.  Safety protocols were in place, including screening questions prior to the visit, additional usage of staff PPE, and extensive cleaning of exam room while observing appropriate contact time as indicated for disinfecting solutions.   I'm seeing this patient by the request  of:  Tabitha Koch, MD  CC: Low back pain and allover pain follow-up  [id number]  Tabitha Tabitha [name] is a 54 y.o. female coming in with complaint of back pain. Last seen on 08/20/2019 for OMT. Patient states doing relatively well but does have some tightness.  Rates the severity of pain is 5 out of 10.  Feels like she did have an exacerbation but is improving slowly at the moment.     Past Medical History:  Diagnosis Date  . Arthritis   . De Quervain's disease (tenosynovitis)   . Dermatophytosis, scalp   . Diabetes mellitus   . Hypercholesteremia   . Obesity   . Polycystic ovarian disease   . Psoriatic arthritis ([place])   . Wears glasses    Past Surgical History:  Procedure Laterality Date  . BREAST BIOPSY Right   . CARPAL TUNNEL RELEASE    . COLONOSCOPY    . DE QUERVAIN'S RELEASE  2012   left  . KNEE ARTHROPLASTY    . KNEE ARTHROSCOPY Right 05/06/2013   Procedure: RIGHT KNEE ARTHROSCOPY ;  Surgeon: Hessie Dibble, MD;  Location: [facility];  Service: Orthopedics;  Laterality: Right;  partial lateral minisectomy and chondroplasty  . WISDOM TOOTH EXTRACTION    . WRIST SURGERY  2001   carpel tunnel -rt   Social History   Socioeconomic History  . Marital status: Legally Separated    Spouse name: Not on file  . Number of children: Not on file  . Years of education: Not on file  . Highest education level: Not on file  Occupational History  .  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 Smoking status: Former Smoker    Packs/day: 1.30    Years: 12.00    Pack years: 15.60    Types: Cigarettes    Start date: 06/12/1984    Quit date: 03/18/1995    Years since quitting: 24.5  . Smokeless tobacco: Never Used  . Tobacco comment: Not interested in returning to smoking.   Substance and Sexual Activity  . Alcohol use: Yes    Comment: rarely  . Drug use: Tabitha [name]  . Sexual activity: Yes    Partners: Male    Comment: married  Other Topics Concern  . Not on file  Social History Narrative  . Not on file   Social Determinants of Health   Financial Resource Strain:   . Difficulty of Paying Living Expenses:   Food Insecurity:   . Worried About Charity fundraiser in the Last Year:   . Arboriculturist in the Last Year:   Transportation Needs:   . Film/video editor (Medical):   Marland Kitchen Lack of Transportation (Non-Medical):   Physical Activity:   . Days of Exercise per Week:   . Minutes of Exercise per Session:   Stress:   . Feeling of Stress :   Social Connections:   .  Frequency of Communication with Friends and Family:   . Frequency of Social Gatherings with Friends and Family:   . Attends Religious Services:   . Active Member of Clubs or Organizations:   . Attends Archivist Meetings:   Marland Kitchen Marital Status:    Allergies  Allergen Reactions  . Chocolate Anaphylaxis  . Food     chocolate  . Statins     Intolerant  lft elevation  . Latex Rash   Family History  Problem Relation Age of Onset  . Diabetes Father   . Hypertension Father   . Stroke Father   . Kidney disease Father   . Heart disease Father   . Cancer Mother 56       breast ca died at age 5  . Breast cancer Mother 16  . Hypertension Other   . Diabetes type II Other   . Obesity Other   . Obesity Brother   . Hypertension Brother   . Heart disease Maternal Grandmother   . Alzheimer's disease Maternal  Grandmother   . Diabetes Paternal Grandmother   . Heart disease Paternal Grandfather     Current Outpatient Medications (Endocrine & Metabolic):  .  SYNJARDY XR 03-999 MG TB24, daily. Marland Kitchen  VICTOZA 18 MG/3ML SOPN, INJECT 1.8 MG INTO THE SKIN AS DIRECTED DAILY. (Patient taking differently: 1.2 mg. )    Current Outpatient Medications (Analgesics):  Marland Kitchen  Acetaminophen (TYLENOL PO), Take by mouth as needed. Marland Kitchen  aspirin EC 81 MG tablet, Take 81 mg by mouth daily.  Current Outpatient Medications (Hematological):  .  folic acid (FOLVITE) 1 MG tablet, TAKE 1 TABLET BY MOUTH TWO TIMES DAILY  Current Outpatient Medications (Other):  Marland Kitchen  ACCU-CHEK GUIDE test strip,  .  cholecalciferol (VITAMIN D3) 25 MCG (1000 UT) tablet, Take 1,000 Units by mouth daily. .  clobetasol cream (TEMOVATE) [id number] %, Apply 1 application topically 2 (two) times daily. As directed; never apply to face. .  Clobetasol Propionate Emulsion 0.05 % topical foam, Apply 1 application topically 2 (two) times daily. Marland Kitchen  desonide (DESOWEN) 0.05 % cream, Apply topically 2 (two) times daily as needed. .  Diclofenac Sodium (PENNSAID) 2 % SOLN, Place 2 application onto the skin 2 (two) times daily. .  methotrexate 50 MG/2ML injection, INJECT 0.6 ML UNDER THE SKIN WEEKLY .  Omega-3 Fatty Acids (FISH OIL) 1200 MG CAPS, Take 1-2 capsules (1,200-2,400 mg total) by mouth 2 (two) times daily. Takes 2400 mg qam and 1200 mg qpm .  Secukinumab, 300 MG Dose, (COSENTYX SENSOREADY, 300 MG,) 150 MG/ML SOAJ, Inject 150 mg into the skin every 14 (fourteen) days. .  TURMERIC PO, Take by mouth 2 (two) times daily.  Marland Kitchen  UNIFINE PENTIPS 32G X 4 MM MISC,    Reviewed prior external information including notes and imaging from  primary care provider As well as notes that were available from care everywhere and other healthcare systems.  Past medical history, social, surgical and family history all reviewed in electronic medical record.  Tabitha [name] pertanent information  unless stated regarding to the chief complaint.   Review of Systems:  Tabitha [name] headache, visual changes, nausea, vomiting, diarrhea, constipation, dizziness, abdominal pain, skin rash, fevers, chills, night sweats, weight loss, swollen lymph nodes, body aches, joint swelling, chest pain, shortness of breath, mood changes. POSITIVE muscle aches  Objective  Blood pressure 110/64, pulse 61, height 5\' 3"  (1.6 m), weight 234 lb (106.1 kg), SpO2 97 %.   General: Tabitha [name] apparent distress  alert and oriented x3 mood and affect normal, dressed appropriately.  Overweight HEENT: Pupils equal, extraocular movements intact  Respiratory: Patient's speak in full sentences and does not appear short of breath  Cardiovascular: Tabitha [name] lower extremity edema, non tender, Tabitha [name] erythema  Neuro: Cranial nerves II through XII are intact, neurovascularly intact in all extremities with 2+ DTRs and 2+ pulses.  Gait normal with good balance and coordination.  MSK: Mild arthritic changes of multiple joints  Patient back exam does have loss of lordosis.  Patient does have some tightness noted in the paraspinal musculature lumbar spine right greater than left.  Tightness with Corky Sox and straight leg..  Is more positive on the left  Osteopathic findings C2 flexed rotated and side bent right C6 flexed rotated and side bent left T3 extended rotated and side bent right inhaled third rib T9 extended rotated and side bent left L2 flexed rotated and side bent right Sacrum left on left     Impression and Recommendations:     This case required medical decision making of moderate complexity. The above documentation has been reviewed and is accurate and complete Lyndal Pulley, DO       Note: This dictation was prepared with Dragon dictation along with smaller phrase technology. Any transcriptional errors that result from this process are unintentional.

## 2019-09-17 NOTE — Assessment & Plan Note (Signed)

## 2019-09-17 NOTE — Assessment & Plan Note (Signed)
Psoriatic arthritis symptoms have some exacerbation from time to time in a mild 1 month patient is coming off about the moment.  Started relatively well with the different medications we have including the Tylenol spelt scheduled regularly, topical anti-inflammatories vitamin D supplementation, encourage patient to continue to work on the weight loss.  Very happy with where patient is more hopeful than usual.  Follow-up again in 4 to 8 weeks

## 2019-09-17 NOTE — Patient Instructions (Signed)
No real changes Very happy for you See me again in 7 weeks

## 2019-09-23 MED FILL — COSENTYX 300 MG DOSE-2 PENS: 150 | 28 days supply | Qty: 2 | Fill #2

## 2019-09-24 ENCOUNTER — Encounter: Payer: Self-pay | Admitting: Pharmacist

## 2019-09-24 ENCOUNTER — Other Ambulatory Visit: Payer: Self-pay

## 2019-09-24 ENCOUNTER — Ambulatory Visit (HOSPITAL_BASED_OUTPATIENT_CLINIC_OR_DEPARTMENT_OTHER): Payer: No Typology Code available for payment source | Admitting: Pharmacist

## 2019-09-24 DIAGNOSIS — Z79899 Other long term (current) drug therapy: Secondary | ICD-10-CM

## 2019-09-24 NOTE — Progress Notes (Signed)
   S: Patient presents for review of her specialty medication.  Patient is currently taking Cosentyx for psoriatic arthritis. Patient is managed by Dr. Estanislado Pandy for this but she plans to start seeing a different specialist. She has an appointment with that provider on 09/29/2019.  Adherence: denies any missed doses  Efficacy: reports that it is working okay for her but not really well.   Dosing: 150 mg every 14 days  Screening: TB test: completed per patient Hepatitis: history of elevated LFTs, being followed - last labs WNL  Monitoring: S/sx of infection: denies  CBC: WNL 08/21/2019 S/sx of hypersensitivity: denies S/sx of malignancy: denies Latex allergy: does have allergy but has been able to take the medication with only a little bit of redness  O:   Lab Results  Component Value Date   WBC 6.5 08/21/2019   HGB 14.2 08/21/2019   HCT 44.0 08/21/2019   MCV 90.5 08/21/2019   PLT 214 08/21/2019      Chemistry      Component Value Date/Time   NA 139 08/21/2019 0834   K 4.5 08/21/2019 0834   CL 104 08/21/2019 0834   CO2 26 08/21/2019 0834   BUN 16 08/21/2019 0834   CREATININE 0.69 08/21/2019 0834      Component Value Date/Time   CALCIUM 9.8 08/21/2019 0834   ALKPHOS 71 04/05/2017 0817   AST 16 08/21/2019 0834   ALT 22 08/21/2019 0834   BILITOT 0.3 08/21/2019 0834       A/P: 1. Medication review: Patient currently on Cosentyx for psoriatic arthritis and tolerating it well with continued control. Reviewed medication with her, including the following: Cosentyx is a monoclonal antibody used to treat psoriatic arthritis. Possible adverse effects include hypersensitivity, infections, inflammatory bowel disease and injection site reactions. Patient should avoid live vaccinations unless otherwise instructed. Recommend regular lab work with rheumatologist. No recommendations for any changes at this time.  Benard Halsted, PharmD, [place] [phone]

## 2019-09-25 ENCOUNTER — Other Ambulatory Visit: Payer: Self-pay

## 2019-09-25 ENCOUNTER — Ambulatory Visit (INDEPENDENT_AMBULATORY_CARE_PROVIDER_SITE_OTHER): Payer: No Typology Code available for payment source | Admitting: Internal Medicine

## 2019-09-25 ENCOUNTER — Encounter: Payer: Self-pay | Admitting: Internal Medicine

## 2019-09-25 VITALS — BP 116/70 | HR 68 | Temp 98.1°F | Ht 63.0 in | Wt 238.0 lb

## 2019-09-25 DIAGNOSIS — E0865 Diabetes mellitus due to underlying condition with hyperglycemia: Secondary | ICD-10-CM

## 2019-09-25 DIAGNOSIS — E785 Hyperlipidemia, unspecified: Secondary | ICD-10-CM | POA: Diagnosis not present

## 2019-09-25 DIAGNOSIS — Z0000 Encounter for general adult medical examination without abnormal findings: Secondary | ICD-10-CM

## 2019-09-25 DIAGNOSIS — E1169 Type 2 diabetes mellitus with other specified complication: Secondary | ICD-10-CM | POA: Diagnosis not present

## 2019-09-25 DIAGNOSIS — L4050 Arthropathic psoriasis, unspecified: Secondary | ICD-10-CM

## 2019-09-25 LAB — LIPID PANEL
Cholesterol: 206 mg/dL — ABNORMAL HIGH (ref 0–200)
HDL: 59.5 mg/dL (ref >39.00)
LDL Cholesterol: 122 mg/dL — ABNORMAL HIGH (ref 0–99)
NonHDL: 146.75
Total CHOL/HDL Ratio: 3
Triglycerides: 125 mg/dL (ref 0.0–149.0)
VLDL: 25 mg/dL (ref 0.0–40.0)

## 2019-09-25 NOTE — Assessment & Plan Note (Signed)
Currently on cosentyx and methotrexate and folic acid. Switching rheumatologists and still having some mental fog symptoms which are impairing life. Advised to try prevagen to see if this helps at all.

## 2019-09-25 NOTE — Assessment & Plan Note (Signed)
Flu shot up to date. Covid-19 complete. Pneumonia declines today. Shingrix not indicated given medications. Tetanus up to date. Colonoscopy up to date. Mammogram up to date with gyn, pap smear up to date with gyn. Counseled about sun safety and mole surveillance. Counseled about the dangers of distracted driving. Given 10 year screening recommendations.

## 2019-09-25 NOTE — Assessment & Plan Note (Signed)
Checking lipid panel and adjust as needed for goal <100.

## 2019-09-25 NOTE — Assessment & Plan Note (Signed)
Weight is up a couple pounds with pandemic but she is exercising well and moderate diet.

## 2019-09-25 NOTE — Patient Instructions (Addendum)
Prevagen is something to try for the memory.  Health Maintenance, Female Adopting a healthy lifestyle and getting preventive care are important in promoting health and wellness. Ask your health care provider about:  The right schedule for you to have regular tests and exams.  Things you can do on your own to prevent diseases and keep yourself healthy. What should I know about diet, weight, and exercise? Eat a healthy diet   Eat a diet that includes plenty of vegetables, fruits, low-fat dairy products, and lean protein.  Do not eat a lot of foods that are high in solid fats, added sugars, or sodium. Maintain a healthy weight Body mass index (BMI) is used to identify weight problems. It estimates body fat based on height and weight. Your health care provider can help determine your BMI and help you achieve or maintain a healthy weight. Get regular exercise Get regular exercise. This is one of the most important things you can do for your health. Most adults should:  Exercise for at least 150 minutes each week. The exercise should increase your heart rate and make you sweat (moderate-intensity exercise).  Do strengthening exercises at least twice a week. This is in addition to the moderate-intensity exercise.  Spend less time sitting. Even light physical activity can be beneficial. Watch cholesterol and blood lipids Have your blood tested for lipids and cholesterol at 55 years of age, then have this test every 5 years. Have your cholesterol levels checked more often if:  Your lipid or cholesterol levels are high.  You are older than 55 years of age.  You are at high risk for heart disease. What should I know about cancer screening? Depending on your health history and family history, you may need to have cancer screening at various ages. This may include screening for:  Breast cancer.  Cervical cancer.  Colorectal cancer.  Skin cancer.  Lung cancer. What should I know about  heart disease, diabetes, and high blood pressure? Blood pressure and heart disease  High blood pressure causes heart disease and increases the risk of stroke. This is more likely to develop in people who have high blood pressure readings, are of African descent, or are overweight.  Have your blood pressure checked: ? Every 3-5 years if you are 77-80 years of age. ? Every year if you are 54 years old or older. Diabetes Have regular diabetes screenings. This checks your fasting blood sugar level. Have the screening done:  Once every three years after age 68 if you are at a normal weight and have a low risk for diabetes.  More often and at a younger age if you are overweight or have a high risk for diabetes. What should I know about preventing infection? Hepatitis B If you have a higher risk for hepatitis B, you should be screened for this virus. Talk with your health care provider to find out if you are at risk for hepatitis B infection. Hepatitis C Testing is recommended for:  Everyone born from [birth year] through 1966.  Anyone with known risk factors for hepatitis C. Sexually transmitted infections (STIs)  Get screened for STIs, including gonorrhea and chlamydia, if: ? You are sexually active and are younger than 55 years of age. ? You are older than 55 years of age and your health care provider tells you that you are at risk for this type of infection. ? Your sexual activity has changed since you were last screened, and you are at increased risk for  chlamydia or gonorrhea. Ask your health care provider if you are at risk.  Ask your health care provider about whether you are at high risk for HIV. Your health care provider may recommend a prescription medicine to help prevent HIV infection. If you choose to take medicine to prevent HIV, you should first get tested for HIV. You should then be tested every 3 months for as long as you are taking the medicine. Pregnancy  If you are about to  stop having your period (premenopausal) and you may become pregnant, seek counseling before you get pregnant.  Take 400 to 800 micrograms (mcg) of folic acid every day if you become pregnant.  Ask for birth control (contraception) if you want to prevent pregnancy. Osteoporosis and menopause Osteoporosis is a disease in which the bones lose minerals and strength with aging. This can result in bone fractures. If you are 43 years old or older, or if you are at risk for osteoporosis and fractures, ask your health care provider if you should:  Be screened for bone loss.  Take a calcium or vitamin D supplement to lower your risk of fractures.  Be given hormone replacement therapy (HRT) to treat symptoms of menopause. Follow these instructions at home: Lifestyle  Do not use any products that contain nicotine or tobacco, such as cigarettes, e-cigarettes, and chewing tobacco. If you need help quitting, ask your health care provider.  Do not use street drugs.  Do not share needles.  Ask your health care provider for help if you need support or information about quitting drugs. Alcohol use  Do not drink alcohol if: ? Your health care provider tells you not to drink. ? You are pregnant, may be pregnant, or are planning to become pregnant.  If you drink alcohol: ? Limit how much you use to 0-1 drink a day. ? Limit intake if you are breastfeeding.  Be aware of how much alcohol is in your drink. In the [place] one drink equals one 12 oz bottle of beer (355 mL), one 5 oz glass of wine (148 mL), or one 1 oz glass of hard liquor (44 mL). General instructions  Schedule regular health, dental, and eye exams.  Stay current with your vaccines.  Tell your health care provider if: ? You often feel depressed. ? You have ever been abused or do not feel safe at home. Summary  Adopting a healthy lifestyle and getting preventive care are important in promoting health and wellness.  Follow your  health care provider's instructions about healthy diet, exercising, and getting tested or screened for diseases.  Follow your health care provider's instructions on monitoring your cholesterol and blood pressure. This information is not intended to replace advice given to you by your health care provider. Make sure you discuss any questions you have with your health care provider. Document Revised: 05/22/2018 Document Reviewed: 05/22/2018 Elsevier Patient Education  2020 [employer].

## 2019-09-25 NOTE — Assessment & Plan Note (Signed)
Last HgA1c 6.2 with Dr. Buddy Duty and she will ask him about potential medication reduction as she has been stable with good readings for some time. Foot exam done. Eye exam with hecker up to date. Microalbumin and HgA1c at endo and they adjust her synjardy and victoza.

## 2019-09-25 NOTE — Progress Notes (Signed)
   Subjective:   Patient ID: Tabitha [name], female    DOB: December 20, 1965, 54 y.o.   MRN: [id number]  HPI The patient is a 54 YO female coming in for physical. Some other chronic concerns.  PMH, [facility], social history reviewed and updated  Review of Systems  Constitutional: Negative.   HENT: Negative.   Eyes: Negative.   Respiratory: Negative for cough, chest tightness and shortness of breath.   Cardiovascular: Negative for chest pain, palpitations and leg swelling.  Gastrointestinal: Negative for abdominal distention, abdominal pain, constipation, diarrhea, nausea and vomiting.  Musculoskeletal: Negative.   Skin: Negative.   Neurological: Negative.   Psychiatric/Behavioral: Negative.     Objective:  Physical Exam Constitutional:      Appearance: She is well-developed. She is obese.  HENT:     Head: Normocephalic and atraumatic.  Cardiovascular:     Rate and Rhythm: Normal rate and regular rhythm.  Pulmonary:     Effort: Pulmonary effort is normal. No respiratory distress.     Breath sounds: Normal breath sounds. No wheezing or rales.  Abdominal:     General: Bowel sounds are normal. There is no distension.     Palpations: Abdomen is soft.     Tenderness: There is no abdominal tenderness. There is no rebound.  Musculoskeletal:     Cervical back: Normal range of motion.  Skin:    General: Skin is warm and dry.  Neurological:     Mental Status: She is alert and oriented to person, place, and time.     Coordination: Coordination normal.     Vitals:   09/25/19 0843  BP: 116/70  Pulse: 68  Temp: 98.1 F (36.7 C)  TempSrc: Oral  SpO2: 99%  Weight: 238 lb (108 kg)  Height: 5\' 3"  (1.6 m)    This visit occurred during the SARS-CoV-2 public health emergency.  Safety protocols were in place, including screening questions prior to the visit, additional usage of staff PPE, and extensive cleaning of exam room while observing appropriate contact time as indicated for disinfecting  solutions.   Assessment & Plan:

## 2019-09-29 ENCOUNTER — Other Ambulatory Visit (HOSPITAL_COMMUNITY): Payer: Self-pay | Admitting: Rheumatology

## 2019-10-13 ENCOUNTER — Telehealth: Payer: Self-pay

## 2019-10-13 MED FILL — VICTOZA 18 MG/3 ML INJECT P: 18 | 30 days supply | Qty: 9 | Fill #1

## 2019-10-13 NOTE — Telephone Encounter (Signed)
Refill request received via fax from [facility] for cosentyx.  Patient saw [facility] on 09/29/2019. I spoke with patient and she has transferred care. Patient will have the pharmacy contact [facility] for refill.

## 2019-10-14 MED FILL — METHOTREXATE 25 MG/ML VIAL: 50 | 84 days supply | Qty: 10 | Fill #0

## 2019-10-17 ENCOUNTER — Telehealth: Payer: Self-pay | Admitting: Pharmacy Technician

## 2019-10-17 ENCOUNTER — Other Ambulatory Visit: Payer: Self-pay | Admitting: Pharmacist

## 2019-10-17 MED ORDER — COSENTYX SENSOREADY (300 MG) 150 MG/ML ~~LOC~~ SOAJ: 150.0000 mg | SUBCUTANEOUS | 5 refills | Status: DC

## 2019-10-17 NOTE — Telephone Encounter (Signed)
Receive PA request for Cosentyx form [facility].  Submitted a Prior Authorization request to [facility] for COSENTYX via Cover My Meds. Will update once we receive a response.  (Key: [id number])

## 2019-10-21 NOTE — Telephone Encounter (Signed)
Received notification from [facility] regarding a prior authorization for [place]. Authorization has been APPROVED from 10/17/2019 to 10/15/2020.   Patient transferred care to another provider.  Will no longer fill or submit PA on patient behalf.  Mariella Saa, PharmD, [place], [place] [facility] Pharmacist [phone]  10/21/2019 7:52 PM

## 2019-11-05 ENCOUNTER — Other Ambulatory Visit: Payer: Self-pay

## 2019-11-05 ENCOUNTER — Encounter: Payer: Self-pay | Admitting: Family Medicine

## 2019-11-05 ENCOUNTER — Ambulatory Visit: Payer: No Typology Code available for payment source | Admitting: Family Medicine

## 2019-11-05 VITALS — BP 102/58 | HR 83 | Ht 63.0 in | Wt 240.0 lb

## 2019-11-05 DIAGNOSIS — M999 Biomechanical lesion, unspecified: Secondary | ICD-10-CM | POA: Diagnosis not present

## 2019-11-05 DIAGNOSIS — L4050 Arthropathic psoriasis, unspecified: Secondary | ICD-10-CM

## 2019-11-05 DIAGNOSIS — M170 Bilateral primary osteoarthritis of knee: Secondary | ICD-10-CM | POA: Diagnosis not present

## 2019-11-05 NOTE — Assessment & Plan Note (Signed)
Chronic problem.  Patient is on Cosentyx at the moment.  We discussed with patient about the arthritic changes as well.  Encourage weight loss.  Continues to respond well to manipulation for some of her other aches and pains.  Follow-up again 4 to 8 weeks

## 2019-11-05 NOTE — Assessment & Plan Note (Signed)
Stable, stay active

## 2019-11-05 NOTE — Patient Instructions (Signed)
Continue to watch the knee See me again in 5 weeks

## 2019-11-05 NOTE — Assessment & Plan Note (Signed)

## 2019-11-05 NOTE — Progress Notes (Signed)
[place] Phone: [phone] Subjective:   Tabitha [name], am serving as a scribe for Dr. Hulan Saas. This visit occurred during the SARS-CoV-2 public health emergency.  Safety protocols were in place, including screening questions prior to the visit, additional usage of staff PPE, and extensive cleaning of exam room while observing appropriate contact time as indicated for disinfecting solutions.   I'm seeing this patient by the request  of:  Hoyt Koch, MD  CC: Knee pain, back pain follow-up  [id number]  Tabitha [name] is a 54 y.o. female coming in with complaint of back pain. Last seen on 09/17/2019 for OMT. Patient states that she has changed medication dosage from rheumatologist which is helping alleviate some of her pain. Neck pain stiff and painful today. Headaches over past 2 days.   Hiking this weekend exacerbated left knee pain.  Patient has known arthritic changes of the knees.  Patient states had significant swelling and decreased range of motion initially and does seem to be improving a little bit at this time.         Past Medical History:  Diagnosis Date  . Arthritis   . De Quervain's disease (tenosynovitis)   . Dermatophytosis, scalp   . Diabetes mellitus   . Hypercholesteremia   . Obesity   . Polycystic ovarian disease   . Psoriatic arthritis ([place])   . Wears glasses    Past Surgical History:  Procedure Laterality Date  . BREAST BIOPSY Right   . CARPAL TUNNEL RELEASE    . COLONOSCOPY    . DE QUERVAIN'S RELEASE  2012   left  . KNEE ARTHROPLASTY    . KNEE ARTHROSCOPY Right 05/06/2013   Procedure: RIGHT KNEE ARTHROSCOPY ;  Surgeon: Hessie Dibble, MD;  Location: [facility];  Service: Orthopedics;  Laterality: Right;  partial lateral minisectomy and chondroplasty  . WISDOM TOOTH EXTRACTION    . WRIST SURGERY  2001   carpel tunnel -rt   Social History    Socioeconomic History  . Marital status: Legally Separated    Spouse name: Not on file  . Number of children: Not on file  . Years of education: Not on file  . Highest education level: Not on file  Occupational History  . 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 Smoking status: Former Smoker    Packs/day: 1.30    Years: 12.00    Pack years: 15.60    Types: Cigarettes    Start date: 06/12/1984    Quit date: 03/18/1995    Years since quitting: 24.6  . Smokeless tobacco: Never Used  . Tobacco comment: Not interested in returning to smoking.   Substance and Sexual Activity  . Alcohol use: Yes    Comment: rarely  . Drug use: [name]  . Sexual activity: Yes    Partners: Male    Comment: married  Other Topics Concern  . Not on file  Social History Narrative  . Not on file   Social Determinants of Health   Financial Resource Strain:   . Difficulty of Paying Living Expenses:   Food Insecurity:   . Worried About Charity fundraiser in the Last Year:   . Arboriculturist in the Last Year:   Transportation Needs:   . Film/video editor (Medical):   Marland Kitchen Lack of Transportation (Non-Medical):  Physical Activity:   . Days of Exercise per Week:   . Minutes of Exercise per Session:   Stress:   . Feeling of Stress :   Social Connections:   . Frequency of Communication with Friends and Family:   . Frequency of Social Gatherings with Friends and Family:   . Attends Religious Services:   . Active Member of Clubs or Organizations:   . Attends Archivist Meetings:   Marland Kitchen Marital Status:    Allergies  Allergen Reactions  . Chocolate Anaphylaxis  . Food     chocolate  . Statins     Intolerant  lft elevation  . Latex Rash   Family History  Problem Relation Age of Onset  . Diabetes Father   . Hypertension Father   . Stroke Father   . Kidney disease Father   . Heart disease Father   . Cancer Mother 28       breast ca  died at age 82  . Breast cancer Mother 34  . Hypertension Other   . Diabetes type II Other   . Obesity Other   . Obesity Brother   . Hypertension Brother   . Heart disease Maternal Grandmother   . Alzheimer's disease Maternal Grandmother   . Diabetes Paternal Grandmother   . Heart disease Paternal Grandfather     Current Outpatient Medications (Endocrine & Metabolic):  .  SYNJARDY XR 03-999 MG TB24, daily. Marland Kitchen  VICTOZA 18 MG/3ML SOPN, INJECT 1.8 MG INTO THE SKIN AS DIRECTED DAILY. (Patient taking differently: 1.2 mg. )    Current Outpatient Medications (Analgesics):  Marland Kitchen  Acetaminophen (TYLENOL PO), Take by mouth as needed. Marland Kitchen  aspirin EC 81 MG tablet, Take 81 mg by mouth daily.  Current Outpatient Medications (Hematological):  .  folic acid (FOLVITE) 1 MG tablet, TAKE 1 TABLET BY MOUTH TWO TIMES DAILY  Current Outpatient Medications (Other):  Marland Kitchen  ACCU-CHEK GUIDE test strip,  .  cholecalciferol (VITAMIN D3) 25 MCG (1000 UT) tablet, Take 1,000 Units by mouth daily. .  clobetasol cream (TEMOVATE) [id number] %, Apply 1 application topically 2 (two) times daily. As directed; never apply to face. .  desonide (DESOWEN) 0.05 % cream, Apply topically 2 (two) times daily as needed. .  Diclofenac Sodium (PENNSAID) 2 % SOLN, Place 2 application onto the skin 2 (two) times daily. .  methotrexate 50 MG/2ML injection, INJECT 0.6 ML UNDER THE SKIN WEEKLY .  Omega-3 Fatty Acids (FISH OIL) 1200 MG CAPS, Take 1-2 capsules (1,200-2,400 mg total) by mouth 2 (two) times daily. Takes 2400 mg qam and 1200 mg qpm .  Secukinumab, 300 MG Dose, (COSENTYX SENSOREADY, 300 MG,) 150 MG/ML SOAJ, Inject 150 mg into the skin every 14 (fourteen) days. .  TURMERIC PO, Take by mouth 2 (two) times daily.  Marland Kitchen  UNIFINE PENTIPS 32G X 4 MM MISC,    Reviewed prior external information including notes and imaging from  primary care provider As well as notes that were available from care everywhere and other healthcare  systems.  Past medical history, social, surgical and family history all reviewed in electronic medical record.  [name] pertanent information unless stated regarding to the chief complaint.   Review of Systems:  [name] visual changes, nausea, vomiting, diarrhea, constipation, dizziness, abdominal pain, skin rash, fevers, chills, night sweats, weight loss, swollen lymph nodes,  joint swelling, chest pain, shortness of breath, mood changes. POSITIVE muscle aches, headache, body aches  Objective  Blood pressure (!) 102/58, pulse  83, height 5\' 3"  (1.6 m), weight 240 lb (108.9 kg).   General: [name] apparent distress alert and oriented x3 mood and affect normal, dressed appropriately.  HEENT: Pupils equal, extraocular movements intact  Respiratory: Patient's speak in full sentences and does not appear short of breath  Cardiovascular: [name] lower extremity edema, non tender, [name] erythema  Neuro: Cranial nerves II through XII are intact, neurovascularly intact in all extremities with 2+ DTRs and 2+ pulses.  Gait normal with good balance and coordination.  MSK: Back exam shows the patient does have poor core strength.  Tender to palpation in the paraspinal musculature mostly of the thoracolumbar juncture as well as the lumbosacral areas.  Tightness with Corky Sox bilaterally.  Difficulty with straight leg test but nothing that causes radicular symptoms.    Left knee severe arthritic changes noted.  Instability with valgus and varus force.  Patient does have some positive grind.  Trace effusion noted which is worse than previous exam.  Osteopathic findings  C4 flexed rotated and side bent left C6 flexed rotated and side bent left T4 extended rotated and side bent right inhaled rib T6 extended rotated and side bent left L2 flexed rotated and side bent right Sacrum right on right     Impression and Recommendations:     This case required medical decision making of moderate complexity. The above documentation has  been reviewed and is accurate and complete Lyndal Pulley, DO       Note: This dictation was prepared with Dragon dictation along with smaller phrase technology. Any transcriptional errors that result from this process are unintentional.

## 2019-11-12 ENCOUNTER — Other Ambulatory Visit (HOSPITAL_COMMUNITY): Payer: Self-pay | Admitting: Internal Medicine

## 2019-11-12 MED FILL — SYNJARDY XR 10-1000 MG TB24: 10-1000 | 90 days supply | Qty: 90 | Fill #1

## 2019-11-12 MED FILL — UNIFINE PENTIPS 31GX3/16: 31G X 5 MM | 90 days supply | Qty: 100 | Fill #0

## 2019-12-05 MED FILL — VICTOZA 18 MG/3 ML INJECT P: 18 | 30 days supply | Qty: 9 | Fill #2

## 2019-12-05 MED FILL — FREESTYLE LITE TEST STRIP: 90 days supply | Qty: 300 | Fill #1

## 2019-12-11 ENCOUNTER — Other Ambulatory Visit: Payer: Self-pay

## 2019-12-11 ENCOUNTER — Encounter: Payer: Self-pay | Admitting: Family Medicine

## 2019-12-11 ENCOUNTER — Ambulatory Visit: Payer: No Typology Code available for payment source | Admitting: Family Medicine

## 2019-12-11 VITALS — BP 110/80 | HR 71 | Ht 63.0 in | Wt 238.0 lb

## 2019-12-11 DIAGNOSIS — M19011 Primary osteoarthritis, right shoulder: Secondary | ICD-10-CM | POA: Insufficient documentation

## 2019-12-11 DIAGNOSIS — M19012 Primary osteoarthritis, left shoulder: Secondary | ICD-10-CM | POA: Diagnosis not present

## 2019-12-11 DIAGNOSIS — L4050 Arthropathic psoriasis, unspecified: Secondary | ICD-10-CM

## 2019-12-11 DIAGNOSIS — M999 Biomechanical lesion, unspecified: Secondary | ICD-10-CM | POA: Diagnosis not present

## 2019-12-11 NOTE — Assessment & Plan Note (Signed)
Pain noted.  Discussed which activities to do which also important.  Patient was given home exercise.  Topical anti-inflammatories, icing regimen.  Follow-up again in 4 weeks

## 2019-12-11 NOTE — Progress Notes (Signed)
[facility] [address] [place] Phone: [phone] Subjective:   I Tabitha [name] am serving as a Education administrator for Dr. Hulan Saas.  This visit occurred during the SARS-CoV-2 public health emergency.  Safety protocols were in place, including screening questions prior to the visit, additional usage of staff PPE, and extensive cleaning of exam room while observing appropriate contact time as indicated for disinfecting solutions.   I'm seeing this patient by the request  of:  Hoyt Koch, MD  CC: Back pain follow-up  [id number]  Tabitha [name] is a 54 y.o. female coming in with complaint of back pain. Last seen 11/05/2019 for OMT. Left shoulder pain as well. Patient states her back is doing ok for the most part. States her shoulder pops. Posterior shoulder pain. Doesn't remember an injury but states she has been doing more exercise with her arms lately.  Left shoulder, worse when she lifts it above her head.  Nothing severe though.    Past Medical History:  Diagnosis Date  . Arthritis   . De Quervain's disease (tenosynovitis)   . Dermatophytosis, scalp   . Diabetes mellitus   . Hypercholesteremia   . Obesity   . Polycystic ovarian disease   . Psoriatic arthritis ([place])   . Wears glasses    Past Surgical History:  Procedure Laterality Date  . BREAST BIOPSY Right   . CARPAL TUNNEL RELEASE    . COLONOSCOPY    . DE QUERVAIN'S RELEASE  2012   left  . KNEE ARTHROPLASTY    . KNEE ARTHROSCOPY Right 05/06/2013   Procedure: RIGHT KNEE ARTHROSCOPY ;  Surgeon: Hessie Dibble, MD;  Location: [facility];  Service: Orthopedics;  Laterality: Right;  partial lateral minisectomy and chondroplasty  . WISDOM TOOTH EXTRACTION    . WRIST SURGERY  2001   carpel tunnel -rt   Social History   Socioeconomic History  . Marital status: Legally Separated    Spouse name: Not on file  . Number of children: Not on file  . Years of  education: Not on file  . Highest education level: Not on file  Occupational History  . 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 Smoking status: Former Smoker    Packs/day: 1.30    Years: 12.00    Pack years: 15.60    Types: Cigarettes    Start date: 06/12/1984    Quit date: 03/18/1995    Years since quitting: 24.7  . Smokeless tobacco: Never Used  . Tobacco comment: Not interested in returning to smoking.   Vaping Use  . Vaping Use: Never used  Substance and Sexual Activity  . Alcohol use: Yes    Comment: rarely  . Drug use: No  . Sexual activity: Yes    Partners: Male    Comment: married  Other Topics Concern  . Not on file  Social History Narrative  . Not on file   Social Determinants of Health   Financial Resource Strain:   . Difficulty of Paying Living Expenses:   Food Insecurity:   . Worried About Charity fundraiser in the Last Year:   . Arboriculturist in the Last Year:   Transportation Needs:   . Film/video editor (Medical):   Marland Kitchen Lack of Transportation (Non-Medical):   Physical Activity:   . Days of Exercise per Week:   .  Minutes of Exercise per Session:   Stress:   . Feeling of Stress :   Social Connections:   . Frequency of Communication with Friends and Family:   . Frequency of Social Gatherings with Friends and Family:   . Attends Religious Services:   . Active Member of Clubs or Organizations:   . Attends Archivist Meetings:   Marland Kitchen Marital Status:    Allergies  Allergen Reactions  . Chocolate Anaphylaxis  . Food     chocolate  . Statins     Intolerant  lft elevation  . Latex Rash   Family History  Problem Relation Age of Onset  . Diabetes Father   . Hypertension Father   . Stroke Father   . Kidney disease Father   . Heart disease Father   . Cancer Mother 41       breast ca died at age 29  . Breast cancer Mother 78  . Hypertension Other   . Diabetes type II Other   .  Obesity Other   . Obesity Brother   . Hypertension Brother   . Heart disease Maternal Grandmother   . Alzheimer's disease Maternal Grandmother   . Diabetes Paternal Grandmother   . Heart disease Paternal Grandfather     Current Outpatient Medications (Endocrine & Metabolic):  .  SYNJARDY XR 03-999 MG TB24, daily. Marland Kitchen  VICTOZA 18 MG/3ML SOPN, INJECT 1.8 MG INTO THE SKIN AS DIRECTED DAILY. (Patient taking differently: 1.2 mg. )    Current Outpatient Medications (Analgesics):  Marland Kitchen  Acetaminophen (TYLENOL PO), Take by mouth as needed. Marland Kitchen  aspirin EC 81 MG tablet, Take 81 mg by mouth daily.  Current Outpatient Medications (Hematological):  .  folic acid (FOLVITE) 1 MG tablet, TAKE 1 TABLET BY MOUTH TWO TIMES DAILY  Current Outpatient Medications (Other):  Marland Kitchen  ACCU-CHEK GUIDE test strip,  .  cholecalciferol (VITAMIN D3) 25 MCG (1000 UT) tablet, Take 1,000 Units by mouth daily. .  clobetasol cream (TEMOVATE) 6.60 %, Apply 1 application topically 2 (two) times daily. As directed; never apply to face. .  desonide (DESOWEN) 0.05 % cream, Apply topically 2 (two) times daily as needed. .  Diclofenac Sodium (PENNSAID) 2 % SOLN, Place 2 application onto the skin 2 (two) times daily. .  Omega-3 Fatty Acids (FISH OIL) 1200 MG CAPS, Take 1-2 capsules (1,200-2,400 mg total) by mouth 2 (two) times daily. Takes 2400 mg qam and 1200 mg qpm .  Secukinumab, 300 MG Dose, (COSENTYX SENSOREADY, 300 MG,) 150 MG/ML SOAJ, Inject 150 mg into the skin every 14 (fourteen) days. .  TURMERIC PO, Take by mouth 2 (two) times daily.  Marland Kitchen  UNIFINE PENTIPS 32G X 4 MM MISC,    Reviewed prior external information including notes and imaging from  primary care provider As well as notes that were available from care everywhere and other healthcare systems.  Past medical history, social, surgical and family history all reviewed in electronic medical record.  No pertanent information unless stated regarding to the chief  complaint.   Review of Systems:  No headache, visual changes, nausea, vomiting, diarrhea, constipation, dizziness, abdominal pain, skin rash, fevers, chills, night sweats, weight loss, swollen lymph nodes, body aches, joint swelling, chest pain, shortness of breath, mood changes. POSITIVE muscle aches  Objective  Blood pressure 110/80, pulse 71, height 5\' 3"  (1.6 m), weight 238 lb (108 kg), SpO2 98 %.   General: No apparent distress alert and oriented x3 mood and affect normal,  dressed appropriately.  HEENT: Pupils equal, extraocular movements intact  Respiratory: Patient's speak in full sentences and does not appear short of breath  Cardiovascular: No lower extremity edema, non tender, no erythema  Neuro: Cranial nerves II through XII are intact, neurovascularly intact in all extremities with 2+ DTRs and 2+ pulses.  Gait mild antalgic MSK: Arthritic changes of multiple joints Left shoulder exam shows the patient does have some positive impingement noted.  Positive crossover.  Pain over the acromioclavicular joint noted.  Mild impingement with Hawkins as well.   Low back exam does have some tightness noted diffusely.  Patient does have some positive Corky Sox which is more than she has had in the last couple times.  Patient has negative straight leg test.  Upper back increasing tightness of the left trapezius.  Osteopathic findings  C6 flexed rotated and side bent left T7 extended rotated and side bent left inhaled rib L5 flexed rotated and side bent left Sacrum right on right    Impression and Recommendations:     The above documentation has been reviewed and is accurate and complete Lyndal Pulley, DO       Note: This dictation was prepared with Dragon dictation along with smaller phrase technology. Any transcriptional errors that result from this process are unintentional.

## 2019-12-11 NOTE — Assessment & Plan Note (Signed)

## 2019-12-11 NOTE — Assessment & Plan Note (Signed)
May have increased patient's methotrexate recently.  Need to continue to monitor.  Patient is getting regular laboratory work-up.

## 2019-12-11 NOTE — Patient Instructions (Addendum)
Good to see you Keep hands in peripheral vision Exercise 3 times a week Pennsaid for the shoulder See me again in 6 weeks

## 2019-12-17 MED FILL — COSENTYX 300 MG DOSE-2 PENS: 150 | 28 days supply | Qty: 2 | Fill #2

## 2019-12-26 MED FILL — METHOTREXATE 25 MG/ML VIAL: 50 | 84 days supply | Qty: 12 | Fill #0

## 2020-01-06 MED FILL — METHOTREXATE 25 MG/ML VIAL: 50 | 84 days supply | Qty: 12 | Fill #0

## 2020-01-07 MED FILL — COSENTYX 300 MG DOSE-2 PENS: 150 | 28 days supply | Qty: 2 | Fill #3

## 2020-01-09 MED FILL — VICTOZA 18 MG/3 ML INJECT P: 18 | 30 days supply | Qty: 9 | Fill #3

## 2020-01-15 MED FILL — COSENTYX 300 MG DOSE-2 PENS: 150 | 28 days supply | Qty: 2 | Fill #3

## 2020-01-23 ENCOUNTER — Ambulatory Visit: Payer: No Typology Code available for payment source | Admitting: Physician Assistant

## 2020-01-27 ENCOUNTER — Encounter: Payer: Self-pay | Admitting: Family Medicine

## 2020-01-27 ENCOUNTER — Ambulatory Visit: Payer: No Typology Code available for payment source | Admitting: Family Medicine

## 2020-01-27 ENCOUNTER — Other Ambulatory Visit: Payer: Self-pay

## 2020-01-27 VITALS — BP 100/82 | HR 72 | Ht 63.0 in | Wt 237.0 lb

## 2020-01-27 DIAGNOSIS — M999 Biomechanical lesion, unspecified: Secondary | ICD-10-CM | POA: Diagnosis not present

## 2020-01-27 DIAGNOSIS — L4050 Arthropathic psoriasis, unspecified: Secondary | ICD-10-CM

## 2020-01-27 NOTE — Progress Notes (Signed)
[facility] [address] [place] Phone: [phone] Subjective:   I [name] am serving as a Education administrator for Dr. Hulan Saas.  This visit occurred during the SARS-CoV-2 public health emergency.  Safety protocols were in place, including screening questions prior to the visit, additional usage of staff PPE, and extensive cleaning of exam room while observing appropriate contact time as indicated for disinfecting solutions.   I'm seeing this patient by the request  of:  Hoyt Koch, MD  CC: Neck and back pain follow-up  [id number]  Tabitha [name] is a 54 y.o. female coming in with complaint of back and neck pain Patient states she is a little stiff today but not as bad as she has been.  Medications patient has been prescribed: Topical anti-inflammatories  Taking: Yes         Reviewed prior external information including notes and imaging from previsou exam, outside providers and external EMR if available.   As well as notes that were available from care everywhere and other healthcare systems.  Past medical history, social, surgical and family history all reviewed in electronic medical record.  No pertanent information unless stated regarding to the chief complaint.   Past Medical History:  Diagnosis Date  . Arthritis   . De Quervain's disease (tenosynovitis)   . Dermatophytosis, scalp   . Diabetes mellitus   . Hypercholesteremia   . Obesity   . Polycystic ovarian disease   . Psoriatic arthritis ([place])   . Wears glasses     Allergies  Allergen Reactions  . Chocolate Anaphylaxis  . Food     chocolate  . Statins     Intolerant  lft elevation  . Latex Rash     Review of Systems:  No headache, visual changes, nausea, vomiting, diarrhea, constipation, dizziness, abdominal pain, skin rash, fevers, chills, night sweats, weight loss, swollen lymph nodes, , chest pain, shortness of breath, mood changes. POSITIVE  muscle aches, body aches, joint swelling  Objective  Blood pressure 100/82, pulse 72, height 5\' 3"  (1.6 m), weight 237 lb (107.5 kg), SpO2 97 %.   General: No apparent distress alert and oriented x3 mood and affect normal, dressed appropriately.  Overweight HEENT: Pupils equal, extraocular movements intact  Respiratory: Patient's speak in full sentences and does not appear short of breath  Cardiovascular: No lower extremity edema, non tender, no erythema  Neuro: Cranial nerves II through XII are intact, neurovascularly intact in all extremities with 2+ DTRs and 2+ pulses.  Gait normal with good balance and coordination.  MSK: Trace effusion of the knees bilaterally Back -low back does have tenderness to palpation in the paraspinal musculature lumbar spine right greater than left.  Osteopathic findings  C2 flexed rotated and side bent right C7 flexed rotated and side bent left T3 extended rotated and side bent right inhaled rib T9 extended rotated and side bent left L4 flexed rotated and side bent left  Sacrum right on right       Assessment and Plan:  Psoriatic arthritis ([place]) Patient is likely having some mild flare at this moment.  Is going to be following up with her rheumatologist.  Likely mild exacerbation of a chronic problem.  Has responded well though to manipulation.  Patient will increase activity slowly.  Follow-up again in 4 to 8 weeks    Nonallopathic problems  Decision today to treat with OMT was based on Physical Exam  After verbal consent patient was  treated with HVLA, ME, FPR techniques in cervical, rib, thoracic, lumbar, and sacral  areas  Patient tolerated the procedure well with improvement in symptoms  Patient given exercises, stretches and lifestyle modifications  See medications in patient instructions if given  Patient will follow up in 4-8 weeks      The above documentation has been reviewed and is accurate and complete Lyndal Pulley,  DO       Note: This dictation was prepared with Dragon dictation along with smaller phrase technology. Any transcriptional errors that result from this process are unintentional.

## 2020-01-27 NOTE — Patient Instructions (Addendum)
Good to see you See me again in 6-8 weeks 

## 2020-01-27 NOTE — Assessment & Plan Note (Signed)
Patient is likely having some mild flare at this moment.  Is going to be following up with her rheumatologist.  Likely mild exacerbation of a chronic problem.  Has responded well though to manipulation.  Patient will increase activity slowly.  Follow-up again in 4 to 8 weeks

## 2020-02-09 MED FILL — FOLIC ACID 1 MG TABS: 1 | 90 days supply | Qty: 180 | Fill #2

## 2020-02-09 MED FILL — SYNJARDY XR 10-1000 MG TB24: 10-1000 | 90 days supply | Qty: 90 | Fill #2

## 2020-02-11 MED FILL — COSENTYX 300 MG DOSE-2 PENS: 150 | 28 days supply | Qty: 2 | Fill #4

## 2020-02-13 ENCOUNTER — Encounter: Payer: Self-pay | Admitting: Internal Medicine

## 2020-02-17 MED FILL — VICTOZA 18 MG/3 ML INJECT P: 18 | 30 days supply | Qty: 9 | Fill #4

## 2020-02-19 MED FILL — UNIFINE PENTIPS 31GX3/16: 31G X 5 MM | 90 days supply | Qty: 100 | Fill #1

## 2020-02-26 ENCOUNTER — Other Ambulatory Visit (HOSPITAL_COMMUNITY): Payer: Self-pay | Admitting: Rheumatology

## 2020-02-26 MED FILL — CLOBETASOL PROPIONATE 0.05: 0.05 | 30 days supply | Qty: 30 | Fill #0

## 2020-03-08 MED FILL — CLOBETASOL PROPIONATE 0.05: 0.05 | 30 days supply | Qty: 30 | Fill #0

## 2020-03-10 MED FILL — COSENTYX 300 MG DOSE-2 PENS: 150 | 28 days supply | Qty: 2 | Fill #5

## 2020-03-11 NOTE — Progress Notes (Signed)
[place] Phone: [phone] Subjective:   [name], am serving as a scribe for Dr. Hulan Saas. This visit occurred during the SARS-CoV-2 public health emergency.  Safety protocols were in place, including screening questions prior to the visit, additional usage of staff PPE, and extensive cleaning of exam room while observing appropriate contact time as indicated for disinfecting solutions.   I'm seeing this patient by the request  of:  Hoyt Koch, MD  CC: back and neck pain f/u   [id number]  [name] is a 54 y.o. female coming in with complaint of back and neck pain. OMT 01/27/2020. Patient states that continues to have some mild discomfort and pain.  Has been packing a more regular basis.  Knee seem to be doing relatively well  Medications patient has been prescribed: Pennsaid  Taking:         Reviewed prior external information including notes and imaging from previsou exam, outside providers and external EMR if available.   As well as notes that were available from care everywhere and other healthcare systems.  Past medical history, social, surgical and family history all reviewed in electronic medical record.  No pertanent information unless stated regarding to the chief complaint.   Past Medical History:  Diagnosis Date  . Arthritis   . De Quervain's disease (tenosynovitis)   . Dermatophytosis, scalp   . Diabetes mellitus   . Hypercholesteremia   . Obesity   . Polycystic ovarian disease   . Psoriatic arthritis ([place])   . Wears glasses     Allergies  Allergen Reactions  . Chocolate Anaphylaxis  . Food     chocolate  . Statins     Intolerant  lft elevation  . Latex Rash     Review of Systems:  No headache, visual changes, nausea, vomiting, diarrhea, constipation, dizziness, abdominal pain, skin rash, fevers, chills, night sweats, weight loss, swollen lymph  nodes, body aches, joint swelling, chest pain, shortness of breath, mood changes. POSITIVE muscle aches  Objective  There were no vitals taken for this visit.   General: No apparent distress alert and oriented x3 mood and affect normal, dressed appropriately.  HEENT: Pupils equal, extraocular movements intact  Respiratory: Patient's speak in full sentences and does not appear short of breath  Cardiovascular: No lower extremity edema, non tender, no erythema  Neuro: Cranial nerves II through XII are intact, neurovascularly intact in all extremities with 2+ DTRs and 2+ pulses.  Gait normal with good balance and coordination.  MSK:    Back -back exam shows the patient does have some tenderness to palpation diffusely across the lumbar spine.  No spinous process tenderness.  Tightness with FABER test.  Mild improvement in core strength  Osteopathic findings  C2 flexed rotated and side bent right C6 flexed rotated and side bent left T3 extended rotated and side bent right inhaled rib T9 extended rotated and side bent left L2 flexed rotated and side bent right Sacrum right on right       Assessment and Plan:  Psoriatic arthritis ([place]) Still likely contributing.  Patient has made some progress overall though.  Patient has been very active.  Encouraged her to continue to do so.  Patient will have no significant changes in medications.  Follow-up with me again 4 to 6 weeks    Nonallopathic problems  Decision today to treat with OMT was based on Physical Exam  After  verbal consent patient was treated with HVLA, ME, FPR techniques in cervical, rib, thoracic, lumbar, and sacral  areas  Patient tolerated the procedure well with improvement in symptoms  Patient given exercises, stretches and lifestyle modifications  See medications in patient instructions if given  Patient will follow up in 4-6 weeks      The above documentation has been reviewed and is accurate and complete  Lyndal Pulley, DO       Note: This dictation was prepared with Dragon dictation along with smaller phrase technology. Any transcriptional errors that result from this process are unintentional.

## 2020-03-12 ENCOUNTER — Ambulatory Visit (INDEPENDENT_AMBULATORY_CARE_PROVIDER_SITE_OTHER): Payer: No Typology Code available for payment source | Admitting: Family Medicine

## 2020-03-12 ENCOUNTER — Other Ambulatory Visit: Payer: Self-pay

## 2020-03-12 ENCOUNTER — Encounter: Payer: Self-pay | Admitting: Family Medicine

## 2020-03-12 VITALS — BP 132/84 | HR 75 | Ht 63.0 in | Wt 238.0 lb

## 2020-03-12 DIAGNOSIS — L4050 Arthropathic psoriasis, unspecified: Secondary | ICD-10-CM

## 2020-03-12 DIAGNOSIS — M999 Biomechanical lesion, unspecified: Secondary | ICD-10-CM | POA: Diagnosis not present

## 2020-03-12 NOTE — Patient Instructions (Signed)
Try thoracic spine rotation after packing See me in 6 weeks

## 2020-03-12 NOTE — Assessment & Plan Note (Signed)
Still likely contributing.  Patient has made some progress overall though.  Patient has been very active.  Encouraged her to continue to do so.  Patient will have no significant changes in medications.  Follow-up with me again 4 to 6 weeks

## 2020-03-26 MED FILL — VICTOZA 18 MG/3 ML INJECT P: 18 | 30 days supply | Qty: 9 | Fill #5

## 2020-04-01 ENCOUNTER — Other Ambulatory Visit: Payer: Self-pay | Admitting: Pharmacist

## 2020-04-01 MED ORDER — COSENTYX SENSOREADY (300 MG) 150 MG/ML ~~LOC~~ SOAJ: 150.0000 mg | SUBCUTANEOUS | 5 refills | Status: DC

## 2020-04-01 MED FILL — METHOTREXATE 25 MG/ML VIAL: 50 | 84 days supply | Qty: 10 | Fill #1

## 2020-04-07 MED FILL — COSENTYX 300 MG DOSE-2 PENS: 150 | 28 days supply | Qty: 2 | Fill #0

## 2020-04-14 ENCOUNTER — Encounter: Payer: Self-pay | Admitting: Internal Medicine

## 2020-04-23 ENCOUNTER — Encounter: Payer: Self-pay | Admitting: Internal Medicine

## 2020-04-23 ENCOUNTER — Ambulatory Visit (INDEPENDENT_AMBULATORY_CARE_PROVIDER_SITE_OTHER): Payer: No Typology Code available for payment source | Admitting: Internal Medicine

## 2020-04-23 ENCOUNTER — Other Ambulatory Visit: Payer: Self-pay

## 2020-04-23 VITALS — BP 116/74 | HR 71 | Temp 98.1°F | Ht 63.0 in | Wt 238.0 lb

## 2020-04-23 DIAGNOSIS — Z23 Encounter for immunization: Secondary | ICD-10-CM | POA: Diagnosis not present

## 2020-04-23 DIAGNOSIS — J349 Unspecified disorder of nose and nasal sinuses: Secondary | ICD-10-CM | POA: Diagnosis not present

## 2020-04-23 NOTE — Assessment & Plan Note (Signed)
Referral to ENT for consideration of sinus procedure to help with chronic sinus issues.

## 2020-04-23 NOTE — Patient Instructions (Signed)
We will get you in with ENT to talk about the sinuses.

## 2020-04-23 NOTE — Progress Notes (Signed)
   Subjective:   Patient ID: Tabitha [name], female    DOB: 1966-04-22, 54 y.o.   MRN: [id number]  HPI The patient is a 54 YO female coming in for sinus issues causing snoring. She does have small nose passageways and any little congestion causes mouth breathing at night time. Over the last few months this has gotten worse and she is snoring consistently. She has recorded sound of herself and does not hear any apnea episodes. Denies waking up tired or drowsy during the day. No morning headaches. Prior sinus procedure and wishes to return to ENT. Uses flonase and affrin with minimal relief.   Review of Systems  Constitutional: Negative.   HENT: Positive for congestion.   Eyes: Negative.   Respiratory: Negative for cough, chest tightness and shortness of breath.        Snoring  Cardiovascular: Negative for chest pain, palpitations and leg swelling.  Gastrointestinal: Negative for abdominal distention, abdominal pain, constipation, diarrhea, nausea and vomiting.  Musculoskeletal: Negative.   Skin: Negative.   Neurological: Negative.   Psychiatric/Behavioral: Negative.     Objective:  Physical Exam Constitutional:      Appearance: She is well-developed.  HENT:     Head: Normocephalic and atraumatic.     Right Ear: Tympanic membrane normal.     Left Ear: Tympanic membrane normal.     Nose: Nose normal.     Mouth/Throat:     Mouth: Mucous membranes are moist.  Cardiovascular:     Rate and Rhythm: Normal rate and regular rhythm.  Pulmonary:     Effort: Pulmonary effort is normal. No respiratory distress.     Breath sounds: Normal breath sounds. No wheezing or rales.  Abdominal:     General: Bowel sounds are normal. There is no distension.     Palpations: Abdomen is soft.     Tenderness: There is no abdominal tenderness. There is no rebound.  Musculoskeletal:     Cervical back: Normal range of motion.  Skin:    General: Skin is warm and dry.  Neurological:     Mental Status: She is  alert and oriented to person, place, and time.     Coordination: Coordination normal.     Vitals:   04/23/20 1533  BP: 116/74  Pulse: 71  Temp: 98.1 F (36.7 C)  TempSrc: Oral  SpO2: 98%  Weight: 238 lb (108 kg)  Height: 5\' 3"  (1.6 m)    This visit occurred during the SARS-CoV-2 public health emergency.  Safety protocols were in place, including screening questions prior to the visit, additional usage of staff PPE, and extensive cleaning of exam room while observing appropriate contact time as indicated for disinfecting solutions.   Assessment & Plan:  Flu shot given at visit

## 2020-04-25 NOTE — Progress Notes (Signed)
[place] [address] [place] Phone: [phone] Subjective:   I Tabitha [name] am serving as a Education administrator for Dr. Hulan Saas.  This visit occurred during the SARS-CoV-2 public health emergency.  Safety protocols were in place, including screening questions prior to the visit, additional usage of staff PPE, and extensive cleaning of exam room while observing appropriate contact time as indicated for disinfecting solutions.   I'm seeing this patient by the request  of:  Hoyt Koch, MD  CC: Low back pain and allover pain follow-up  [id number]  Tabitha [name] is a 54 y.o. female coming in with complaint of back and neck pain. OMT 03/12/2020. Patient states she is stiff and her arthritis has flared up. States everything hurts today.  Think she is having a flare.  The patient did recently moved.  Also her stepmother has died recently, also finalized her divorce.  Lots of stress is likely contributing to things.  Feels like she is having a flare of her psoriatic arthritis  Medications patient has been prescribed: Pennsaid  Taking:yes          Reviewed prior external information including notes and imaging from previsou exam, outside providers and external EMR if available.   As well as notes that were available from care everywhere and other healthcare systems.  Past medical history, social, surgical and family history all reviewed in electronic medical record.  No pertanent information unless stated regarding to the chief complaint.   Past Medical History:  Diagnosis Date  . Arthritis   . De Quervain's disease (tenosynovitis)   . Dermatophytosis, scalp   . Diabetes mellitus   . Hypercholesteremia   . Obesity   . Polycystic ovarian disease   . Psoriatic arthritis ([place])   . Wears glasses     Allergies  Allergen Reactions  . Chocolate Anaphylaxis  . Food     chocolate  . Statins     Intolerant  lft elevation  .  Latex Rash     Review of Systems:  No headache, visual changes, nausea, vomiting, diarrhea, constipation, dizziness, abdominal pain, skin rash, fevers, chills, night sweats, weight loss, swollen lymph nodes, body aches, joint swelling, chest pain, shortness of breath, mood changes. POSITIVE muscle aches  Objective  Blood pressure 110/80, pulse 67, height 5\' 3"  (1.6 m), weight 236 lb (107 kg), SpO2 99 %.   General: No apparent distress alert and oriented x3 mood and affect normal, dressed appropriately.  HEENT: Pupils equal, extraocular movements intact  Respiratory: Patient's speak in full sentences and does not appear short of breath  Cardiovascular: No lower extremity edema, non tender, no erythema  Gait normal with good balance and coordination.  MSK: Small effusion of the hands and knees noted today. Back -low back exam shows the patient does have some loss of lordosis.  Tightness more than usual.  Patient has tenderness throughout.  Neurovascular intact distally.  Osteopathic findings  C6 flexed rotated and side bent left T3 extended rotated and side bent right inhaled rib T8 extended rotated and side bent left L2 flexed rotated and side bent right Sacrum right on right       Assessment and Plan:  Psoriatic arthritis ([place]) Patient is likely having a flare.  Given prednisone.  Exacerbation of this chronic problem.  Toradol given today.  Discussed icing regimen.  Increase activity as tolerated.  Follow-up with me again in 4 weeks or will call sooner if continuing to  have trouble    Nonallopathic problems  Decision today to treat with OMT was based on Physical Exam  After verbal consent patient was treated with HVLA, ME, FPR techniques in cervical, rib, thoracic, lumbar, and sacral  areas  Patient tolerated the procedure well with improvement in symptoms  Patient given exercises, stretches and lifestyle modifications  See medications in patient instructions if given   Patient will follow up in 4-8 weeks      The above documentation has been reviewed and is accurate and complete Lyndal Pulley, DO       Note: This dictation was prepared with Dragon dictation along with smaller phrase technology. Any transcriptional errors that result from this process are unintentional.

## 2020-04-26 ENCOUNTER — Encounter: Payer: Self-pay | Admitting: Family Medicine

## 2020-04-26 ENCOUNTER — Other Ambulatory Visit: Payer: Self-pay

## 2020-04-26 ENCOUNTER — Ambulatory Visit: Payer: No Typology Code available for payment source | Admitting: Family Medicine

## 2020-04-26 ENCOUNTER — Other Ambulatory Visit: Payer: Self-pay | Admitting: Family Medicine

## 2020-04-26 VITALS — BP 110/80 | HR 67 | Ht 63.0 in | Wt 236.0 lb

## 2020-04-26 DIAGNOSIS — L4050 Arthropathic psoriasis, unspecified: Secondary | ICD-10-CM

## 2020-04-26 DIAGNOSIS — M999 Biomechanical lesion, unspecified: Secondary | ICD-10-CM | POA: Diagnosis not present

## 2020-04-26 MED ORDER — PREDNISONE 20 MG PO TABS: 40.0000 mg | ORAL_TABLET | Freq: Two times a day (BID) | ORAL | 0 refills | Status: DC

## 2020-04-26 MED ORDER — KETOROLAC TROMETHAMINE 30 MG/ML IJ SOLN
30.0000 mg | Freq: Once | INTRAMUSCULAR | Status: AC
  Administered 2020-04-26: 30 mg via INTRAMUSCULAR

## 2020-04-26 MED FILL — predniSONE 20 MG TABS: 20 | 5 days supply | Qty: 20 | Fill #0

## 2020-04-26 NOTE — Assessment & Plan Note (Signed)
Patient is likely having a flare.  Given prednisone.  Exacerbation of this chronic problem.  Toradol given today.  Discussed icing regimen.  Increase activity as tolerated.  Follow-up with me again in 4 weeks or will call sooner if continuing to have trouble

## 2020-04-26 NOTE — Patient Instructions (Addendum)
Good to see you Prednisone 40 mg daily for 5 days starting tomorrow  See me again in 4 weeks

## 2020-04-27 ENCOUNTER — Encounter: Payer: Self-pay | Admitting: Family Medicine

## 2020-04-27 ENCOUNTER — Encounter: Payer: Self-pay | Admitting: Internal Medicine

## 2020-04-28 ENCOUNTER — Other Ambulatory Visit (HOSPITAL_COMMUNITY): Payer: Self-pay | Admitting: Internal Medicine

## 2020-04-28 MED FILL — VICTOZA 18 MG/3 ML INJECT P: 18 | 30 days supply | Qty: 9 | Fill #0

## 2020-05-03 MED FILL — COSENTYX 300 MG DOSE-2 PENS: 150 | 28 days supply | Qty: 2 | Fill #1

## 2020-05-27 ENCOUNTER — Encounter: Payer: Self-pay | Admitting: Family Medicine

## 2020-05-27 ENCOUNTER — Other Ambulatory Visit (HOSPITAL_COMMUNITY): Payer: Self-pay | Admitting: Rheumatology

## 2020-05-27 ENCOUNTER — Other Ambulatory Visit: Payer: Self-pay

## 2020-05-27 ENCOUNTER — Ambulatory Visit: Payer: No Typology Code available for payment source | Admitting: Family Medicine

## 2020-05-27 VITALS — BP 142/60 | HR 90 | Ht 63.0 in | Wt 232.0 lb

## 2020-05-27 DIAGNOSIS — M545 Low back pain, unspecified: Secondary | ICD-10-CM | POA: Diagnosis not present

## 2020-05-27 DIAGNOSIS — M999 Biomechanical lesion, unspecified: Secondary | ICD-10-CM | POA: Diagnosis not present

## 2020-05-27 DIAGNOSIS — G8929 Other chronic pain: Secondary | ICD-10-CM | POA: Diagnosis not present

## 2020-05-27 NOTE — Assessment & Plan Note (Signed)
Low back pain. Mild arthritis and has done well over time, mild exacerbation. Does have mild topical NSAIDs no change in management.  RTC in 4-8 weeks

## 2020-05-27 NOTE — Progress Notes (Signed)
[place] [address] [place] Phone: [phone] Subjective:   I Tabitha [name] am serving as a Education administrator for Dr. Hulan Saas.  This visit occurred during the SARS-CoV-2 public health emergency.  Safety protocols were in place, including screening questions prior to the visit, additional usage of staff PPE, and extensive cleaning of exam room while observing appropriate contact time as indicated for disinfecting solutions.   I'm seeing this patient by the request  of:  Hoyt Koch, MD  CC: back and neck pain   [id number]  Tabitha [name] is a 54 y.o. female coming in with complaint of back and neck pain. OMT 04/26/2020. Patient states she is doing better. Has had some stress in relationship and recent move. Has not been as active as usual. Does not believe any flare from autoimmune  Medications patient has been prescribed: Prednisone  Taking:no          Reviewed prior external information including notes and imaging from previsou exam, outside providers and external EMR if available.   As well as notes that were available from care everywhere and other healthcare systems.  Past medical history, social, surgical and family history all reviewed in electronic medical record.  No pertanent information unless stated regarding to the chief complaint.   Past Medical History:  Diagnosis Date  . Arthritis   . De Quervain's disease (tenosynovitis)   . Dermatophytosis, scalp   . Diabetes mellitus   . Hypercholesteremia   . Obesity   . Polycystic ovarian disease   . Psoriatic arthritis ([place])   . Wears glasses     Allergies  Allergen Reactions  . Chocolate Anaphylaxis  . Food     chocolate  . Statins     Intolerant  lft elevation  . Latex Rash     Review of Systems:  No headache, visual changes, nausea, vomiting, diarrhea, constipation, dizziness, abdominal pain, skin rash, fevers, chills, night sweats, weight  loss, swollen lymph nodes, body aches, joint swelling, chest pain, shortness of breath, mood changes. POSITIVE muscle aches  Objective  Blood pressure (!) 142/60, pulse 90, height 5\' 3"  (1.6 m), weight 232 lb (105.2 kg), SpO2 97 %.   General: No apparent distress alert and oriented x3 mood and affect normal, dressed appropriately.  HEENT: Pupils equal, extraocular movements intact  Respiratory: Patient's speak in full sentences and does not appear short of breath  Cardiovascular: No lower extremity edema, non tender, no erythema  Neuro: Cranial nerves II through XII are intact, neurovascularly intact in all extremities with 2+ DTRs and 2+ pulses.  Gait normal with good balance and coordination.  MSK:  Non tender with full range of motion and good stability and symmetric strength and tone of shoulders, elbows, wrist, hip, and ankles bilaterally. No swelling of the knees  Back - mild loss of lordosis does have tightness with FABER right greater then left negative SLT.   Osteopathic findings  C6 flexed rotated and side bent right T4 extended rotated and side bent right inhaled rib T10 extended rotated and side bent left L3 flexed rotated and side bent right Sacrum right on right       Assessment and Plan:  Low back pain Low back pain. Mild arthritis and has done well over time, mild exacerbation. Does have mild topical NSAIDs no change in management.  RTC in 4-8 weeks     Nonallopathic problems  Decision today to treat with OMT was based on  Physical Exam  After verbal consent patient was treated with HVLA, ME, FPR techniques in cervical, rib, thoracic, lumbar, and sacral  areas  Patient tolerated the procedure well with improvement in symptoms  Patient given exercises, stretches and lifestyle modifications  See medications in patient instructions if given  Patient will follow up in 4-8 weeks      The above documentation has been reviewed and is accurate and complete  Lyndal Pulley, DO       Note: This dictation was prepared with Dragon dictation along with smaller phrase technology. Any transcriptional errors that result from this process are unintentional.

## 2020-05-27 NOTE — Patient Instructions (Addendum)
Good to see you Be proud of what you accomplished See me again in 6-8 weeks

## 2020-06-01 ENCOUNTER — Other Ambulatory Visit (HOSPITAL_COMMUNITY): Payer: Self-pay | Admitting: Internal Medicine

## 2020-06-01 MED FILL — SYNJARDY XR 10-1000 MG TB24: 10-1000 | 90 days supply | Qty: 90 | Fill #0

## 2020-06-02 MED FILL — COSENTYX 300 MG DOSE-2 PENS: 150 | 28 days supply | Qty: 2 | Fill #2

## 2020-06-02 MED FILL — FOLIC ACID 1 MG TABS: 1 | 90 days supply | Qty: 180 | Fill #0

## 2020-06-03 MED FILL — METHOTREXATE 25 MG/ML VIAL: 50 | 84 days supply | Qty: 12 | Fill #0

## 2020-06-14 DIAGNOSIS — F4323 Adjustment disorder with mixed anxiety and depressed mood: Secondary | ICD-10-CM | POA: Diagnosis not present

## 2020-06-15 ENCOUNTER — Other Ambulatory Visit (HOSPITAL_COMMUNITY): Payer: Self-pay | Admitting: Otolaryngology

## 2020-06-15 DIAGNOSIS — G4733 Obstructive sleep apnea (adult) (pediatric): Secondary | ICD-10-CM | POA: Diagnosis not present

## 2020-06-15 DIAGNOSIS — R0683 Snoring: Secondary | ICD-10-CM | POA: Diagnosis not present

## 2020-06-15 DIAGNOSIS — J310 Chronic rhinitis: Secondary | ICD-10-CM | POA: Diagnosis not present

## 2020-06-15 MED FILL — FLUTICASONE PROP 50 MCG SPR: 50 | 30 days supply | Qty: 16 | Fill #0

## 2020-06-24 DIAGNOSIS — F4323 Adjustment disorder with mixed anxiety and depressed mood: Secondary | ICD-10-CM | POA: Diagnosis not present

## 2020-06-29 ENCOUNTER — Encounter: Payer: Self-pay | Admitting: Internal Medicine

## 2020-06-29 DIAGNOSIS — R0683 Snoring: Secondary | ICD-10-CM

## 2020-06-30 MED FILL — VICTOZA 18 MG/3 ML INJECT P: 18 | 30 days supply | Qty: 9 | Fill #1

## 2020-06-30 MED FILL — COSENTYX 300 MG DOSE-2 PENS: 150 | 28 days supply | Qty: 2 | Fill #3

## 2020-07-01 DIAGNOSIS — F4323 Adjustment disorder with mixed anxiety and depressed mood: Secondary | ICD-10-CM | POA: Diagnosis not present

## 2020-07-05 ENCOUNTER — Other Ambulatory Visit (HOSPITAL_COMMUNITY): Payer: Self-pay | Admitting: Rheumatology

## 2020-07-05 DIAGNOSIS — Z79899 Other long term (current) drug therapy: Secondary | ICD-10-CM | POA: Diagnosis not present

## 2020-07-05 DIAGNOSIS — M8949 Other hypertrophic osteoarthropathy, multiple sites: Secondary | ICD-10-CM | POA: Diagnosis not present

## 2020-07-05 DIAGNOSIS — L4050 Arthropathic psoriasis, unspecified: Secondary | ICD-10-CM | POA: Diagnosis not present

## 2020-07-05 DIAGNOSIS — L409 Psoriasis, unspecified: Secondary | ICD-10-CM | POA: Diagnosis not present

## 2020-07-05 MED FILL — CELECOXIB 100 MG CAPS: 100 | 30 days supply | Qty: 60 | Fill #0

## 2020-07-06 ENCOUNTER — Encounter: Payer: Self-pay | Admitting: Internal Medicine

## 2020-07-06 ENCOUNTER — Other Ambulatory Visit: Payer: Self-pay | Admitting: Internal Medicine

## 2020-07-06 MED ORDER — ESCITALOPRAM OXALATE 10 MG PO TABS: 10.0000 mg | ORAL_TABLET | Freq: Every day | ORAL | 3 refills | Status: DC

## 2020-07-07 ENCOUNTER — Other Ambulatory Visit: Payer: Self-pay

## 2020-07-07 ENCOUNTER — Encounter: Payer: Self-pay | Admitting: Family Medicine

## 2020-07-07 ENCOUNTER — Ambulatory Visit: Payer: 59 | Admitting: Family Medicine

## 2020-07-07 VITALS — BP 124/70 | HR 107 | Ht 63.0 in | Wt 229.0 lb

## 2020-07-07 DIAGNOSIS — M999 Biomechanical lesion, unspecified: Secondary | ICD-10-CM

## 2020-07-07 DIAGNOSIS — L4050 Arthropathic psoriasis, unspecified: Secondary | ICD-10-CM

## 2020-07-07 NOTE — Patient Instructions (Signed)
Great to see you Keep doing whatever you are doing See me in 6-8 weeks

## 2020-07-07 NOTE — Progress Notes (Signed)
[place] Phone: [phone] Subjective:   Tabitha Tabitha [name], am serving as a scribe for Dr. Hulan Saas. This visit occurred during the SARS-CoV-2 public health emergency.  Safety protocols were in place, including screening questions prior to the visit, additional usage of staff PPE, and extensive cleaning of exam room while observing appropriate contact time as indicated for disinfecting solutions.  I'm seeing this patient by the request  of:  Hoyt Koch, MD  CC: Back pain follow-up  [id number]  Tabitha Tabitha [name] is a 55 y.o. female coming in with complaint of back and neck pain. OMT 05/27/2020. Patient states that her neck pain has increased over past few days. Uses Tylenol for pain. Using Celebrex as well.           Reviewed prior external information including notes and imaging from previsou exam, outside providers and external EMR if available.   As well as notes that were available from care everywhere and other healthcare systems.  Past medical history, social, surgical and family history all reviewed in electronic medical record.  Tabitha [name] pertanent information unless stated regarding to the chief complaint.   Past Medical History:  Diagnosis Date  . Arthritis   . De Quervain's disease (tenosynovitis)   . Dermatophytosis, scalp   . Diabetes mellitus   . Hypercholesteremia   . Obesity   . Polycystic ovarian disease   . Psoriatic arthritis ([place])   . Wears glasses     Allergies  Allergen Reactions  . Chocolate Anaphylaxis  . Food     chocolate  . Statins     Intolerant  lft elevation  . Latex Rash     Review of Systems:  Tabitha [name] headache, visual changes, nausea, vomiting, diarrhea, constipation, dizziness, abdominal pain, skin rash, fevers, chills, night sweats, weight loss, swollen lymph nodes,  joint swelling, chest pain, shortness of breath, mood changes. POSITIVE muscle aches, body  aches  Objective  Blood pressure 124/70, pulse (!) 107, height 5\' 3"  (1.6 m), weight 229 lb (103.9 kg), SpO2 97 %.   General: Tabitha [name] apparent distress alert and oriented x3 mood and affect normal, dressed appropriately.  Patient has lost some weight since last exam. HEENT: Pupils equal, extraocular movements intact  Respiratory: Patient's speak in full sentences and does not appear short of breath  Cardiovascular: Tabitha [name] lower extremity edema, non tender, Tabitha [name] erythema  Gait normal with good balance and coordination.  MSK: Arthritis of the knee still noted. Back -neck exam mild loss of lordosis.  Some tenderness to palpation of the paraspinal musculature of the cervical spine.  Patient does have a mild limitation in extension of the neck by 5.  Negative Spurling's.  Low back exam has some very mild loss of lordosis but nothing severe.  Osteopathic findings  C2 flexed rotated and side bent right C6 flexed rotated and side bent left  T5 extended rotated and side bent right inhaled rib L5 flexed rotated and side bent left Sacrum right on right       Assessment and Plan: Psoriatic arthritis [facility]) Patient is doing much better with her new rheumatologist.  Feels like she is doing good.  Does have Celebrex for breakthrough pain and has noticed some improvement with that.  Patient is to avoid other anti-inflammatories.  We discussed her working position, responding well to manipulation.  Follow-up again in 6 to 8 weeks     Nonallopathic problems  Decision today to treat  with OMT was based on Physical Exam  After verbal consent patient was treated with  ME, FPR techniques in cervical, rib, thoracic, lumbar, and sacral  areas with patient having some increasing neck discomfort avoided HVLA.  Patient tolerated the procedure well with improvement in symptoms  Patient given exercises, stretches and lifestyle modifications  See medications in patient instructions if given  Patient will follow  up in 4-8 weeks      The above documentation has been reviewed and is accurate and complete Tabitha Pulley, DO       Note: This dictation was prepared with Dragon dictation along with smaller phrase technology. Any transcriptional errors that result from this process are unintentional.

## 2020-07-07 NOTE — Assessment & Plan Note (Signed)
Patient is doing much better with her new rheumatologist.  Feels like she is doing good.  Does have Celebrex for breakthrough pain and has noticed some improvement with that.  Patient is to avoid other anti-inflammatories.  We discussed her working position, responding well to manipulation.  Follow-up again in 6 to 8 weeks

## 2020-07-08 ENCOUNTER — Other Ambulatory Visit: Payer: Self-pay | Admitting: Internal Medicine

## 2020-07-08 DIAGNOSIS — F4323 Adjustment disorder with mixed anxiety and depressed mood: Secondary | ICD-10-CM | POA: Diagnosis not present

## 2020-07-08 MED ORDER — BUSPIRONE HCL 5 MG PO TABS: 5.0000 mg | ORAL_TABLET | Freq: Two times a day (BID) | ORAL | 0 refills | Status: DC | PRN

## 2020-07-08 MED FILL — busPIRone HCL 5 MG TABS: 5 | 30 days supply | Qty: 60 | Fill #0

## 2020-07-08 NOTE — Addendum Note (Signed)
Addended by: Pricilla Holm A on: 07/08/2020 02:17 PM   Modules accepted: Orders

## 2020-07-14 ENCOUNTER — Ambulatory Visit: Payer: 59

## 2020-07-14 ENCOUNTER — Other Ambulatory Visit: Payer: Self-pay

## 2020-07-14 DIAGNOSIS — G4733 Obstructive sleep apnea (adult) (pediatric): Secondary | ICD-10-CM | POA: Diagnosis not present

## 2020-07-14 DIAGNOSIS — R0683 Snoring: Secondary | ICD-10-CM

## 2020-07-21 DIAGNOSIS — G4733 Obstructive sleep apnea (adult) (pediatric): Secondary | ICD-10-CM | POA: Diagnosis not present

## 2020-07-22 ENCOUNTER — Telehealth: Payer: Self-pay | Admitting: Pulmonary Disease

## 2020-07-22 DIAGNOSIS — F4323 Adjustment disorder with mixed anxiety and depressed mood: Secondary | ICD-10-CM | POA: Diagnosis not present

## 2020-07-22 NOTE — Telephone Encounter (Signed)
Call patient  Sleep study result  Date of study: 07/14/2020  Impression: Moderate obstructive sleep apnea Moderate oxygen desaturations  Recommendation: DME referral  Recommend CPAP therapy for moderate obstructive sleep apnea  Auto titrating CPAP with pressure settings of 5-15 will be appropriate  Encourage weight loss measures  Follow-up in the office 4 to 6 weeks following initiation of treatment   Note: Patient had sent in a note about not sleeping well on the day of the study  Kindly reassure the patient that there was enough information to make a diagnosis and establish treatment with CPAP

## 2020-07-23 NOTE — Telephone Encounter (Signed)
Called and spoke with patient to let her know of her sleep study results. She said she would like to proceed with CPAP. Order placed. Advised her that there is a delay in getting machines due to the recall a couple months ago. She expressed understanding. I will route this to Dr. Nathanial Millman office. Dr. Wilburn Cornelia put in a referral on 07/16/19 for the patient to have a sleep consult our office will call her to set that up. Order for CPAP can be placed at the time of her visit. Nothing further needed at this time.

## 2020-07-27 MED FILL — FLUTICASONE PROP 50 MCG SPR: 50 | 30 days supply | Qty: 16 | Fill #1

## 2020-07-27 MED FILL — COSENTYX 300 MG DOSE-2 PENS: 150 | 28 days supply | Qty: 2 | Fill #4

## 2020-07-27 MED FILL — UNIFINE PENTIPS 31GX3/16: 31G X 5 MM | 90 days supply | Qty: 100 | Fill #2

## 2020-07-29 DIAGNOSIS — F4323 Adjustment disorder with mixed anxiety and depressed mood: Secondary | ICD-10-CM | POA: Diagnosis not present

## 2020-07-30 ENCOUNTER — Telehealth: Payer: Self-pay | Admitting: Pulmonary Disease

## 2020-07-30 ENCOUNTER — Other Ambulatory Visit: Payer: Self-pay

## 2020-07-30 ENCOUNTER — Ambulatory Visit (INDEPENDENT_AMBULATORY_CARE_PROVIDER_SITE_OTHER): Payer: 59 | Admitting: Adult Health

## 2020-07-30 DIAGNOSIS — G4733 Obstructive sleep apnea (adult) (pediatric): Secondary | ICD-10-CM

## 2020-07-30 NOTE — Telephone Encounter (Signed)
Patient would like more details.  She cannot see even the first page of the sleep study. I told her it has to be scanned in or Dr. Ander Slade would have to release to her mychart..  Looks like it is scanned in. Will message AO to see if he can release to mychart. I offered to mail results patient did not voice she wanted that.  Telephone visit with Rexene Edison at 4:30pm for patient

## 2020-08-03 ENCOUNTER — Other Ambulatory Visit: Payer: Self-pay

## 2020-08-03 ENCOUNTER — Ambulatory Visit: Payer: 59 | Admitting: Adult Health

## 2020-08-03 ENCOUNTER — Encounter: Payer: Self-pay | Admitting: Adult Health

## 2020-08-03 ENCOUNTER — Telehealth: Payer: Self-pay | Admitting: *Deleted

## 2020-08-03 DIAGNOSIS — G4733 Obstructive sleep apnea (adult) (pediatric): Secondary | ICD-10-CM | POA: Diagnosis not present

## 2020-08-03 DIAGNOSIS — Z20822 Contact with and (suspected) exposure to covid-19: Secondary | ICD-10-CM | POA: Diagnosis not present

## 2020-08-03 DIAGNOSIS — Z03818 Encounter for observation for suspected exposure to other biological agents ruled out: Secondary | ICD-10-CM | POA: Diagnosis not present

## 2020-08-03 NOTE — Progress Notes (Signed)
@Patient  ID: Tabitha [name], female    DOB: 1965-06-26, 55 y.o.   MRN: [id number]  Chief Complaint  Patient presents with  . Consult    Referring provider: Hoyt Koch, *  HPI: 55 year old female with multiple medical problems including diabetes, psoriatic arthritis, morbid obesity presents for sleep consult August 03, 2020 with restless sleep and snoring  TEST/EVENTS :  Home sleep study July 21, 2020 showed AHI 21.9/hour, SPO2 low 78% Moderate obstructive sleep apnea Moderate oxygen desaturations  08/03/2020 sleep consult Patient presents for a sleep consult.  Patient was seen recently at her primary care provider's office with restless sleep and snoring.  She was recommended for a sleep study.  She had an overnight home sleep study done on July 21, 2020 that showed moderate obstructive sleep apnea with moderate desaturations.  AHI was 21.9/hour, SPO2 low 78%.  We discussed her sleep study results.  And went over treatment options including weight loss, oral appliance and CPAP. Patient would like to proceed with CPAP therapy.  Patient's Epworth score was 0.  Patient goes to bed each night around 10 PM.  Gets up each day around 6:30 PM, gets up between 2-4 times each night.  Says she has restless sleep and reported snoring.  She has minimum daytime sleepiness.  Takes her about 30 minutes to go to sleep.  She has 2 to 3 cups of coffee each day and typically 1 cup of tea in the afternoon.  Patient works from home.  She is a Marine scientist.  She quit smoking 30 years ago.  Has rare social alcohol.  Has no children.  Patient is divorced.  Has 1 pet a dog.  She denies any narcolepsy, cataplexy, teeth grinding, sleepwalking.  Patient says that she has been working on weight loss and is down about 60 pounds over the last 4 years with diet and exercise.  Patient does have multiple medical problems including diabetes, psoriatic arthritis.   Allergies  Allergen Reactions  . Chocolate  Anaphylaxis  . Food     chocolate  . Statins     Intolerant  lft elevation  . Latex Rash    Immunization History  Administered Date(s) Administered  . Influenza,inj,Quad PF,6+ Mos 03/26/2014, 04/23/2020  . Influenza-Unspecified 04/11/2015, 03/26/2017, 03/26/2018  . Moderna Sars-Covid-2 Vaccination 06/10/2019, 07/01/2019  . PFIZER(Purple Top)SARS-COV-2 Vaccination 06/10/2019, 07/01/2019, 02/11/2020  . Tdap 06/12/2010    Past Medical History:  Diagnosis Date  . Arthritis   . Asthma   . De Quervain's disease (tenosynovitis)   . Dermatophytosis, scalp   . Diabetes mellitus   . Hypercholesteremia   . Obesity   . Polycystic ovarian disease   . Psoriatic arthritis ([place])   . Wears glasses    Past Surgical History:  Procedure Laterality Date  . BREAST BIOPSY Right   . CARPAL TUNNEL RELEASE    . COLONOSCOPY    . DE QUERVAIN'S RELEASE  2012   left  . KNEE ARTHROPLASTY    . KNEE ARTHROSCOPY Right 05/06/2013   Procedure: RIGHT KNEE ARTHROSCOPY ;  Surgeon: Hessie Dibble, MD;  Location: [facility];  Service: Orthopedics;  Laterality: Right;  partial lateral minisectomy and chondroplasty  . WISDOM TOOTH EXTRACTION    . WRIST SURGERY  2001   carpel tunnel -rt     Tobacco History: Social History   Tobacco Use  Smoking Status Former Smoker  . Packs/day: 1.30  . Years: 12.00  . Pack years: 15.60  . Types:  Cigarettes  . Start date: 06/12/1984  . Quit date: 03/18/1995  . Years since quitting: 25.3  Smokeless Tobacco Never Used  Tobacco Comment   Not interested in returning to smoking.    Counseling given: Not Answered Comment: Not interested in returning to smoking.    Outpatient Medications Prior to Visit  Medication Sig Dispense Refill  . ACCU-CHEK GUIDE test strip   3  . Acetaminophen (TYLENOL PO) Take by mouth as needed.    Marland Kitchen aspirin EC 81 MG tablet Take 81 mg by mouth daily.    . busPIRone (BUSPAR) 5 MG tablet Take 1 tablet (5 mg total) by mouth 2  (two) times daily as needed. 60 tablet 0  . cholecalciferol (VITAMIN D3) 25 MCG (1000 UT) tablet Take 1,000 Units by mouth daily.    . clobetasol cream (TEMOVATE) 6.06 % Apply 1 application topically 2 (two) times daily. As directed; never apply to face. 30 g 2  . desonide (DESOWEN) 0.05 % cream Apply topically 2 (two) times daily as needed. 30 g 0  . Diclofenac Sodium (PENNSAID) 2 % SOLN Place 2 application onto the skin 2 (two) times daily. 301 g 3  . folic acid (FOLVITE) 1 MG tablet TAKE 1 TABLET BY MOUTH TWO TIMES DAILY 180 tablet 4  . Omega-3 Fatty Acids (FISH OIL) 1200 MG CAPS Take 1-2 capsules (1,200-2,400 mg total) by mouth 2 (two) times daily. Takes 2400 mg qam and 1200 mg qpm    . Secukinumab, 300 MG Dose, (COSENTYX SENSOREADY, 300 MG,) 150 MG/ML SOAJ Inject 150 mg into the skin every 14 (fourteen) days. 2 mL 5  . SYNJARDY XR 03-999 MG TB24 daily.    . TURMERIC PO Take by mouth 2 (two) times daily.     Marland Kitchen UNIFINE PENTIPS 32G X 4 MM MISC   4  . VICTOZA 18 MG/3ML SOPN INJECT 1.8 MG INTO THE SKIN AS DIRECTED DAILY. (Patient taking differently: 1.2 mg.) 9 mL 3   No facility-administered medications prior to visit.     Review of Systems:   Constitutional:   No  weight loss, night sweats,  Fevers, chills, fatigue, or  lassitude.  HEENT:   No headaches,  Difficulty swallowing,  Tooth/dental problems, or  Sore throat,                No sneezing, itching, ear ache, +nasal congestion, post nasal drip,   CV:  No chest pain,  Orthopnea, PND, swelling in lower extremities, anasarca, dizziness, palpitations, syncope.   GI  No heartburn, indigestion, abdominal pain, nausea, vomiting, diarrhea, change in bowel habits, loss of appetite, bloody stools.   Resp: No shortness of breath with exertion or at rest.  No excess mucus, no productive cough,  No non-productive cough,  No coughing up of blood.  No change in color of mucus.  No wheezing.  No chest wall deformity  Skin: no rash or  lesions.  GU: no dysuria, change in color of urine, no urgency or frequency.  No flank pain, no hematuria   MS:  No joint pain or swelling.  No decreased range of motion.  No back pain.    Physical Exam  BP 120/70 (BP Location: Left Arm, Patient Position: Sitting, Cuff Size: Large)   Pulse (!) 106   Temp 98.6 F (37 C) (Temporal)   Ht 5\' 2"  (1.575 m)   Wt 229 lb 12.8 oz (104.2 kg)   SpO2 96%   BMI 42.03 kg/m   GEN: A/Ox3;  pleasant , NAD, well nourished    HEENT:  [place]/AT,  NOSE-clear, THROAT-clear, no lesions, no postnasal drip or exudate noted.  Class II-III NP airway  NECK:  Supple w/ fair ROM; no JVD; normal carotid impulses w/o bruits; no thyromegaly or nodules palpated; no lymphadenopathy.    RESP  Clear  P & A; w/o, wheezes/ rales/ or rhonchi. no accessory muscle use, no dullness to percussion  CARD:  RRR, no m/r/g, no peripheral edema, pulses intact, no cyanosis or clubbing.  GI:   Soft & nt; nml bowel sounds; no organomegaly or masses detected.   Musco: Warm bil, no deformities or joint swelling noted.   Neuro: alert, no focal deficits noted.    Skin: Warm, no lesions or rashes    Lab Results:    BNP No results found for: BNP  ProBNP No results found for: PROBNP  Imaging: No results found.    No flowsheet data found.  No results found for: NITRICOXIDE      Assessment & Plan:   OSA (obstructive sleep apnea) Moderate obstructive sleep apnea with moderate O2 desaturations.  Patient has several comorbidities including diabetes and psoriatic arthritis.  With her moderate sleep apnea and desaturations.  Feel that she would benefit from nocturnal CPAP.  Patient is in agreement.  Patient education given on sleep apnea and CPAP therapy.  Plan  Patient Instructions  Begin CPAP At bedtime   Wear all night .  Work on healthy weight loss.  Trial dream nasal mask  Do not drive if sleepy .  Saline nasal spray and gel As needed   Follow up with Dr.  Hermina Staggers in 2-3 months and As needed         Morbid obesity [facility]) Work on healthy weight loss     Rexene Edison, NP 08/03/2020

## 2020-08-03 NOTE — Patient Instructions (Signed)
Begin CPAP At bedtime   Wear all night .  Work on healthy weight loss.  Trial dream nasal mask  Do not drive if sleepy .  Saline nasal spray and gel As needed   Follow up with Dr. Hermina Staggers in 2-3 months and As needed

## 2020-08-03 NOTE — Assessment & Plan Note (Signed)
Moderate obstructive sleep apnea with moderate O2 desaturations.  Patient has several comorbidities including diabetes and psoriatic arthritis.  With her moderate sleep apnea and desaturations.  Feel that she would benefit from nocturnal CPAP.  Patient is in agreement.  Patient education given on sleep apnea and CPAP therapy.  Plan  Patient Instructions  Begin CPAP At bedtime   Wear all night .  Work on healthy weight loss.  Trial dream nasal mask  Do not drive if sleepy .  Saline nasal spray and gel As needed   Follow up with Dr. Hermina Staggers in 2-3 months and As needed

## 2020-08-03 NOTE — Addendum Note (Signed)
Addended by: Vanessa Barbara on: 08/03/2020 05:30 PM   Modules accepted: Orders

## 2020-08-03 NOTE — Telephone Encounter (Signed)
Called and spoke with patient, patient had no preference on DME company to use for her CPAP.  Advised that I would send it to [facility] and they will contact her when it is in and show her how to use it.  Also advised it could take anywhere from 4-12 weeks to receive it d/t a recent recall on one brand of CPAP machine.  She verbalized understanding.  Nothing further needed.

## 2020-08-03 NOTE — Assessment & Plan Note (Signed)
Work on healthy weight loss 

## 2020-08-12 DIAGNOSIS — N952 Postmenopausal atrophic vaginitis: Secondary | ICD-10-CM | POA: Diagnosis not present

## 2020-08-12 DIAGNOSIS — N898 Other specified noninflammatory disorders of vagina: Secondary | ICD-10-CM | POA: Diagnosis not present

## 2020-08-12 DIAGNOSIS — Z01419 Encounter for gynecological examination (general) (routine) without abnormal findings: Secondary | ICD-10-CM | POA: Diagnosis not present

## 2020-08-12 DIAGNOSIS — B373 Candidiasis of vulva and vagina: Secondary | ICD-10-CM | POA: Diagnosis not present

## 2020-08-12 DIAGNOSIS — N882 Stricture and stenosis of cervix uteri: Secondary | ICD-10-CM | POA: Diagnosis not present

## 2020-08-12 DIAGNOSIS — Z113 Encounter for screening for infections with a predominantly sexual mode of transmission: Secondary | ICD-10-CM | POA: Diagnosis not present

## 2020-08-12 LAB — HM PAP SMEAR: HM Pap smear: NEGATIVE

## 2020-08-13 ENCOUNTER — Other Ambulatory Visit (HOSPITAL_COMMUNITY): Payer: Self-pay | Admitting: Obstetrics and Gynecology

## 2020-08-13 MED FILL — NYSTATIN-TRIAMCINOLONE CRM: 100000-0.1 | 10 days supply | Qty: 15 | Fill #0

## 2020-08-14 MED FILL — METHOTREXATE 25 MG/ML VIAL: 50 | 84 days supply | Qty: 12 | Fill #1

## 2020-08-16 DIAGNOSIS — F4323 Adjustment disorder with mixed anxiety and depressed mood: Secondary | ICD-10-CM | POA: Diagnosis not present

## 2020-08-16 DIAGNOSIS — Z1159 Encounter for screening for other viral diseases: Secondary | ICD-10-CM | POA: Diagnosis not present

## 2020-08-18 NOTE — Progress Notes (Signed)
[facility] [address] [place] Phone: [phone] Subjective:   I Tabitha [name] am serving as a Education administrator for Dr. Hulan Saas.  This visit occurred during the SARS-CoV-2 public health emergency.  Safety protocols were in place, including screening questions prior to the visit, additional usage of staff PPE, and extensive cleaning of exam room while observing appropriate contact time as indicated for disinfecting solutions.   I'm seeing this patient by the request  of:  Hoyt Koch, MD  CC: Back and neck pain follow-up  [id number]  SEE BEHARRY is a 55 y.o. female coming in with complaint of back and neck pain. OMT 07/07/2020. Patient states she is doing pretty good. Neck has been bad but is better today. Some issues with the knees as well.  Patient does have known severe arthritic changes of the knee sometimes flares of her psoriatic arthritis.  Patient states seems to be doing relatively well at this time.  Continues to be able to workout on a regular basis.  Does have tightness of the neck.  Recently did travel.  Medications patient has been prescribed: None   Taking:         Reviewed prior external information including notes and imaging from previsou exam, outside providers and external EMR if available.   As well as notes that were available from care everywhere and other healthcare systems.  Past medical history, social, surgical and family history all reviewed in electronic medical record.  No pertanent information unless stated regarding to the chief complaint.   Past Medical History:  Diagnosis Date  . Arthritis   . Asthma   . De Quervain's disease (tenosynovitis)   . Dermatophytosis, scalp   . Diabetes mellitus   . Hypercholesteremia   . Obesity   . Polycystic ovarian disease   . Psoriatic arthritis ([place])   . Wears glasses     Allergies  Allergen Reactions  . Chocolate Anaphylaxis  . Food      chocolate  . Statins     Intolerant  lft elevation  . Latex Rash     Review of Systems:  No headache, visual changes, nausea, vomiting, diarrhea, constipation, dizziness, abdominal pain, skin rash, fevers, chills, night sweats, weight loss, swollen lymph nodes, chest pain, shortness of breath, mood changes. POSITIVE muscle aches, body aches, joint swelling  Objective  Blood pressure 120/70, pulse 76, height 5\' 2"  (1.575 m), weight 229 lb (103.9 kg), SpO2 98 %.   General: No apparent distress alert and oriented x3 mood and affect normal, dressed appropriately.  HEENT: Pupils equal, extraocular movements intact  Respiratory: Patient's speak in full sentences and does not appear short of breath  Cardiovascular: No lower extremity edema, non tender, no erythema  Gait normal with good balance and coordination.  MSK: Left knee does have a trace effusion noted.  Patient does have mild crepitus.  Mild instability noted with valgus and varus force Back -back exam does have some mild increase in kyphosis of the upper back and loss of lordosis in the lumbar spine.  Very mild increase in tightness of the FABER test bilaterally.  Negative straight leg test.  Osteopathic findings  C2 flexed rotated and side bent right C6 flexed rotated and side bent left T3 extended rotated and side bent right inhaled rib T9 extended rotated and side bent left L2 flexed rotated and side bent right Sacrum right on right       Assessment and Plan:  Psoriatic arthritis ([place]) Patient does have psoriatic arthritis.  Responding extremely well though to osteopathic manipulation.  Encourage patient to continue to work on the weight loss.  Patient would need a BMI under 40 for any type of knee replacement.  Patient is doing significantly better.  Encourage patient to continue to stay active and continue to work out.  Follow-up with me again in 6 to 8 weeks  Degenerative arthritis of knee, bilateral Patient is doing  relatively well at this time.  We will continue to monitor.  Any worsening pain consider injections.  Have discussed again with patient about the MRI findings that did suggest a little of PVNS but likely more psoriatic arthritis.  Patient improving over the course of time though do not think that that would change management and patient would be looking at a knee replacement if anything which patient would not like to do at this time but does have more weight loss even to be a candidate.  Follow-up with me again in 6 to 8 weeks    Nonallopathic problems  Decision today to treat with OMT was based on Physical Exam  After verbal consent patient was treated with HVLA, ME, FPR techniques in cervical, rib, thoracic, lumbar, and sacral  areas avoided HVLA on the neck and did more muscle energy  Patient tolerated the procedure well with improvement in symptoms  Patient given exercises, stretches and lifestyle modifications  See medications in patient instructions if given  Patient will follow up in 4-8 weeks      The above documentation has been reviewed and is accurate and complete Lyndal Pulley, DO       Note: This dictation was prepared with Dragon dictation along with smaller phrase technology. Any transcriptional errors that result from this process are unintentional.

## 2020-08-19 ENCOUNTER — Encounter: Payer: Self-pay | Admitting: Family Medicine

## 2020-08-19 ENCOUNTER — Other Ambulatory Visit: Payer: Self-pay

## 2020-08-19 ENCOUNTER — Ambulatory Visit: Payer: 59 | Admitting: Family Medicine

## 2020-08-19 VITALS — BP 120/70 | HR 76 | Ht 62.0 in | Wt 229.0 lb

## 2020-08-19 DIAGNOSIS — M999 Biomechanical lesion, unspecified: Secondary | ICD-10-CM

## 2020-08-19 DIAGNOSIS — L4050 Arthropathic psoriasis, unspecified: Secondary | ICD-10-CM

## 2020-08-19 DIAGNOSIS — M170 Bilateral primary osteoarthritis of knee: Secondary | ICD-10-CM

## 2020-08-19 NOTE — Assessment & Plan Note (Signed)
Patient does have psoriatic arthritis.  Responding extremely well though to osteopathic manipulation.  Encourage patient to continue to work on the weight loss.  Patient would need a BMI under 40 for any type of knee replacement.  Patient is doing significantly better.  Encourage patient to continue to stay active and continue to work out.  Follow-up with me again in 6 to 8 weeks

## 2020-08-19 NOTE — Assessment & Plan Note (Signed)
Patient is doing relatively well at this time.  We will continue to monitor.  Any worsening pain consider injections.  Have discussed again with patient about the MRI findings that did suggest a little of PVNS but likely more psoriatic arthritis.  Patient improving over the course of time though do not think that that would change management and patient would be looking at a knee replacement if anything which patient would not like to do at this time but does have more weight loss even to be a candidate.  Follow-up with me again in 6 to 8 weeks

## 2020-08-19 NOTE — Patient Instructions (Addendum)
Good to see you Overall doing good Keep working on posture Like the last name See me again in 6-8 weeks

## 2020-08-23 DIAGNOSIS — F4323 Adjustment disorder with mixed anxiety and depressed mood: Secondary | ICD-10-CM | POA: Diagnosis not present

## 2020-08-25 DIAGNOSIS — G4733 Obstructive sleep apnea (adult) (pediatric): Secondary | ICD-10-CM | POA: Diagnosis not present

## 2020-09-06 DIAGNOSIS — F4323 Adjustment disorder with mixed anxiety and depressed mood: Secondary | ICD-10-CM | POA: Diagnosis not present

## 2020-09-09 ENCOUNTER — Other Ambulatory Visit (HOSPITAL_COMMUNITY): Payer: Self-pay

## 2020-09-16 ENCOUNTER — Other Ambulatory Visit (HOSPITAL_COMMUNITY): Payer: Self-pay

## 2020-09-16 ENCOUNTER — Other Ambulatory Visit: Payer: Self-pay | Admitting: Rheumatology

## 2020-09-20 ENCOUNTER — Other Ambulatory Visit: Payer: Self-pay | Admitting: Rheumatology

## 2020-09-20 ENCOUNTER — Telehealth: Payer: 59 | Admitting: Physician Assistant

## 2020-09-20 ENCOUNTER — Other Ambulatory Visit (HOSPITAL_COMMUNITY): Payer: Self-pay

## 2020-09-20 DIAGNOSIS — J0140 Acute pansinusitis, unspecified: Secondary | ICD-10-CM | POA: Diagnosis not present

## 2020-09-20 MED ORDER — AMOXICILLIN-POT CLAVULANATE 875-125 MG PO TABS
1.0000 | ORAL_TABLET | Freq: Two times a day (BID) | ORAL | 0 refills | Status: DC
  Filled 2020-09-20: qty 20, 10d supply, fill #0

## 2020-09-20 MED FILL — Empagliflozin-Metformin HCl Tab ER 24HR 10-1000 MG: ORAL | 90 days supply | Qty: 90 | Fill #0 | Status: AC

## 2020-09-20 NOTE — Progress Notes (Signed)
We are sorry that you are not feeling well.  Here is how we plan to help!  Based on what you have shared with me it looks like you have sinusitis.  Sinusitis is inflammation and infection in the sinus cavities of the head.  Based on your presentation I believe you most likely have Acute Bacterial Sinusitis.  This is an infection caused by bacteria and is treated with antibiotics. I have prescribed Augmentin 875mg /125mg  one tablet twice daily with food, for 7 days. You may use an oral decongestant such as Mucinex D or if you have glaucoma or high blood pressure use plain Mucinex. Saline nasal spray help and can safely be used as often as needed for congestion.  If you develop worsening sinus pain, fever or notice severe headache and vision changes, or if symptoms are not better after completion of antibiotic, please schedule an appointment with a health care provider.    Sinus infections are not as easily transmitted as other respiratory infection, however we still recommend that you avoid close contact with loved ones, especially the very young and elderly.  Remember to wash your hands thoroughly throughout the day as this is the number one way to prevent the spread of infection!  Home Care:  Only take medications as instructed by your medical team.  Complete the entire course of an antibiotic.  Do not take these medications with alcohol.  A steam or ultrasonic humidifier can help congestion.  You can place a towel over your head and breathe in the steam from hot water coming from a faucet.  Avoid close contacts especially the very young and the elderly.  Cover your mouth when you cough or sneeze.  Always remember to wash your hands.  Get Help Right Away If:  You develop worsening fever or sinus pain.  You develop a severe head ache or visual changes.  Your symptoms persist after you have completed your treatment plan.  Make sure you  Understand these instructions.  Will watch your  condition.  Will get help right away if you are not doing well or get worse.  Your e-visit answers were reviewed by a board certified advanced clinical practitioner to complete your personal care plan.  Depending on the condition, your plan could have included both over the counter or prescription medications.  If there is a problem please reply  once you have received a response from your provider.  Your safety is important to [place].  If you have drug allergies check your prescription carefully.    You can use MyChart to ask questions about today's visit, request a non-urgent call back, or ask for a work or school excuse for 24 hours related to this e-Visit. If it has been greater than 24 hours you will need to follow up with your provider, or enter a new e-Visit to address those concerns.  You will get an e-mail in the next two days asking about your experience.  I hope that your e-visit has been valuable and will speed your recovery. Thank you for using e-visits.  I provided 6 minutes of non face-to-face time during this encounter for chart review and documentation.

## 2020-09-21 ENCOUNTER — Other Ambulatory Visit (HOSPITAL_COMMUNITY): Payer: Self-pay

## 2020-09-21 ENCOUNTER — Other Ambulatory Visit: Payer: Self-pay | Admitting: Pharmacist

## 2020-09-21 ENCOUNTER — Other Ambulatory Visit: Payer: Self-pay

## 2020-09-21 ENCOUNTER — Ambulatory Visit: Payer: 59 | Attending: Family Medicine | Admitting: Pharmacist

## 2020-09-21 DIAGNOSIS — Z79899 Other long term (current) drug therapy: Secondary | ICD-10-CM

## 2020-09-21 MED ORDER — COSENTYX SENSOREADY (300 MG) 150 MG/ML ~~LOC~~ SOAJ: SUBCUTANEOUS | 5 refills | Status: DC

## 2020-09-21 MED ORDER — COSENTYX SENSOREADY (300 MG) 150 MG/ML ~~LOC~~ SOAJ
SUBCUTANEOUS | 5 refills | Status: DC
  Filled 2020-09-21 – 2020-10-13 (×7): qty 2, 28d supply, fill #0
  Filled 2020-11-09 (×2): qty 2, 28d supply, fill #1

## 2020-09-21 NOTE — Progress Notes (Signed)
   S: Patient presents for review of her specialty medication.  Patient is currently taking Cosentyx for psoriatic arthritis. Patient is managed by Dr. Posey Pronto for this. She has an upcoming appointment with Dr. Posey Pronto on 11/10/2020.  Adherence: denies any missed doses  Efficacy: reports that it is working okay for her but not really well. She may be changing therapy in the future.   Dosing: 150 mg every 14 days  Screening: TB test: completed per patient Hepatitis: completed per patient  Monitoring: S/sx of infection: denies  CBC: monitored by the [facility] (07/05/2020) S/sx of hypersensitivity: denies S/sx of malignancy: denies Latex allergy: does have allergy but has been able to take the medication with only a little bit of redness  O: Lab Results  Component Value Date   WBC 6.5 08/21/2019   HGB 14.2 08/21/2019   HCT 44.0 08/21/2019   MCV 90.5 08/21/2019   PLT 214 08/21/2019      Chemistry      Component Value Date/Time   NA 139 08/21/2019 0834   K 4.5 08/21/2019 0834   CL 104 08/21/2019 0834   CO2 26 08/21/2019 0834   BUN 16 08/21/2019 0834   CREATININE 0.69 08/21/2019 0834      Component Value Date/Time   CALCIUM 9.8 08/21/2019 0834   ALKPHOS 71 04/05/2017 0817   AST 16 08/21/2019 0834   ALT 22 08/21/2019 0834   BILITOT 0.3 08/21/2019 0834       A/P: 1. Medication review: Patient currently on Cosentyx for psoriatic arthritis and tolerating it well with suboptimal disease control. Reviewed medication with her, including the following: Cosentyx is a monoclonal antibody used to treat psoriatic arthritis. Possible adverse effects include hypersensitivity, infections, inflammatory bowel disease and injection site reactions. Patient should avoid live vaccinations unless otherwise instructed. Recommend regular lab work with rheumatologist. No recommendations for any changes at this time.  Benard Halsted, PharmD, [place], [place] [phone]

## 2020-09-22 ENCOUNTER — Other Ambulatory Visit (HOSPITAL_COMMUNITY): Payer: Self-pay

## 2020-09-23 ENCOUNTER — Other Ambulatory Visit (HOSPITAL_COMMUNITY): Payer: Self-pay

## 2020-09-24 ENCOUNTER — Other Ambulatory Visit (HOSPITAL_COMMUNITY): Payer: Self-pay

## 2020-09-25 DIAGNOSIS — G4733 Obstructive sleep apnea (adult) (pediatric): Secondary | ICD-10-CM | POA: Diagnosis not present

## 2020-09-27 DIAGNOSIS — F4323 Adjustment disorder with mixed anxiety and depressed mood: Secondary | ICD-10-CM | POA: Diagnosis not present

## 2020-09-28 ENCOUNTER — Other Ambulatory Visit (HOSPITAL_COMMUNITY): Payer: Self-pay

## 2020-09-30 DIAGNOSIS — H2513 Age-related nuclear cataract, bilateral: Secondary | ICD-10-CM | POA: Diagnosis not present

## 2020-09-30 DIAGNOSIS — H40033 Anatomical narrow angle, bilateral: Secondary | ICD-10-CM | POA: Diagnosis not present

## 2020-09-30 DIAGNOSIS — E119 Type 2 diabetes mellitus without complications: Secondary | ICD-10-CM | POA: Diagnosis not present

## 2020-09-30 DIAGNOSIS — H40013 Open angle with borderline findings, low risk, bilateral: Secondary | ICD-10-CM | POA: Diagnosis not present

## 2020-09-30 NOTE — Progress Notes (Signed)
[facility] [address] [place] Phone: [phone] Subjective:   I Tabitha [name] am serving as a Education administrator for Dr. Hulan Saas.  This visit occurred during the SARS-CoV-2 public health emergency.  Safety protocols were in place, including screening questions prior to the visit, additional usage of staff PPE, and extensive cleaning of exam room while observing appropriate contact time as indicated for disinfecting solutions.   I'm seeing this patient by the request  of:  Hoyt Koch, MD  CC: Neck and back pain follow-up  IPJ:[id number]  Tabitha [name] is a 55 y.o. female coming in with complaint of back and neck pain. OMT 08/19/2020. Patient states she is doing well. Neck is always painful.  Noticed more tightness recently.  Patient has been in the process of potentially moving recently.  Not been quite as active as usual.  Patient is also not on her Cosentyx and is awaiting approval.  Medications patient has been prescribed: None          Reviewed prior external information including notes and imaging from previsou exam, outside providers and external EMR if available.   As well as notes that were available from care everywhere and other healthcare systems.  Past medical history, social, surgical and family history all reviewed in electronic medical record.  No pertanent information unless stated regarding to the chief complaint.   Past Medical History:  Diagnosis Date  . Arthritis   . Asthma   . De Quervain's disease (tenosynovitis)   . Dermatophytosis, scalp   . Diabetes mellitus   . Hypercholesteremia   . Obesity   . Polycystic ovarian disease   . Psoriatic arthritis ([place])   . Wears glasses     Allergies  Allergen Reactions  . Chocolate Anaphylaxis  . Food     chocolate  . Statins     Intolerant  lft elevation  . Latex Rash     Review of Systems:  No headache, visual changes, nausea, vomiting,  diarrhea, constipation, dizziness, abdominal pain, skin rash, fevers, chills, night sweats, weight loss, swollen lymph nodes,, chest pain, shortness of breath, mood changes. POSITIVE muscle aches, body aches, joint swelling  Objective  Blood pressure 110/64, pulse 71, height 5\' 2"  (1.575 m), weight 229 lb (103.9 kg), SpO2 99 %.   General: No apparent distress alert and oriented x3 mood and affect normal, dressed appropriately.  HEENT: Pupils equal, extraocular movements intact  Respiratory: Patient's speak in full sentences and does not appear short of breath  Cardiovascular: No lower extremity edema, non tender, no erythema  Gait normal with good balance and coordination. y.  Back -neck exam does show some mild loss of lordosis.  Poor core strength noted.  Tightness noted with FABER test.  Patient does have tightness of the neck as well  Osteopathic findings  C2 flexed rotated and side bent right T9 extended rotated and side bent right inhaled rib T8 extended rotated and side bent left L2 flexed rotated and side bent right Sacrum right on right       Assessment and Plan:  Low back pain Chronic problem with mild exacerbation.  Patient is on the turmeric, we discussed the topical anti-inflammatories, has tried Celebrex in the past as well.  Discussed core strengthening.  Stays active.  Encouraged to continue patient to work on weight loss.  Follow-up with me again in 6 to 8 weeks    Nonallopathic problems  Decision today to treat with  OMT was based on Physical Exam  After verbal consent patient was treated with HVLA, ME, FPR techniques in cervical, rib, thoracic, lumbar, and sacral  areas  Patient tolerated the procedure well with improvement in symptoms  Patient given exercises, stretches and lifestyle modifications  See medications in patient instructions if given  Patient will follow up in 4-8 weeks      The above documentation has been reviewed and is accurate and  complete Lyndal Pulley, DO       Note: This dictation was prepared with Dragon dictation along with smaller phrase technology. Any transcriptional errors that result from this process are unintentional.

## 2020-10-01 ENCOUNTER — Other Ambulatory Visit: Payer: Self-pay

## 2020-10-01 ENCOUNTER — Encounter: Payer: Self-pay | Admitting: Family Medicine

## 2020-10-01 ENCOUNTER — Ambulatory Visit: Payer: 59 | Admitting: Family Medicine

## 2020-10-01 ENCOUNTER — Other Ambulatory Visit (HOSPITAL_COMMUNITY): Payer: Self-pay

## 2020-10-01 VITALS — BP 110/64 | HR 71 | Ht 62.0 in | Wt 229.0 lb

## 2020-10-01 DIAGNOSIS — M9908 Segmental and somatic dysfunction of rib cage: Secondary | ICD-10-CM

## 2020-10-01 DIAGNOSIS — M9901 Segmental and somatic dysfunction of cervical region: Secondary | ICD-10-CM | POA: Diagnosis not present

## 2020-10-01 DIAGNOSIS — M9902 Segmental and somatic dysfunction of thoracic region: Secondary | ICD-10-CM | POA: Diagnosis not present

## 2020-10-01 DIAGNOSIS — M545 Low back pain, unspecified: Secondary | ICD-10-CM

## 2020-10-01 DIAGNOSIS — M9904 Segmental and somatic dysfunction of sacral region: Secondary | ICD-10-CM | POA: Diagnosis not present

## 2020-10-01 DIAGNOSIS — G8929 Other chronic pain: Secondary | ICD-10-CM | POA: Diagnosis not present

## 2020-10-01 DIAGNOSIS — M9903 Segmental and somatic dysfunction of lumbar region: Secondary | ICD-10-CM | POA: Diagnosis not present

## 2020-10-01 NOTE — Patient Instructions (Addendum)
Good to see you Try to get back on routine See me again in 6 weeks

## 2020-10-01 NOTE — Assessment & Plan Note (Signed)
Chronic problem with mild exacerbation.  Patient is on the turmeric, we discussed the topical anti-inflammatories, has tried Celebrex in the past as well.  Discussed core strengthening.  Stays active.  Encouraged to continue patient to work on weight loss.  Follow-up with me again in 6 to 8 weeks

## 2020-10-05 ENCOUNTER — Other Ambulatory Visit (HOSPITAL_COMMUNITY): Payer: Self-pay

## 2020-10-07 ENCOUNTER — Other Ambulatory Visit (HOSPITAL_COMMUNITY): Payer: Self-pay

## 2020-10-12 ENCOUNTER — Ambulatory Visit: Payer: 59 | Admitting: Internal Medicine

## 2020-10-12 ENCOUNTER — Other Ambulatory Visit: Payer: Self-pay

## 2020-10-12 ENCOUNTER — Other Ambulatory Visit (HOSPITAL_COMMUNITY): Payer: Self-pay

## 2020-10-12 ENCOUNTER — Encounter: Payer: Self-pay | Admitting: Internal Medicine

## 2020-10-12 VITALS — BP 130/84 | HR 61 | Temp 98.0°F | Resp 18 | Ht 62.0 in | Wt 227.6 lb

## 2020-10-12 DIAGNOSIS — R002 Palpitations: Secondary | ICD-10-CM | POA: Diagnosis not present

## 2020-10-12 NOTE — Assessment & Plan Note (Signed)
EKG done in office which was normal and unchanged. Offered holter monitor which she declined and will let us know if this recurs. We talked about how at HR 120-130 which she was having it is difficult for apple watch to tell SVT from A fib.

## 2020-10-12 NOTE — Patient Instructions (Signed)
The EKG is normal today, let us know if this comes back.

## 2020-10-12 NOTE — Progress Notes (Signed)
   Subjective:   Patient ID: Tabitha [name], female    DOB: 1966/02/15, 55 y.o.   MRN: [id number]  HPI The patient is a 55 YO female coming in for episode of fast HR which happened in the middle of the night. She had several alcoholic drinks earlier that night which was not usual for her. She was at a conference and also more physically active than normal. Woke up 2am to urinate and when getting back in bed felt fast HR and fluttery. Put on apple watch and did EKG which said it could be A fib. She has not had any other episodes like this or since. She did holter monitor back in 2020 for palpitations which showed sinus with a few PACs and PVCs. Denies other changes in diet or exercise or caffeine recently. She did valsalva twice and the HR went down to 80s and was back to normal in the morning.   Review of Systems  Constitutional: Negative.   HENT: Negative.   Eyes: Negative.   Respiratory: Negative for cough, chest tightness and shortness of breath.   Cardiovascular: Positive for palpitations. Negative for chest pain and leg swelling.  Gastrointestinal: Negative for abdominal distention, abdominal pain, constipation, diarrhea, nausea and vomiting.  Musculoskeletal: Negative.   Skin: Negative.   Neurological: Negative.   Psychiatric/Behavioral: Negative.     Objective:  Physical Exam Constitutional:      Appearance: She is well-developed.  HENT:     Head: Normocephalic and atraumatic.  Cardiovascular:     Rate and Rhythm: Normal rate and regular rhythm.  Pulmonary:     Effort: Pulmonary effort is normal. No respiratory distress.     Breath sounds: Normal breath sounds. No wheezing or rales.  Abdominal:     General: Bowel sounds are normal. There is no distension.     Palpations: Abdomen is soft.     Tenderness: There is no abdominal tenderness. There is no rebound.  Musculoskeletal:     Cervical back: Normal range of motion.  Skin:    General: Skin is warm and dry.  Neurological:      Mental Status: She is alert and oriented to person, place, and time.     Coordination: Coordination normal.     Vitals:   10/12/20 0823  BP: 130/84  Pulse: 61  Resp: 18  Temp: 98 F (36.7 C)  TempSrc: Oral  SpO2: 98%  Weight: 227 lb 9.6 oz (103.2 kg)  Height: 5\' 2"  (1.575 m)   EKG: Rate 60, axis normal, interval normal, sinus, no st or t wave changes, no significant change compared to prior 2020   This visit occurred during the SARS-CoV-2 public health emergency.  Safety protocols were in place, including screening questions prior to the visit, additional usage of staff PPE, and extensive cleaning of exam room while observing appropriate contact time as indicated for disinfecting solutions.   Assessment & Plan:

## 2020-10-13 ENCOUNTER — Other Ambulatory Visit (HOSPITAL_COMMUNITY): Payer: Self-pay

## 2020-10-14 ENCOUNTER — Other Ambulatory Visit (HOSPITAL_COMMUNITY): Payer: Self-pay

## 2020-10-14 DIAGNOSIS — F4323 Adjustment disorder with mixed anxiety and depressed mood: Secondary | ICD-10-CM | POA: Diagnosis not present

## 2020-10-16 ENCOUNTER — Other Ambulatory Visit (HOSPITAL_COMMUNITY): Payer: Self-pay

## 2020-10-16 ENCOUNTER — Other Ambulatory Visit: Payer: Self-pay

## 2020-10-16 MED FILL — Fluticasone Propionate Nasal Susp 50 MCG/ACT: NASAL | 30 days supply | Qty: 16 | Fill #0 | Status: AC

## 2020-10-16 MED FILL — Folic Acid Tab 1 MG: ORAL | 90 days supply | Qty: 180 | Fill #0 | Status: AC

## 2020-10-19 ENCOUNTER — Other Ambulatory Visit (HOSPITAL_COMMUNITY): Payer: Self-pay

## 2020-10-21 ENCOUNTER — Ambulatory Visit: Payer: 59

## 2020-10-21 NOTE — Progress Notes (Signed)
   Covid-19 Vaccination Clinic  Name:  Tabitha [name]    MRN: [id number] DOB: [birth year]  10/21/2020  [name] was observed post Covid-19 immunization for 15 minutes without incident. She was provided with Vaccine Information Sheet and instruction to access the V-Safe system.   [name] was instructed to call 911 with any severe reactions post vaccine: Marland Kitchen Difficulty breathing  . Swelling of face and throat  . A fast heartbeat  . A bad rash all over body  . Dizziness and weakness

## 2020-10-25 ENCOUNTER — Other Ambulatory Visit (HOSPITAL_BASED_OUTPATIENT_CLINIC_OR_DEPARTMENT_OTHER): Payer: Self-pay

## 2020-10-25 DIAGNOSIS — G4733 Obstructive sleep apnea (adult) (pediatric): Secondary | ICD-10-CM | POA: Diagnosis not present

## 2020-10-25 MED ORDER — PFIZER-BIONT COVID-19 VAC-TRIS 30 MCG/0.3ML IM SUSP
INTRAMUSCULAR | 0 refills | Status: DC
  Filled 2020-10-25: qty 0.3, 1d supply, fill #0

## 2020-11-02 MED FILL — Liraglutide Soln Pen-injector 18 MG/3ML (6 MG/ML): SUBCUTANEOUS | 30 days supply | Qty: 9 | Fill #0 | Status: AC

## 2020-11-03 ENCOUNTER — Other Ambulatory Visit (HOSPITAL_COMMUNITY): Payer: Self-pay

## 2020-11-04 ENCOUNTER — Other Ambulatory Visit (HOSPITAL_COMMUNITY): Payer: Self-pay

## 2020-11-09 ENCOUNTER — Other Ambulatory Visit (HOSPITAL_COMMUNITY): Payer: Self-pay

## 2020-11-10 DIAGNOSIS — M159 Polyosteoarthritis, unspecified: Secondary | ICD-10-CM | POA: Diagnosis not present

## 2020-11-10 DIAGNOSIS — L409 Psoriasis, unspecified: Secondary | ICD-10-CM | POA: Diagnosis not present

## 2020-11-10 DIAGNOSIS — Z79899 Other long term (current) drug therapy: Secondary | ICD-10-CM | POA: Diagnosis not present

## 2020-11-10 DIAGNOSIS — L4050 Arthropathic psoriasis, unspecified: Secondary | ICD-10-CM | POA: Diagnosis not present

## 2020-11-11 ENCOUNTER — Other Ambulatory Visit (HOSPITAL_COMMUNITY): Payer: Self-pay

## 2020-11-11 ENCOUNTER — Other Ambulatory Visit: Payer: Self-pay

## 2020-11-11 MED ORDER — COSENTYX SENSOREADY PEN 150 MG/ML ~~LOC~~ SOAJ: SUBCUTANEOUS | 11 refills | Status: DC

## 2020-11-11 MED FILL — Fluticasone Propionate Nasal Susp 50 MCG/ACT: NASAL | 30 days supply | Qty: 16 | Fill #1 | Status: AC

## 2020-11-12 ENCOUNTER — Other Ambulatory Visit (HOSPITAL_COMMUNITY): Payer: Self-pay

## 2020-11-12 ENCOUNTER — Other Ambulatory Visit: Payer: Self-pay

## 2020-11-12 MED ORDER — METHOTREXATE SODIUM CHEMO INJECTION 50 MG/2ML
INTRAMUSCULAR | 1 refills | Status: DC
  Filled 2020-11-12: qty 12, 84d supply, fill #0

## 2020-11-13 ENCOUNTER — Other Ambulatory Visit (HOSPITAL_COMMUNITY): Payer: Self-pay

## 2020-11-16 NOTE — Progress Notes (Signed)
[facility] [address] [place] Phone: [phone] Subjective:   I Tabitha [name] am serving as a Education administrator for Dr. Hulan Saas.  This visit occurred during the SARS-CoV-2 public health emergency.  Safety protocols were in place, including screening questions prior to the visit, additional usage of staff PPE, and extensive cleaning of exam room while observing appropriate contact time as indicated for disinfecting solutions.   I'm seeing this patient by the request  of:  Tabitha Koch, MD  CC: neck and back pain   [id number]  Tabitha [name] is a 55 y.o. female coming in with complaint of back and neck pain. OMT 10/01/2020. Patient states she is doing well and that her neck is always in pain. States she has been exercising and her muscle recover is taking longer than expected.  Patient has been working out a little bit more going 6 days a week for nearly an hour.  Continuing to attempt to lose weight.  Medications patient has been prescribed: None Taking:      Seen rheum and started Taltz    Past Medical History:  Diagnosis Date  . Arthritis   . Asthma   . De Quervain's disease (tenosynovitis)   . Dermatophytosis, scalp   . Diabetes mellitus   . Hypercholesteremia   . Obesity   . Polycystic ovarian disease   . Psoriatic arthritis ([place])   . Wears glasses     Allergies  Allergen Reactions  . Chocolate Anaphylaxis  . Food     chocolate  . Statins     Intolerant  lft elevation  . Latex Rash     Review of Systems:  No headache, visual changes, nausea, vomiting, diarrhea, constipation, dizziness, abdominal pain, skin rash, fevers, chills, night sweats, weight loss, swollen lymph nodes,chest pain, shortness of breath, mood changes. POSITIVE muscle aches, body aches, intermittent joint swelling  Objective  Blood pressure 120/78, pulse 65, height 5\' 2"  (1.575 m), weight 234 lb (106.1 kg), SpO2 99 %.   General: No  apparent distress alert and oriented x3 mood and affect normal, dressed appropriately.  HEENT: Pupils equal, extraocular movements intact patient does have a mild rash noted on the chest and her face. Respiratory: Patient's speak in full sentences and does not appear short of breath  Cardiovascular: No lower extremity edema, non tender, no erythema  Gait normal with good balance and coordination.  Back -low back exam does have significant tightness over the sacroiliac joints bilaterally with mild swelling.  Tightness noted with FABER test bilaterally and does have tightness straight leg but no true radicular symptoms.  Patient does have some limited flexion and extension noted today.  Osteopathic findings  C2 flexed rotated and side bent right T8 extended rotated and side bent right inhaled rib L2 flexed rotated and side bent right Sacrum right on right     Assessment and Plan:  Psoriatic arthritis ([place]) Concerned that patient may be having a mild flare.  Recently saw her rheumatologist.  In thinking of changing and will discuss with primary care provider.  Attempted osteopathic manipulation with some good resolution of some of the discomfort and pain.  We discussed with patient about different diet changes that I think will be beneficial especially eating after working out with patient increasing her workouts to 6 days a week.  Discussed staying hydrated.  Follow-up with me again in 6 weeks  Low back pain Chronic and multifactorial.  Discussed which activities to  doing which wants to avoid.  Increase activity slowly.  Discussed icing regimen.  Patient will follow up with me again in 6 weeks.  Responds usually to manipulation but do feel that underlying psoriatic arthritis could be playing a role.    Nonallopathic problems  Decision today to treat with OMT was based on Physical Exam  After verbal consent patient was treated with HVLA, ME, FPR techniques in cervical, rib, thoracic,  lumbar, and sacral  areas  Patient tolerated the procedure well with improvement in symptoms  Patient given exercises, stretches and lifestyle modifications  See medications in patient instructions if given  Patient will follow up in 4-8 weeks      The above documentation has been reviewed and is accurate and complete Lyndal Pulley, DO       Note: This dictation was prepared with Dragon dictation along with smaller phrase technology. Any transcriptional errors that result from this process are unintentional.

## 2020-11-17 ENCOUNTER — Ambulatory Visit: Payer: 59 | Admitting: Family Medicine

## 2020-11-17 ENCOUNTER — Encounter: Payer: Self-pay | Admitting: Family Medicine

## 2020-11-17 ENCOUNTER — Other Ambulatory Visit: Payer: Self-pay

## 2020-11-17 VITALS — BP 120/78 | HR 65 | Ht 62.0 in | Wt 234.0 lb

## 2020-11-17 DIAGNOSIS — M9902 Segmental and somatic dysfunction of thoracic region: Secondary | ICD-10-CM

## 2020-11-17 DIAGNOSIS — M9904 Segmental and somatic dysfunction of sacral region: Secondary | ICD-10-CM | POA: Diagnosis not present

## 2020-11-17 DIAGNOSIS — G8929 Other chronic pain: Secondary | ICD-10-CM | POA: Diagnosis not present

## 2020-11-17 DIAGNOSIS — L4050 Arthropathic psoriasis, unspecified: Secondary | ICD-10-CM | POA: Diagnosis not present

## 2020-11-17 DIAGNOSIS — M9901 Segmental and somatic dysfunction of cervical region: Secondary | ICD-10-CM

## 2020-11-17 DIAGNOSIS — M9903 Segmental and somatic dysfunction of lumbar region: Secondary | ICD-10-CM

## 2020-11-17 DIAGNOSIS — M9908 Segmental and somatic dysfunction of rib cage: Secondary | ICD-10-CM | POA: Diagnosis not present

## 2020-11-17 DIAGNOSIS — M545 Low back pain, unspecified: Secondary | ICD-10-CM

## 2020-11-17 NOTE — Assessment & Plan Note (Signed)
Concerned that patient may be having a mild flare.  Recently saw her rheumatologist.  In thinking of changing and will discuss with primary care provider.  Attempted osteopathic manipulation with some good resolution of some of the discomfort and pain.  We discussed with patient about different diet changes that I think will be beneficial especially eating after working out with patient increasing her workouts to 6 days a week.  Discussed staying hydrated.  Follow-up with me again in 6 weeks

## 2020-11-17 NOTE — Patient Instructions (Addendum)
Good to see you Write me if little things continue to give you trouble Eat within 30 minutes of working out See me again in 4-5 weeks

## 2020-11-17 NOTE — Assessment & Plan Note (Signed)
Chronic and multifactorial.  Discussed which activities to doing which wants to avoid.  Increase activity slowly.  Discussed icing regimen.  Patient will follow up with me again in 6 weeks.  Responds usually to manipulation but do feel that underlying psoriatic arthritis could be playing a role.

## 2020-11-19 DIAGNOSIS — G4733 Obstructive sleep apnea (adult) (pediatric): Secondary | ICD-10-CM | POA: Diagnosis not present

## 2020-11-22 ENCOUNTER — Other Ambulatory Visit (HOSPITAL_BASED_OUTPATIENT_CLINIC_OR_DEPARTMENT_OTHER): Payer: Self-pay

## 2020-11-22 DIAGNOSIS — F338 Other recurrent depressive disorders: Secondary | ICD-10-CM | POA: Diagnosis not present

## 2020-11-22 DIAGNOSIS — R41840 Attention and concentration deficit: Secondary | ICD-10-CM | POA: Diagnosis not present

## 2020-11-22 DIAGNOSIS — F419 Anxiety disorder, unspecified: Secondary | ICD-10-CM | POA: Diagnosis not present

## 2020-11-22 MED ORDER — VYVANSE 20 MG PO CAPS
ORAL_CAPSULE | ORAL | 0 refills | Status: DC
  Filled 2020-11-22: qty 30, 30d supply, fill #0

## 2020-11-23 ENCOUNTER — Encounter: Payer: Self-pay | Admitting: Internal Medicine

## 2020-11-25 DIAGNOSIS — G4733 Obstructive sleep apnea (adult) (pediatric): Secondary | ICD-10-CM | POA: Diagnosis not present

## 2020-11-26 ENCOUNTER — Other Ambulatory Visit (HOSPITAL_COMMUNITY): Payer: Self-pay

## 2020-11-26 ENCOUNTER — Other Ambulatory Visit: Payer: Self-pay

## 2020-11-26 ENCOUNTER — Encounter: Payer: Self-pay | Admitting: Internal Medicine

## 2020-11-26 ENCOUNTER — Ambulatory Visit (INDEPENDENT_AMBULATORY_CARE_PROVIDER_SITE_OTHER): Payer: 59 | Admitting: Internal Medicine

## 2020-11-26 VITALS — BP 148/80 | HR 82 | Temp 99.0°F | Ht 62.0 in | Wt 223.4 lb

## 2020-11-26 DIAGNOSIS — Z0000 Encounter for general adult medical examination without abnormal findings: Secondary | ICD-10-CM

## 2020-11-26 DIAGNOSIS — E1169 Type 2 diabetes mellitus with other specified complication: Secondary | ICD-10-CM

## 2020-11-26 DIAGNOSIS — E0865 Diabetes mellitus due to underlying condition with hyperglycemia: Secondary | ICD-10-CM

## 2020-11-26 DIAGNOSIS — L4050 Arthropathic psoriasis, unspecified: Secondary | ICD-10-CM | POA: Diagnosis not present

## 2020-11-26 DIAGNOSIS — E785 Hyperlipidemia, unspecified: Secondary | ICD-10-CM | POA: Diagnosis not present

## 2020-11-26 DIAGNOSIS — R21 Rash and other nonspecific skin eruption: Secondary | ICD-10-CM | POA: Diagnosis not present

## 2020-11-26 MED ORDER — PIMECROLIMUS 1 % EX CREA
TOPICAL_CREAM | Freq: Two times a day (BID) | CUTANEOUS | 3 refills | Status: DC
  Filled 2020-11-26: qty 100, 30d supply, fill #0
  Filled 2020-12-16 – 2021-01-04 (×2): qty 100, 30d supply, fill #1

## 2020-11-26 NOTE — Assessment & Plan Note (Signed)
Taking methotrexate. With rash that could be related. Checking high sensitivity CRP.

## 2020-11-26 NOTE — Progress Notes (Signed)
   Subjective:   Patient ID: Tabitha [name], female    DOB: 08-18-1965, 55 y.o.   MRN: [id number]  HPI The patient is a 55 YO female coming in for physical. Also having rash for last month or so.  PMH, [facility], social history reviewed and updated  Review of Systems  Constitutional: Negative.   HENT: Negative.    Eyes: Negative.   Respiratory:  Negative for cough, chest tightness and shortness of breath.   Cardiovascular:  Negative for chest pain, palpitations and leg swelling.  Gastrointestinal:  Negative for abdominal distention, abdominal pain, constipation, diarrhea, nausea and vomiting.  Musculoskeletal: Negative.   Skin: Negative.   Neurological: Negative.   Psychiatric/Behavioral: Negative.     Objective:  Physical Exam Constitutional:      Appearance: She is well-developed. She is obese.  HENT:     Head: Normocephalic and atraumatic.  Cardiovascular:     Rate and Rhythm: Normal rate and regular rhythm.  Pulmonary:     Effort: Pulmonary effort is normal. No respiratory distress.     Breath sounds: Normal breath sounds. No wheezing or rales.  Abdominal:     General: Bowel sounds are normal. There is no distension.     Palpations: Abdomen is soft.     Tenderness: There is no abdominal tenderness. There is no rebound.  Musculoskeletal:     Cervical back: Normal range of motion.  Skin:    General: Skin is warm and dry.  Neurological:     Mental Status: She is alert and oriented to person, place, and time.     Coordination: Coordination normal.    Vitals:   11/26/20 1542  BP: (!) 148/80  Pulse: 82  Temp: 99 F (37.2 C)  TempSrc: Oral  SpO2: 97%  Weight: 223 lb 6.4 oz (101.3 kg)  Height: 5\' 2"  (1.575 m)    This visit occurred during the SARS-CoV-2 public health emergency.  Safety protocols were in place, including screening questions prior to the visit, additional usage of staff PPE, and extensive cleaning of exam room while observing appropriate contact time as  indicated for disinfecting solutions.   Assessment & Plan:

## 2020-11-26 NOTE — Assessment & Plan Note (Addendum)
Off medication currently and following with Dr. Buddy Duty for HgA1c and medications. Checking microalbumin to creatinine ratio and lipid panel. Foot exam done.

## 2020-11-26 NOTE — Assessment & Plan Note (Signed)
Checking CRP and rx elidel cream.

## 2020-11-26 NOTE — Assessment & Plan Note (Signed)
Flu shot yearly. Covid-19 4 shots. Shingrix declines for today. Tetanus up to date. Colonoscopy up to date. Mammogram up to date, pap smear up to date. Counseled about sun safety and mole surveillance. Counseled about the dangers of distracted driving. Given 10 year screening recommendations.

## 2020-11-26 NOTE — Assessment & Plan Note (Signed)
Weight stable and talked about need for diet and exercise changes to help.

## 2020-11-26 NOTE — Patient Instructions (Addendum)
We have sent in  cream to use on the rash.  You can take zyrtec (cetirizine) 3-4 times a day for itching.

## 2020-11-26 NOTE — Assessment & Plan Note (Signed)
Checking lipid panel taking fish oil currently. Adjust as needed.

## 2020-11-27 ENCOUNTER — Other Ambulatory Visit (HOSPITAL_COMMUNITY): Payer: Self-pay

## 2020-11-27 LAB — LIPID PANEL
Cholesterol: 196 mg/dL (ref <200)
HDL: 59 mg/dL (ref >=50)
LDL Cholesterol (Calc): 106 mg/dL (calc) — ABNORMAL HIGH
Non-HDL Cholesterol (Calc): 137 mg/dL (calc) — ABNORMAL HIGH (ref <130)
Total CHOL/HDL Ratio: 3.3 (calc) (ref <5.0)
Triglycerides: 185 mg/dL — ABNORMAL HIGH (ref <150)

## 2020-11-27 LAB — MICROALBUMIN / CREATININE URINE RATIO
Creatinine, Urine: 31 mg/dL (ref 20–275)
Microalb Creat Ratio: 10 mcg/mg creat (ref <30)
Microalb, Ur: 0.3 mg/dL

## 2020-11-27 LAB — HIGH SENSITIVITY CRP: hs-CRP: 5.5 mg/L — ABNORMAL HIGH

## 2020-11-29 ENCOUNTER — Other Ambulatory Visit (HOSPITAL_COMMUNITY): Payer: Self-pay

## 2020-11-29 DIAGNOSIS — R21 Rash and other nonspecific skin eruption: Secondary | ICD-10-CM | POA: Diagnosis not present

## 2020-11-29 DIAGNOSIS — E119 Type 2 diabetes mellitus without complications: Secondary | ICD-10-CM | POA: Diagnosis not present

## 2020-11-29 DIAGNOSIS — Z7984 Long term (current) use of oral hypoglycemic drugs: Secondary | ICD-10-CM | POA: Diagnosis not present

## 2020-11-30 ENCOUNTER — Other Ambulatory Visit (HOSPITAL_BASED_OUTPATIENT_CLINIC_OR_DEPARTMENT_OTHER): Payer: Self-pay

## 2020-11-30 ENCOUNTER — Other Ambulatory Visit (HOSPITAL_COMMUNITY): Payer: Self-pay

## 2020-11-30 ENCOUNTER — Encounter: Payer: Self-pay | Admitting: Internal Medicine

## 2020-12-01 ENCOUNTER — Other Ambulatory Visit (HOSPITAL_COMMUNITY): Payer: Self-pay

## 2020-12-02 ENCOUNTER — Ambulatory Visit: Payer: 59 | Attending: Family Medicine | Admitting: Pharmacist

## 2020-12-02 ENCOUNTER — Other Ambulatory Visit: Payer: Self-pay | Admitting: Pharmacist

## 2020-12-02 ENCOUNTER — Telehealth: Payer: Self-pay | Admitting: Pharmacist

## 2020-12-02 ENCOUNTER — Other Ambulatory Visit: Payer: Self-pay

## 2020-12-02 ENCOUNTER — Other Ambulatory Visit (HOSPITAL_COMMUNITY): Payer: Self-pay

## 2020-12-02 DIAGNOSIS — Z79899 Other long term (current) drug therapy: Secondary | ICD-10-CM

## 2020-12-02 MED ORDER — TALTZ 80 MG/ML ~~LOC~~ SOAJ
SUBCUTANEOUS | 0 refills | Status: DC
  Filled 2020-12-02: qty 2, 28d supply, fill #0

## 2020-12-02 MED ORDER — TALTZ 80 MG/ML ~~LOC~~ SOAJ
SUBCUTANEOUS | 5 refills | Status: DC
  Filled 2020-12-02 – 2021-01-04 (×2): qty 1, fill #0
  Filled 2021-01-05 – 2021-01-06 (×2): qty 1, 28d supply, fill #0
  Filled 2021-01-28: qty 1, 28d supply, fill #1
  Filled 2021-02-25: qty 1, 28d supply, fill #2
  Filled 2021-03-28: qty 1, 28d supply, fill #3
  Filled 2021-04-21: qty 1, 28d supply, fill #4
  Filled 2021-05-18 – 2021-05-30 (×4): qty 1, 28d supply, fill #5

## 2020-12-02 MED ORDER — TALTZ 80 MG/ML ~~LOC~~ SOAJ
SUBCUTANEOUS | 5 refills | Status: DC
  Filled 2020-12-02: qty 1, 28d supply, fill #0

## 2020-12-02 MED ORDER — TALTZ 80 MG/ML ~~LOC~~ SOAJ
SUBCUTANEOUS | 0 refills | Status: DC
  Filled 2020-12-02 – 2020-12-08 (×4): qty 2, 28d supply, fill #0

## 2020-12-02 NOTE — Progress Notes (Signed)
   S: Patient presents today for review of their specialty medication.   Patient is currently prescribed Taltz for psoriatic arthritis. Patient is managed by Dr. Posey Pronto  for this.   Dosing: Psoriatic arthritis: SubQ: 160 mg once, followed by 80 mg every 4 weeks; may administer alone or in combination with conventional disease-modifying antirheumatic drugs (eg, methotrexate). Note: For psoriatic arthritis patients with coexisting moderate to severe plaque psoriasis, use the dosing regimen for plaque psoriasis.  Adherence: has not yet started   Efficacy: has not yet started   Monitoring:  S/sx infection: has not yet started  Injection site reactions: has not yet started  S/sx of IBD: has not yet started  S/sx of hypersensitivity reaction:  has not yet started   Current adverse effects: has not yet started    O:     Lab Results  Component Value Date   WBC 6.5 08/21/2019   HGB 14.2 08/21/2019   HCT 44.0 08/21/2019   MCV 90.5 08/21/2019   PLT 214 08/21/2019      Chemistry      Component Value Date/Time   NA 139 08/21/2019 0834   K 4.5 08/21/2019 0834   CL 104 08/21/2019 0834   CO2 26 08/21/2019 0834   BUN 16 08/21/2019 0834   CREATININE 0.69 08/21/2019 0834      Component Value Date/Time   CALCIUM 9.8 08/21/2019 0834   ALKPHOS 71 04/05/2017 0817   AST 16 08/21/2019 0834   ALT 22 08/21/2019 0834   BILITOT 0.3 08/21/2019 0834       A/P: 1. Medication review: patient is about to start [place] for psoriatic arthritis. Reviewed the medication with the patient, including the following: Donnetta Hail is a medication used to treat ankylosing spondylitis, plaque psoriasis, and psoriatic arthritis. Subcutaneous: Allow to reach room temperature prior to injection (30 minutes). Do not shake. Inject full amount into the upper arms, thighs or any quadrant of the abdomen; administer each injection at a different anatomic location than a previous injection and avoid areas where the skin is  tender, bruised, erythematous, indurated, or affected by psoriasis. Administration in the upper, outer arm may be performed by a caregiver or health care provider. Ixekizumab is intended for use under the guidance and supervision of a physician; may be self-injected by the patient following proper training in SubQ injection technique. Possible adverse effects include neutropenia, antibody development, increased risk of infection, injection site reaction, hypersensitivity reactions, and inflammatory bowel disease. Avoid live vaccinations. No recommendations for any changes at this time.  Benard Halsted, PharmD, [place], [place] [phone]

## 2020-12-02 NOTE — Telephone Encounter (Signed)
Called patient to schedule an appointment for the [facility] [facility]. I was unable to reach the patient so I left a HIPAA-compliant message requesting that the patient return my call.   Luke Van Ausdall, PharmD, BCACP, CPP Clinical Pharmacist [facility] [phone]  

## 2020-12-06 ENCOUNTER — Other Ambulatory Visit (HOSPITAL_COMMUNITY): Payer: Self-pay

## 2020-12-07 ENCOUNTER — Other Ambulatory Visit (HOSPITAL_COMMUNITY): Payer: Self-pay

## 2020-12-08 ENCOUNTER — Other Ambulatory Visit (HOSPITAL_COMMUNITY): Payer: Self-pay

## 2020-12-09 ENCOUNTER — Other Ambulatory Visit (HOSPITAL_COMMUNITY): Payer: Self-pay

## 2020-12-15 ENCOUNTER — Ambulatory Visit: Payer: 59 | Admitting: Family Medicine

## 2020-12-15 ENCOUNTER — Encounter: Payer: Self-pay | Admitting: Family Medicine

## 2020-12-15 ENCOUNTER — Other Ambulatory Visit (HOSPITAL_BASED_OUTPATIENT_CLINIC_OR_DEPARTMENT_OTHER): Payer: Self-pay

## 2020-12-15 ENCOUNTER — Other Ambulatory Visit: Payer: Self-pay

## 2020-12-15 VITALS — BP 132/70 | HR 76 | Ht 62.0 in | Wt 237.0 lb

## 2020-12-15 DIAGNOSIS — G8929 Other chronic pain: Secondary | ICD-10-CM | POA: Diagnosis not present

## 2020-12-15 DIAGNOSIS — M9901 Segmental and somatic dysfunction of cervical region: Secondary | ICD-10-CM

## 2020-12-15 DIAGNOSIS — M9902 Segmental and somatic dysfunction of thoracic region: Secondary | ICD-10-CM

## 2020-12-15 DIAGNOSIS — M9904 Segmental and somatic dysfunction of sacral region: Secondary | ICD-10-CM | POA: Diagnosis not present

## 2020-12-15 DIAGNOSIS — M545 Low back pain, unspecified: Secondary | ICD-10-CM | POA: Diagnosis not present

## 2020-12-15 DIAGNOSIS — M9908 Segmental and somatic dysfunction of rib cage: Secondary | ICD-10-CM | POA: Diagnosis not present

## 2020-12-15 DIAGNOSIS — M9903 Segmental and somatic dysfunction of lumbar region: Secondary | ICD-10-CM

## 2020-12-15 MED ORDER — PREDNISONE 20 MG PO TABS
20.0000 mg | ORAL_TABLET | Freq: Every day | ORAL | 0 refills | Status: DC
  Filled 2020-12-15: qty 7, 7d supply, fill #0

## 2020-12-15 MED ORDER — PREDNISONE 20 MG PO TABS
20.0000 mg | ORAL_TABLET | Freq: Every day | ORAL | 0 refills | Status: DC
  Filled 2020-12-15: qty 14, 14d supply, fill #0

## 2020-12-15 NOTE — Patient Instructions (Signed)
Good to see you Prednisone 20 mg 7 days see if that helps rash Continue exercises See me again in 5-6 weeks

## 2020-12-15 NOTE — Progress Notes (Signed)
[place] [address] [place] Phone: [phone] Subjective:   I Tabitha [name] am serving as a Education administrator for Dr. Hulan Saas.  This visit occurred during the SARS-CoV-2 public health emergency.  Safety protocols were in place, including screening questions prior to the visit, additional usage of staff PPE, and extensive cleaning of exam room while observing appropriate contact time as indicated for disinfecting solutions.   I'm seeing this patient by the request  of:  Hoyt Koch, MD  CC: Back pain and knee pain follow-up  [id number]  Tabitha [name] is a 55 y.o. female coming in with complaint of back and neck pain Patient states she is doing well. A spot on her back catches but not all the time.  Patient has remained fairly active at this time.  Still has not been making as much headway on the weight loss that she would like to be making.     Patient recently started for her psoriatic arthritis on a new medication called Taltz    Reviewed prior external information including notes and imaging from previsou exam, outside providers and external EMR if available.   As well as notes that were available from care everywhere and other healthcare systems.  Past medical history, social, surgical and family history all reviewed in electronic medical record.  No pertanent information unless stated regarding to the chief complaint.   Past Medical History:  Diagnosis Date   Arthritis    Asthma    De Quervain's disease (tenosynovitis)    Dermatophytosis, scalp    Diabetes mellitus    Hypercholesteremia    Obesity    Polycystic ovarian disease    Psoriatic arthritis (HCC)    Wears glasses     Allergies  Allergen Reactions   Chocolate Anaphylaxis   Food     chocolate   Statins     Intolerant  lft elevation   Latex Rash     Review of Systems:  No headache, visual changes, nausea, vomiting, diarrhea, constipation,  dizziness, abdominal pain, skin rash, fevers, chills, night sweats, weight loss, swollen lymph nodes,  chest pain, shortness of breath, mood changes. POSITIVE muscle aches, body aches, joint swelling  Objective  Blood pressure 132/70, pulse 76, height 5\' 2"  (1.575 m), weight 237 lb (107.5 kg), SpO2 98 %.   General: No apparent distress alert and oriented x3 mood and affect normal, dressed appropriately.  HEENT: Pupils equal, extraocular movements intact  Respiratory: Patient's speak in full sentences and does not appear short of breath  Cardiovascular: No lower extremity edema, non tender, no erythema  Low back exam does have some tightness noted in the paraspinal musculature.  Patient does have also tightness in the thoracolumbar junction.  Neck exam mild decrease in sidebending bilaterally. Knee exams bilaterally have very small amount of effusion noted today of the patellofemoral joint.  Osteopathic findings  C4 flexed rotated and side bent right C6 flexed rotated and side bent left T3 extended rotated and side bent right inhaled rib L2 flexed rotated and side bent right L5 flexed rotated and side bent left Sacrum right on right       Assessment and Plan:  Low back pain Chronic problem with mild exacerbation.  Patient is taking different medications secondary to the pain symptoms and Celebrex.  Patient was also started on immunomodulator that I think will hopefully be beneficial.  Encouraged her to continue to work out which she is doing really well  and has improved her muscle tone recently as well as core strengthening.  Follow-up with me again in 6 to 8 weeks   Nonallopathic problems  Decision today to treat with OMT was based on Physical Exam  After verbal consent patient was treated with HVLA, ME, FPR techniques in cervical, rib, thoracic, lumbar, and sacral  areas  Patient tolerated the procedure well with improvement in symptoms  Patient given exercises, stretches and  lifestyle modifications  See medications in patient instructions if given  Patient will follow up in 4-8 weeks      The above documentation has been reviewed and is accurate and complete Lyndal Pulley, DO       Note: This dictation was prepared with Dragon dictation along with smaller phrase technology. Any transcriptional errors that result from this process are unintentional.

## 2020-12-16 ENCOUNTER — Encounter: Payer: Self-pay | Admitting: Family Medicine

## 2020-12-16 ENCOUNTER — Other Ambulatory Visit (HOSPITAL_BASED_OUTPATIENT_CLINIC_OR_DEPARTMENT_OTHER): Payer: Self-pay

## 2020-12-16 DIAGNOSIS — M545 Low back pain, unspecified: Secondary | ICD-10-CM | POA: Diagnosis not present

## 2020-12-16 MED FILL — Fluticasone Propionate Nasal Susp 50 MCG/ACT: NASAL | 30 days supply | Qty: 16 | Fill #2 | Status: AC

## 2020-12-16 NOTE — Assessment & Plan Note (Signed)
Chronic problem with mild exacerbation.  Patient is taking different medications secondary to the pain symptoms and Celebrex.  Patient was also started on immunomodulator that I think will hopefully be beneficial.  Encouraged her to continue to work out which she is doing really well and has improved her muscle tone recently as well as core strengthening.  Follow-up with me again in 6 to 8 weeks

## 2020-12-17 ENCOUNTER — Other Ambulatory Visit (HOSPITAL_BASED_OUTPATIENT_CLINIC_OR_DEPARTMENT_OTHER): Payer: Self-pay

## 2020-12-20 ENCOUNTER — Other Ambulatory Visit (HOSPITAL_COMMUNITY): Payer: Self-pay

## 2020-12-20 ENCOUNTER — Other Ambulatory Visit (HOSPITAL_BASED_OUTPATIENT_CLINIC_OR_DEPARTMENT_OTHER): Payer: Self-pay

## 2020-12-20 MED ORDER — VYVANSE 30 MG PO CAPS
ORAL_CAPSULE | ORAL | 0 refills | Status: DC
  Filled 2020-12-20: qty 30, 30d supply, fill #0

## 2020-12-25 DIAGNOSIS — G4733 Obstructive sleep apnea (adult) (pediatric): Secondary | ICD-10-CM | POA: Diagnosis not present

## 2020-12-30 DIAGNOSIS — Z79899 Other long term (current) drug therapy: Secondary | ICD-10-CM | POA: Diagnosis not present

## 2020-12-30 DIAGNOSIS — F902 Attention-deficit hyperactivity disorder, combined type: Secondary | ICD-10-CM | POA: Diagnosis not present

## 2021-01-04 ENCOUNTER — Other Ambulatory Visit (HOSPITAL_COMMUNITY): Payer: Self-pay

## 2021-01-04 MED FILL — Nystatin-Triamcinolone Cream 100000-0.1 Unit/GM-%: CUTANEOUS | 10 days supply | Qty: 15 | Fill #0 | Status: AC

## 2021-01-05 ENCOUNTER — Other Ambulatory Visit (HOSPITAL_COMMUNITY): Payer: Self-pay

## 2021-01-06 ENCOUNTER — Other Ambulatory Visit (HOSPITAL_COMMUNITY): Payer: Self-pay

## 2021-01-07 ENCOUNTER — Ambulatory Visit: Payer: 59 | Admitting: Pulmonary Disease

## 2021-01-07 ENCOUNTER — Encounter: Payer: Self-pay | Admitting: Pulmonary Disease

## 2021-01-07 ENCOUNTER — Other Ambulatory Visit: Payer: Self-pay

## 2021-01-07 ENCOUNTER — Other Ambulatory Visit (HOSPITAL_COMMUNITY): Payer: Self-pay

## 2021-01-07 VITALS — BP 122/72 | HR 76 | Temp 99.0°F | Ht 62.0 in | Wt 230.2 lb

## 2021-01-07 DIAGNOSIS — G4733 Obstructive sleep apnea (adult) (pediatric): Secondary | ICD-10-CM

## 2021-01-07 DIAGNOSIS — Z9989 Dependence on other enabling machines and devices: Secondary | ICD-10-CM | POA: Diagnosis not present

## 2021-01-07 NOTE — Patient Instructions (Signed)
Obstructive sleep apnea -Adequately treated with CPAP therapy  No changes need made at the present time  Will see you back in 6 months  Call/contact with any significant concerns

## 2021-01-07 NOTE — Progress Notes (Signed)
Tabitha [name]    UB:[id number]    [birth year]  Primary Care Physician:Crawford, Real Cons, MD  Referring Physician: Hoyt Koch, MD [address] [place],  [place] [address]  Chief complaint:   Follow-up for obstructive sleep apnea  HPI:  Patient was recently diagnosed with moderate obstructive sleep apnea Has been using CPAP regularly Some improvement in symptoms with CPAP use Recently also started using low knees for nasal stuffiness which has also helped significantly She gets about 7 hours of sleep nightly Wakes up in the morning feeling like she is at a good nights rest  Has managed to lose about 60 pounds Exercises regularly about 6 days a week  Quit smoking over 30 years ago  Outpatient Encounter Medications as of 01/07/2021  Medication Sig   ACCU-CHEK GUIDE test strip    Acetaminophen (TYLENOL PO) Take by mouth as needed.   aspirin EC 81 MG tablet Take 81 mg by mouth daily.   celecoxib (CELEBREX) 100 MG capsule TAKE 1 CAPSULE BY MOUTH 2 TIMES DAILY AS NEEDED FOR PAIN   cholecalciferol (VITAMIN D3) 25 MCG (1000 UT) tablet Take 1,000 Units by mouth daily.   clobetasol cream (TEMOVATE) [id number] % Apply 1 application topically 2 (two) times daily. As directed; never apply to face.   desonide (DESOWEN) 0.05 % cream Apply topically 2 (two) times daily as needed.   Diclofenac Sodium (PENNSAID) 2 % SOLN Place 2 application onto the skin 2 (two) times daily.   Empagliflozin-metFORMIN HCl ER 03-999 MG TB24 TAKE 1 TABLET BY MOUTH WITH BREAKFAST ONCE A DAY   fluticasone (FLONASE) 50 MCG/ACT nasal spray PLACE 2 SPRAYS INTO EACH NOSTRIL AT BEDTIME   folic acid (FOLVITE) 1 MG tablet TAKE 1 TABLET BY MOUTH TWO TIMES DAILY   folic acid (FOLVITE) 1 MG tablet TAKE 1 TABLET BY MOUTH TWO TIMES DAILY   Ixekizumab (TALTZ) 80 MG/ML SOAJ Inject 80 mg subcutaneously every 28 days   liraglutide (VICTOZA) 18 MG/3ML SOPN INJECT 1.8 MG UNDER THE SKIN ONCE DAILY   lisdexamfetamine  (VYVANSE) 30 MG capsule Take 1 capsule by mouth daily   methotrexate 50 MG/2ML injection Inject 83m under the skin once weekly   nystatin-triamcinolone (MYCOLOG II) cream APPLY TO THE AFFECTED AREA(S) 2 TIMES DAILY FOR 10 DAYS   Omega-3 Fatty Acids (FISH OIL) 1200 MG CAPS Take 1-2 capsules (1,200-2,400 mg total) by mouth 2 (two) times daily. Takes 2400 mg qam and 1200 mg qpm   pimecrolimus (ELIDEL) 1 % cream Apply topically 2 times daily.   TURMERIC PO Take by mouth 2 (two) times daily.    UNIFINE PENTIPS 32G X 4 MM MISC    [DISCONTINUED] busPIRone (BUSPAR) 5 MG tablet TAKE 1 TABLET BY MOUTH 2 TIMES DAILY AS NEEDED   [DISCONTINUED] escitalopram (LEXAPRO) 10 MG tablet Take 1 tablet (10 mg total) by mouth daily.   [DISCONTINUED] Ixekizumab (TALTZ) 80 MG/ML SOAJ Inject 160 mg subcutaneously once daily for 1 day   [DISCONTINUED] lisdexamfetamine (VYVANSE) 20 MG capsule Take 1 capsule by mouth daily   [DISCONTINUED] predniSONE (DELTASONE) 20 MG tablet Take 1 tablet (20 mg total) by mouth daily with breakfast.   No facility-administered encounter medications on file as of 01/07/2021.    Allergies as of 01/07/2021 - Review Complete 01/07/2021  Allergen Reaction Noted   Chocolate Anaphylaxis 05/06/2013   Food  08/16/2013   Statins  05/28/2012   Latex Rash 05/06/2013    Past Medical  History:  Diagnosis Date   Arthritis    Asthma    De Quervain's disease (tenosynovitis)    Dermatophytosis, scalp    Diabetes mellitus    Hypercholesteremia    Obesity    Polycystic ovarian disease    Psoriatic arthritis ([place])    Wears glasses     Past Surgical History:  Procedure Laterality Date   BREAST BIOPSY Right    CARPAL TUNNEL RELEASE     COLONOSCOPY     DE QUERVAIN'S RELEASE  2012   left   KNEE ARTHROPLASTY     KNEE ARTHROSCOPY Right 05/06/2013   Procedure: RIGHT KNEE ARTHROSCOPY ;  Surgeon: Hessie Dibble, MD;  Location: [facility];  Service: Orthopedics;  Laterality: Right;   partial lateral minisectomy and chondroplasty   WISDOM TOOTH EXTRACTION     WRIST SURGERY  2001   carpel tunnel -rt    Family History  Problem Relation Age of Onset   Diabetes Father    Hypertension Father    Stroke Father    Kidney disease Father    Heart disease Father    Cancer Mother 11       breast ca died at age 45   Breast cancer Mother 51   Hypertension Other    Diabetes type II Other    Obesity Other    Obesity Brother    Hypertension Brother    Heart disease Maternal Grandmother    Alzheimer's disease Maternal Grandmother    Diabetes Paternal Grandmother    Heart disease Paternal Grandfather     Social History   Socioeconomic History   Marital status: Divorced    Spouse name: Not on file   Number of children: Not on file   Years of education: Not on file   Highest education level: Not on file  Occupational History   Occupation: nurse    Employer: [employer]    Comment: at [employer]  Tobacco Use   Smoking status: Former    Packs/day: 1.30    Years: 12.00    Pack years: 15.60    Types: Cigarettes    Start date: 06/12/1984    Quit date: 03/18/1995    Years since quitting: 25.8   Smokeless tobacco: Never   Tobacco comments:    Not interested in returning to smoking.   Vaping Use   Vaping Use: Never used  Substance and Sexual Activity   Alcohol use: Yes    Comment: rarely   Drug use: No   Sexual activity: Yes    Partners: Male    Comment: married  Other Topics Concern   Not on file  Social History Narrative   Not on file   Social Determinants of Health   Financial Resource Strain: Not on file  Food Insecurity: Not on file  Transportation Needs: Not on file  Physical Activity: Not on file  Stress: Not on file  Social Connections: Not on file  Intimate Partner Violence: Not on file    Review of Systems  Constitutional:  Negative for fatigue.  Respiratory:  Positive for apnea.   Psychiatric/Behavioral:  Positive for sleep  disturbance.    Vitals:   01/07/21 0905  BP: 122/72  Pulse: 76  SpO2: 98%     Physical Exam Constitutional:      Appearance: She is obese.  HENT:     Head: Normocephalic.     Mouth/Throat:     Mouth: Mucous membranes are moist.  Eyes:  Pupils: Pupils are equal, round, and reactive to light.  Cardiovascular:     Rate and Rhythm: Normal rate and regular rhythm.     Heart sounds: No murmur heard.   No friction rub.  Pulmonary:     Effort: No respiratory distress.     Breath sounds: No stridor. No wheezing or rhonchi.  Musculoskeletal:     Cervical back: No rigidity or tenderness.  Neurological:     Mental Status: She is alert.  Psychiatric:        Mood and Affect: Mood normal.   Sleep study reviewed showing moderate obstructive sleep apnea  Compliance data reveals 100% compliance with CPAP Average use of 6 hours 49 minutes Set between 5 and 15 Residual AHI of 0.7  Assessment:  Moderate obstructive sleep apnea  Obesity  Diabetes, hyperlipidemia History of polycystic ovaries  Plan/Recommendations: Good improvement with CPAP therapy  Encouraged to continue CPAP use  Encouraged to call with any significant concerns  Encouraged regarding weight loss efforts  Follow-up in 6 months   Sherrilyn Rist MD [facility] 01/07/2021, 9:11 AM  CC: Hoyt Koch, *

## 2021-01-18 ENCOUNTER — Other Ambulatory Visit (HOSPITAL_BASED_OUTPATIENT_CLINIC_OR_DEPARTMENT_OTHER): Payer: Self-pay

## 2021-01-18 DIAGNOSIS — M545 Low back pain, unspecified: Secondary | ICD-10-CM | POA: Diagnosis not present

## 2021-01-18 NOTE — Progress Notes (Signed)
[place] Phone: [phone] Subjective:   Tabitha [name], am serving as a scribe for Dr. Hulan Saas.  This visit occurred during the SARS-CoV-2 public health emergency.  Safety protocols were in place, including screening questions prior to the visit, additional usage of staff PPE, and extensive cleaning of exam room while observing appropriate contact time as indicated for disinfecting solutions.    I'm seeing this patient by the request  of:  Hoyt Koch, MD  CC: Back and neck pain follow-up  [id number]  Tabitha [name] is a 55 y.o. female coming in with complaint of back and neck pain. OMT 12/15/2020. Patient states that she has been having right sided lumbar spine tightness. Pain can be achy, sharp or tingling with [name] specific pattern to her pain.         Reviewed prior external information including notes and imaging from previsou exam, outside providers and external EMR if available.  Seen pulmonology for OSA recently   As well as notes that were available from care everywhere and other healthcare systems.  Past medical history, social, surgical and family history all reviewed in electronic medical record.  [name] pertanent information unless stated regarding to the chief complaint.   Past Medical History:  Diagnosis Date   Arthritis    Asthma    De Quervain's disease (tenosynovitis)    Dermatophytosis, scalp    Diabetes mellitus    Hypercholesteremia    Obesity    Polycystic ovarian disease    Psoriatic arthritis (HCC)    Wears glasses     Allergies  Allergen Reactions   Chocolate Anaphylaxis   Food     chocolate   Statins     Intolerant  lft elevation   Latex Rash     Review of Systems:  [name] headache, visual changes, nausea, vomiting, diarrhea, constipation, dizziness, abdominal pain, skin rash, fevers, chills, night sweats, weight loss, swollen lymph nodes, , chest pain,  shortness of breath, mood changes. POSITIVE muscle aches, body aches, joint swelling  Objective  Blood pressure 122/72, pulse 87, height '5\' 2"'$  (1.575 m), weight 235 lb (106.6 kg), SpO2 99 %.   General: [name] apparent distress alert and oriented x3 mood and affect normal, dressed appropriately.  HEENT: Pupils equal, extraocular movements intact  Respiratory: Patient's speak in full sentences and does not appear short of breath  Cardiovascular: [name] lower extremity edema, non tender, [name] erythema  Gait normal with good balance and coordination.  MSK:   Back - low back shows tightness noted with FABER- negative SLT increased tightness are noted in the paraspinal musculature of the thoracolumbar juncture right greater than left.  Osteopathic findings  C4 flexed rotated and side bent right C6 flexed rotated and side bent left T3 extended rotated and side bent right inhaled rib T8 extended rotated and side bent left L2 flexed rotated and side bent right L5 F RS left  Sacrum right on right       Assessment and Plan:  Low back pain Discussed hep, discussed core strength  Discussed icing patient declined muscle relaxer See me again in 4-6 weeks    Nonallopathic problems  Decision today to treat with OMT was based on Physical Exam  After verbal consent patient was treated with HVLA, ME, FPR techniques in cervical, rib, thoracic, lumbar, and sacral  areas  Patient tolerated the procedure well with improvement in symptoms  Patient given exercises, stretches and  lifestyle modifications  See medications in patient instructions if given  Patient will follow up in 4-8 weeks      The above documentation has been reviewed and is accurate and complete Lyndal Pulley, DO       Note: This dictation was prepared with Dragon dictation along with smaller phrase technology. Any transcriptional errors that result from this process are unintentional.

## 2021-01-19 ENCOUNTER — Encounter: Payer: Self-pay | Admitting: Family Medicine

## 2021-01-19 ENCOUNTER — Other Ambulatory Visit: Payer: Self-pay

## 2021-01-19 ENCOUNTER — Ambulatory Visit: Payer: 59 | Admitting: Family Medicine

## 2021-01-19 VITALS — BP 122/72 | HR 87 | Ht 62.0 in | Wt 235.0 lb

## 2021-01-19 DIAGNOSIS — M9902 Segmental and somatic dysfunction of thoracic region: Secondary | ICD-10-CM

## 2021-01-19 DIAGNOSIS — M9908 Segmental and somatic dysfunction of rib cage: Secondary | ICD-10-CM

## 2021-01-19 DIAGNOSIS — M9903 Segmental and somatic dysfunction of lumbar region: Secondary | ICD-10-CM | POA: Diagnosis not present

## 2021-01-19 DIAGNOSIS — M9904 Segmental and somatic dysfunction of sacral region: Secondary | ICD-10-CM

## 2021-01-19 DIAGNOSIS — G8929 Other chronic pain: Secondary | ICD-10-CM | POA: Diagnosis not present

## 2021-01-19 DIAGNOSIS — M545 Low back pain, unspecified: Secondary | ICD-10-CM

## 2021-01-19 DIAGNOSIS — M9901 Segmental and somatic dysfunction of cervical region: Secondary | ICD-10-CM

## 2021-01-19 NOTE — Assessment & Plan Note (Signed)
Discussed hep, discussed core strength  Discussed icing patient declined muscle relaxer See me again in 4-6 weeks

## 2021-01-19 NOTE — Patient Instructions (Signed)
Good to see you Sorry for your loss Will watch back See me again in 4-6 weeks

## 2021-01-21 ENCOUNTER — Other Ambulatory Visit (HOSPITAL_BASED_OUTPATIENT_CLINIC_OR_DEPARTMENT_OTHER): Payer: Self-pay

## 2021-01-21 MED ORDER — VYVANSE 30 MG PO CAPS
ORAL_CAPSULE | ORAL | 0 refills | Status: DC
  Filled 2021-01-21: qty 30, 30d supply, fill #0

## 2021-01-28 ENCOUNTER — Other Ambulatory Visit (HOSPITAL_COMMUNITY): Payer: Self-pay

## 2021-02-01 ENCOUNTER — Other Ambulatory Visit (HOSPITAL_COMMUNITY): Payer: Self-pay

## 2021-02-03 MED FILL — Empagliflozin-Metformin HCl Tab ER 24HR 10-1000 MG: ORAL | 90 days supply | Qty: 90 | Fill #1 | Status: AC

## 2021-02-04 ENCOUNTER — Other Ambulatory Visit (HOSPITAL_COMMUNITY): Payer: Self-pay

## 2021-02-07 DIAGNOSIS — M545 Low back pain, unspecified: Secondary | ICD-10-CM | POA: Diagnosis not present

## 2021-02-16 ENCOUNTER — Other Ambulatory Visit (HOSPITAL_COMMUNITY): Payer: Self-pay

## 2021-02-16 ENCOUNTER — Ambulatory Visit: Payer: 59 | Admitting: Family Medicine

## 2021-02-17 ENCOUNTER — Other Ambulatory Visit (HOSPITAL_COMMUNITY): Payer: Self-pay

## 2021-02-17 NOTE — Progress Notes (Signed)
[place] Phone: [phone] Subjective:   Tabitha [name], am serving as a scribe for Dr. Hulan [name]. This visit occurred during the SARS-CoV-2 public health emergency.  Safety protocols were in place, including screening questions prior to the visit, additional usage of staff PPE, and extensive cleaning of exam room while observing appropriate contact time as indicated for disinfecting solutions.   I'm seeing this patient by the request  of:  Tabitha Koch, MD  CC: Back and neck pain follow-up  [id number]  Tabitha [name] is a 55 y.o. female coming in with complaint of back and neck pain. OMT 01/19/2021. Patient states that she has been doing well since last visit.   Patient also notes limited ROM in both shoulders. Flexion and with arm at 90/90 she feels tightness. Also notes weakness in these positions.   Medications patient has been prescribed: None  Taking: Only vitamins         Reviewed prior external information including notes and imaging from previsou exam, outside providers and external EMR if available.   As well as notes that were available from care everywhere and other healthcare systems.  Past medical history, social, surgical and family history all reviewed in electronic medical record.  [name] pertanent information unless stated regarding to the chief complaint.   Past Medical History:  Diagnosis Date   Arthritis    Asthma    De Quervain's disease (tenosynovitis)    Dermatophytosis, scalp    Diabetes mellitus    Hypercholesteremia    Obesity    Polycystic ovarian disease    Psoriatic arthritis (HCC)    Wears glasses     Allergies  Allergen Reactions   Chocolate Anaphylaxis   Food     chocolate   Statins     Intolerant  lft elevation   Latex Rash     Review of Systems:  [name] headache, visual changes, nausea, vomiting, diarrhea, constipation, dizziness, abdominal  pain, skin rash, fevers, chills, night sweats, weight loss, swollen lymph nodes, body aches, joint swelling, chest pain, shortness of breath, mood changes. POSITIVE muscle aches  Objective  Blood pressure 118/78, pulse 79, height '5\' 2"'$  (1.575 m), weight 226 lb (102.5 kg), SpO2 98 %.   General: [name] apparent distress alert and oriented x3 mood and affect normal, dressed appropriately.  HEENT: Pupils equal, extraocular movements intact  Respiratory: Patient's speak in full sentences and does not appear short of breath  Cardiovascular: [name] lower extremity edema, non tender, [name] erythema  Neuro: Cranial nerves II through XII are intact, neurovascularly intact in all extremities with 2+ DTRs and 2+ pulses.  Gait normal with good balance and coordination.   Back back exam does have loss of lordosis.  Tenderness to palpation in the paraspinal musculature.  Patient does have tightness in the knees bilaterally.  Arthritic changes noted. Neck exam also has some mild loss of lordosis.  Patient shoulders To have fairly good range of motion.  Tightness more in the scapular region.    Osteopathic findings  C3 flexed rotated and side bent right C6 flexed rotated and side bent left T3 extended rotated and side bent right inhaled rib T9 extended rotated and side bent left L2 flexed rotated and side bent right Sacrum right on right       Assessment and Plan:  Low back pain Low back pain, chronic problem with exacerbation.  Overall doing relatively well.  He does have  the Celebrex.  Have tried multiple different other medications.  Continuing to be treated for autoimmune disease.  Patient has made long strides and likely will have her BMI under 40 in the near future.  Follow-up with me again in 6 to 8 weeks   Nonallopathic problems  Decision today to treat with OMT was based on Physical Exam  After verbal consent patient was treated with HVLA, ME, FPR techniques in cervical, rib, thoracic, lumbar, and  sacral  areas  Patient tolerated the procedure well with improvement in symptoms  Patient given exercises, stretches and lifestyle modifications  See medications in patient instructions if given  Patient will follow up in 4-8 weeks      The above documentation has been reviewed and is accurate and complete Tabitha Pulley, DO       Note: This dictation was prepared with Dragon dictation along with smaller phrase technology. Any transcriptional errors that result from this process are unintentional.

## 2021-02-18 ENCOUNTER — Ambulatory Visit: Payer: 59 | Admitting: Family Medicine

## 2021-02-18 ENCOUNTER — Other Ambulatory Visit (HOSPITAL_COMMUNITY): Payer: Self-pay

## 2021-02-18 ENCOUNTER — Other Ambulatory Visit: Payer: Self-pay

## 2021-02-18 VITALS — BP 118/78 | HR 79 | Ht 62.0 in | Wt 226.0 lb

## 2021-02-18 DIAGNOSIS — G8929 Other chronic pain: Secondary | ICD-10-CM | POA: Diagnosis not present

## 2021-02-18 DIAGNOSIS — M9904 Segmental and somatic dysfunction of sacral region: Secondary | ICD-10-CM

## 2021-02-18 DIAGNOSIS — M9901 Segmental and somatic dysfunction of cervical region: Secondary | ICD-10-CM

## 2021-02-18 DIAGNOSIS — M9908 Segmental and somatic dysfunction of rib cage: Secondary | ICD-10-CM | POA: Diagnosis not present

## 2021-02-18 DIAGNOSIS — M9902 Segmental and somatic dysfunction of thoracic region: Secondary | ICD-10-CM

## 2021-02-18 DIAGNOSIS — M545 Low back pain, unspecified: Secondary | ICD-10-CM | POA: Diagnosis not present

## 2021-02-18 DIAGNOSIS — M9903 Segmental and somatic dysfunction of lumbar region: Secondary | ICD-10-CM | POA: Diagnosis not present

## 2021-02-18 NOTE — Patient Instructions (Signed)
Scapular exercises When doing back exercises keep hands at nipple line See me again in 6 weeks

## 2021-02-19 NOTE — Assessment & Plan Note (Signed)
Low back pain, chronic problem with exacerbation.  Overall doing relatively well.  He does have the Celebrex.  Have tried multiple different other medications.  Continuing to be treated for autoimmune disease.  Patient has made long strides and likely will have her BMI under 40 in the near future.  Follow-up with me again in 6 to 8 weeks

## 2021-02-21 ENCOUNTER — Other Ambulatory Visit (HOSPITAL_COMMUNITY): Payer: Self-pay

## 2021-02-22 ENCOUNTER — Other Ambulatory Visit (HOSPITAL_BASED_OUTPATIENT_CLINIC_OR_DEPARTMENT_OTHER): Payer: Self-pay

## 2021-02-22 MED FILL — Fluticasone Propionate Nasal Susp 50 MCG/ACT: NASAL | 30 days supply | Qty: 16 | Fill #3 | Status: AC

## 2021-02-24 ENCOUNTER — Other Ambulatory Visit (HOSPITAL_BASED_OUTPATIENT_CLINIC_OR_DEPARTMENT_OTHER): Payer: Self-pay

## 2021-02-24 MED ORDER — VYVANSE 40 MG PO CAPS
40.0000 mg | ORAL_CAPSULE | Freq: Every day | ORAL | 0 refills | Status: DC
  Filled 2021-02-24: qty 30, 30d supply, fill #0

## 2021-02-25 ENCOUNTER — Other Ambulatory Visit (HOSPITAL_COMMUNITY): Payer: Self-pay

## 2021-03-01 ENCOUNTER — Other Ambulatory Visit (HOSPITAL_COMMUNITY): Payer: Self-pay

## 2021-03-10 DIAGNOSIS — G4733 Obstructive sleep apnea (adult) (pediatric): Secondary | ICD-10-CM | POA: Diagnosis not present

## 2021-03-10 NOTE — Progress Notes (Signed)
Opened in error  See office visit 08/03/20.

## 2021-03-17 ENCOUNTER — Other Ambulatory Visit (HOSPITAL_BASED_OUTPATIENT_CLINIC_OR_DEPARTMENT_OTHER): Payer: Self-pay

## 2021-03-17 DIAGNOSIS — L4050 Arthropathic psoriasis, unspecified: Secondary | ICD-10-CM | POA: Diagnosis not present

## 2021-03-17 DIAGNOSIS — Z79899 Other long term (current) drug therapy: Secondary | ICD-10-CM | POA: Diagnosis not present

## 2021-03-17 DIAGNOSIS — M159 Polyosteoarthritis, unspecified: Secondary | ICD-10-CM | POA: Diagnosis not present

## 2021-03-17 DIAGNOSIS — L409 Psoriasis, unspecified: Secondary | ICD-10-CM | POA: Diagnosis not present

## 2021-03-17 MED ORDER — CLOBETASOL PROPIONATE 0.05 % EX SOLN
Freq: Two times a day (BID) | CUTANEOUS | 5 refills | Status: DC
  Filled 2021-03-17 (×2): qty 50, 30d supply, fill #0
  Filled 2021-11-18: qty 50, 30d supply, fill #1

## 2021-03-21 ENCOUNTER — Other Ambulatory Visit (HOSPITAL_BASED_OUTPATIENT_CLINIC_OR_DEPARTMENT_OTHER): Payer: Self-pay

## 2021-03-21 MED FILL — Empagliflozin-Metformin HCl Tab ER 24HR 10-1000 MG: ORAL | 90 days supply | Qty: 90 | Fill #2 | Status: CN

## 2021-03-23 ENCOUNTER — Other Ambulatory Visit (HOSPITAL_BASED_OUTPATIENT_CLINIC_OR_DEPARTMENT_OTHER): Payer: Self-pay

## 2021-03-23 DIAGNOSIS — M545 Low back pain, unspecified: Secondary | ICD-10-CM | POA: Diagnosis not present

## 2021-03-23 MED ORDER — VYVANSE 40 MG PO CAPS
ORAL_CAPSULE | ORAL | 0 refills | Status: DC
  Filled 2021-03-23: qty 30, 30d supply, fill #0

## 2021-03-24 ENCOUNTER — Other Ambulatory Visit (HOSPITAL_COMMUNITY): Payer: Self-pay

## 2021-03-28 ENCOUNTER — Other Ambulatory Visit (HOSPITAL_COMMUNITY): Payer: Self-pay

## 2021-04-01 ENCOUNTER — Other Ambulatory Visit (HOSPITAL_BASED_OUTPATIENT_CLINIC_OR_DEPARTMENT_OTHER): Payer: Self-pay

## 2021-04-01 ENCOUNTER — Ambulatory Visit: Payer: 59 | Admitting: Family Medicine

## 2021-04-01 DIAGNOSIS — F902 Attention-deficit hyperactivity disorder, combined type: Secondary | ICD-10-CM | POA: Diagnosis not present

## 2021-04-01 DIAGNOSIS — Z79899 Other long term (current) drug therapy: Secondary | ICD-10-CM | POA: Diagnosis not present

## 2021-04-07 ENCOUNTER — Other Ambulatory Visit (HOSPITAL_BASED_OUTPATIENT_CLINIC_OR_DEPARTMENT_OTHER): Payer: Self-pay

## 2021-04-07 MED ORDER — AMPHETAMINE-DEXTROAMPHETAMINE 10 MG PO TABS
ORAL_TABLET | ORAL | 0 refills | Status: DC
  Filled 2021-04-07: qty 30, 30d supply, fill #0

## 2021-04-09 MED FILL — Empagliflozin-Metformin HCl Tab ER 24HR 10-1000 MG: ORAL | 90 days supply | Qty: 90 | Fill #2 | Status: CN

## 2021-04-11 ENCOUNTER — Other Ambulatory Visit (HOSPITAL_BASED_OUTPATIENT_CLINIC_OR_DEPARTMENT_OTHER): Payer: Self-pay

## 2021-04-13 ENCOUNTER — Other Ambulatory Visit (HOSPITAL_BASED_OUTPATIENT_CLINIC_OR_DEPARTMENT_OTHER): Payer: Self-pay

## 2021-04-18 ENCOUNTER — Other Ambulatory Visit (HOSPITAL_BASED_OUTPATIENT_CLINIC_OR_DEPARTMENT_OTHER): Payer: Self-pay

## 2021-04-18 MED FILL — Empagliflozin-Metformin HCl Tab ER 24HR 10-1000 MG: ORAL | 90 days supply | Qty: 90 | Fill #2 | Status: AC

## 2021-04-18 NOTE — Progress Notes (Signed)
Tabitha [name] D.[facility] [place] Phone: [phone]   Assessment and Plan:     1. Neck pain 2. Somatic dysfunction of cervical region 3. Somatic dysfunction of thoracic region 4. Somatic dysfunction of lumbar region 5. Somatic dysfunction of pelvic region 6. Somatic dysfunction of rib region -Chronic with exacerbation, subsequent visit - Recurrence of typical musculoskeletal complaints with most prominent being lower neck and upper thoracic pain today - Patient elected for repeat OMT as it has been significantly beneficial in the past.  Tolerated well per note below.    Decision today to treat with OMT was based on Physical Exam   After verbal consent patient was treated with HVLA (high velocity low amplitude), ME (muscle energy), FPR (flex positional release), ST (soft tissue), PC/PD (Pelvic Compression/ Pelvic Decompression) techniques in cervical, rib, thoracic, lumbar, and pelvic areas. Patient tolerated the procedure well with improvement in symptoms.  Patient educated on potential side effects of soreness and recommended to rest, hydrate, and use Tylenol as needed for pain control.   Pertinent previous records reviewed include none pertinent   Follow Up: 4 weeks for repeat OMT.  Can also readdress patient's concern of tingling and first digits of left hand consistent with a median neuropathy, however negative testing on physical exam at today's visit for carpal tunnel   Subjective:   I, Judy Pimple, am serving as a scribe for Dr. Glennon Mac  Chief Complaint: Neck and back pain   HPI:   04/19/21 Patient is a 55 year old female presenting with neck and back pain. Patient last saw Dr. Tamala Julian on 02/18/21 for the same reason and had OMT. Today patient states that her neck is really stiff today, states she is having numbness that goes down her arm and in her middle three fingers worse when driving, biking, elliptical, or  knitting.   Relevant Historical Information: DM type II, psoriatic arthritis  Additional pertinent review of systems negative.  Current Outpatient Medications  Medication Sig Dispense Refill   ACCU-CHEK GUIDE test strip   3   Acetaminophen (TYLENOL PO) Take by mouth as needed.     amphetamine-dextroamphetamine (ADDERALL) 10 MG tablet Take 1 tablet by mouth daily as directed 30 tablet 0   aspirin EC 81 MG tablet Take 81 mg by mouth daily.     celecoxib (CELEBREX) 100 MG capsule TAKE 1 CAPSULE BY MOUTH 2 TIMES DAILY AS NEEDED FOR PAIN 60 capsule 5   cholecalciferol (VITAMIN D3) 25 MCG (1000 UT) tablet Take 1,000 Units by mouth daily.     clobetasol (TEMOVATE) 0.05 % external solution Apply on to the skin 2 (two) times daily. 50 mL 5   clobetasol cream (TEMOVATE) 0.98 % Apply 1 application topically 2 (two) times daily. As directed; never apply to face. 30 g 2   desonide (DESOWEN) 0.05 % cream Apply topically 2 (two) times daily as needed. 30 g 0   Diclofenac Sodium (PENNSAID) 2 % SOLN Place 2 application onto the skin 2 (two) times daily. 112 g 3   Empagliflozin-metFORMIN HCl ER 03-999 MG TB24 TAKE 1 TABLET BY MOUTH WITH BREAKFAST ONCE A DAY 90 tablet 3   fluticasone (FLONASE) 50 MCG/ACT nasal spray PLACE 2 SPRAYS INTO EACH NOSTRIL AT BEDTIME 16 g 11   folic acid (FOLVITE) 1 MG tablet TAKE 1 TABLET BY MOUTH TWO TIMES DAILY 180 tablet 4   Ixekizumab (TALTZ) 80 MG/ML SOAJ Inject 80 mg subcutaneously every 28  days 1 mL 5   lisdexamfetamine (VYVANSE) 40 MG capsule Take 1 capsule by mouth daily 30 capsule 0   nystatin-triamcinolone (MYCOLOG II) cream APPLY TO THE AFFECTED AREA(S) 2 TIMES DAILY FOR 10 DAYS 15 g 2   Omega-3 Fatty Acids (FISH OIL) 1200 MG CAPS Take 1-2 capsules (1,200-2,400 mg total) by mouth 2 (two) times daily. Takes 2400 mg qam and 1200 mg qpm     TURMERIC PO Take by mouth 2 (two) times daily.      UNIFINE PENTIPS 32G X 4 MM MISC   4   No current facility-administered  medications for this visit.      Objective:     Vitals:   04/19/21 0811  BP: 122/82  Pulse: 63  SpO2: 97%  Weight: 220 lb (99.8 kg)  Height: 5\' 2"  (1.575 m)      Body mass index is 40.24 kg/m.    Physical Exam:     General: Well-appearing, cooperative, sitting comfortably in no acute distress.   OMT Physical Exam:  ASIS Compression Test: Positive Right Cervical: TTP paraspinal, C5-7 RRSR Rib: Bilateral elevated first rib with TTP Thoracic: TTP paraspinal, T3-7 RRSL Lumbar: TTP paraspinal, L1-3 RLSR Pelvis: Right anterior innominate  Left wrist:  No deformity or swelling appreciated. Negative Tinel's  Electronically signed by:  Tabitha [name] D.[name] 8:49 AM 04/19/21

## 2021-04-19 ENCOUNTER — Ambulatory Visit: Payer: 59 | Admitting: Sports Medicine

## 2021-04-19 ENCOUNTER — Other Ambulatory Visit: Payer: Self-pay

## 2021-04-19 ENCOUNTER — Other Ambulatory Visit (HOSPITAL_BASED_OUTPATIENT_CLINIC_OR_DEPARTMENT_OTHER): Payer: Self-pay

## 2021-04-19 VITALS — BP 122/82 | HR 63 | Ht 62.0 in | Wt 220.0 lb

## 2021-04-19 DIAGNOSIS — M542 Cervicalgia: Secondary | ICD-10-CM

## 2021-04-19 DIAGNOSIS — M9902 Segmental and somatic dysfunction of thoracic region: Secondary | ICD-10-CM

## 2021-04-19 DIAGNOSIS — M9905 Segmental and somatic dysfunction of pelvic region: Secondary | ICD-10-CM | POA: Diagnosis not present

## 2021-04-19 DIAGNOSIS — M9908 Segmental and somatic dysfunction of rib cage: Secondary | ICD-10-CM | POA: Diagnosis not present

## 2021-04-19 DIAGNOSIS — M9903 Segmental and somatic dysfunction of lumbar region: Secondary | ICD-10-CM

## 2021-04-19 DIAGNOSIS — M9901 Segmental and somatic dysfunction of cervical region: Secondary | ICD-10-CM | POA: Diagnosis not present

## 2021-04-19 NOTE — Patient Instructions (Signed)
Good to see you   Keep 05/13/21 appointment with Dr. Tamala Julian

## 2021-04-21 ENCOUNTER — Other Ambulatory Visit (HOSPITAL_COMMUNITY): Payer: Self-pay

## 2021-04-22 DIAGNOSIS — H2513 Age-related nuclear cataract, bilateral: Secondary | ICD-10-CM | POA: Diagnosis not present

## 2021-04-22 DIAGNOSIS — H40013 Open angle with borderline findings, low risk, bilateral: Secondary | ICD-10-CM | POA: Diagnosis not present

## 2021-04-22 DIAGNOSIS — E119 Type 2 diabetes mellitus without complications: Secondary | ICD-10-CM | POA: Diagnosis not present

## 2021-04-25 ENCOUNTER — Other Ambulatory Visit (HOSPITAL_COMMUNITY): Payer: Self-pay

## 2021-04-27 ENCOUNTER — Other Ambulatory Visit (HOSPITAL_BASED_OUTPATIENT_CLINIC_OR_DEPARTMENT_OTHER): Payer: Self-pay

## 2021-04-27 MED ORDER — VYVANSE 40 MG PO CAPS
ORAL_CAPSULE | ORAL | 0 refills | Status: DC
  Filled 2021-04-27: qty 30, 30d supply, fill #0

## 2021-05-09 ENCOUNTER — Encounter: Payer: Self-pay | Admitting: Family Medicine

## 2021-05-10 DIAGNOSIS — M545 Low back pain, unspecified: Secondary | ICD-10-CM | POA: Diagnosis not present

## 2021-05-12 ENCOUNTER — Other Ambulatory Visit (HOSPITAL_COMMUNITY): Payer: Self-pay

## 2021-05-12 ENCOUNTER — Ambulatory Visit: Payer: 59 | Admitting: Family Medicine

## 2021-05-12 ENCOUNTER — Encounter: Payer: Self-pay | Admitting: Family Medicine

## 2021-05-12 ENCOUNTER — Other Ambulatory Visit (HOSPITAL_BASED_OUTPATIENT_CLINIC_OR_DEPARTMENT_OTHER): Payer: Self-pay

## 2021-05-12 VITALS — BP 136/63 | HR 77 | Temp 97.1°F | Ht 62.0 in | Wt 213.8 lb

## 2021-05-12 DIAGNOSIS — L4050 Arthropathic psoriasis, unspecified: Secondary | ICD-10-CM | POA: Diagnosis not present

## 2021-05-12 DIAGNOSIS — F988 Other specified behavioral and emotional disorders with onset usually occurring in childhood and adolescence: Secondary | ICD-10-CM

## 2021-05-12 DIAGNOSIS — E0865 Diabetes mellitus due to underlying condition with hyperglycemia: Secondary | ICD-10-CM

## 2021-05-12 DIAGNOSIS — Z23 Encounter for immunization: Secondary | ICD-10-CM

## 2021-05-12 DIAGNOSIS — G4733 Obstructive sleep apnea (adult) (pediatric): Secondary | ICD-10-CM

## 2021-05-12 LAB — POCT GLYCOSYLATED HEMOGLOBIN (HGB A1C): Hemoglobin A1C: 5.8 % — AB (ref 4.0–5.6)

## 2021-05-12 MED ORDER — FLUTICASONE PROPIONATE 50 MCG/ACT NA SUSP
NASAL | 11 refills | Status: DC
  Filled 2021-05-12 (×2): qty 16, 30d supply, fill #0
  Filled 2021-06-17: qty 16, 30d supply, fill #1
  Filled 2021-07-04 – 2021-08-16 (×3): qty 16, 30d supply, fill #2
  Filled 2021-11-14: qty 16, 30d supply, fill #3
  Filled 2022-03-07: qty 16, 30d supply, fill #4

## 2021-05-12 MED ORDER — CLOTRIMAZOLE-BETAMETHASONE 1-0.05 % EX CREA
1.0000 "application " | TOPICAL_CREAM | Freq: Two times a day (BID) | CUTANEOUS | 0 refills | Status: DC
  Filled 2021-05-12: qty 30, 15d supply, fill #0
  Filled 2021-05-12: qty 30, 14d supply, fill #0

## 2021-05-12 NOTE — Patient Instructions (Addendum)
Great to see you today! Try lotrisone cream Continue current medications.  See me again in about 6 months.

## 2021-05-12 NOTE — Progress Notes (Signed)
Tabitha [name] - 55 y.o. female MRN [id number]  Date of birth: 04-Sep-1965  Subjective Chief Complaint  Patient presents with   Establish Care   Diabetes    HPI Tabitha [name] is a 55 year old female here today for initial visit to establish care.  She has history of psoriatic arthritis, type 2 diabetes, OSA, osteoarthritis and ADD.  She was kindly referred to our clinic by Dr. Gardenia Phlegm who she sees for OMT and sports medicine concerns.  Her psoriatic arthritis is managed by rheumatology.  Currently taking Taltz.  She had previously been on methotrexate in addition to this however developed rash earlier this year and was not sure if this was related to methotrexate use.  She had also received COVID booster around that time.  She does have topical medications that he uses for cutaneous manifestations of psoriasis.  These are working fairly well.  She has had recurrent rash in her underarms that improves with steroid and antifungal however never fully resolves.  She has been seeing endocrinology for management of her diabetes however would like primary care provider to take over management of this.  Her blood sugars have been well controlled with Synjardy.  She has also been working hard on dietary changes and increased exercise.  She sees pulmonology for management of her sleep apnea.  Vyvanse and Adderall are managed by [place] attention specialist.  ROS:  A comprehensive ROS was completed and negative except as noted per HPI   Allergies  Allergen Reactions   Chocolate Anaphylaxis   Food     chocolate   Statins     Intolerant  lft elevation   Latex Rash    Past Medical History:  Diagnosis Date   Arthritis    Asthma    De Quervain's disease (tenosynovitis)    Dermatophytosis, scalp    Diabetes mellitus    Hypercholesteremia    Obesity    Polycystic ovarian disease    Psoriatic arthritis ([place])    Wears glasses     Past Surgical History:  Procedure Laterality Date   BREAST BIOPSY  Right    CARPAL TUNNEL RELEASE     COLONOSCOPY     DE QUERVAIN'S RELEASE  2012   left   KNEE ARTHROPLASTY     KNEE ARTHROSCOPY Right 05/06/2013   Procedure: RIGHT KNEE ARTHROSCOPY ;  Surgeon: Hessie Dibble, MD;  Location: [facility];  Service: Orthopedics;  Laterality: Right;  partial lateral minisectomy and chondroplasty   WISDOM TOOTH EXTRACTION     WRIST SURGERY  2001   carpel tunnel -rt    Social History   Socioeconomic History   Marital status: Divorced    Spouse name: Not on file   Number of children: Not on file   Years of education: Not on file   Highest education level: Not on file  Occupational History   Occupation: nurse    Employer: [place]: at [employer]  Tobacco Use   Smoking status: Former    Packs/day: 1.30    Years: 12.00    Pack years: 15.60    Types: Cigarettes    Start date: 06/12/1984    Quit date: 03/18/1995    Years since quitting: 26.1   Smokeless tobacco: Never   Tobacco comments:    Not interested in returning to smoking.   Vaping Use   Vaping Use: Never used  Substance and Sexual Activity   Alcohol use: Yes    Comment: rarely  Drug use: No   Sexual activity: Yes    Partners: Male    Comment: married  Other Topics Concern   Not on file  Social History Narrative   Not on file   Social Determinants of Health   Financial Resource Strain: Not on file  Food Insecurity: Not on file  Transportation Needs: Not on file  Physical Activity: Not on file  Stress: Not on file  Social Connections: Not on file    Family History  Problem Relation Age of Onset   Diabetes Father    Hypertension Father    Stroke Father    Kidney disease Father    Heart disease Father    Cancer Mother 24       breast ca died at age 63   Breast cancer Mother 65   Hypertension Other    Diabetes type II Other    Obesity Other    Obesity Brother    Hypertension Brother    Heart disease Maternal Grandmother    Alzheimer's  disease Maternal Grandmother    Diabetes Paternal Grandmother    Heart disease Paternal Grandfather     Health Maintenance  Topic Date Due   Hepatitis C Screening  Never done   Zoster Vaccines- Shingrix (1 of 2) Never done   PAP SMEAR-Modifier  05/05/2013   COVID-19 Vaccine (5 - Booster for Pfizer series) 12/16/2020   TETANUS/TDAP  11/26/2021 (Originally 06/12/2020)   OPHTHALMOLOGY EXAM  09/10/2021   MAMMOGRAM  11/02/2021   HEMOGLOBIN A1C  11/10/2021   FOOT EXAM  11/26/2021   URINE MICROALBUMIN  11/26/2021   COLONOSCOPY (Pts 45-47yrs Insurance coverage will need to be confirmed)  12/27/2021   Pneumococcal Vaccine 65-63 Years old  Completed   INFLUENZA VACCINE  Completed   HIV Screening  Completed   HPV VACCINES  Aged Out     ----------------------------------------------------------------------------------------------------------------------------------------------------------------------------------------------------------------- Physical Exam BP 136/63 (BP Location: Left Arm, Patient Position: Sitting, Cuff Size: Large)   Pulse 77   Temp (!) 97.1 F (36.2 C)   Ht 5\' 2"  (1.575 m)   Wt 213 lb 12.8 oz (97 kg)   SpO2 99%   BMI 39.10 kg/m   Physical Exam Constitutional:      Appearance: Normal appearance.  HENT:     Head: Normocephalic and atraumatic.  Cardiovascular:     Rate and Rhythm: Normal rate and regular rhythm.  Pulmonary:     Effort: Pulmonary effort is normal.     Breath sounds: Normal breath sounds.  Musculoskeletal:     Cervical back: Neck supple.  Neurological:     Mental Status: She is alert.  Psychiatric:        Mood and Affect: Mood normal.        Behavior: Behavior normal.    ------------------------------------------------------------------------------------------------------------------------------------------------------------------------------------------------------------------- Assessment and Plan  OSA (obstructive sleep apnea) Management  per pulmonology.  Stable at this time.  Diabetes mellitus due to underlying condition with hyperglycemia ([place]) Her diabetes remains well controlled.  A1c has further improved since last check.  She will continue current medications as well as dietary and lifestyle change.  Psoriatic arthritis ([place]) Management per rheumatology.  Currently taking Taltz.  She does have topical medications for skin manifestations as well.  ADD (attention deficit disorder) Managed by [place] [facility].  She reports stable symptoms with current medications.    Meds ordered this encounter  Medications   clotrimazole-betamethasone (LOTRISONE) cream    Sig: Apply 1 application on to the skin 2 (two) times  daily.    Dispense:  30 g    Refill:  0   fluticasone (FLONASE) 50 MCG/ACT nasal spray    Sig: PLACE 2 SPRAYS INTO EACH NOSTRIL AT BEDTIME    Dispense:  16 g    Refill:  11    Return in about 6 months (around 11/10/2021) for T2DM.    This visit occurred during the SARS-CoV-2 public health emergency.  Safety protocols were in place, including screening questions prior to the visit, additional usage of staff PPE, and extensive cleaning of exam room while observing appropriate contact time as indicated for disinfecting solutions.

## 2021-05-12 NOTE — Assessment & Plan Note (Signed)
Management per pulmonology.  Stable at this time.

## 2021-05-12 NOTE — Assessment & Plan Note (Signed)
Her diabetes remains well controlled.  A1c has further improved since last check.  She will continue current medications as well as dietary and lifestyle change.

## 2021-05-12 NOTE — Assessment & Plan Note (Signed)
Management per rheumatology.  Currently taking Taltz.  She does have topical medications for skin manifestations as well.

## 2021-05-12 NOTE — Assessment & Plan Note (Addendum)
Managed by [place] [facility].  She reports stable symptoms with current medications.

## 2021-05-12 NOTE — Progress Notes (Signed)
[place] Phone: [phone] Subjective:   [name], am serving as a scribe for Dr. Hulan Saas.  This visit occurred during the SARS-CoV-2 public health emergency.  Safety protocols were in place, including screening questions prior to the visit, additional usage of staff PPE, and extensive cleaning of exam room while observing appropriate contact time as indicated for disinfecting solutions.   I'm seeing this patient by the request  of:  Luetta Nutting, DO  CC: Low back pain, knee pain, shoulder pain  F[facility]:[id number]  Tabitha [name] is a 55 y.o. female coming in with complaint of back and neck pain. OMT with Dr. Glennon Mac on 04/19/2021. Patient states that her R knee pain has improved but did have pain over medial aspect for past 2 weeks. Sees a PT at [facility]. Had dry needling which helped knee pain. Patient wants to know what happened to her knee.   Shoulder pain has also improved over past 2 weeks. Tenderness in [facility] joint is still present. ROM work has improved pain.   Notes losing weight and that she is now able to feel her coccyx and is having discomfort in that area. No injury.   Medications patient has been prescribed: None  Taking:         Reviewed prior external information including notes and imaging from previsou exam, outside providers and external EMR if available.   As well as notes that were available from care everywhere and other healthcare systems.  Past medical history, social, surgical and family history all reviewed in electronic medical record.  No pertanent information unless stated regarding to the chief complaint.   Past Medical History:  Diagnosis Date   Arthritis    Asthma    De Quervain's disease (tenosynovitis)    Dermatophytosis, scalp    Diabetes mellitus    Hypercholesteremia    Obesity    Polycystic ovarian disease    Psoriatic arthritis ([facility])    Wears glasses      Allergies  Allergen Reactions   Chocolate Anaphylaxis   Food     chocolate   Statins     Intolerant  lft elevation   Latex Rash     Review of Systems:  No headache, visual changes, nausea, vomiting, diarrhea, constipation, dizziness, abdominal pain, skin rash, fevers, chills, night sweats, weight loss, swollen lymph nodes, body aches, joint swelling, chest pain, shortness of breath, mood changes. POSITIVE muscle aches  Objective  Blood pressure 110/78, pulse 80, height 5\' 2"  (1.575 m), weight 213 lb (96.6 kg), SpO2 99 %.   General: No apparent distress alert and oriented x3 mood and affect normal, dressed appropriately.  HEENT: Pupils equal, extraocular movements intact  Respiratory: Patient's speak in full sentences and does not appear short of breath  Cardiovascular: No lower extremity edema, non tender, no erythema  Patient is tender to palpation in multiple different areas more than usual.  Back exam does have some loss of lordosis.  Tightness noted in the neck bilaterally as well.  Patient's knees do have effusions noted bilaterally.  Lacks last 5 to 10 degrees of flexion.  Osteopathic findings  C5 flexed rotated and side bent left T5 extended rotated and side bent right inhaled rib T7 extended rotated and side bent left L2 flexed rotated and side bent right Sacrum right on right  Limited muscular skeletal ultrasound was performed and interpreted by Hulan Saas, M  Limited ultrasound of patient's  knees bilaterally do show that there is a significant synovitis noted with some hypoechoic changes consistent with effusion.  Moderate to severe narrowing of the patellofemoral joint also noted.     Assessment and Plan:  Psoriatic arthritis ([place]) Psoriatic arthritic changes noted.  Patient is likely having exacerbation noted as well.  Discussed with patient about this and will start on prednisone.  Still responds well though to osteopathic manipulation.  Chronic problem  with exacerbation.  Does see her rheumatologist fairly regularly as well.  Degenerative arthritis of knee, bilateral On ultrasound seems to be more of a flare from the psoriatic arthritis with the synovitis.  We will hold on injections but if continuing to have pain would be the next step.  Last injections were in July 2020 and these were PRP   Nonallopathic problems  Decision today to treat with OMT was based on Physical Exam  After verbal consent patient was treated with HVLA, ME, FPR techniques in cervical, rib, thoracic, lumbar, and sacral  areas  Patient tolerated the procedure well with improvement in symptoms  Patient given exercises, stretches and lifestyle modifications  See medications in patient instructions if given  Patient will follow up in 4-8 weeks      The above documentation has been reviewed and is accurate and complete Lyndal Pulley, DO       Note: This dictation was prepared with Dragon dictation along with smaller phrase technology. Any transcriptional errors that result from this process are unintentional.

## 2021-05-13 ENCOUNTER — Other Ambulatory Visit: Payer: Self-pay

## 2021-05-13 ENCOUNTER — Ambulatory Visit: Payer: Self-pay

## 2021-05-13 ENCOUNTER — Other Ambulatory Visit (HOSPITAL_BASED_OUTPATIENT_CLINIC_OR_DEPARTMENT_OTHER): Payer: Self-pay

## 2021-05-13 ENCOUNTER — Encounter: Payer: Self-pay | Admitting: Family Medicine

## 2021-05-13 ENCOUNTER — Ambulatory Visit: Payer: 59 | Admitting: Family Medicine

## 2021-05-13 VITALS — BP 110/78 | HR 80 | Ht 62.0 in | Wt 213.0 lb

## 2021-05-13 DIAGNOSIS — M170 Bilateral primary osteoarthritis of knee: Secondary | ICD-10-CM

## 2021-05-13 DIAGNOSIS — M9902 Segmental and somatic dysfunction of thoracic region: Secondary | ICD-10-CM | POA: Diagnosis not present

## 2021-05-13 DIAGNOSIS — M9903 Segmental and somatic dysfunction of lumbar region: Secondary | ICD-10-CM

## 2021-05-13 DIAGNOSIS — M9904 Segmental and somatic dysfunction of sacral region: Secondary | ICD-10-CM | POA: Diagnosis not present

## 2021-05-13 DIAGNOSIS — M9901 Segmental and somatic dysfunction of cervical region: Secondary | ICD-10-CM | POA: Diagnosis not present

## 2021-05-13 DIAGNOSIS — M25512 Pain in left shoulder: Secondary | ICD-10-CM

## 2021-05-13 DIAGNOSIS — M19012 Primary osteoarthritis, left shoulder: Secondary | ICD-10-CM

## 2021-05-13 DIAGNOSIS — G8929 Other chronic pain: Secondary | ICD-10-CM | POA: Diagnosis not present

## 2021-05-13 DIAGNOSIS — L4050 Arthropathic psoriasis, unspecified: Secondary | ICD-10-CM

## 2021-05-13 DIAGNOSIS — M25511 Pain in right shoulder: Secondary | ICD-10-CM | POA: Diagnosis not present

## 2021-05-13 MED ORDER — PREDNISONE 20 MG PO TABS
40.0000 mg | ORAL_TABLET | Freq: Every day | ORAL | 0 refills | Status: DC
  Filled 2021-05-13: qty 10, 5d supply, fill #0

## 2021-05-13 NOTE — Assessment & Plan Note (Signed)
Psoriatic arthritic changes noted.  Patient is likely having exacerbation noted as well.  Discussed with patient about this and will start on prednisone.  Still responds well though to osteopathic manipulation.  Chronic problem with exacerbation.  Does see her rheumatologist fairly regularly as well.

## 2021-05-13 NOTE — Patient Instructions (Signed)
Prednisone 40mg  for 5 days Ice is your friend Madaline Brilliant to stay active Listen to your body See me in 5 weeks

## 2021-05-13 NOTE — Assessment & Plan Note (Signed)
On ultrasound seems to be more of a flare from the psoriatic arthritis with the synovitis.  We will hold on injections but if continuing to have pain would be the next step.  Last injections were in July 2020 and these were PRP

## 2021-05-18 ENCOUNTER — Encounter: Payer: Self-pay | Admitting: Family Medicine

## 2021-05-18 ENCOUNTER — Other Ambulatory Visit (HOSPITAL_COMMUNITY): Payer: Self-pay

## 2021-05-20 ENCOUNTER — Telehealth (INDEPENDENT_AMBULATORY_CARE_PROVIDER_SITE_OTHER): Payer: 59 | Admitting: Family Medicine

## 2021-05-20 ENCOUNTER — Encounter: Payer: Self-pay | Admitting: Family Medicine

## 2021-05-20 DIAGNOSIS — U071 COVID-19: Secondary | ICD-10-CM | POA: Diagnosis not present

## 2021-05-20 NOTE — Progress Notes (Signed)
    Virtual Visit via Video Note  I connected with Tabitha [name] on 05/20/21 at  9:30 AM EST by a video enabled telemedicine application and verified that I am speaking with the correct person using two identifiers.   I discussed the limitations of evaluation and management by telemedicine and the availability of in person appointments. The patient expressed understanding and agreed to proceed.  Patient location: at home Provider location: in office  Subjective:    CC:  No chief complaint on file.   HPI: Started feeling bad on Wed afternoon after working out that AM.  + nasal congestion. + cough more dry than productive. + fever.  101.5. chest is tight. Using IBU and Tylenol. + sick contact with husband who tested Pos for COVID .    Past medical history, Surgical history, Family history not pertinant except as noted below, Social history, Allergies, and medications have been entered into the medical record, reviewed, and corrections made.    Objective:    General: Speaking clearly in complete sentences without any shortness of breath.  Alert and oriented x3.  Normal judgment. No apparent acute distress.    Impression and Recommendations:    Problem List Items Addressed This Visit   None Visit Diagnoses     COVID-19    -  Primary      Discussed COVID- 19 options, she is medium risk for complications. Recommend symptomatic care.  [place] current recommendations for quarantine.  She works from home and so does not need a work note today but I am happy to provide 1 if she does not feel well enough to work.    No orders of the defined types were placed in this encounter.   No orders of the defined types were placed in this encounter.    I discussed the assessment and treatment plan with the patient. The patient was provided an opportunity to ask questions and all were answered. The patient agreed with the plan and demonstrated an understanding of the instructions.   The  patient was advised to call back or seek an in-person evaluation if the symptoms worsen or if the condition fails to improve as anticipated.   Beatrice Lecher, MD

## 2021-05-23 ENCOUNTER — Other Ambulatory Visit (HOSPITAL_COMMUNITY): Payer: Self-pay

## 2021-05-23 ENCOUNTER — Other Ambulatory Visit (HOSPITAL_BASED_OUTPATIENT_CLINIC_OR_DEPARTMENT_OTHER): Payer: Self-pay

## 2021-05-23 MED ORDER — FREESTYLE LITE TEST VI STRP
ORAL_STRIP | 3 refills | Status: DC
  Filled 2021-05-23: qty 300, 90d supply, fill #0
  Filled 2022-03-07: qty 300, 90d supply, fill #1

## 2021-05-23 MED ORDER — CARESTART COVID-19 HOME TEST VI KIT
PACK | 0 refills | Status: DC
  Filled 2021-05-23: qty 4, 4d supply, fill #0

## 2021-05-23 MED ORDER — FREESTYLE LITE TEST VI STRP: ORAL_STRIP | 3 refills | Status: AC

## 2021-05-24 ENCOUNTER — Other Ambulatory Visit (HOSPITAL_COMMUNITY): Payer: Self-pay

## 2021-05-25 ENCOUNTER — Other Ambulatory Visit (HOSPITAL_COMMUNITY): Payer: Self-pay

## 2021-05-25 ENCOUNTER — Other Ambulatory Visit (HOSPITAL_BASED_OUTPATIENT_CLINIC_OR_DEPARTMENT_OTHER): Payer: Self-pay

## 2021-05-25 DIAGNOSIS — E119 Type 2 diabetes mellitus without complications: Secondary | ICD-10-CM | POA: Diagnosis not present

## 2021-05-25 DIAGNOSIS — Z7984 Long term (current) use of oral hypoglycemic drugs: Secondary | ICD-10-CM | POA: Diagnosis not present

## 2021-05-25 DIAGNOSIS — E669 Obesity, unspecified: Secondary | ICD-10-CM | POA: Diagnosis not present

## 2021-05-25 MED ORDER — SYNJARDY XR 10-1000 MG PO TB24
ORAL_TABLET | ORAL | 4 refills | Status: DC
  Filled 2021-05-25 – 2021-08-29 (×2): qty 90, 90d supply, fill #0
  Filled 2021-12-08: qty 90, 90d supply, fill #1
  Filled 2022-03-07: qty 90, 90d supply, fill #2

## 2021-05-26 ENCOUNTER — Ambulatory Visit: Payer: 59

## 2021-05-26 ENCOUNTER — Encounter: Payer: Self-pay | Admitting: Family Medicine

## 2021-05-26 DIAGNOSIS — F4323 Adjustment disorder with mixed anxiety and depressed mood: Secondary | ICD-10-CM | POA: Diagnosis not present

## 2021-05-27 DIAGNOSIS — M545 Low back pain, unspecified: Secondary | ICD-10-CM | POA: Diagnosis not present

## 2021-05-30 ENCOUNTER — Other Ambulatory Visit (HOSPITAL_COMMUNITY): Payer: Self-pay

## 2021-06-02 ENCOUNTER — Other Ambulatory Visit (HOSPITAL_BASED_OUTPATIENT_CLINIC_OR_DEPARTMENT_OTHER): Payer: Self-pay

## 2021-06-02 MED ORDER — VYVANSE 40 MG PO CAPS
40.0000 mg | ORAL_CAPSULE | Freq: Every day | ORAL | 0 refills | Status: DC
  Filled 2021-06-02: qty 30, 30d supply, fill #0

## 2021-06-03 ENCOUNTER — Other Ambulatory Visit (HOSPITAL_BASED_OUTPATIENT_CLINIC_OR_DEPARTMENT_OTHER): Payer: Self-pay

## 2021-06-07 DIAGNOSIS — M545 Low back pain, unspecified: Secondary | ICD-10-CM | POA: Diagnosis not present

## 2021-06-13 ENCOUNTER — Encounter: Payer: Self-pay | Admitting: Family Medicine

## 2021-06-13 DIAGNOSIS — F4323 Adjustment disorder with mixed anxiety and depressed mood: Secondary | ICD-10-CM | POA: Diagnosis not present

## 2021-06-16 NOTE — Progress Notes (Signed)
[facility] [place] Phone: [phone] Subjective:   [name], am serving as a scribe for Dr. Hulan Saas.This visit occurred during the SARS-CoV-2 public health emergency.  Safety protocols were in place, including screening questions prior to the visit, additional usage of staff PPE, and extensive cleaning of exam room while observing appropriate contact time as indicated for disinfecting solutions.  I'm seeing this patient by the request  of:  Luetta Nutting, DO  CC: Low back pain, shoulder pain.  EPP:[id number]  05/12/2021 On ultrasound seems to be more of a flare from the psoriatic arthritis with the synovitis.  We will hold on injections but if continuing to have pain would be the next step.  Last injections were in July 2020 and these were PRP  Psoriatic arthritic changes noted.  Patient is likely having exacerbation noted as well.  Discussed with patient about this and will start on prednisone.  Still responds well though to osteopathic manipulation.  Chronic problem with exacerbation.  Does see her rheumatologist fairly regularly as well.  Updated 06/17/2021 THERISA MENNELLA is a 56 y.o. female coming in with complaint of knee pain improved following steroids last visit.   B shoulder pain deep in joints. L>R. Pain with movements and feels that her range is limited. Pain is not worse than last visit but not better.  Patient has known arthritic changes of the acromioclavicular joint.  Patient states certain range of motion gives her difficulty.  Sometimes uncomfortable at night but seems to stop her from sleeping well at this point.  Coccyx pain is same as last visit. Pain occurring at night.        Past Medical History:  Diagnosis Date   Arthritis    Asthma    De Quervain's disease (tenosynovitis)    Dermatophytosis, scalp    Diabetes mellitus    Hypercholesteremia    Obesity    Polycystic ovarian disease     Psoriatic arthritis ()    Wears glasses    Past Surgical History:  Procedure Laterality Date   BREAST BIOPSY Right    CARPAL TUNNEL RELEASE     COLONOSCOPY     DE QUERVAIN'S RELEASE  2012   left   KNEE ARTHROPLASTY     KNEE ARTHROSCOPY Right 05/06/2013   Procedure: RIGHT KNEE ARTHROSCOPY ;  Surgeon: Hessie Dibble, MD;  Location: [place];  Service: Orthopedics;  Laterality: Right;  partial lateral minisectomy and chondroplasty   WISDOM TOOTH EXTRACTION     WRIST SURGERY  2001   carpel tunnel -rt   Social History   Socioeconomic History   Marital status: Divorced    Spouse name: Not on file   Number of children: Not on file   Years of education: Not on file   Highest education level: Not on file  Occupational History   Occupation: nurse    Employer: [place] [place]: at [employer]  Tobacco Use   Smoking status: Former    Packs/day: 1.30    Years: 12.00    Pack years: 15.60    Types: Cigarettes    Start date: 06/12/1984    Quit date: 03/18/1995    Years since quitting: 26.2   Smokeless tobacco: Never   Tobacco comments:    Not interested in returning to smoking.   Vaping Use   Vaping Use: Never used  Substance and Sexual Activity   Alcohol  use: Yes    Comment: rarely   Drug use: No   Sexual activity: Yes    Partners: Male    Comment: married  Other Topics Concern   Not on file  Social History Narrative   Not on file   Social Determinants of Health   Financial Resource Strain: Not on file  Food Insecurity: Not on file  Transportation Needs: Not on file  Physical Activity: Not on file  Stress: Not on file  Social Connections: Not on file   Allergies  Allergen Reactions   Chocolate Anaphylaxis   Food     chocolate   Statins     Intolerant  lft elevation   Latex Rash   Family History  Problem Relation Age of Onset   Diabetes Father    Hypertension Father    Stroke Father    Kidney disease Father    Heart disease  Father    Cancer Mother 48       breast ca died at age 28   Breast cancer Mother 24   Hypertension Other    Diabetes type II Other    Obesity Other    Obesity Brother    Hypertension Brother    Heart disease Maternal Grandmother    Alzheimer's disease Maternal Grandmother    Diabetes Paternal Grandmother    Heart disease Paternal Grandfather     Current Outpatient Medications (Endocrine & Metabolic):    Empagliflozin-metFORMIN HCl ER (SYNJARDY XR) 03-999 MG TB24, Take 1 tablet by mouth with breakfast once a day   predniSONE (DELTASONE) 20 MG tablet, Take 2 tablets (40 mg total) by mouth daily with breakfast.   Current Outpatient Medications (Respiratory):    fluticasone (FLONASE) 50 MCG/ACT nasal spray, PLACE 2 SPRAYS INTO EACH NOSTRIL AT BEDTIME  Current Outpatient Medications (Analgesics):    Acetaminophen (TYLENOL PO), Take by mouth as needed.   aspirin EC 81 MG tablet, Take 81 mg by mouth daily.   celecoxib (CELEBREX) 100 MG capsule, TAKE 1 CAPSULE BY MOUTH 2 TIMES DAILY AS NEEDED FOR PAIN   colchicine 0.6 MG tablet, Take 1 tablet (0.6 mg total) by mouth daily.  Current Outpatient Medications (Hematological):    folic acid (FOLVITE) 1 MG tablet, TAKE 1 TABLET BY MOUTH TWO TIMES DAILY  Current Outpatient Medications (Other):    ACCU-CHEK GUIDE test strip,    amphetamine-dextroamphetamine (ADDERALL) 10 MG tablet, Take 1 tablet by mouth daily as directed   cholecalciferol (VITAMIN D3) 25 MCG (1000 UT) tablet, Take 1,000 Units by mouth daily.   clobetasol (TEMOVATE) 0.05 % external solution, Apply on to the skin 2 (two) times daily.   clobetasol cream (TEMOVATE) 4.09 %, Apply 1 application topically 2 (two) times daily. As directed; never apply to face.   clotrimazole-betamethasone (LOTRISONE) cream, Apply 1 application on to the skin 2 (two) times daily.   COVID-19 At Home Antigen Test [facility] COVID-19 HOME TEST) KIT, Use as directed per package instructions   desonide  (DESOWEN) 0.05 % cream, Apply topically 2 (two) times daily as needed.   Diclofenac Sodium (PENNSAID) 2 % SOLN, Place 2 application onto the skin 2 (two) times daily.   glucose blood (FREESTYLE LITE) test strip, Use to check blood sugar 3 times daily   glucose blood (FREESTYLE LITE) test strip, Use to check blood sugar 3 times a day   Ixekizumab (TALTZ) 80 MG/ML SOAJ, Inject 80 mg subcutaneously every 28 days   lisdexamfetamine (VYVANSE) 40 MG capsule, Take 1 capsule (40  mg total) by mouth daily.   Omega-3 Fatty Acids (FISH OIL) 1200 MG CAPS, Take 1-2 capsules (1,200-2,400 mg total) by mouth 2 (two) times daily. Takes 2400 mg qam and 1200 mg qpm   TURMERIC PO, Take by mouth 2 (two) times daily.    UNIFINE PENTIPS 32G X 4 MM MISC,    Reviewed prior external information including notes and imaging from  primary care provider As well as notes that were available from care everywhere and other healthcare systems.  Past medical history, social, surgical and family history all reviewed in electronic medical record.  No pertanent information unless stated regarding to the chief complaint.   Review of Systems:  No headache, visual changes, nausea, vomiting, diarrhea, constipation, dizziness, abdominal pain, skin rash, fevers, chills, night sweats, weight loss, swollen lymph nodes, body aches, joint swelling, chest pain, shortness of breath, mood changes. POSITIVE muscle aches  Objective  Blood pressure 118/64, pulse 91, height 5' 2" (1.575 m), weight 212 lb (96.2 kg), SpO2 98 %.   General: No apparent distress alert and oriented x3 mood and affect normal, dressed appropriately.  HEENT: Pupils equal, extraocular movements intact  Respiratory: Patient's speak in full sentences and does not appear short of breath  Cardiovascular: No lower extremity edema, non tender, no erythema  Gait normal with good balance and coordination.  MSK: Low back exam does have mild discomfort noted in the paraspinal  musculature.  Patient does have more pain in the coccyx area as well.  Patient has significant tightness of the sacrum bilaterally.  Difficulty with FABER test today.  Negative straight leg test. Shoulder exam does show that patient does have positive impingement bilaterally.  Osteopathic findings  C4 flexed rotated and side bent left C6 flexed rotated and side bent left T3 extended rotated and side bent right inhaled third rib T9 extended rotated and side bent left L2 flexed rotated and side bent right Sacrum right on right    Impression and Recommendations:     The above documentation has been reviewed and is accurate and complete Lyndal Pulley, DO

## 2021-06-17 ENCOUNTER — Other Ambulatory Visit: Payer: Self-pay

## 2021-06-17 ENCOUNTER — Other Ambulatory Visit (HOSPITAL_COMMUNITY): Payer: Self-pay

## 2021-06-17 ENCOUNTER — Encounter: Payer: Self-pay | Admitting: Family Medicine

## 2021-06-17 ENCOUNTER — Ambulatory Visit: Payer: 59 | Admitting: Family Medicine

## 2021-06-17 VITALS — BP 118/64 | HR 91 | Ht 62.0 in | Wt 212.0 lb

## 2021-06-17 DIAGNOSIS — M9903 Segmental and somatic dysfunction of lumbar region: Secondary | ICD-10-CM | POA: Diagnosis not present

## 2021-06-17 DIAGNOSIS — M9908 Segmental and somatic dysfunction of rib cage: Secondary | ICD-10-CM | POA: Diagnosis not present

## 2021-06-17 DIAGNOSIS — G8929 Other chronic pain: Secondary | ICD-10-CM | POA: Diagnosis not present

## 2021-06-17 DIAGNOSIS — M9902 Segmental and somatic dysfunction of thoracic region: Secondary | ICD-10-CM | POA: Diagnosis not present

## 2021-06-17 DIAGNOSIS — M9901 Segmental and somatic dysfunction of cervical region: Secondary | ICD-10-CM | POA: Diagnosis not present

## 2021-06-17 DIAGNOSIS — M19012 Primary osteoarthritis, left shoulder: Secondary | ICD-10-CM | POA: Diagnosis not present

## 2021-06-17 DIAGNOSIS — M9904 Segmental and somatic dysfunction of sacral region: Secondary | ICD-10-CM

## 2021-06-17 DIAGNOSIS — M999 Biomechanical lesion, unspecified: Secondary | ICD-10-CM | POA: Diagnosis not present

## 2021-06-17 DIAGNOSIS — M545 Low back pain, unspecified: Secondary | ICD-10-CM

## 2021-06-17 MED ORDER — COLCHICINE 0.6 MG PO TABS
0.6000 mg | ORAL_TABLET | Freq: Every day | ORAL | 0 refills | Status: DC
  Filled 2021-06-17: qty 30, 30d supply, fill #0

## 2021-06-17 NOTE — Assessment & Plan Note (Signed)
Known arthritic changes.  Patient wants to hold on injections but if has any increasing discomfort patient will consider possibility injections.

## 2021-06-17 NOTE — Assessment & Plan Note (Signed)

## 2021-06-17 NOTE — Assessment & Plan Note (Signed)
Patient is having a lot of low back pain at the moment.  He does also have what appears to be more of a coccydynia.  We discussed different treatment options and patient has elected to try colchicine.  Wants to avoid significant amount of oral prednisone secondary to patient's autoimmune.  Patient is encouraged to continue to work on the weight loss.  Follow-up with me in 6 to 8 weeks

## 2021-06-17 NOTE — Patient Instructions (Addendum)
Hold celebrex Send message in 1 week See me in 4-5 weeks

## 2021-06-18 ENCOUNTER — Other Ambulatory Visit (HOSPITAL_COMMUNITY): Payer: Self-pay

## 2021-06-18 ENCOUNTER — Encounter: Payer: Self-pay | Admitting: Family Medicine

## 2021-06-20 DIAGNOSIS — M545 Low back pain, unspecified: Secondary | ICD-10-CM | POA: Diagnosis not present

## 2021-06-21 ENCOUNTER — Other Ambulatory Visit: Payer: Self-pay | Admitting: Pharmacist

## 2021-06-21 ENCOUNTER — Other Ambulatory Visit (HOSPITAL_COMMUNITY): Payer: Self-pay

## 2021-06-21 MED ORDER — TALTZ 80 MG/ML ~~LOC~~ SOAJ
SUBCUTANEOUS | 5 refills | Status: DC
  Filled 2021-06-21: qty 1, 28d supply, fill #0
  Filled 2021-07-20: qty 1, 28d supply, fill #1
  Filled 2021-08-15: qty 1, 28d supply, fill #2
  Filled 2021-09-13: qty 1, 28d supply, fill #3
  Filled 2021-10-13: qty 1, 28d supply, fill #4
  Filled 2021-11-10: qty 1, 28d supply, fill #5

## 2021-06-21 MED ORDER — TALTZ 80 MG/ML ~~LOC~~ SOAJ: SUBCUTANEOUS | 5 refills | Status: DC

## 2021-06-27 ENCOUNTER — Encounter: Payer: Self-pay | Admitting: Family Medicine

## 2021-06-29 ENCOUNTER — Other Ambulatory Visit (HOSPITAL_COMMUNITY): Payer: Self-pay

## 2021-07-04 ENCOUNTER — Other Ambulatory Visit (HOSPITAL_BASED_OUTPATIENT_CLINIC_OR_DEPARTMENT_OTHER): Payer: Self-pay

## 2021-07-04 DIAGNOSIS — F4323 Adjustment disorder with mixed anxiety and depressed mood: Secondary | ICD-10-CM | POA: Diagnosis not present

## 2021-07-04 MED ORDER — VYVANSE 40 MG PO CAPS
ORAL_CAPSULE | ORAL | 0 refills | Status: DC
  Filled 2021-07-04: qty 30, 30d supply, fill #0

## 2021-07-05 ENCOUNTER — Other Ambulatory Visit (HOSPITAL_BASED_OUTPATIENT_CLINIC_OR_DEPARTMENT_OTHER): Payer: Self-pay

## 2021-07-11 DIAGNOSIS — M545 Low back pain, unspecified: Secondary | ICD-10-CM | POA: Diagnosis not present

## 2021-07-12 ENCOUNTER — Other Ambulatory Visit (HOSPITAL_BASED_OUTPATIENT_CLINIC_OR_DEPARTMENT_OTHER): Payer: Self-pay

## 2021-07-19 ENCOUNTER — Other Ambulatory Visit (HOSPITAL_COMMUNITY): Payer: Self-pay

## 2021-07-19 DIAGNOSIS — Z79899 Other long term (current) drug therapy: Secondary | ICD-10-CM | POA: Diagnosis not present

## 2021-07-19 DIAGNOSIS — L4050 Arthropathic psoriasis, unspecified: Secondary | ICD-10-CM | POA: Diagnosis not present

## 2021-07-19 DIAGNOSIS — M159 Polyosteoarthritis, unspecified: Secondary | ICD-10-CM | POA: Diagnosis not present

## 2021-07-19 DIAGNOSIS — L409 Psoriasis, unspecified: Secondary | ICD-10-CM | POA: Diagnosis not present

## 2021-07-20 ENCOUNTER — Other Ambulatory Visit (HOSPITAL_COMMUNITY): Payer: Self-pay

## 2021-07-21 NOTE — Progress Notes (Signed)
[facility] [place] [address] [place] Phone: [phone] Subjective:   Tabitha [name], am serving as a scribe for Dr. Hulan Saas. This visit occurred during the SARS-CoV-2 public health emergency.  Safety protocols were in place, including screening questions prior to the visit, additional usage of staff PPE, and extensive cleaning of exam room while observing appropriate contact time as indicated for disinfecting solutions.   I'm seeing this patient by the request  of:  Luetta Nutting, DO  CC: Shoulder pain, back pain follow-up  Tabitha [name]  Tabitha [name] is a 56 y.o. female coming in with complaint of back and neck pain. OMT on 06/17/2021. Patient states same per usual. Wants you to look at shoulder. No new complaints.  Medications patient has been prescribed: Prednisone  Taking: No         Reviewed prior external information including notes and imaging from previsou exam, outside providers and external EMR if available.   As well as notes that were available from care everywhere and other healthcare systems.  Past medical history, social, surgical and family history all reviewed in electronic medical record.  No pertanent information unless stated regarding to the chief complaint.   Past Medical History:  Diagnosis Date   Arthritis    Asthma    De Quervain's disease (tenosynovitis)    Dermatophytosis, scalp    Diabetes mellitus    Hypercholesteremia    Obesity    Polycystic ovarian disease    Psoriatic arthritis ([facility])    Wears glasses     Allergies  Allergen Reactions   Chocolate Anaphylaxis   Food     chocolate   Statins     Intolerant  lft elevation   Latex Rash     Review of Systems:  No headache, visual changes, nausea, vomiting, diarrhea, constipation, dizziness, abdominal pain, skin rash, fevers, chills, night sweats, weight loss, swollen lymph nodes,  joint swelling, chest pain, shortness of breath, mood  changes. POSITIVE muscle aches, body aches  Objective  Blood pressure 114/68, pulse 92, height 5\' 2"  (1.575 m), weight 212 lb (96.2 kg), SpO2 97 %.   General: No apparent distress alert and oriented x3 mood and affect normal, dressed appropriately.  HEENT: Pupils equal, extraocular movements intact  Respiratory: Patient's speak in full sentences and does not appear short of breath  Cardiovascular: No lower extremity edema, non tender, no erythema  Low back Does have some increasing tightness in the thoracolumbar junction and minimally over the sacroiliac joints bilaterally.  Mild tightness with FABER test.  He still has trace effusion of the patella femoral joints bilaterally.  Patient does have some swelling over the acromioclavicular joints of the shoulder with positive crossover and positive impingement.  Rotator cuff strength is intact.  Limited muscular skeletal ultrasound was performed and interpreted by Hulan Saas, M  Limited ultrasound of the patient's acromioclavicular joint shows some hypoechoic changes bilaterally with moderate narrowing of the joint space itself.  Left shoulder does show the patient does have a potential for erosive changes of the humeral head.  Does not seem to be acute and not chronic  Osteopathic findings  C2 flexed rotated and side bent right C6 flexed rotated and side bent left T3 extended rotated and side bent right inhaled rib T9 extended rotated and side bent left L2 flexed rotated and side bent right Sacrum right on right       Assessment and Plan:  Arthritis of left acromioclavicular joint  Ultrasound shows the patient does have significant hypoechoic changes with some moderate to severe arthritis.  There is a possibility of erosive change noted of the humeral head on the left side.  Patient does have the underlying psoriatic arthritis is potentially contributing as well.  Encourage patient to continue to stay active is much as possible at the  moment.  Discussed icing regimen and home exercises.  Follow-up with me again in 4 to 6 weeks and worsening pain consider the possibility of injection  Low back pain Chronic problem with mild exacerbation.  Responding well to manipulation.  Discussed icing regimen and home exercises.  Continue work on the weight loss as well as the core strengthening.  No significant change in the medications.  We did discuss the possibility of naltrexone for pain.   Nonallopathic problems  Decision today to treat with OMT was based on Physical Exam  After verbal consent patient was treated with HVLA, ME, FPR techniques in cervical, rib, thoracic, lumbar, and sacral  areas  Patient tolerated the procedure well with improvement in symptoms  Patient given exercises, stretches and lifestyle modifications  See medications in patient instructions if given  Patient will follow up in 4-8 weeks      The above documentation has been reviewed and is accurate and complete Tabitha Pulley, DO        Note: This dictation was prepared with Dragon dictation along with smaller phrase technology. Any transcriptional errors that result from this process are unintentional.

## 2021-07-22 ENCOUNTER — Ambulatory Visit: Payer: Self-pay

## 2021-07-22 ENCOUNTER — Encounter: Payer: Self-pay | Admitting: Family Medicine

## 2021-07-22 ENCOUNTER — Other Ambulatory Visit (HOSPITAL_BASED_OUTPATIENT_CLINIC_OR_DEPARTMENT_OTHER): Payer: Self-pay

## 2021-07-22 ENCOUNTER — Other Ambulatory Visit: Payer: Self-pay

## 2021-07-22 ENCOUNTER — Ambulatory Visit: Payer: 59 | Admitting: Family Medicine

## 2021-07-22 VITALS — BP 114/68 | HR 92 | Ht 62.0 in | Wt 212.0 lb

## 2021-07-22 DIAGNOSIS — M9908 Segmental and somatic dysfunction of rib cage: Secondary | ICD-10-CM

## 2021-07-22 DIAGNOSIS — M9904 Segmental and somatic dysfunction of sacral region: Secondary | ICD-10-CM

## 2021-07-22 DIAGNOSIS — M25511 Pain in right shoulder: Secondary | ICD-10-CM

## 2021-07-22 DIAGNOSIS — M545 Low back pain, unspecified: Secondary | ICD-10-CM | POA: Diagnosis not present

## 2021-07-22 DIAGNOSIS — G8929 Other chronic pain: Secondary | ICD-10-CM | POA: Diagnosis not present

## 2021-07-22 DIAGNOSIS — M19012 Primary osteoarthritis, left shoulder: Secondary | ICD-10-CM

## 2021-07-22 DIAGNOSIS — M9903 Segmental and somatic dysfunction of lumbar region: Secondary | ICD-10-CM

## 2021-07-22 DIAGNOSIS — M9901 Segmental and somatic dysfunction of cervical region: Secondary | ICD-10-CM | POA: Diagnosis not present

## 2021-07-22 DIAGNOSIS — M25512 Pain in left shoulder: Secondary | ICD-10-CM | POA: Diagnosis not present

## 2021-07-22 DIAGNOSIS — M9902 Segmental and somatic dysfunction of thoracic region: Secondary | ICD-10-CM

## 2021-07-22 NOTE — Assessment & Plan Note (Signed)
Ultrasound shows the patient does have significant hypoechoic changes with some moderate to severe arthritis.  There is a possibility of erosive change noted of the humeral head on the left side.  Patient does have the underlying psoriatic arthritis is potentially contributing as well.  Encourage patient to continue to stay active is much as possible at the moment.  Discussed icing regimen and home exercises.  Follow-up with me again in 4 to 6 weeks and worsening pain consider the possibility of injection

## 2021-07-22 NOTE — Patient Instructions (Addendum)
Lets watch the shoulders Keep reading about Naltrexone Do not wear apple watch See you again in 5 weeks

## 2021-07-22 NOTE — Assessment & Plan Note (Signed)
Chronic problem with mild exacerbation.  Responding well to manipulation.  Discussed icing regimen and home exercises.  Continue work on the weight loss as well as the core strengthening.  No significant change in the medications.  We did discuss the possibility of naltrexone for pain.

## 2021-07-25 ENCOUNTER — Other Ambulatory Visit (HOSPITAL_COMMUNITY): Payer: Self-pay

## 2021-08-02 DIAGNOSIS — F4323 Adjustment disorder with mixed anxiety and depressed mood: Secondary | ICD-10-CM | POA: Diagnosis not present

## 2021-08-03 DIAGNOSIS — G4733 Obstructive sleep apnea (adult) (pediatric): Secondary | ICD-10-CM | POA: Diagnosis not present

## 2021-08-08 ENCOUNTER — Other Ambulatory Visit (HOSPITAL_BASED_OUTPATIENT_CLINIC_OR_DEPARTMENT_OTHER): Payer: Self-pay

## 2021-08-08 MED ORDER — VYVANSE 40 MG PO CAPS
ORAL_CAPSULE | ORAL | 0 refills | Status: DC
  Filled 2021-08-08: qty 30, 30d supply, fill #0

## 2021-08-08 MED ORDER — AMPHETAMINE-DEXTROAMPHETAMINE 10 MG PO TABS
ORAL_TABLET | ORAL | 0 refills | Status: DC
  Filled 2021-08-08: qty 30, 30d supply, fill #0

## 2021-08-12 DIAGNOSIS — M545 Low back pain, unspecified: Secondary | ICD-10-CM | POA: Diagnosis not present

## 2021-08-15 ENCOUNTER — Other Ambulatory Visit (HOSPITAL_COMMUNITY): Payer: Self-pay

## 2021-08-16 ENCOUNTER — Other Ambulatory Visit: Payer: Self-pay | Admitting: Family Medicine

## 2021-08-17 ENCOUNTER — Other Ambulatory Visit (HOSPITAL_BASED_OUTPATIENT_CLINIC_OR_DEPARTMENT_OTHER): Payer: Self-pay

## 2021-08-17 MED ORDER — CLOTRIMAZOLE-BETAMETHASONE 1-0.05 % EX CREA
1.0000 "application " | TOPICAL_CREAM | Freq: Two times a day (BID) | CUTANEOUS | 0 refills | Status: DC
  Filled 2021-08-17: qty 30, 15d supply, fill #0

## 2021-08-22 ENCOUNTER — Other Ambulatory Visit (HOSPITAL_COMMUNITY): Payer: Self-pay

## 2021-08-29 ENCOUNTER — Ambulatory Visit: Payer: 59 | Attending: Internal Medicine

## 2021-08-29 ENCOUNTER — Other Ambulatory Visit (HOSPITAL_BASED_OUTPATIENT_CLINIC_OR_DEPARTMENT_OTHER): Payer: Self-pay

## 2021-08-29 ENCOUNTER — Encounter: Payer: Self-pay | Admitting: Family Medicine

## 2021-08-29 DIAGNOSIS — Z23 Encounter for immunization: Secondary | ICD-10-CM

## 2021-08-29 NOTE — Progress Notes (Signed)
? ?  Covid-19 Vaccination Clinic ? ?Name:  Tabitha [name]    ?MRN: [id number] ?DOB: June 18, 1965 ? ?08/29/2021 ? ?Ms. Akram was observed post Covid-19 immunization for 30 minutes based on pre-vaccination screening without incident. She was provided with Vaccine Information Sheet and instruction to access the V-Safe system.  ? ?Ms. Milos was instructed to call 911 with any severe reactions post vaccine: ?Difficulty breathing  ?Swelling of face and throat  ?A fast heartbeat  ?A bad rash all over body  ?Dizziness and weakness  ? ?Immunizations Administered   ? ? Name Date Dose VIS Date Route  ? Ambulance person Booster 08/29/2021  2:21 PM 0.3 mL 02/09/2021 Intramuscular  ? Manufacturer: [employer]: [id number]  ? [place]: [phone]  ? ?  ?  ?

## 2021-09-01 NOTE — Progress Notes (Signed)
?Charlann Boxer D.O. ?[facility] ?Tabitha [name] ?Phone: [phone] ?Subjective:   ?I, Tabitha [name], am serving as a scribe for Dr. Hulan Saas. ? ?This visit occurred during the SARS-CoV-2 public health emergency.  Safety protocols were in place, including screening questions prior to the visit, additional usage of staff PPE, and extensive cleaning of exam room while observing appropriate contact time as indicated for disinfecting solutions.  ? ? ?I'm seeing this patient by the request  of:  Luetta Nutting, DO ? ?CC: Neck pain, back pain, multiple joint pain follow-up ? ?BSJ:[id number]  ?Tabitha [name] is a 56 y.o. female coming in with complaint of back and neck pain. OMT on 07/22/2021. Patient states that she has been having R knee pain that is causing R hip and lumbar spine pain.  ? ?Pain persists in both shoulders. Pain is not constant but is not improving.  ? ?Patient states that her neck has been tight. Wonders if she is having a flare as her entire body is hurting.  ? ? ?Medications patient has been prescribed: colchicine ? ?Taking: ? ? ?  ? ? ? ? ?Reviewed prior external information including notes and imaging from previsou exam, outside providers and external EMR if available.  ? ?As well as notes that were available from care everywhere and other healthcare systems. ? ?Past medical history, social, surgical and family history all reviewed in electronic medical record.  No pertanent information unless stated regarding to the chief complaint.  ? ?Past Medical History:  ?Diagnosis Date  ? Arthritis   ? Asthma   ? De Quervain's disease (tenosynovitis)   ? Dermatophytosis, scalp   ? Diabetes mellitus   ? Hypercholesteremia   ? Obesity   ? Polycystic ovarian disease   ? Psoriatic arthritis ([place])   ? Wears glasses   ?  ?Allergies  ?Allergen Reactions  ? Chocolate Anaphylaxis  ? Food   ?  chocolate  ? Statins   ?  Intolerant  lft elevation  ? Latex Rash  ? ? ? ?Review of  Systems: ? No headache, visual changes, nausea, vomiting, diarrhea, constipation, dizziness, abdominal pain, skin rash, fevers, chills, night sweats, weight loss, swollen lymph nodes, body aches, joint swelling, chest pain, shortness of breath, mood changes. POSITIVE muscle aches ? ?Objective  ?Blood pressure 120/70, pulse 75, height '5\' 2"'$  (1.575 m), weight 216 lb (98 kg), SpO2 98 %. ?  ?General: No apparent distress alert and oriented x3 mood and affect normal, dressed appropriately.  ?HEENT: Pupils equal, extraocular movements intact  ?Respiratory: Patient's speak in full sentences and does not appear short of breath  ?Cardiovascular: No lower extremity edema, non tender, no erythema  ?Gait normal with good balance and coordination.  ?MSK:   ?Bilateral shoulders show the patient does have positive crossover.  Swelling over the acromioclavicular noted.  Mild impingement noted. ?Knee exam showed trace effusion bilaterally but right greater than left.  Tender to palpation over the right knee itself. ?Back -back exam does have significant loss of lordosis.  Patient does have tightness noted in all planes at the moment.  Patient does seem to have maybe some mild swelling noted over the sacroiliac joints bilaterally ? ?After informed written and verbal consent, patient was seated on exam table. Right knee was prepped with alcohol swab and utilizing anterolateral approach, patient's right knee space was injected with 4:1  marcaine 0.5%: Kenalog '40mg'$ /dL. Patient tolerated the procedure well without immediate complications. ? ?Osteopathic findings ? ?  C2 flexed rotated and side bent right ?C6 flexed rotated and side bent left ?T3 extended rotated and side bent right inhaled rib ?T9 extended rotated and side bent left ?L2 flexed rotated and side bent right ?Sacrum right on right ? ?Procedure: Real-time Ultrasound Guided Injection of right acromioclavicular joint ?Device: GE Logiq Q7 ?Ultrasound guided injection is preferred  based studies that show increased duration, increased effect, greater accuracy, decreased procedural pain, increased response rate, and decreased cost with ultrasound guided versus blind injection.  ?Verbal informed consent obtained.  ?Time-out conducted.  ?Noted no overlying erythema, induration, or other signs of local infection.  ?Skin prepped in a sterile fashion.  ?Local anesthesia: Topical Ethyl chloride.  ?With sterile technique and under real time ultrasound guidance: With a 25-gauge half inch needle injected with 0.5 cc of 0.5% Marcaine and injected with 0.5 cc of Kenalog 40 mg/mL. ?Completed without difficulty  ?Pain immediately resolved suggesting accurate placement of the medication.  ?Advised to call if fevers/chills, erythema, induration, drainage, or persistent bleeding.  ?Impression: Technically successful ultrasound guided injection. ? ?Procedure: Real-time Ultrasound Guided Injection of left acromioclavicular joint ?Device: GE Logiq Q7 ?Ultrasound guided injection is preferred based studies that show increased duration, increased effect, greater accuracy, decreased procedural pain, increased response rate, and decreased cost with ultrasound guided versus blind injection.  ?Verbal informed consent obtained.  ?Time-out conducted.  ?Noted no overlying erythema, induration, or other signs of local infection.  ?Skin prepped in a sterile fashion.  ?Local anesthesia: Topical Ethyl chloride.  ?With sterile technique and under real time ultrasound guidance: With a 25-gauge half inch needle injected with 0.5 cc of 0.5% Marcaine and 0.5 cc of Kenalog 40 mg/mL ?Completed without difficulty  ?Pain immediately resolved suggesting accurate placement of the medication.  ?Advised to call if fevers/chills, erythema, induration, drainage, or persistent bleeding.  ?Impression: Technically successful ultrasound guided injection. ?  ?Assessment and Plan: ? ?Degenerative arthritis of knee, bilateral ?Chronic problem with  exacerbation.  Given injection today.  Tolerated the procedure well, discussed icing regimen and home exercises.  Patient will continue to work on weight loss.  Has responded well to PRP previously and we discussed if repeating we will wait 5 weeks until repeating. ? ?Psoriatic arthritis ([place]) ?Concerned with all the inflammation of multiple joints the patient may be having a potential flare.  Manipulation of his autoimmune medication may not be working and may need to consider changing.  Patient to talk to her rheumatologist about this at her next follow-up. ? ?Arthritis of both acromioclavicular joints ?Bilateral injections given today, tolerated the procedure well, discussed with regimen and home exercises.  Increase activity slowly.  Follow-up again in 6 to 8 weeks ? ?Low back pain ?Chronic problem with exacerbation as well.  Discussed with patient the different treatment options and elected to continue with the conservative therapy with a home exercise, weight loss, as well as osteopathic manipulation.  We will follow-up again in 6 to 8 weeks  ? ?Nonallopathic problems ? ?Decision today to treat with OMT was based on Physical Exam ? ?After verbal consent patient was treated with HVLA, ME, FPR techniques in cervical, rib, thoracic, lumbar, and sacral  areas ? ?Patient tolerated the procedure well with improvement in symptoms ? ?Patient given exercises, stretches and lifestyle modifications ? ?See medications in patient instructions if given ? ?Patient will follow up in 4-8 weeks ? ?  ? ? ?The above documentation has been reviewed and is accurate and complete Lyndal Pulley, DO ? ? ? ?  ? ?  Note: This dictation was prepared with Dragon dictation along with smaller phrase technology. Any transcriptional errors that result from this process are unintentional.    ?  ?  ? ?

## 2021-09-02 ENCOUNTER — Ambulatory Visit: Payer: Self-pay

## 2021-09-02 ENCOUNTER — Other Ambulatory Visit (HOSPITAL_BASED_OUTPATIENT_CLINIC_OR_DEPARTMENT_OTHER): Payer: Self-pay

## 2021-09-02 ENCOUNTER — Encounter: Payer: Self-pay | Admitting: Family Medicine

## 2021-09-02 ENCOUNTER — Other Ambulatory Visit: Payer: Self-pay

## 2021-09-02 ENCOUNTER — Ambulatory Visit: Payer: 59 | Admitting: Family Medicine

## 2021-09-02 VITALS — BP 120/70 | HR 75 | Ht 62.0 in | Wt 216.0 lb

## 2021-09-02 DIAGNOSIS — M545 Low back pain, unspecified: Secondary | ICD-10-CM | POA: Diagnosis not present

## 2021-09-02 DIAGNOSIS — M9904 Segmental and somatic dysfunction of sacral region: Secondary | ICD-10-CM | POA: Diagnosis not present

## 2021-09-02 DIAGNOSIS — M19012 Primary osteoarthritis, left shoulder: Secondary | ICD-10-CM | POA: Diagnosis not present

## 2021-09-02 DIAGNOSIS — M25512 Pain in left shoulder: Secondary | ICD-10-CM | POA: Diagnosis not present

## 2021-09-02 DIAGNOSIS — M9908 Segmental and somatic dysfunction of rib cage: Secondary | ICD-10-CM | POA: Diagnosis not present

## 2021-09-02 DIAGNOSIS — L4050 Arthropathic psoriasis, unspecified: Secondary | ICD-10-CM | POA: Diagnosis not present

## 2021-09-02 DIAGNOSIS — M9901 Segmental and somatic dysfunction of cervical region: Secondary | ICD-10-CM | POA: Diagnosis not present

## 2021-09-02 DIAGNOSIS — M9902 Segmental and somatic dysfunction of thoracic region: Secondary | ICD-10-CM

## 2021-09-02 DIAGNOSIS — M19011 Primary osteoarthritis, right shoulder: Secondary | ICD-10-CM | POA: Diagnosis not present

## 2021-09-02 DIAGNOSIS — G8929 Other chronic pain: Secondary | ICD-10-CM

## 2021-09-02 DIAGNOSIS — M170 Bilateral primary osteoarthritis of knee: Secondary | ICD-10-CM | POA: Diagnosis not present

## 2021-09-02 DIAGNOSIS — M25511 Pain in right shoulder: Secondary | ICD-10-CM | POA: Diagnosis not present

## 2021-09-02 DIAGNOSIS — M9903 Segmental and somatic dysfunction of lumbar region: Secondary | ICD-10-CM | POA: Diagnosis not present

## 2021-09-02 DIAGNOSIS — F902 Attention-deficit hyperactivity disorder, combined type: Secondary | ICD-10-CM | POA: Diagnosis not present

## 2021-09-02 DIAGNOSIS — Z79899 Other long term (current) drug therapy: Secondary | ICD-10-CM | POA: Diagnosis not present

## 2021-09-02 MED ORDER — VYVANSE 60 MG PO CAPS
ORAL_CAPSULE | ORAL | 0 refills | Status: DC
  Filled 2021-09-02: qty 30, 30d supply, fill #0

## 2021-09-02 NOTE — Patient Instructions (Addendum)
Injected both shoulder and R knee ?Good to see you! ? ?See me again in 5-6 weeks ?

## 2021-09-02 NOTE — Assessment & Plan Note (Signed)
Concerned with all the inflammation of multiple joints the patient may be having a potential flare.  Manipulation of his autoimmune medication may not be working and may need to consider changing.  Patient to talk to her rheumatologist about this at her next follow-up. ?

## 2021-09-02 NOTE — Assessment & Plan Note (Signed)
Chronic problem with exacerbation.  Given injection today.  Tolerated the procedure well, discussed icing regimen and home exercises.  Patient will continue to work on weight loss.  Has responded well to PRP previously and we discussed if repeating we will wait 5 weeks until repeating. ?

## 2021-09-02 NOTE — Assessment & Plan Note (Signed)
Bilateral injections given today, tolerated the procedure well, discussed with regimen and home exercises.  Increase activity slowly.  Follow-up again in 6 to 8 weeks ?

## 2021-09-02 NOTE — Assessment & Plan Note (Signed)
Chronic problem with exacerbation as well.  Discussed with patient the different treatment options and elected to continue with the conservative therapy with a home exercise, weight loss, as well as osteopathic manipulation.  We will follow-up again in 6 to 8 weeks ?

## 2021-09-06 DIAGNOSIS — F4323 Adjustment disorder with mixed anxiety and depressed mood: Secondary | ICD-10-CM | POA: Diagnosis not present

## 2021-09-08 ENCOUNTER — Other Ambulatory Visit (HOSPITAL_COMMUNITY): Payer: Self-pay

## 2021-09-08 ENCOUNTER — Other Ambulatory Visit (HOSPITAL_BASED_OUTPATIENT_CLINIC_OR_DEPARTMENT_OTHER): Payer: Self-pay

## 2021-09-08 MED ORDER — PFIZER COVID-19 VAC BIVALENT 30 MCG/0.3ML IM SUSP
INTRAMUSCULAR | 0 refills | Status: DC
  Filled 2021-09-08: qty 0.3, 1d supply, fill #0

## 2021-09-13 ENCOUNTER — Other Ambulatory Visit (HOSPITAL_COMMUNITY): Payer: Self-pay

## 2021-09-13 DIAGNOSIS — Z780 Asymptomatic menopausal state: Secondary | ICD-10-CM | POA: Diagnosis not present

## 2021-09-13 DIAGNOSIS — L4050 Arthropathic psoriasis, unspecified: Secondary | ICD-10-CM | POA: Diagnosis not present

## 2021-09-13 DIAGNOSIS — F909 Attention-deficit hyperactivity disorder, unspecified type: Secondary | ICD-10-CM | POA: Diagnosis not present

## 2021-09-13 DIAGNOSIS — N952 Postmenopausal atrophic vaginitis: Secondary | ICD-10-CM | POA: Diagnosis not present

## 2021-09-13 DIAGNOSIS — Z01419 Encounter for gynecological examination (general) (routine) without abnormal findings: Secondary | ICD-10-CM | POA: Diagnosis not present

## 2021-09-19 ENCOUNTER — Other Ambulatory Visit (HOSPITAL_COMMUNITY): Payer: Self-pay

## 2021-09-19 DIAGNOSIS — F4323 Adjustment disorder with mixed anxiety and depressed mood: Secondary | ICD-10-CM | POA: Diagnosis not present

## 2021-09-30 DIAGNOSIS — H524 Presbyopia: Secondary | ICD-10-CM | POA: Diagnosis not present

## 2021-09-30 DIAGNOSIS — H04123 Dry eye syndrome of bilateral lacrimal glands: Secondary | ICD-10-CM | POA: Diagnosis not present

## 2021-09-30 DIAGNOSIS — H25813 Combined forms of age-related cataract, bilateral: Secondary | ICD-10-CM | POA: Diagnosis not present

## 2021-09-30 DIAGNOSIS — E119 Type 2 diabetes mellitus without complications: Secondary | ICD-10-CM | POA: Diagnosis not present

## 2021-09-30 DIAGNOSIS — H40013 Open angle with borderline findings, low risk, bilateral: Secondary | ICD-10-CM | POA: Diagnosis not present

## 2021-09-30 LAB — HM DIABETES EYE EXAM

## 2021-10-04 NOTE — Progress Notes (Signed)
?Tabitha [name] D.O. ?[name] ?[place] ?Phone: [phone] ?Subjective:   ?I, Tabitha [name], am serving as a scribe for Dr. Hulan [name]. ? ?This visit occurred during the SARS-CoV-2 public health emergency.  Safety protocols were in place, including screening questions prior to the visit, additional usage of staff PPE, and extensive cleaning of exam room while observing appropriate contact time as indicated for disinfecting solutions.  ? ?I'm seeing this patient by the request  of:  Tabitha Nutting, DO ? ?CC: Shoulder pain, knee pain and back pain follow-up ? ?[id number]  ?Tabitha [name] is a 56 y.o. female coming in with complaint of back and neck pain. OMT 08/23/2021. Also f/u for B shoulder and B knee pain. Patient states that she did get some relief from injections. Does have some pain still in L AC joint. Knee pain has improved.  ? ?States that she did something to her lower back yesterday and is having pain on L side.  ? ?Medications patient has been prescribed: Colchicine ? ?Taking: ? ? ?  ? ? ? ? ?Reviewed prior external information including notes and imaging from previsou exam, outside providers and external EMR if available.  ? ?As well as notes that were available from care everywhere and other healthcare systems. ? ?Past medical history, social, surgical and family history all reviewed in electronic medical record.  No pertanent information unless stated regarding to the chief complaint.  ? ?Past Medical History:  ?Diagnosis Date  ? Arthritis   ? Asthma   ? De Quervain's disease (tenosynovitis)   ? Dermatophytosis, scalp   ? Diabetes mellitus   ? Hypercholesteremia   ? Obesity   ? Polycystic ovarian disease   ? Psoriatic arthritis ([place])   ? Wears glasses   ?  ?Allergies  ?Allergen Reactions  ? Chocolate Anaphylaxis  ? Food   ?  chocolate  ? Statins   ?  Intolerant  lft elevation  ? Latex Rash  ? ? ? ?Review of Systems: ? No headache, visual changes, nausea,  vomiting, diarrhea, constipation, dizziness, abdominal pain, skin rash, fevers, chills, night sweats, weight loss, swollen lymph nodes, body aches, joint swelling, chest pain, shortness of breath, mood changes. POSITIVE muscle aches ? ?Objective  ?Blood pressure 120/72, pulse 82, height '5\' 2"'$  (1.575 m), weight 215 lb (97.5 kg), SpO2 97 %. ?  ?General: No apparent distress alert and oriented x3 mood and affect normal, dressed appropriately.  ?HEENT: Pupils equal, extraocular movements intact  ?Respiratory: Patient's speak in full sentences and does not appear short of breath  ?Cardiovascular: No lower extremity edema, non tender, no erythema  ?Patient still has knee crepitus noted at the patellofemoral joint bilaterally but no significant effusion noted.  Patient ?Back seems to be loosening usual.  Still has tenderness in the thoracolumbar and lumbosacral area. ?Left shoulder exam still has positive crossover noted.  Otherwise 5-5 strength of the shoulders bilaterally ? ?Osteopathic findings ? ?C2 flexed rotated and side bent right ?C6 flexed rotated and side bent left ?T3 extended rotated and side bent left inhaled rib ?T7 extended rotated and side bent left ?L2 flexed rotated and side bent right ?Sacrum right on right ? ? ? ? ?  ?Assessment and Plan: ? ?Low back pain ?Chronic, overall, patient's weight is now stable.  Patient will continue to be active where she can.  The patient is working on this.  Discussed with patient also seems to be doing better with the  new medication for her psoriatic arthritis.  Patient will follow-up with me again in 6 to 8 weeks ? ?Arthritis of both acromioclavicular joints ?Improvement noted from previous injections ? ?Degenerative arthritis of knee, bilateral ?Improvement noted with previous injections.  ? ?Nonallopathic problems ? ?Decision today to treat with OMT was based on Physical Exam ? ?After verbal consent patient was treated with HVLA, ME, FPR techniques in cervical, rib,  thoracic, lumbar, and sacral  areas ? ?Patient tolerated the procedure well with improvement in symptoms ? ?Patient given exercises, stretches and lifestyle modifications ? ?See medications in patient instructions if given ? ?Patient will follow up in 4-8 weeks ? ?  ? ? ?The above documentation has been reviewed and is accurate and complete Tabitha Pulley, DO ? ? ?  ? ? Note: This dictation was prepared with Dragon dictation along with smaller phrase technology. Any transcriptional errors that result from this process are unintentional.    ?  ?  ? ?

## 2021-10-05 ENCOUNTER — Ambulatory Visit: Payer: 59 | Admitting: Family Medicine

## 2021-10-05 VITALS — BP 120/72 | HR 82 | Ht 62.0 in | Wt 215.0 lb

## 2021-10-05 DIAGNOSIS — M9901 Segmental and somatic dysfunction of cervical region: Secondary | ICD-10-CM

## 2021-10-05 DIAGNOSIS — M9903 Segmental and somatic dysfunction of lumbar region: Secondary | ICD-10-CM | POA: Diagnosis not present

## 2021-10-05 DIAGNOSIS — G8929 Other chronic pain: Secondary | ICD-10-CM | POA: Diagnosis not present

## 2021-10-05 DIAGNOSIS — M9902 Segmental and somatic dysfunction of thoracic region: Secondary | ICD-10-CM

## 2021-10-05 DIAGNOSIS — M170 Bilateral primary osteoarthritis of knee: Secondary | ICD-10-CM

## 2021-10-05 DIAGNOSIS — M9908 Segmental and somatic dysfunction of rib cage: Secondary | ICD-10-CM | POA: Diagnosis not present

## 2021-10-05 DIAGNOSIS — M9904 Segmental and somatic dysfunction of sacral region: Secondary | ICD-10-CM

## 2021-10-05 DIAGNOSIS — M19011 Primary osteoarthritis, right shoulder: Secondary | ICD-10-CM | POA: Diagnosis not present

## 2021-10-05 DIAGNOSIS — M19012 Primary osteoarthritis, left shoulder: Secondary | ICD-10-CM

## 2021-10-05 DIAGNOSIS — M545 Low back pain, unspecified: Secondary | ICD-10-CM

## 2021-10-05 NOTE — Assessment & Plan Note (Signed)
Improvement noted with previous injections. ?

## 2021-10-05 NOTE — Assessment & Plan Note (Signed)
Improvement noted from previous injections ?

## 2021-10-05 NOTE — Patient Instructions (Addendum)
Good to see you  ?Enjoy the doggy ?Keep doing everything else ?Follow up in 6 weeks  ?

## 2021-10-05 NOTE — Assessment & Plan Note (Signed)
Chronic, overall, patient's weight is now stable.  Patient will continue to be active where she can.  The patient is working on this.  Discussed with patient also seems to be doing better with the new medication for her psoriatic arthritis.  Patient will follow-up with me again in 6 to 8 weeks ?

## 2021-10-10 ENCOUNTER — Other Ambulatory Visit (HOSPITAL_COMMUNITY): Payer: Self-pay

## 2021-10-10 DIAGNOSIS — F4323 Adjustment disorder with mixed anxiety and depressed mood: Secondary | ICD-10-CM | POA: Diagnosis not present

## 2021-10-11 ENCOUNTER — Other Ambulatory Visit (HOSPITAL_BASED_OUTPATIENT_CLINIC_OR_DEPARTMENT_OTHER): Payer: Self-pay

## 2021-10-11 MED ORDER — VYVANSE 60 MG PO CAPS
ORAL_CAPSULE | ORAL | 0 refills | Status: DC
  Filled 2021-10-11: qty 30, 30d supply, fill #0

## 2021-10-13 ENCOUNTER — Other Ambulatory Visit (HOSPITAL_COMMUNITY): Payer: Self-pay

## 2021-10-17 ENCOUNTER — Other Ambulatory Visit (HOSPITAL_COMMUNITY): Payer: Self-pay

## 2021-10-17 DIAGNOSIS — F4323 Adjustment disorder with mixed anxiety and depressed mood: Secondary | ICD-10-CM | POA: Diagnosis not present

## 2021-10-24 DIAGNOSIS — F4323 Adjustment disorder with mixed anxiety and depressed mood: Secondary | ICD-10-CM | POA: Diagnosis not present

## 2021-11-02 DIAGNOSIS — F4323 Adjustment disorder with mixed anxiety and depressed mood: Secondary | ICD-10-CM | POA: Diagnosis not present

## 2021-11-09 ENCOUNTER — Other Ambulatory Visit (HOSPITAL_COMMUNITY): Payer: Self-pay

## 2021-11-10 ENCOUNTER — Other Ambulatory Visit (HOSPITAL_COMMUNITY): Payer: Self-pay

## 2021-11-10 ENCOUNTER — Ambulatory Visit: Payer: 59 | Admitting: Family Medicine

## 2021-11-14 ENCOUNTER — Other Ambulatory Visit (HOSPITAL_COMMUNITY): Payer: Self-pay

## 2021-11-14 NOTE — Progress Notes (Unsigned)
[name] Phone: [phone] Subjective:    I'm seeing this patient by the request  of:  Luetta Nutting, DO  CC: Neck and back pain knee pain  [id number]  Tabitha [name] is a 56 y.o. female coming in with complaint of back and neck pain. OMT on 10/05/2021. Patient states that she has been doing well.   L AC joint has been bothering her more especially with flexion. Last injection to [facility] joint was 09/02/2021.   Medications patient has been prescribed: None  Taking:         Reviewed prior external information including notes and imaging from previsou exam, outside providers and external EMR if available.   As well as notes that were available from care everywhere and other healthcare systems.  Past medical history, social, surgical and family history all reviewed in electronic medical record.  No pertanent information unless stated regarding to the chief complaint.   Past Medical History:  Diagnosis Date   Arthritis    Asthma    De Quervain's disease (tenosynovitis)    Dermatophytosis, scalp    Diabetes mellitus    Hypercholesteremia    Obesity    Polycystic ovarian disease    Psoriatic arthritis ([facility])    Wears glasses     Allergies  Allergen Reactions   Chocolate Anaphylaxis   Food     chocolate   Statins     Intolerant  lft elevation   Latex Rash     Review of Systems:  No headache, visual changes, nausea, vomiting, diarrhea, constipation, dizziness, abdominal pain, skin rash, fevers, chills, night sweats, weight loss, swollen lymph nodes, body aches, joint swelling, chest pain, shortness of breath, mood changes. POSITIVE muscle aches  Objective  Blood pressure 132/70, pulse 95, height '5\' 2"'$  (1.575 m), weight 218 lb (98.9 kg), SpO2 96 %.   General: No apparent distress alert and oriented x3 mood and affect normal, dressed appropriately.  HEENT: Pupils equal, extraocular movements  intact  Respiratory: Patient's speak in full sentences and does not appear short of breath  Cardiovascular: No lower extremity edema, non tender, no erythema  Patient does have some mild swelling noted over the acromioclavicular joint.  Tender to palpation on the left side only.  Positive crossover noted.  Knee exams do have a trace effusion noted but nothing severe.  Osteopathic findings  C2 flexed rotated and side bent right C6 flexed rotated and side bent left T3 extended rotated and side bent right inhaled rib T9 extended rotated and side bent left L1 flexed rotated and side bent left Sacrum right on right   Procedure: Real-time Ultrasound Guided Injection of left acromioclavicular joint Device: GE Logiq Q7 Ultrasound guided injection is preferred based studies that show increased duration, increased effect, greater accuracy, decreased procedural pain, increased response rate, and decreased cost with ultrasound guided versus blind injection.  Verbal informed consent obtained.  Time-out conducted.  Noted no overlying erythema, induration, or other signs of local infection.  Skin prepped in a sterile fashion.  Local anesthesia: Topical Ethyl chloride.  With sterile technique and under real time ultrasound guidance: With a 25-gauge half inch needle injecting 0.5 cc of 0.5% Marcaine and 0.5 cc of Kenalog 40 mg/mL Completed without difficulty  Pain immediately resolved suggesting accurate placement of the medication.  Advised to call if fevers/chills, erythema, induration, drainage, or persistent bleeding.  Impression: Technically successful ultrasound guided injection.    Assessment and  Plan:  Low back pain  Chronic problem noted.  Discussed icing regimen. Exacerbation-More Discomfort Today.  Discussed Icing Regimen and Home Exercises, Core Stability. Believe Patient Did Have Unfortunately a Flare Secondary to the Loss of Her Dog Recently and Stress.  Follow-Up with Me Again in 6  to 8 Weeks    Nonallopathic problems  Decision today to treat with OMT was based on Physical Exam  After verbal consent patient was treated with HVLA, ME, FPR techniques in cervical, rib, thoracic, lumbar, and sacral  areas  Patient tolerated the procedure well with improvement in symptoms  Patient given exercises, stretches and lifestyle modifications  See medications in patient instructions if given  Patient will follow up in 4-8 weeks      The above documentation has been reviewed and is accurate and complete Lyndal Pulley, DO        Note: This dictation was prepared with Dragon dictation along with smaller phrase technology. Any transcriptional errors that result from this process are unintentional.

## 2021-11-15 ENCOUNTER — Other Ambulatory Visit (HOSPITAL_COMMUNITY): Payer: Self-pay

## 2021-11-15 ENCOUNTER — Other Ambulatory Visit (HOSPITAL_BASED_OUTPATIENT_CLINIC_OR_DEPARTMENT_OTHER): Payer: Self-pay

## 2021-11-16 ENCOUNTER — Ambulatory Visit: Payer: Self-pay

## 2021-11-16 ENCOUNTER — Ambulatory Visit (INDEPENDENT_AMBULATORY_CARE_PROVIDER_SITE_OTHER): Payer: 59 | Admitting: Family Medicine

## 2021-11-16 VITALS — BP 132/70 | HR 95 | Ht 62.0 in | Wt 218.0 lb

## 2021-11-16 DIAGNOSIS — M9908 Segmental and somatic dysfunction of rib cage: Secondary | ICD-10-CM | POA: Diagnosis not present

## 2021-11-16 DIAGNOSIS — M9901 Segmental and somatic dysfunction of cervical region: Secondary | ICD-10-CM

## 2021-11-16 DIAGNOSIS — M25512 Pain in left shoulder: Secondary | ICD-10-CM

## 2021-11-16 DIAGNOSIS — M9902 Segmental and somatic dysfunction of thoracic region: Secondary | ICD-10-CM

## 2021-11-16 DIAGNOSIS — M545 Low back pain, unspecified: Secondary | ICD-10-CM

## 2021-11-16 DIAGNOSIS — M9904 Segmental and somatic dysfunction of sacral region: Secondary | ICD-10-CM

## 2021-11-16 DIAGNOSIS — G8929 Other chronic pain: Secondary | ICD-10-CM

## 2021-11-16 DIAGNOSIS — M19011 Primary osteoarthritis, right shoulder: Secondary | ICD-10-CM | POA: Diagnosis not present

## 2021-11-16 DIAGNOSIS — M9903 Segmental and somatic dysfunction of lumbar region: Secondary | ICD-10-CM

## 2021-11-16 DIAGNOSIS — M19012 Primary osteoarthritis, left shoulder: Secondary | ICD-10-CM

## 2021-11-16 NOTE — Assessment & Plan Note (Signed)
  Chronic problem noted.  Discussed icing regimen. Exacerbation-More Discomfort Today.  Discussed Icing Regimen and Home Exercises, Core Stability. Believe Patient Did Have Unfortunately a Flare Secondary to the Loss of Her Dog Recently and Stress.  Follow-Up with Me Again in 6 to 8 Weeks

## 2021-11-16 NOTE — Assessment & Plan Note (Signed)
Patient given injection in the left side today. Chronic problem with worsening symptoms.  If he continues to get more difficulty may need advanced imaging.  Seems to be more secondary to the flare of the underlying autoimmune disease.

## 2021-11-16 NOTE — Patient Instructions (Signed)
Injected AC joint today Sorry for your loss See me in 4-6 weeks

## 2021-11-17 ENCOUNTER — Ambulatory Visit: Payer: 59 | Attending: Family Medicine | Admitting: Pharmacist

## 2021-11-17 ENCOUNTER — Other Ambulatory Visit (HOSPITAL_COMMUNITY): Payer: Self-pay

## 2021-11-17 ENCOUNTER — Telehealth: Payer: Self-pay | Admitting: Pharmacist

## 2021-11-17 DIAGNOSIS — Z79899 Other long term (current) drug therapy: Secondary | ICD-10-CM

## 2021-11-17 DIAGNOSIS — L409 Psoriasis, unspecified: Secondary | ICD-10-CM | POA: Diagnosis not present

## 2021-11-17 DIAGNOSIS — M7062 Trochanteric bursitis, left hip: Secondary | ICD-10-CM | POA: Diagnosis not present

## 2021-11-17 DIAGNOSIS — M159 Polyosteoarthritis, unspecified: Secondary | ICD-10-CM | POA: Diagnosis not present

## 2021-11-17 DIAGNOSIS — L4050 Arthropathic psoriasis, unspecified: Secondary | ICD-10-CM | POA: Diagnosis not present

## 2021-11-17 DIAGNOSIS — M7061 Trochanteric bursitis, right hip: Secondary | ICD-10-CM | POA: Diagnosis not present

## 2021-11-17 DIAGNOSIS — G4733 Obstructive sleep apnea (adult) (pediatric): Secondary | ICD-10-CM | POA: Diagnosis not present

## 2021-11-17 MED ORDER — TALTZ 80 MG/ML ~~LOC~~ SOAJ
SUBCUTANEOUS | 5 refills | Status: DC
  Filled 2021-11-17: qty 1, fill #0
  Filled 2021-12-07: qty 1, 28d supply, fill #0
  Filled 2022-01-06: qty 1, 28d supply, fill #1
  Filled 2022-02-27: qty 1, 28d supply, fill #2
  Filled 2022-03-23: qty 1, 28d supply, fill #3

## 2021-11-17 MED ORDER — TALTZ 80 MG/ML ~~LOC~~ SOAJ: SUBCUTANEOUS | 5 refills | Status: DC

## 2021-11-17 NOTE — Telephone Encounter (Signed)
Called patient to schedule an appointment for the [employer] [facility]. I was unable to reach the patient so I left a HIPAA-compliant message requesting that the patient return my call.   Luke Van Ausdall, PharmD, BCACP, CPP Clinical Pharmacist [facility] [phone]  

## 2021-11-17 NOTE — Progress Notes (Signed)
   S: Patient presents today for review of their specialty medication.   Patient is taking Taltz for psoriatic arthritis. Patient is managed by Dr. Posey Pronto  for this.   Dosing: Psoriatic arthritis: SubQ: 160 mg once, followed by 80 mg every 4 weeks; may administer alone or in combination with conventional disease-modifying antirheumatic drugs (eg, methotrexate). Note: For psoriatic arthritis patients with coexisting moderate to severe plaque psoriasis, use the dosing regimen for plaque psoriasis.  Adherence: confirmed  Efficacy: states that it works okay for her. Has discussed with Dr. Posey Pronto to increase to q2weeks if control worsens.   Monitoring:  S/sx infection: none Injection site reactions: some erythema/bruising that resolves a couple of days post injection. S/sx of IBD: none S/sx of hypersensitivity reaction: none  Current adverse effects: none    O:     Lab Results  Component Value Date   WBC 6.5 08/21/2019   HGB 14.2 08/21/2019   HCT 44.0 08/21/2019   MCV 90.5 08/21/2019   PLT 214 08/21/2019      Chemistry      Component Value Date/Time   NA 139 08/21/2019 0834   K 4.5 08/21/2019 0834   CL 104 08/21/2019 0834   CO2 26 08/21/2019 0834   BUN 16 08/21/2019 0834   CREATININE 0.69 08/21/2019 0834      Component Value Date/Time   CALCIUM 9.8 08/21/2019 0834   ALKPHOS 71 04/05/2017 0817   AST 16 08/21/2019 0834   ALT 22 08/21/2019 0834   BILITOT 0.3 08/21/2019 0834       A/P: 1. Medication review: patient is taking Taltz for psoriatic arthritis. Reviewed the medication with the patient, including the following: Donnetta Hail is a medication used to treat ankylosing spondylitis, plaque psoriasis, and psoriatic arthritis. Subcutaneous: Allow to reach room temperature prior to injection (30 minutes). Do not shake. Inject full amount into the upper arms, thighs or any quadrant of the abdomen; administer each injection at a different anatomic location than a previous  injection and avoid areas where the skin is tender, bruised, erythematous, indurated, or affected by psoriasis. Administration in the upper, outer arm may be performed by a caregiver or health care provider. Ixekizumab is intended for use under the guidance and supervision of a physician; may be self-injected by the patient following proper training in SubQ injection technique. Possible adverse effects include neutropenia, antibody development, increased risk of infection, injection site reaction, hypersensitivity reactions, and inflammatory bowel disease. Avoid live vaccinations. No recommendations for any changes at this time.  Benard Halsted, PharmD, [place], [place] [phone]

## 2021-11-18 ENCOUNTER — Other Ambulatory Visit (HOSPITAL_COMMUNITY): Payer: Self-pay

## 2021-11-18 ENCOUNTER — Other Ambulatory Visit (HOSPITAL_BASED_OUTPATIENT_CLINIC_OR_DEPARTMENT_OTHER): Payer: Self-pay

## 2021-11-18 MED ORDER — CLOBETASOL PROPIONATE 0.05 % EX SOLN
Freq: Two times a day (BID) | CUTANEOUS | 5 refills | Status: AC
Start: 2021-11-17 — End: ?
  Filled 2021-11-18: qty 50, 30d supply, fill #0

## 2021-11-18 MED ORDER — VYVANSE 60 MG PO CAPS
ORAL_CAPSULE | ORAL | 0 refills | Status: DC
  Filled 2021-11-18: qty 30, 30d supply, fill #0

## 2021-11-24 DIAGNOSIS — F902 Attention-deficit hyperactivity disorder, combined type: Secondary | ICD-10-CM | POA: Diagnosis not present

## 2021-11-24 DIAGNOSIS — Z79899 Other long term (current) drug therapy: Secondary | ICD-10-CM | POA: Diagnosis not present

## 2021-12-07 ENCOUNTER — Other Ambulatory Visit (HOSPITAL_COMMUNITY): Payer: Self-pay

## 2021-12-08 ENCOUNTER — Other Ambulatory Visit (HOSPITAL_BASED_OUTPATIENT_CLINIC_OR_DEPARTMENT_OTHER): Payer: Self-pay

## 2021-12-12 DIAGNOSIS — M545 Low back pain, unspecified: Secondary | ICD-10-CM | POA: Diagnosis not present

## 2021-12-14 ENCOUNTER — Other Ambulatory Visit (HOSPITAL_BASED_OUTPATIENT_CLINIC_OR_DEPARTMENT_OTHER): Payer: Self-pay

## 2021-12-14 MED ORDER — VYVANSE 60 MG PO CAPS
ORAL_CAPSULE | ORAL | 0 refills | Status: DC
Start: 2021-12-14 — End: 2022-01-20
  Filled 2021-12-14 – 2021-12-16 (×2): qty 30, 30d supply, fill #0

## 2021-12-16 ENCOUNTER — Other Ambulatory Visit (HOSPITAL_BASED_OUTPATIENT_CLINIC_OR_DEPARTMENT_OTHER): Payer: Self-pay

## 2021-12-21 ENCOUNTER — Ambulatory Visit: Payer: 59 | Admitting: Family Medicine

## 2021-12-23 ENCOUNTER — Encounter: Payer: Self-pay | Admitting: Family Medicine

## 2021-12-23 ENCOUNTER — Ambulatory Visit: Payer: 59 | Admitting: Family Medicine

## 2021-12-26 ENCOUNTER — Other Ambulatory Visit: Payer: Self-pay

## 2021-12-26 DIAGNOSIS — G8929 Other chronic pain: Secondary | ICD-10-CM

## 2021-12-26 NOTE — Progress Notes (Unsigned)
[name] [address] [place] Phone: [phone] Subjective:   Tabitha [name], am serving as a scribe for Dr. Hulan Saas.  I'm seeing this patient by the request  of:  Luetta Nutting, DO  CC: back and neck pain and shoulder and knee pain   [id number]  Tabitha [name] is a 56 y.o. female coming in with complaint of back and neck pain. OMT 11/16/2021. Patient is going to get MRI of L shoulder. Patient states back is hurting a lot today. Wants to talk about shoulder. No other complaints.  Medications patient has been prescribed: None  Taking:         Reviewed prior external information including notes and imaging from previsou exam, outside providers and external EMR if available.   As well as notes that were available from care everywhere and other healthcare systems.  Past medical history, social, surgical and family history all reviewed in electronic medical record.  No pertanent information unless stated regarding to the chief complaint.   Past Medical History:  Diagnosis Date   Arthritis    Asthma    De Quervain's disease (tenosynovitis)    Dermatophytosis, scalp    Diabetes mellitus    Hypercholesteremia    Obesity    Polycystic ovarian disease    Psoriatic arthritis ([facility])    Wears glasses     Allergies  Allergen Reactions   Chocolate Anaphylaxis   Food     chocolate   Statins     Intolerant  lft elevation   Latex Rash     Review of Systems:  No headache, visual changes, nausea, vomiting, diarrhea, constipation, dizziness, abdominal pain, skin rash, fevers, chills, night sweats, weight loss, swollen lymph nodes, body aches, joint swelling, chest pain, shortness of breath, mood changes. POSITIVE muscle aches  Objective  Blood pressure 120/72, pulse 81, height '5\' 2"'$  (1.575 m), SpO2 98 %.   General: No apparent distress alert and oriented x3 mood and affect normal, dressed appropriately.  HEENT: Pupils  equal, extraocular movements intact  Respiratory: Patient's speak in full sentences and does not appear short of breath  Cardiovascular: No lower extremity edema, non tender, no erythema  Gait mild antalgia gait Left shoulder exam does have positive O'Brien's noted.  Still has some limited range of motion in all planes.  Patient does have a positive crossover.  Tenderness to palpation over the sacroiliac joint noted. MSK:  Back low back exam as stated before does have tenderness to palpation over the sacroiliac joint.  Patient does have some tightness in the thoracolumbar juncture as well. Knee exam shows a trace effusion of the knees bilaterally of the patellofemoral joint. Osteopathic findings  C2 flexed rotated and side bent right C5 flexed rotated and side bent left T3 extended rotated and side bent right inhaled rib T9 extended rotated and side bent left L3 flexed rotated and side bent right Sacrum right on right       Assessment and Plan:  Low back pain Chronic problem with some mild discomfort over the course of time here.  Does have the autoimmune underlying.  We will continue to monitor.  Follow-up again in 6 to 8 weeks otherwise.  Arthritis of both acromioclavicular joints Patient is getting MRI.  Continues to have positive crossover.  With the autoimmune with patient having greater than 10 months of conservative therapy with no significant improvement concerned that patient is having more of the erosive changes that could  be contributing.  Will also do an MR arthrogram to further evaluate for the labral pathology.    Nonallopathic problems  Decision today to treat with OMT was based on Physical Exam  After verbal consent patient was treated with HVLA, ME, FPR techniques in cervical, rib, thoracic, lumbar, and sacral  areas  Patient tolerated the procedure well with improvement in symptoms  Patient given exercises, stretches and lifestyle modifications  See  medications in patient instructions if given  Patient will follow up in 4-8 weeks             Note: This dictation was prepared with Dragon dictation along with smaller phrase technology. Any transcriptional errors that result from this process are unintentional.

## 2021-12-27 ENCOUNTER — Encounter: Payer: Self-pay | Admitting: Family Medicine

## 2021-12-27 ENCOUNTER — Ambulatory Visit: Payer: 59 | Admitting: Family Medicine

## 2021-12-27 VITALS — BP 120/72 | HR 81 | Ht 62.0 in

## 2021-12-27 DIAGNOSIS — M9908 Segmental and somatic dysfunction of rib cage: Secondary | ICD-10-CM | POA: Diagnosis not present

## 2021-12-27 DIAGNOSIS — M19012 Primary osteoarthritis, left shoulder: Secondary | ICD-10-CM

## 2021-12-27 DIAGNOSIS — L4050 Arthropathic psoriasis, unspecified: Secondary | ICD-10-CM | POA: Diagnosis not present

## 2021-12-27 DIAGNOSIS — M9904 Segmental and somatic dysfunction of sacral region: Secondary | ICD-10-CM

## 2021-12-27 DIAGNOSIS — M9903 Segmental and somatic dysfunction of lumbar region: Secondary | ICD-10-CM | POA: Diagnosis not present

## 2021-12-27 DIAGNOSIS — M545 Low back pain, unspecified: Secondary | ICD-10-CM | POA: Diagnosis not present

## 2021-12-27 DIAGNOSIS — M9901 Segmental and somatic dysfunction of cervical region: Secondary | ICD-10-CM | POA: Diagnosis not present

## 2021-12-27 DIAGNOSIS — G8929 Other chronic pain: Secondary | ICD-10-CM | POA: Diagnosis not present

## 2021-12-27 DIAGNOSIS — M9902 Segmental and somatic dysfunction of thoracic region: Secondary | ICD-10-CM

## 2021-12-27 DIAGNOSIS — M19011 Primary osteoarthritis, right shoulder: Secondary | ICD-10-CM

## 2021-12-27 NOTE — Patient Instructions (Signed)
Good to see you! Congratulations again We will see what the MRI shows See you again in 6-8 weeks

## 2021-12-27 NOTE — Assessment & Plan Note (Signed)
Patient is getting MRI.  Continues to have positive crossover.  With the autoimmune with patient having greater than 10 months of conservative therapy with no significant improvement concerned that patient is having more of the erosive changes that could be contributing.  Will also do an MR arthrogram to further evaluate for the labral pathology.

## 2021-12-27 NOTE — Assessment & Plan Note (Signed)
Chronic problem with some mild discomfort over the course of time here.  Does have the autoimmune underlying.  We will continue to monitor.  Follow-up again in 6 to 8 weeks otherwise.

## 2021-12-29 ENCOUNTER — Encounter: Payer: Self-pay | Admitting: Family Medicine

## 2021-12-29 DIAGNOSIS — E0865 Diabetes mellitus due to underlying condition with hyperglycemia: Secondary | ICD-10-CM

## 2021-12-29 DIAGNOSIS — E7800 Pure hypercholesterolemia, unspecified: Secondary | ICD-10-CM

## 2021-12-29 NOTE — Telephone Encounter (Signed)
Lab orders signed.   CM

## 2022-01-03 ENCOUNTER — Ambulatory Visit: Payer: 59 | Admitting: Family Medicine

## 2022-01-04 ENCOUNTER — Other Ambulatory Visit: Payer: Self-pay | Admitting: Family Medicine

## 2022-01-04 ENCOUNTER — Ambulatory Visit
Admission: RE | Admit: 2022-01-04 | Discharge: 2022-01-04 | Disposition: A | Discharge status: Home / Self Care | Payer: 59 | Source: Ambulatory Visit | Attending: Family Medicine | Admitting: Family Medicine

## 2022-01-04 ENCOUNTER — Other Ambulatory Visit (HOSPITAL_COMMUNITY): Payer: Self-pay

## 2022-01-04 DIAGNOSIS — S46012A Strain of muscle(s) and tendon(s) of the rotator cuff of left shoulder, initial encounter: Secondary | ICD-10-CM | POA: Diagnosis not present

## 2022-01-04 DIAGNOSIS — G8929 Other chronic pain: Secondary | ICD-10-CM

## 2022-01-04 DIAGNOSIS — M25512 Pain in left shoulder: Secondary | ICD-10-CM | POA: Diagnosis not present

## 2022-01-04 MED ORDER — IOPAMIDOL (ISOVUE-M 200) INJECTION 41%
13.0000 mL | Freq: Once | INTRAMUSCULAR | Status: AC
  Administered 2022-01-04: 13 mL via INTRA_ARTICULAR

## 2022-01-05 ENCOUNTER — Other Ambulatory Visit (HOSPITAL_BASED_OUTPATIENT_CLINIC_OR_DEPARTMENT_OTHER): Payer: Self-pay

## 2022-01-05 ENCOUNTER — Ambulatory Visit: Payer: 59 | Admitting: Family Medicine

## 2022-01-05 ENCOUNTER — Other Ambulatory Visit: Payer: Self-pay | Admitting: Family Medicine

## 2022-01-05 ENCOUNTER — Encounter: Payer: Self-pay | Admitting: Family Medicine

## 2022-01-05 VITALS — BP 126/72 | HR 76 | Ht 62.0 in | Wt 218.6 lb

## 2022-01-05 DIAGNOSIS — R799 Abnormal finding of blood chemistry, unspecified: Secondary | ICD-10-CM | POA: Insufficient documentation

## 2022-01-05 DIAGNOSIS — F988 Other specified behavioral and emotional disorders with onset usually occurring in childhood and adolescence: Secondary | ICD-10-CM | POA: Diagnosis not present

## 2022-01-05 DIAGNOSIS — R002 Palpitations: Secondary | ICD-10-CM | POA: Diagnosis not present

## 2022-01-05 DIAGNOSIS — E0865 Diabetes mellitus due to underlying condition with hyperglycemia: Secondary | ICD-10-CM

## 2022-01-05 DIAGNOSIS — E1169 Type 2 diabetes mellitus with other specified complication: Secondary | ICD-10-CM

## 2022-01-05 DIAGNOSIS — R000 Tachycardia, unspecified: Secondary | ICD-10-CM | POA: Diagnosis not present

## 2022-01-05 DIAGNOSIS — Z23 Encounter for immunization: Secondary | ICD-10-CM | POA: Diagnosis not present

## 2022-01-05 DIAGNOSIS — E785 Hyperlipidemia, unspecified: Secondary | ICD-10-CM

## 2022-01-05 DIAGNOSIS — M75122 Complete rotator cuff tear or rupture of left shoulder, not specified as traumatic: Secondary | ICD-10-CM

## 2022-01-05 LAB — COMPLETE METABOLIC PANEL WITH GFR
AG Ratio: 1.6 (calc) (ref 1.0–2.5)
ALT: 22 U/L (ref 6–29)
AST: 13 U/L (ref 10–35)
Albumin: 4.2 g/dL (ref 3.6–5.1)
Alkaline phosphatase (APISO): 84 U/L (ref 37–153)
BUN/Creatinine Ratio: 42 (calc) — ABNORMAL HIGH (ref 6–22)
BUN: 27 mg/dL — ABNORMAL HIGH (ref 7–25)
CO2: 24 mmol/L (ref 20–32)
Calcium: 9.5 mg/dL (ref 8.6–10.4)
Chloride: 104 mmol/L (ref 98–110)
Creat: 0.64 mg/dL (ref 0.50–1.03)
Globulin: 2.7 g/dL (calc) (ref 1.9–3.7)
Glucose, Bld: 136 mg/dL — ABNORMAL HIGH (ref 65–99)
Potassium: 4.5 mmol/L (ref 3.5–5.3)
Sodium: 139 mmol/L (ref 135–146)
Total Bilirubin: 0.5 mg/dL (ref 0.2–1.2)
Total Protein: 6.9 g/dL (ref 6.1–8.1)
eGFR: 103 mL/min/{1.73_m2} (ref >=60)

## 2022-01-05 LAB — CBC WITH DIFFERENTIAL/PLATELET
Absolute Monocytes: 563 cells/uL (ref 200–950)
Basophils Absolute: 51 cells/uL (ref 0–200)
Basophils Relative: 0.8 %
Eosinophils Absolute: 250 cells/uL (ref 15–500)
Eosinophils Relative: 3.9 %
HCT: 46.8 % — ABNORMAL HIGH (ref 35.0–45.0)
Hemoglobin: 15.5 g/dL (ref 11.7–15.5)
Lymphs Abs: 1459 cells/uL (ref 850–3900)
MCH: 30.6 pg (ref 27.0–33.0)
MCHC: 33.1 g/dL (ref 32.0–36.0)
MCV: 92.3 fL (ref 80.0–100.0)
MPV: 10.9 fL (ref 7.5–12.5)
Monocytes Relative: 8.8 %
Neutro Abs: 4077 cells/uL (ref 1500–7800)
Neutrophils Relative %: 63.7 %
Platelets: 181 10*3/uL (ref 140–400)
RBC: 5.07 10*6/uL (ref 3.80–5.10)
RDW: 11.9 % (ref 11.0–15.0)
Total Lymphocyte: 22.8 %
WBC: 6.4 10*3/uL (ref 3.8–10.8)

## 2022-01-05 LAB — LIPID PANEL W/REFLEX DIRECT LDL
Cholesterol: 214 mg/dL — ABNORMAL HIGH (ref <200)
HDL: 64 mg/dL (ref >=50)
LDL Cholesterol (Calc): 127 mg/dL (calc) — ABNORMAL HIGH
Non-HDL Cholesterol (Calc): 150 mg/dL (calc) — ABNORMAL HIGH (ref <130)
Total CHOL/HDL Ratio: 3.3 (calc) (ref <5.0)
Triglycerides: 123 mg/dL (ref <150)

## 2022-01-05 LAB — HEMOGLOBIN A1C
Hgb A1c MFr Bld: 6.1 % of total Hgb — ABNORMAL HIGH (ref <5.7)
Mean Plasma Glucose: 128 mg/dL
eAG (mmol/L): 7.1 mmol/L

## 2022-01-05 MED ORDER — PRAVASTATIN SODIUM 20 MG PO TABS
20.0000 mg | ORAL_TABLET | Freq: Every day | ORAL | 1 refills | Status: DC
  Filled 2022-01-05: qty 90, 90d supply, fill #0
  Filled 2022-05-13: qty 90, 90d supply, fill #1

## 2022-01-05 MED ORDER — FLUCONAZOLE 150 MG PO TABS
150.0000 mg | ORAL_TABLET | Freq: Once | ORAL | 0 refills | Status: AC
  Filled 2022-01-05: qty 2, 2d supply, fill #0

## 2022-01-05 NOTE — Progress Notes (Signed)
Tabitha [name] - 56 y.o. female MRN [id number]  Date of birth: [birth year]  Subjective Chief Complaint  Patient presents with   Diabetes    HPI Tabitha [name] is a 56 y.o.Marland Kitchen female here today for follow up visit.   Diabetes is currently treated with Synjardy XR 10/'1000mg'$ . She is tolerating this well without significant side effects.   A1c is up slightly at 6.1%.    Cholesterol is elevated as well.  She is not currently on a statin due to statin intolerance.  ASCVD risk is 3.9%.    She remains on stimulants for ADHD.  This is managed by [employer].  No side effects from these.    She has had some intermittent episodes of tachycardia.  Had EKG and 3 day holter monitor previously that was normal.  She would like to see a cardiologist  ROS:  A comprehensive ROS was completed and negative except as noted per HPI   Allergies  Allergen Reactions   Chocolate Anaphylaxis   Food     chocolate   Statins     Intolerant  lft elevation   Latex Rash    Past Medical History:  Diagnosis Date   Arthritis    Asthma    De Quervain's disease (tenosynovitis)    Dermatophytosis, scalp    Diabetes mellitus    Hypercholesteremia    Obesity    Polycystic ovarian disease    Psoriatic arthritis ([place])    Wears glasses     Past Surgical History:  Procedure Laterality Date   BREAST BIOPSY Right    CARPAL TUNNEL RELEASE     COLONOSCOPY     DE QUERVAIN'S RELEASE  2012   left   KNEE ARTHROPLASTY     KNEE ARTHROSCOPY Right 05/06/2013   Procedure: RIGHT KNEE ARTHROSCOPY ;  Surgeon: Hessie Dibble, MD;  Location: [place];  Service: Orthopedics;  Laterality: Right;  partial lateral minisectomy and chondroplasty   WISDOM TOOTH EXTRACTION     WRIST SURGERY  2001   carpel tunnel -rt    Social History   Socioeconomic History   Marital status: Significant Other    Spouse name: Not on file   Number of children: Not on file   Years of education: Not on file    Highest education level: Not on file  Occupational History   Occupation: nurse    Employer: [employer]: at [employer]  Tobacco Use   Smoking status: Former    Packs/day: 1.30    Years: 12.00    Total pack years: 15.60    Types: Cigarettes    Start date: 06/12/1984    Quit date: 03/18/1995    Years since quitting: 26.8   Smokeless tobacco: Never   Tobacco comments:    Not interested in returning to smoking.   Vaping Use   Vaping Use: Never used  Substance and Sexual Activity   Alcohol use: Yes    Comment: rarely   Drug use: No   Sexual activity: Yes    Partners: Male    Comment: married  Other Topics Concern   Not on file  Social History Narrative   Not on file   Social Determinants of Health   Financial Resource Strain: Not on file  Food Insecurity: Not on file  Transportation Needs: Not on file  Physical Activity: Not on file  Stress: Not on file  Social Connections: Not on file    Family  History  Problem Relation Age of Onset   Diabetes Father    Hypertension Father    Stroke Father    Kidney disease Father    Heart disease Father    Cancer Mother 71       breast ca died at age 66   Breast cancer Mother 19   Hypertension Other    Diabetes type II Other    Obesity Other    Obesity Brother    Hypertension Brother    Heart disease Maternal Grandmother    Alzheimer's disease Maternal Grandmother    Diabetes Paternal Grandmother    Heart disease Paternal Grandfather     Health Maintenance  Topic Date Due   OPHTHALMOLOGY EXAM  09/10/2021   Diabetic kidney evaluation - Urine ACR  04/07/2022 (Originally 11/26/2021)   Zoster Vaccines- Shingrix (1 of 2) 04/07/2022 (Originally 08/13/1983)   MAMMOGRAM  01/06/2023 (Originally 11/02/2021)   COLONOSCOPY (Pts 45-1yr Insurance coverage will need to be confirmed)  01/06/2023 (Originally 12/27/2021)   Hepatitis C Screening  01/06/2023 (Originally 08/13/1982)   INFLUENZA VACCINE  01/10/2022    HEMOGLOBIN A1C  07/07/2022   Diabetic kidney evaluation - GFR measurement  01/05/2023   FOOT EXAM  01/06/2023   PAP SMEAR-Modifier  08/12/2025   TETANUS/TDAP  01/06/2032   COVID-19 Vaccine  Completed   HIV Screening  Completed   HPV VACCINES  Aged Out     ----------------------------------------------------------------------------------------------------------------------------------------------------------------------------------------------------------------- Physical Exam BP 126/72 (BP Location: Left Arm, Patient Position: Sitting, Cuff Size: Large)   Pulse 76   Ht '5\' 2"'$  (1.575 m)   Wt 218 lb 9.6 oz (99.2 kg)   SpO2 96%   BMI 39.98 kg/m   Physical Exam Constitutional:      Appearance: Normal appearance.  Eyes:     General: No scleral icterus. Cardiovascular:     Rate and Rhythm: Normal rate and regular rhythm.  Pulmonary:     Effort: Pulmonary effort is normal.     Breath sounds: Normal breath sounds.  Musculoskeletal:     Cervical back: Neck supple.  Neurological:     Mental Status: She is alert.  Psychiatric:        Mood and Affect: Mood normal.        Behavior: Behavior normal.     ------------------------------------------------------------------------------------------------------------------------------------------------------------------------------------------------------------------- Assessment and Plan  Diabetes mellitus due to underlying condition with hyperglycemia (HCC) A1c is increased slightly.  Recommend continuation of synjardy and work on dietary changes.  F/u in 3 months.   ADD (attention deficit disorder) [facility].  Stable at this time.   Palpitations Referral to cardiology.    Hyperlipidemia associated with type 2 diabetes mellitus (HCC) LDL increased.  Will see if pravastatin is better tolerated.  Check LFT's in 8 weeks.   Elevated BUN Recheck renal function in 2 weeks.    Meds ordered this  encounter  Medications   pravastatin (PRAVACHOL) 20 MG tablet    Sig: Take 1 tablet (20 mg total) by mouth daily.    Dispense:  90 tablet    Refill:  1   fluconazole (DIFLUCAN) 150 MG tablet    Sig: Take 1 tablet (150 mg total) by mouth once for 1 dose. Repeat again after 72 hours    Dispense:  2 tablet    Refill:  0    Return in about 3 months (around 04/07/2022) for T2DM.    This visit occurred during the SARS-CoV-2 public health emergency.  Safety protocols were in place, including  screening questions prior to the visit, additional usage of staff PPE, and extensive cleaning of exam room while observing appropriate contact time as indicated for disinfecting solutions.

## 2022-01-05 NOTE — Assessment & Plan Note (Signed)
LDL increased.  Will see if pravastatin is better tolerated.  Check LFT's in 8 weeks.

## 2022-01-05 NOTE — Assessment & Plan Note (Signed)
Recheck renal function in 2 weeks.

## 2022-01-05 NOTE — Assessment & Plan Note (Signed)
Referral to cardiology

## 2022-01-05 NOTE — Assessment & Plan Note (Signed)
Management per [place] [facility].  Stable at this time.

## 2022-01-05 NOTE — Assessment & Plan Note (Signed)
A1c is increased slightly.  Recommend continuation of synjardy and work on dietary changes.  F/u in 3 months.

## 2022-01-05 NOTE — Patient Instructions (Signed)
Great to see you! Let's try pravastatin and recheck liver function in 8 weeks.    Return in 2 weeks to have repeat kidney function.

## 2022-01-06 ENCOUNTER — Other Ambulatory Visit (HOSPITAL_COMMUNITY): Payer: Self-pay

## 2022-01-09 ENCOUNTER — Other Ambulatory Visit (HOSPITAL_COMMUNITY): Payer: Self-pay

## 2022-01-10 ENCOUNTER — Other Ambulatory Visit (HOSPITAL_COMMUNITY): Payer: Self-pay

## 2022-01-11 ENCOUNTER — Encounter: Payer: Self-pay | Admitting: Family Medicine

## 2022-01-12 DIAGNOSIS — I10 Essential (primary) hypertension: Secondary | ICD-10-CM | POA: Diagnosis not present

## 2022-01-12 DIAGNOSIS — E782 Mixed hyperlipidemia: Secondary | ICD-10-CM | POA: Diagnosis not present

## 2022-01-12 DIAGNOSIS — G4733 Obstructive sleep apnea (adult) (pediatric): Secondary | ICD-10-CM | POA: Diagnosis not present

## 2022-01-12 DIAGNOSIS — Z1211 Encounter for screening for malignant neoplasm of colon: Secondary | ICD-10-CM | POA: Diagnosis not present

## 2022-01-12 DIAGNOSIS — E119 Type 2 diabetes mellitus without complications: Secondary | ICD-10-CM | POA: Diagnosis not present

## 2022-01-16 DIAGNOSIS — M75122 Complete rotator cuff tear or rupture of left shoulder, not specified as traumatic: Secondary | ICD-10-CM | POA: Diagnosis not present

## 2022-01-20 ENCOUNTER — Other Ambulatory Visit (HOSPITAL_BASED_OUTPATIENT_CLINIC_OR_DEPARTMENT_OTHER): Payer: Self-pay

## 2022-01-20 DIAGNOSIS — R799 Abnormal finding of blood chemistry, unspecified: Secondary | ICD-10-CM | POA: Diagnosis not present

## 2022-01-20 MED ORDER — VYVANSE 60 MG PO CAPS
60.0000 mg | ORAL_CAPSULE | Freq: Every day | ORAL | 0 refills | Status: DC
  Filled 2022-01-20: qty 30, 30d supply, fill #0

## 2022-01-21 LAB — BASIC METABOLIC PANEL
BUN: 18 mg/dL (ref 7–25)
CO2: 29 mmol/L (ref 20–32)
Calcium: 10.6 mg/dL — ABNORMAL HIGH (ref 8.6–10.4)
Chloride: 103 mmol/L (ref 98–110)
Creat: 0.68 mg/dL (ref 0.50–1.03)
Glucose, Bld: 144 mg/dL — ABNORMAL HIGH (ref 65–99)
Potassium: 4.3 mmol/L (ref 3.5–5.3)
Sodium: 140 mmol/L (ref 135–146)

## 2022-01-26 ENCOUNTER — Ambulatory Visit: Payer: 59 | Admitting: Interventional Cardiology

## 2022-01-26 ENCOUNTER — Encounter: Payer: Self-pay | Admitting: Interventional Cardiology

## 2022-01-26 VITALS — BP 120/76 | HR 79 | Ht 62.0 in | Wt 225.0 lb

## 2022-01-26 DIAGNOSIS — Z01810 Encounter for preprocedural cardiovascular examination: Secondary | ICD-10-CM | POA: Diagnosis not present

## 2022-01-26 DIAGNOSIS — I493 Ventricular premature depolarization: Secondary | ICD-10-CM

## 2022-01-26 DIAGNOSIS — R002 Palpitations: Secondary | ICD-10-CM

## 2022-01-26 DIAGNOSIS — E119 Type 2 diabetes mellitus without complications: Secondary | ICD-10-CM

## 2022-01-26 DIAGNOSIS — I491 Atrial premature depolarization: Secondary | ICD-10-CM

## 2022-01-26 NOTE — Progress Notes (Signed)
Cardiology Office Note   Date:  01/26/2022   ID:  Tabitha [name], DOB 01-Mar-1966, MRN [id number]  PCP:  Luetta Nutting, DO    No chief complaint on file.  palpitations  Wt Readings from Last 3 Encounters:  01/26/22 225 lb (102.1 kg)  01/05/22 218 lb 9.6 oz (99.2 kg)  11/16/21 218 lb (98.9 kg)       History of Present Illness: Tabitha [name] is a 56 y.o. female who is being seen today for the evaluation of palpitations at the request of Luetta Nutting, DO.   In April 2023, she felt an irregular HR that was fast.  Sx last 30-45 minutes.    2020 monitor showed: "Normal sinus rhythm, HR 50-115 bpm, Ave 76 bpm Rare PAC's One triggered notification did not correlate with arrhythmia. Rare isolated PVC's   regular exercise on the elliptical, 3-4x /week. No sx.  Denies : Chest pain. Dizziness. Leg edema. Nitroglycerin use. Orthopnea. Palpitations. Paroxysmal nocturnal dyspnea. Shortness of breath. Syncope.     Past Medical History:  Diagnosis Date   Arthritis    Asthma    De Quervain's disease (tenosynovitis)    Dermatophytosis, scalp    Diabetes mellitus    Hypercholesteremia    Obesity    Polycystic ovarian disease    Psoriatic arthritis ([place])    Wears glasses     Past Surgical History:  Procedure Laterality Date   BREAST BIOPSY Right    CARPAL TUNNEL RELEASE     COLONOSCOPY     DE QUERVAIN'S RELEASE  2012   left   KNEE ARTHROPLASTY     KNEE ARTHROSCOPY Right 05/06/2013   Procedure: RIGHT KNEE ARTHROSCOPY ;  Surgeon: Hessie Dibble, MD;  Location: [facility];  Service: Orthopedics;  Laterality: Right;  partial lateral minisectomy and chondroplasty   WISDOM TOOTH EXTRACTION     WRIST SURGERY  2001   carpel tunnel -rt     Current Outpatient Medications  Medication Sig Dispense Refill   ACCU-CHEK GUIDE test strip   3   Acetaminophen (TYLENOL PO) Take by mouth as needed.     amphetamine-dextroamphetamine (ADDERALL) 10 MG tablet Take 1  tablet by mouth daily as directed 30 tablet 0   aspirin EC 81 MG tablet Take 81 mg by mouth daily.     cholecalciferol (VITAMIN D3) 25 MCG (1000 UT) tablet Take 1,000 Units by mouth daily.     clobetasol (TEMOVATE) 0.05 % external solution Apply topically 2 (two) times daily 50 mL 5   desonide (DESOWEN) 0.05 % cream Apply topically 2 (two) times daily as needed. 30 g 0   Empagliflozin-metFORMIN HCl ER (SYNJARDY XR) 03-999 MG TB24 Take 1 tablet by mouth with breakfast once a day 90 tablet 4   fluticasone (FLONASE) 50 MCG/ACT nasal spray PLACE 2 SPRAYS INTO EACH NOSTRIL AT BEDTIME 16 g 11   glucose blood (FREESTYLE LITE) test strip Use to check blood sugar 3 times daily 300 strip 3   glucose blood (FREESTYLE LITE) test strip Use to check blood sugar 3 times a day 300 strip 3   Ixekizumab (TALTZ) 80 MG/ML SOAJ Inject 80 mg subcutaneously every 28 (twenty-eight) days 1 mL 5   lisdexamfetamine (VYVANSE) 60 MG capsule Take 1 capsule (60 mg total) by mouth daily. 30 capsule 0   Omega-3 Fatty Acids (FISH OIL) 1200 MG CAPS Take 1-2 capsules (1,200-2,400 mg total) by mouth 2 (two) times daily. Takes 2400 mg qam and 1200  mg qpm     pravastatin (PRAVACHOL) 20 MG tablet Take 1 tablet (20 mg total) by mouth daily. 90 tablet 1   TURMERIC PO Take by mouth 2 (two) times daily.      amphetamine-dextroamphetamine (ADDERALL) 10 MG tablet Take 1 tablet by mouth daily as directed 30 tablet 0   clotrimazole-betamethasone (LOTRISONE) cream Apply to affected area(s) on skin 2 (two) times daily. 30 g 0   folic acid (FOLVITE) 1 MG tablet TAKE 1 TABLET BY MOUTH TWO TIMES DAILY (Patient not taking: Reported on 01/26/2022) 180 tablet 4   predniSONE (DELTASONE) 20 MG tablet Take 2 tablets (40 mg total) by mouth daily with breakfast. 10 tablet 0   No current facility-administered medications for this visit.    Allergies:   Chocolate, Food, Statins, and Latex    Social History:  The patient  reports that she quit smoking  about 26 years ago. Her smoking use included cigarettes. She started smoking about 37 years ago. She has a 15.60 pack-year smoking history. She has never used smokeless tobacco. She reports current alcohol use. She reports that she does not use drugs.   Family History:  The patient's family history includes Alzheimer's disease in her maternal grandmother; Breast cancer (age of onset: 85) in her mother; Cancer (age of onset: 63) in her mother; Diabetes in her father and paternal grandmother; Diabetes type II in an other family member; Heart disease in her father, maternal grandmother, and paternal grandfather; Hypertension in her brother, father, and another family member; Kidney disease in her father; Obesity in her brother and another family member; Stroke in her father.    ROS:  Please see the history of present illness.   Otherwise, review of systems are positive for rare palpitations.   All other systems are reviewed and negative.    PHYSICAL EXAM: VS:  BP 120/76   Pulse 79   Ht '5\' 2"'$  (1.575 m)   Wt 225 lb (102.1 kg)   SpO2 95%   BMI 41.15 kg/m  , BMI Body mass index is 41.15 kg/m. GEN: Well nourished, well developed, in no acute distress HEENT: normal Neck: no JVD, carotid bruits, or masses Cardiac: RRR; no murmurs, rubs, or gallops,no edema  Respiratory:  clear to auscultation bilaterally, normal work of breathing GI: soft, nontender, nondistended, + BS, obesity MS: no deformity or atrophy Skin: warm and dry, no rash Neuro:  Strength and sensation are intact Psych: euthymic mood, full affect   EKG:   The ekg ordered today demonstrates NSR, no ST changes   Recent Labs: 01/04/2022: ALT 22; Hemoglobin 15.5; Platelets 181 01/20/2022: BUN 18; Creat 0.68; Potassium 4.3; Sodium 140   Lipid Panel    Component Value Date/Time   CHOL 214 (H) 01/04/2022 0810   TRIG 123 01/04/2022 0810   HDL 64 01/04/2022 0810   CHOLHDL 3.3 01/04/2022 0810   VLDL 25.0 09/25/2019 0920   LDLCALC  127 (H) 01/04/2022 0810   LDLDIRECT 149 (H) 12/06/2011 1702     Other studies Reviewed: Additional studies/ records that were reviewed today with results demonstrating: labs reviewed.  Total cholesterol 214 HDL 64 LDL 127 triglycerides 123.   ASSESSMENT AND PLAN:  Palpitations: Known PACs and PVCs on prior monitor.  Caffeine can increase palpitations sometimes. Vyvanse has not changed frequency.    Family history of early coronary artery disease: Plan for calcium scoring CT.  This will help [place] risk assess and manage her lipids. DM: A1C 6.1 on meds.  She eats a healthy diet with lots of fiber.  Avoid processed foods. Hyperlipidemia: Elevated liver tests on simvastatin years ago, but she was drinking heavily and taking other liver toxic meds.  Recently started the pravastatin.  If the calcium score is high, I would consider switching to rosuvastatin. OSA: CPAP for OSA. Followed by pulmonary. Morbid obesity: Whole food, plant-based diet. High fiber diet.  Avoid processed foods.  Preoperative cardiovascular exam: No further cardiac testing needed before shoulder surgery.   Current medicines are reviewed at length with the patient today.  The patient concerns regarding her medicines were addressed.  The following changes have been made:  No change  Labs/ tests ordered today include:  No orders of the defined types were placed in this encounter.   Recommend 150 minutes/week of aerobic exercise Low fat, low carb, high fiber diet recommended  Disposition:   FU as needed; if palpitations recur, could plan on 2 week Zio patch.  Use Apple watch to monitor as well.   Signed, Larae Grooms, MD  01/26/2022 4:14 PM    [facility] [place], [place], [place]  [address] Phone: [phone]; Fax: [phone]

## 2022-01-26 NOTE — Patient Instructions (Signed)
Medication Instructions:  Your physician recommends that you continue on your current medications as directed. Please refer to the Current Medication list given to you today.  *If you need a refill on your cardiac medications before your next appointment, please call your pharmacy*  Testing/Procedures: Cardiac CT scoring Your physician has requested that you have a calcium score CT scan. There is a $99 fee for the scan.    Follow-Up: At [facility], you and your health needs are our priority.  As part of our continuing mission to provide you with exceptional heart care, we have created designated Provider Care Teams.  These Care Teams include your primary Cardiologist (physician) and Advanced Practice Providers (APPs -  Physician Assistants and Nurse Practitioners) who all work together to provide you with the care you need, when you need it.    Your next appointment:   PRN  The format for your next appointment:   In Person  Provider:   Dr. Irish Lack

## 2022-01-27 ENCOUNTER — Telehealth: Payer: Self-pay | Admitting: Interventional Cardiology

## 2022-01-27 ENCOUNTER — Ambulatory Visit (HOSPITAL_BASED_OUTPATIENT_CLINIC_OR_DEPARTMENT_OTHER)
Admission: RE | Admit: 2022-01-27 | Discharge: 2022-01-27 | Disposition: A | Discharge status: Home / Self Care | Payer: 59 | Source: Ambulatory Visit | Attending: Interventional Cardiology | Admitting: Interventional Cardiology

## 2022-01-27 DIAGNOSIS — R002 Palpitations: Secondary | ICD-10-CM | POA: Insufficient documentation

## 2022-01-27 NOTE — Telephone Encounter (Signed)
   Patient Name: [name]  DOB: 1965-06-21 MRN: [id number]  Primary Cardiologist: None  Chart reviewed as part of pre-operative protocol coverage. Given past medical history and time since last visit, based on ACC/AHA guidelines, Tabitha [name] would be at acceptable risk for the planned procedure without further cardiovascular testing. Patient was cleared for surgery by Dr. Irish Lack at [facility] on 01/26/2022.  I will route this recommendation and office note from most recent visit with Dr. Irish Lack to the requesting party via [facility] fax function and remove from pre-op pool.  Please call with questions.  Lenna Sciara, NP 01/27/2022, 4:22 PM

## 2022-01-27 NOTE — Telephone Encounter (Signed)
   Pre-operative Risk Assessment    Patient Name: Tabitha [name]  DOB: 12-Dec-1965 MRN: [id number]      Request for Surgical Clearance    Procedure:   Left Shoulder Rotator Cup Repair and Subacromial Decompression  Date of Surgery:  Clearance 02/16/22                                 Surgeon:    Dr. Tamera Punt Surgeon's Group or Practice Name:  [facility] Phone number:  [phone] Fax number:  [phone]   Type of Clearance Requested:   - Medical  -Pharmacy   Type of Anesthesia:  Not Indicated   Additional requests/questions:    Caller is requesting surgical clearance   Signed, Heloise Beecham   01/27/2022, 2:15 PM

## 2022-01-30 ENCOUNTER — Other Ambulatory Visit (HOSPITAL_COMMUNITY): Payer: Self-pay

## 2022-02-01 NOTE — [place] Notes (Unsigned)
[place] [facility] [place] Phone: [phone] Subjective:   Tabitha [name], am serving as a scribe for Dr. Hulan Saas.   I'm seeing this patient by the request  of:  Luetta Nutting, DO  CC: back and neck pain f/u   [id number]  Tabitha [name] is a 56 y.o. female coming in with complaint of back and neck pain. OMT 12/27/2021. Patient states her neck has been tight due to shoulder. Having shoulder surgery on 9/7.  Patient states because of this has not been doing as much heavy lifting with the upper extremities.  Symptoms still very uncomfortable at night.  Notices if she does not do any motion she does seem to be more tight.  Medications patient has been prescribed: None         Reviewed prior external information including notes and imaging from previsou exam, outside providers and external EMR if available.   As well as notes that were available from care everywhere and other healthcare systems.  Past medical history, social, surgical and family history all reviewed in electronic medical record.  [name] pertanent information unless stated regarding to the chief complaint.   Past Medical History:  Diagnosis Date   Arthritis    Asthma    De Quervain's disease (tenosynovitis)    Dermatophytosis, scalp    Diabetes mellitus    Hypercholesteremia    Obesity    Polycystic ovarian disease    Psoriatic arthritis (HCC)    Wears glasses     Allergies  Allergen Reactions   Chocolate Anaphylaxis   Food     chocolate   Statins     Intolerant  lft elevation   Latex Rash     Review of Systems:  [name] headache, visual changes, nausea, vomiting, diarrhea, constipation, dizziness, abdominal pain, skin rash, fevers, chills, night sweats, weight loss, swollen lymph nodes, body aches, joint swelling, chest pain, shortness of breath, mood changes. POSITIVE muscle aches  Objective  Blood pressure 124/70, pulse 83, height '5\' 2"'$   (1.575 m), weight 221 lb (100.2 kg), SpO2 97 %.   General: [name] apparent distress alert and oriented x3 mood and affect normal, dressed appropriately.  HEENT: Pupils equal, extraocular movements intact  Respiratory: Patient's speak in full sentences and does not appear short of breath  Cardiovascular: [name] lower extremity edema, non tender, [name] erythema  Gait normal  MSK:  Back: Low back exam does have tightness noted in the paraspinal musculature of the lumbar spine.  Some thoracolumbar junction noted as well.  [name] spinous process tenderness throughout. Deferred shoulder exam secondary to patient having surgery in the near future.  Osteopathic findings  C2 flexed rotated and side bent right C7 flexed rotated and side bent left T3 extended rotated and side bent right inhaled rib T9 extended rotated and side bent left T11 flexed rotated and side bent left L2 flexed rotated and side bent right Sacrum right on right       Assessment and Plan:  Psoriatic arthritis ([place]) Chronic problem.  On Donnetta Hail  We discussed with patient to continue to monitor.  He is going to having surgery for the shoulder and will follow-up with me 6 weeks afterwards.  Patient is in agreement.  Low back pain More patient does move the better she seems to do.  Because of her shoulder has not been quite as active as usual.  Discussed posture and ergonomics, discussed core strengthening.  Follow-up again in 6  to 8 weeks otherwise.    Nonallopathic problems  Decision today to treat with OMT was based on Physical Exam  After verbal consent patient was treated with HVLA, ME, FPR techniques in cervical, rib, thoracic, lumbar, and sacral  areas  Patient tolerated the procedure well with improvement in symptoms  Patient given exercises, stretches and lifestyle modifications  See medications in patient instructions if given  Patient will follow up in 4-8 weeks    The above documentation has been reviewed and is  accurate and complete Lyndal Pulley, DO          Note: This dictation was prepared with Dragon dictation along with smaller phrase technology. Any transcriptional errors that result from this process are unintentional.

## 2022-02-02 ENCOUNTER — Ambulatory Visit: Payer: 59 | Admitting: Family Medicine

## 2022-02-02 VITALS — BP 124/70 | HR 83 | Ht 62.0 in | Wt 221.0 lb

## 2022-02-02 DIAGNOSIS — M9904 Segmental and somatic dysfunction of sacral region: Secondary | ICD-10-CM

## 2022-02-02 DIAGNOSIS — M9902 Segmental and somatic dysfunction of thoracic region: Secondary | ICD-10-CM

## 2022-02-02 DIAGNOSIS — M9901 Segmental and somatic dysfunction of cervical region: Secondary | ICD-10-CM

## 2022-02-02 DIAGNOSIS — M545 Low back pain, unspecified: Secondary | ICD-10-CM | POA: Diagnosis not present

## 2022-02-02 DIAGNOSIS — G8929 Other chronic pain: Secondary | ICD-10-CM

## 2022-02-02 DIAGNOSIS — M9908 Segmental and somatic dysfunction of rib cage: Secondary | ICD-10-CM

## 2022-02-02 DIAGNOSIS — M9903 Segmental and somatic dysfunction of lumbar region: Secondary | ICD-10-CM | POA: Diagnosis not present

## 2022-02-02 DIAGNOSIS — L4050 Arthropathic psoriasis, unspecified: Secondary | ICD-10-CM

## 2022-02-02 NOTE — Assessment & Plan Note (Signed)
More patient does move the better she seems to do.  Because of her shoulder has not been quite as active as usual.  Discussed posture and ergonomics, discussed core strengthening.  Follow-up again in 6 to 8 weeks otherwise.

## 2022-02-02 NOTE — Patient Instructions (Addendum)
Good to see you You are in good hands with Dr. Tamera Punt See me again 6 weeks after surgery

## 2022-02-02 NOTE — Assessment & Plan Note (Signed)
Chronic problem.  On Tabitha [name]  We discussed with patient to continue to monitor.  He is going to having surgery for the shoulder and will follow-up with me 6 weeks afterwards.  Patient is in agreement.

## 2022-02-08 ENCOUNTER — Encounter: Payer: Self-pay | Admitting: Interventional Cardiology

## 2022-02-10 ENCOUNTER — Encounter: Payer: Self-pay | Admitting: Interventional Cardiology

## 2022-02-16 ENCOUNTER — Other Ambulatory Visit (HOSPITAL_BASED_OUTPATIENT_CLINIC_OR_DEPARTMENT_OTHER): Payer: Self-pay

## 2022-02-16 ENCOUNTER — Ambulatory Visit: Payer: 59 | Admitting: Family Medicine

## 2022-02-16 DIAGNOSIS — M7542 Impingement syndrome of left shoulder: Secondary | ICD-10-CM | POA: Diagnosis not present

## 2022-02-16 DIAGNOSIS — M75122 Complete rotator cuff tear or rupture of left shoulder, not specified as traumatic: Secondary | ICD-10-CM | POA: Diagnosis not present

## 2022-02-16 DIAGNOSIS — G8918 Other acute postprocedural pain: Secondary | ICD-10-CM | POA: Diagnosis not present

## 2022-02-16 DIAGNOSIS — M24112 Other articular cartilage disorders, left shoulder: Secondary | ICD-10-CM | POA: Diagnosis not present

## 2022-02-23 ENCOUNTER — Other Ambulatory Visit (HOSPITAL_COMMUNITY): Payer: Self-pay

## 2022-02-27 ENCOUNTER — Other Ambulatory Visit (HOSPITAL_COMMUNITY): Payer: Self-pay

## 2022-02-27 DIAGNOSIS — M75122 Complete rotator cuff tear or rupture of left shoulder, not specified as traumatic: Secondary | ICD-10-CM | POA: Diagnosis not present

## 2022-02-27 MED ORDER — LISDEXAMFETAMINE DIMESYLATE 60 MG PO CAPS
60.0000 mg | ORAL_CAPSULE | Freq: Every day | ORAL | 0 refills | Status: DC
  Filled 2022-02-27: qty 30, 30d supply, fill #0

## 2022-02-28 ENCOUNTER — Other Ambulatory Visit (HOSPITAL_BASED_OUTPATIENT_CLINIC_OR_DEPARTMENT_OTHER): Payer: Self-pay

## 2022-02-28 MED ORDER — CLENPIQ 10-3.5-12 MG-GM -GM/175ML PO SOLN
ORAL | 0 refills | Status: DC
  Filled 2022-02-28: qty 175, 1d supply, fill #0
  Filled 2022-05-26: qty 350, 1d supply, fill #0

## 2022-03-03 ENCOUNTER — Other Ambulatory Visit (HOSPITAL_COMMUNITY): Payer: Self-pay

## 2022-03-06 DIAGNOSIS — M75122 Complete rotator cuff tear or rupture of left shoulder, not specified as traumatic: Secondary | ICD-10-CM | POA: Diagnosis not present

## 2022-03-08 ENCOUNTER — Other Ambulatory Visit (HOSPITAL_BASED_OUTPATIENT_CLINIC_OR_DEPARTMENT_OTHER): Payer: Self-pay

## 2022-03-08 DIAGNOSIS — M75122 Complete rotator cuff tear or rupture of left shoulder, not specified as traumatic: Secondary | ICD-10-CM | POA: Diagnosis not present

## 2022-03-13 DIAGNOSIS — M75122 Complete rotator cuff tear or rupture of left shoulder, not specified as traumatic: Secondary | ICD-10-CM | POA: Diagnosis not present

## 2022-03-15 DIAGNOSIS — M75122 Complete rotator cuff tear or rupture of left shoulder, not specified as traumatic: Secondary | ICD-10-CM | POA: Diagnosis not present

## 2022-03-17 DIAGNOSIS — G4733 Obstructive sleep apnea (adult) (pediatric): Secondary | ICD-10-CM | POA: Diagnosis not present

## 2022-03-20 DIAGNOSIS — M75122 Complete rotator cuff tear or rupture of left shoulder, not specified as traumatic: Secondary | ICD-10-CM | POA: Diagnosis not present

## 2022-03-22 ENCOUNTER — Other Ambulatory Visit (HOSPITAL_COMMUNITY): Payer: Self-pay

## 2022-03-23 ENCOUNTER — Other Ambulatory Visit (HOSPITAL_COMMUNITY): Payer: Self-pay

## 2022-03-23 DIAGNOSIS — M75122 Complete rotator cuff tear or rupture of left shoulder, not specified as traumatic: Secondary | ICD-10-CM | POA: Diagnosis not present

## 2022-03-23 NOTE — Progress Notes (Deleted)
  [place] Phone: [phone] Subjective:    I'm seeing this patient by the request  of:  Luetta Nutting, DO  CC:   [id number]  Tabitha [name] is a 56 y.o. female coming in with complaint of back and neck pain. OMT on 02/02/2022. Patient states   Medications patient has been prescribed: None  Taking:         Reviewed prior external information including notes and imaging from previsou exam, outside providers and external EMR if available.   As well as notes that were available from care everywhere and other healthcare systems.  Past medical history, social, surgical and family history all reviewed in electronic medical record.  No pertanent information unless stated regarding to the chief complaint.   Past Medical History:  Diagnosis Date   Arthritis    Asthma    De Quervain's disease (tenosynovitis)    Dermatophytosis, scalp    Diabetes mellitus    Hypercholesteremia    Obesity    Polycystic ovarian disease    Psoriatic arthritis (HCC)    Wears glasses     Allergies  Allergen Reactions   Chocolate Anaphylaxis   Food     chocolate   Statins     Intolerant  lft elevation   Latex Rash     Review of Systems:  No headache, visual changes, nausea, vomiting, diarrhea, constipation, dizziness, abdominal pain, skin rash, fevers, chills, night sweats, weight loss, swollen lymph nodes, body aches, joint swelling, chest pain, shortness of breath, mood changes. POSITIVE muscle aches  Objective  There were no vitals taken for this visit.   General: No apparent distress alert and oriented x3 mood and affect normal, dressed appropriately.  HEENT: Pupils equal, extraocular movements intact  Respiratory: Patient's speak in full sentences and does not appear short of breath  Cardiovascular: No lower extremity edema, non tender, no erythema  Gait MSK:  Back   Osteopathic findings  C2 flexed  rotated and side bent right C6 flexed rotated and side bent left T3 extended rotated and side bent right inhaled rib T9 extended rotated and side bent left L2 flexed rotated and side bent right Sacrum right on right       Assessment and Plan:  No problem-specific Assessment & Plan notes found for this encounter.    Nonallopathic problems  Decision today to treat with OMT was based on Physical Exam  After verbal consent patient was treated with HVLA, ME, FPR techniques in cervical, rib, thoracic, lumbar, and sacral  areas  Patient tolerated the procedure well with improvement in symptoms  Patient given exercises, stretches and lifestyle modifications  See medications in patient instructions if given  Patient will follow up in 4-8 weeks             Note: This dictation was prepared with Dragon dictation along with smaller phrase technology. Any transcriptional errors that result from this process are unintentional.

## 2022-03-24 ENCOUNTER — Other Ambulatory Visit (HOSPITAL_COMMUNITY): Payer: Self-pay

## 2022-03-24 ENCOUNTER — Other Ambulatory Visit
Admission: RE | Admit: 2022-03-24 | Discharge: 2022-03-24 | Disposition: A | Discharge status: Home / Self Care | Payer: 59 | Source: Ambulatory Visit | Attending: Rheumatology | Admitting: Rheumatology

## 2022-03-24 ENCOUNTER — Other Ambulatory Visit: Payer: Self-pay | Admitting: Pharmacist

## 2022-03-24 DIAGNOSIS — M25461 Effusion, right knee: Secondary | ICD-10-CM | POA: Diagnosis not present

## 2022-03-24 DIAGNOSIS — L4050 Arthropathic psoriasis, unspecified: Secondary | ICD-10-CM | POA: Diagnosis not present

## 2022-03-24 DIAGNOSIS — Z79899 Other long term (current) drug therapy: Secondary | ICD-10-CM | POA: Diagnosis not present

## 2022-03-24 DIAGNOSIS — M159 Polyosteoarthritis, unspecified: Secondary | ICD-10-CM | POA: Diagnosis not present

## 2022-03-24 DIAGNOSIS — L409 Psoriasis, unspecified: Secondary | ICD-10-CM | POA: Diagnosis not present

## 2022-03-24 LAB — SYNOVIAL CELL COUNT + DIFF, W/ CRYSTALS
Crystals, Fluid: NONE SEEN
Eosinophils-Synovial: 0 %
Lymphocytes-Synovial Fld: 40 %
Monocyte-Macrophage-Synovial Fluid: 38 %
Neutrophil, Synovial: 22 %
WBC, Synovial: 1356 /mm3 — ABNORMAL HIGH (ref 0–200)

## 2022-03-24 MED ORDER — TALTZ 80 MG/ML ~~LOC~~ SOAJ: SUBCUTANEOUS | 5 refills | Status: DC

## 2022-03-24 MED ORDER — TALTZ 80 MG/ML ~~LOC~~ SOAJ
SUBCUTANEOUS | 5 refills | Status: DC
  Filled 2022-03-24: qty 1, 28d supply, fill #0
  Filled 2022-03-24: qty 1, fill #0
  Filled 2022-04-20: qty 1, 28d supply, fill #1
  Filled 2022-05-16 – 2022-05-24 (×2): qty 1, 28d supply, fill #2
  Filled 2022-06-20: qty 1, 28d supply, fill #3
  Filled 2022-07-19: qty 1, 28d supply, fill #4

## 2022-03-28 ENCOUNTER — Other Ambulatory Visit (HOSPITAL_COMMUNITY): Payer: Self-pay

## 2022-03-29 ENCOUNTER — Ambulatory Visit: Payer: 59 | Admitting: Family Medicine

## 2022-03-29 DIAGNOSIS — M75122 Complete rotator cuff tear or rupture of left shoulder, not specified as traumatic: Secondary | ICD-10-CM | POA: Diagnosis not present

## 2022-03-30 ENCOUNTER — Encounter: Payer: Self-pay | Admitting: Family Medicine

## 2022-03-30 DIAGNOSIS — E0865 Diabetes mellitus due to underlying condition with hyperglycemia: Secondary | ICD-10-CM

## 2022-03-30 DIAGNOSIS — E7800 Pure hypercholesterolemia, unspecified: Secondary | ICD-10-CM

## 2022-03-30 DIAGNOSIS — R799 Abnormal finding of blood chemistry, unspecified: Secondary | ICD-10-CM

## 2022-03-30 DIAGNOSIS — E1169 Type 2 diabetes mellitus with other specified complication: Secondary | ICD-10-CM

## 2022-03-30 NOTE — Telephone Encounter (Signed)
Labs pended for provider's review. Patient is requesting for provider to review a CT Scan from 01/27/22.

## 2022-03-31 ENCOUNTER — Other Ambulatory Visit (HOSPITAL_BASED_OUTPATIENT_CLINIC_OR_DEPARTMENT_OTHER): Payer: Self-pay

## 2022-03-31 DIAGNOSIS — M75122 Complete rotator cuff tear or rupture of left shoulder, not specified as traumatic: Secondary | ICD-10-CM | POA: Diagnosis not present

## 2022-03-31 MED ORDER — LISDEXAMFETAMINE DIMESYLATE 60 MG PO CAPS
60.0000 mg | ORAL_CAPSULE | Freq: Every day | ORAL | 0 refills | Status: DC
  Filled 2022-03-31: qty 30, 30d supply, fill #0

## 2022-04-03 DIAGNOSIS — F4323 Adjustment disorder with mixed anxiety and depressed mood: Secondary | ICD-10-CM | POA: Diagnosis not present

## 2022-04-03 DIAGNOSIS — M545 Low back pain, unspecified: Secondary | ICD-10-CM | POA: Diagnosis not present

## 2022-04-04 DIAGNOSIS — E0865 Diabetes mellitus due to underlying condition with hyperglycemia: Secondary | ICD-10-CM | POA: Diagnosis not present

## 2022-04-04 DIAGNOSIS — R799 Abnormal finding of blood chemistry, unspecified: Secondary | ICD-10-CM | POA: Diagnosis not present

## 2022-04-04 DIAGNOSIS — E785 Hyperlipidemia, unspecified: Secondary | ICD-10-CM | POA: Diagnosis not present

## 2022-04-04 DIAGNOSIS — E1169 Type 2 diabetes mellitus with other specified complication: Secondary | ICD-10-CM | POA: Diagnosis not present

## 2022-04-04 DIAGNOSIS — E7800 Pure hypercholesterolemia, unspecified: Secondary | ICD-10-CM | POA: Diagnosis not present

## 2022-04-05 LAB — COMPLETE METABOLIC PANEL WITH GFR
AG Ratio: 1.7 (calc) (ref 1.0–2.5)
ALT: 18 U/L (ref 6–29)
AST: 12 U/L (ref 10–35)
Albumin: 4.4 g/dL (ref 3.6–5.1)
Alkaline phosphatase (APISO): 82 U/L (ref 37–153)
BUN: 18 mg/dL (ref 7–25)
CO2: 28 mmol/L (ref 20–32)
Calcium: 9.9 mg/dL (ref 8.6–10.4)
Chloride: 103 mmol/L (ref 98–110)
Creat: 0.72 mg/dL (ref 0.50–1.03)
Globulin: 2.6 g/dL (calc) (ref 1.9–3.7)
Glucose, Bld: 140 mg/dL — ABNORMAL HIGH (ref 65–99)
Potassium: 4.4 mmol/L (ref 3.5–5.3)
Sodium: 138 mmol/L (ref 135–146)
Total Bilirubin: 0.5 mg/dL (ref 0.2–1.2)
Total Protein: 7 g/dL (ref 6.1–8.1)
eGFR: 97 mL/min/{1.73_m2} (ref >=60)

## 2022-04-05 LAB — CBC WITH DIFFERENTIAL/PLATELET
Absolute Monocytes: 687 cells/uL (ref 200–950)
Basophils Absolute: 47 cells/uL (ref 0–200)
Basophils Relative: 0.6 %
Eosinophils Absolute: 111 cells/uL (ref 15–500)
Eosinophils Relative: 1.4 %
HCT: 46.2 % — ABNORMAL HIGH (ref 35.0–45.0)
Hemoglobin: 15.5 g/dL (ref 11.7–15.5)
Lymphs Abs: 1525 cells/uL (ref 850–3900)
MCH: 30.6 pg (ref 27.0–33.0)
MCHC: 33.5 g/dL (ref 32.0–36.0)
MCV: 91.1 fL (ref 80.0–100.0)
MPV: 11.2 fL (ref 7.5–12.5)
Monocytes Relative: 8.7 %
Neutro Abs: 5530 cells/uL (ref 1500–7800)
Neutrophils Relative %: 70 %
Platelets: 182 10*3/uL (ref 140–400)
RBC: 5.07 10*6/uL (ref 3.80–5.10)
RDW: 11.8 % (ref 11.0–15.0)
Total Lymphocyte: 19.3 %
WBC: 7.9 10*3/uL (ref 3.8–10.8)

## 2022-04-05 LAB — LIPID PANEL W/REFLEX DIRECT LDL
Cholesterol: 177 mg/dL (ref <200)
HDL: 74 mg/dL (ref >=50)
LDL Cholesterol (Calc): 87 mg/dL (calc)
Non-HDL Cholesterol (Calc): 103 mg/dL (calc) (ref <130)
Total CHOL/HDL Ratio: 2.4 (calc) (ref <5.0)
Triglycerides: 71 mg/dL (ref <150)

## 2022-04-05 LAB — HEMOGLOBIN A1C
Hgb A1c MFr Bld: 6.5 % of total Hgb — ABNORMAL HIGH (ref <5.7)
Mean Plasma Glucose: 140 mg/dL
eAG (mmol/L): 7.7 mmol/L

## 2022-04-06 DIAGNOSIS — M545 Low back pain, unspecified: Secondary | ICD-10-CM | POA: Diagnosis not present

## 2022-04-07 ENCOUNTER — Other Ambulatory Visit (HOSPITAL_BASED_OUTPATIENT_CLINIC_OR_DEPARTMENT_OTHER): Payer: Self-pay

## 2022-04-07 ENCOUNTER — Ambulatory Visit: Payer: 59 | Admitting: Family Medicine

## 2022-04-07 ENCOUNTER — Encounter: Payer: Self-pay | Admitting: Family Medicine

## 2022-04-07 VITALS — BP 118/73 | HR 70 | Ht 62.0 in | Wt 221.0 lb

## 2022-04-07 DIAGNOSIS — E1169 Type 2 diabetes mellitus with other specified complication: Secondary | ICD-10-CM

## 2022-04-07 DIAGNOSIS — E785 Hyperlipidemia, unspecified: Secondary | ICD-10-CM

## 2022-04-07 DIAGNOSIS — F988 Other specified behavioral and emotional disorders with onset usually occurring in childhood and adolescence: Secondary | ICD-10-CM

## 2022-04-07 DIAGNOSIS — Z23 Encounter for immunization: Secondary | ICD-10-CM

## 2022-04-07 DIAGNOSIS — L4050 Arthropathic psoriasis, unspecified: Secondary | ICD-10-CM

## 2022-04-07 DIAGNOSIS — E0865 Diabetes mellitus due to underlying condition with hyperglycemia: Secondary | ICD-10-CM

## 2022-04-07 MED ORDER — SYNJARDY XR 10-1000 MG PO TB24
1.0000 | ORAL_TABLET | Freq: Every day | ORAL | 4 refills | Status: DC
  Filled 2022-04-07 – 2022-05-13 (×2): qty 90, 90d supply, fill #0
  Filled 2022-05-15: qty 90, fill #0
  Filled 2022-05-16 – 2022-06-21 (×3): qty 90, 90d supply, fill #0
  Filled 2022-09-19: qty 90, 90d supply, fill #1
  Filled 2023-01-05: qty 90, 90d supply, fill #2
  Filled 2023-03-31: qty 90, 90d supply, fill #3

## 2022-04-07 NOTE — Progress Notes (Signed)
Tabitha [name] - 56 y.o. female MRN [id number]  Date of birth: 09-13-1965  Subjective Chief Complaint  Patient presents with   Diabetes    HPI Tabitha [name] is a 56 y.o. female here today for follow up visit.    She reports that she is doing pretty well.  Recovering from rotator cuff repair.   Continues on [place] for treatment of T2DM.  Denies side effects with related to medications.  Blood sugars at home are doing ok.   She had labs completed prior to visit and her A1c has increased slightly to 6.5%. She has had some difficulty cooking since surgery and has not been quite as active.    Had coronary calcium score completed.  Incidental 31m nodule noted. Brief history of smoking. Quit >20 years  She continues to see rheumatology for management of Psoriatic arthritis.  Stable with Taltz.    ADHD is managed by [facility].  Stable with current medications.    ROS:  A comprehensive ROS was completed and negative except as noted per HPI                                        Allergies  Allergen Reactions   Chocolate Anaphylaxis   Food     chocolate   Statins     Intolerant  lft elevation   Latex Rash    Past Medical History:  Diagnosis Date   Arthritis    Asthma    De Quervain's disease (tenosynovitis)    Dermatophytosis, scalp    Diabetes mellitus    Hypercholesteremia    Obesity    Polycystic ovarian disease    Psoriatic arthritis ([facility]    Wears glasses     Past Surgical History:  Procedure Laterality Date   BREAST BIOPSY Right    CARPAL TUNNEL RELEASE     COLONOSCOPY     DE QUERVAIN'S RELEASE  2012   left   KNEE ARTHROPLASTY     KNEE ARTHROSCOPY Right 05/06/2013   Procedure: RIGHT KNEE ARTHROSCOPY ;  Surgeon: PHessie Dibble MD;  [place] [facility]  Service: Orthopedics;  Laterality: Right;  partial lateral minisectomy and chondroplasty   WISDOM TOOTH EXTRACTION     WRIST SURGERY  2001   carpel tunnel -rt    Social  History   Socioeconomic History   Marital status: Significant Other    Spouse name: Not on file   Number of children: Not on file   Years of education: Not on file   Highest education level: Not on file  Occupational History   Occupation: nurse    Employer: [employer]  Tobacco Use   Smoking status: Former    Packs/day: 1.30    Years: 12.00    Total pack years: 15.60    Types: Cigarettes    Start date: 06/12/1984    Quit date: 03/18/1995    Years since quitting: 27.0   Smokeless tobacco: Never   Tobacco comments:    Not interested in returning to smoking.   Vaping Use   Vaping Use: Never used  Substance and Sexual Activity   Alcohol use: Yes    Comment: rarely   Drug use: No   Sexual activity: Yes    Partners: Male    Comment: married  Other Topics Concern   Not on  file  Social History Narrative   Not on file   Social Determinants of Health   Financial Resource Strain: Not on file  Food Insecurity: Not on file  Transportation Needs: Not on file  Physical Activity: Not on file  Stress: Not on file  Social Connections: Not on file    Family History  Problem Relation Age of Onset   Diabetes Father    Hypertension Father    Stroke Father    Kidney disease Father    Heart disease Father    Cancer Mother 12       breast ca died at age 31   Breast cancer Mother 48   Hypertension Other    Diabetes type II Other    Obesity Other    Obesity Brother    Hypertension Brother    Heart disease Maternal Grandmother    Alzheimer's disease Maternal Grandmother    Diabetes Paternal Grandmother    Heart disease Paternal Grandfather     Health Maintenance  Topic Date Due   INFLUENZA VACCINE  01/10/2022   Diabetic kidney evaluation - Urine ACR  04/07/2022 (Originally 11/26/2021)   Zoster Vaccines- Shingrix (1 of 2) 04/07/2022 (Originally 08/13/1983)   COVID-19 Vaccine (8 - Mixed Product risk series) 07/13/2022 (Originally 10/24/2021)    MAMMOGRAM  01/06/2023 (Originally 11/02/2021)   COLONOSCOPY (Pts 45-23yr Insurance coverage will need to be confirmed)  01/06/2023 (Originally 12/27/2021)   Hepatitis C Screening  01/06/2023 (Originally 08/13/1982)   OPHTHALMOLOGY EXAM  10/01/2022   HEMOGLOBIN A1C  10/04/2022   FOOT EXAM  01/06/2023   Diabetic kidney evaluation - GFR measurement  04/05/2023   PAP SMEAR-Modifier  08/12/2025   TETANUS/TDAP  01/06/2032   HIV Screening  Completed   HPV VACCINES  Aged Out     ----------------------------------------------------------------------------------------------------------------------------------------------------------------------------------------------------------------- Physical Exam BP 118/73 (BP Location: Right Arm, Patient Position: Sitting, Cuff Size: Large)   Pulse 70   Ht '5\' 2"'$  (1.575 m)   Wt 221 lb (100.2 kg)   SpO2 97%   BMI 40.42 kg/m   Physical Exam Constitutional:      Appearance: Normal appearance.  Eyes:     General: No scleral icterus. Musculoskeletal:     Cervical back: Neck supple.  Neurological:     Mental Status: She is alert.  Psychiatric:        Mood and Affect: Mood normal.        Behavior: Behavior normal.     ------------------------------------------------------------------------------------------------------------------------------------------------------------------------------------------------------------------- Assessment and Plan  Hyperlipidemia associated with type 2 diabetes mellitus ([place] Lab Results  Component Value Date   LDLCALC 87 04/04/2022  Tolerating pravastatin well without elevation in LFT's.    ADD (attention deficit disorder) Management per [facility].  Stable with current medications.   Diabetes mellitus due to underlying condition with hyperglycemia ([facility] She is working on increasing activity and making dietary changes to get blood sugar under better control.  Continue synjardy for now.    Psoriatic arthritis ([place] Stable with Taltz.  Continue management per rheumatology.    No orders of the defined types were placed in this encounter.   Return in about 4 months (around 08/08/2022) for T2DM.    This visit occurred during the SARS-CoV-2 public health emergency.  Safety protocols were in place, including screening questions prior to the visit, additional usage of staff PPE, and extensive cleaning of exam room while observing appropriate contact time as indicated for disinfecting solutions.

## 2022-04-07 NOTE — Assessment & Plan Note (Signed)
Lab Results  Component Value Date   LDLCALC 87 04/04/2022  Tolerating pravastatin well without elevation in LFT's.

## 2022-04-07 NOTE — Assessment & Plan Note (Signed)
Stable with Taltz.  Continue management per rheumatology.

## 2022-04-07 NOTE — Assessment & Plan Note (Signed)
Management per [facility].  Stable with current medications.

## 2022-04-07 NOTE — Assessment & Plan Note (Signed)
She is working on increasing activity and making dietary changes to get blood sugar under better control.  Continue synjardy for now.

## 2022-04-11 DIAGNOSIS — M545 Low back pain, unspecified: Secondary | ICD-10-CM | POA: Diagnosis not present

## 2022-04-13 DIAGNOSIS — F4323 Adjustment disorder with mixed anxiety and depressed mood: Secondary | ICD-10-CM | POA: Diagnosis not present

## 2022-04-14 ENCOUNTER — Encounter: Payer: Self-pay | Admitting: Pulmonary Disease

## 2022-04-14 ENCOUNTER — Ambulatory Visit: Payer: 59 | Admitting: Pulmonary Disease

## 2022-04-14 VITALS — BP 116/70 | HR 87 | Temp 98.5°F | Ht 62.0 in | Wt 222.0 lb

## 2022-04-14 DIAGNOSIS — G4733 Obstructive sleep apnea (adult) (pediatric): Secondary | ICD-10-CM | POA: Diagnosis not present

## 2022-04-14 DIAGNOSIS — M545 Low back pain, unspecified: Secondary | ICD-10-CM | POA: Diagnosis not present

## 2022-04-14 DIAGNOSIS — R9389 Abnormal findings on diagnostic imaging of other specified body structures: Secondary | ICD-10-CM | POA: Diagnosis not present

## 2022-04-14 NOTE — Patient Instructions (Signed)
CT scan of the chest in 6 months to follow-up on lung nodule  Follow-up physically in the office in about a year  Continue to use CPAP nightly

## 2022-04-14 NOTE — Addendum Note (Signed)
Addended byDessie Coma on: 04/14/2022 02:11 PM   Modules accepted: Orders

## 2022-04-14 NOTE — Progress Notes (Signed)
Tabitha [name]    [id number]    September 28, 1965  Primary Care Physician:Matthews, Einar [place], DO  Referring Physician: Luetta Nutting, [facility] [address] [place],  [place] [address]  Chief complaint:    Follow-up for obstructive sleep apnea  HPI:  Continues to use CPAP on a nightly basis No significant concerns  Recently had a cardiac CT showing a 5 mm lung nodule  No previous CT scans She has no symptoms No shortness of breath, no chest pain or chest discomfort  Quit smoking over 30 years ago  Tries to stay active  Outpatient Encounter Medications as of 04/14/2022  Medication Sig   ACCU-CHEK GUIDE test strip    Acetaminophen (TYLENOL PO) Take by mouth as needed.   amphetamine-dextroamphetamine (ADDERALL) 10 MG tablet Take 1 tablet by mouth daily as directed   aspirin EC 81 MG tablet Take 81 mg by mouth daily.   cholecalciferol (VITAMIN D3) 25 MCG (1000 UT) tablet Take 1,000 Units by mouth daily.   clobetasol (TEMOVATE) 0.05 % external solution Apply topically 2 (two) times daily   clotrimazole-betamethasone (LOTRISONE) cream Apply to affected area(s) on skin 2 (two) times daily.   desonide (DESOWEN) 0.05 % cream Apply topically 2 (two) times daily as needed.   Empagliflozin-metFORMIN HCl ER (SYNJARDY XR) 03-999 MG TB24 Take 1 tablet by mouth daily with breakfast.   fluticasone (FLONASE) 50 MCG/ACT nasal spray PLACE 2 SPRAYS INTO EACH NOSTRIL AT BEDTIME   folic acid (FOLVITE) 1 MG tablet TAKE 1 TABLET BY MOUTH TWO TIMES DAILY   glucose blood (FREESTYLE LITE) test strip Use to check blood sugar 3 times daily   glucose blood (FREESTYLE LITE) test strip Use to check blood sugar 3 times a day   Ixekizumab (TALTZ) 80 MG/ML SOAJ Inject 80 mg subcutaneously every 28 (twenty-eight) days   lisdexamfetamine (VYVANSE) 60 MG capsule Take 1 capsule (60 mg total) by mouth daily.   Omega-3 Fatty Acids (FISH OIL) 1200 MG CAPS Take 1-2 capsules (1,200-2,400 mg total) by  mouth 2 (two) times daily. Takes 2400 mg qam and 1200 mg qpm   pravastatin (PRAVACHOL) 20 MG tablet Take 1 tablet (20 mg total) by mouth daily.   Sod Picosulfate-Mag Ox-Cit Acd (CLENPIQ) 10-3.5-12 MG-GM -GM/175ML SOLN use as directed per office (and NOT per instructions on packaging)   TURMERIC PO Take by mouth 2 (two) times daily.    [DISCONTINUED] escitalopram (LEXAPRO) 10 MG tablet Take 1 tablet (10 mg total) by mouth daily.   No facility-administered encounter medications on file as of 04/14/2022.    Allergies as of 04/14/2022 - Review Complete 04/14/2022  Allergen Reaction Noted   Chocolate Anaphylaxis 05/06/2013   Food  08/16/2013   Statins  05/28/2012   Latex Rash 05/06/2013    Past Medical History:  Diagnosis Date   Arthritis    Asthma    De Quervain's disease (tenosynovitis)    Dermatophytosis, scalp    Diabetes mellitus    Hypercholesteremia    Obesity    Polycystic ovarian disease    Psoriatic arthritis ([place])    Wears glasses     Past Surgical History:  Procedure Laterality Date   BREAST BIOPSY Right    CARPAL TUNNEL RELEASE     COLONOSCOPY     DE QUERVAIN'S RELEASE  2012   left   KNEE ARTHROPLASTY     KNEE ARTHROSCOPY Right 05/06/2013   Procedure: RIGHT KNEE ARTHROSCOPY ;  Surgeon: Hessie Dibble, MD;  Location: [facility];  Service: Orthopedics;  Laterality: Right;  partial lateral minisectomy and chondroplasty   WISDOM TOOTH EXTRACTION     WRIST SURGERY  2001   carpel tunnel -rt    Family History  Problem Relation Age of Onset   Diabetes Father    Hypertension Father    Stroke Father    Kidney disease Father    Heart disease Father    Cancer Mother 34       breast ca died at age 26   Breast cancer Mother 43   Hypertension Other    Diabetes type II Other    Obesity Other    Obesity Brother    Hypertension Brother    Heart disease Maternal Grandmother    Alzheimer's disease Maternal Grandmother    Diabetes Paternal Grandmother     Heart disease Paternal Grandfather     Social History   Socioeconomic History   Marital status: Significant Other    Spouse name: Not on file   Number of children: Not on file   Years of education: Not on file   Highest education level: Not on file  Occupational History   Occupation: nurse    Employer: [employer]    Comment: at [employer]  Tobacco Use   Smoking status: Former    Packs/day: 1.30    Years: 12.00    Total pack years: 15.60    Types: Cigarettes    Start date: 06/12/1984    Quit date: 03/18/1995    Years since quitting: 27.0   Smokeless tobacco: Never   Tobacco comments:    Not interested in returning to smoking.   Vaping Use   Vaping Use: Never used  Substance and Sexual Activity   Alcohol use: Yes    Comment: rarely   Drug use: No   Sexual activity: Yes    Partners: Male    Comment: married  Other Topics Concern   Not on file  Social History Narrative   Not on file   Social Determinants of Health   Financial Resource Strain: Not on file  Food Insecurity: Not on file  Transportation Needs: Not on file  Physical Activity: Not on file  Stress: Not on file  Social Connections: Not on file  Intimate Partner Violence: Not on file    Review of Systems  Constitutional:  Negative for fatigue.  Respiratory:  Positive for apnea.   Psychiatric/Behavioral:  Positive for sleep disturbance.     Vitals:   04/14/22 1330  BP: 116/70  Pulse: 87  Temp: 98.5 F (36.9 C)  SpO2: 97%     Physical Exam Constitutional:      Appearance: She is obese.  HENT:     Head: Normocephalic.     Mouth/Throat:     Mouth: Mucous membranes are moist.  Eyes:     Pupils: Pupils are equal, round, and reactive to light.  Cardiovascular:     Rate and Rhythm: Normal rate and regular rhythm.     Heart sounds: No murmur heard.    No friction rub.  Pulmonary:     Effort: No respiratory distress.     Breath sounds: No stridor. No wheezing or rhonchi.   Musculoskeletal:     Cervical back: No rigidity or tenderness.  Neurological:     Mental Status: She is alert.  Psychiatric:        Mood and Affect: Mood normal.    Sleep  study reviewed showing moderate obstructive sleep apnea  Compliance data reviewed showing Compliance with CPAP Average use of 6 hours 52 minutes Machine set between 5 and 15 95 percentile pressure of 9.8 Residual AHI of 0.8  Assessment:  Moderate obstructive sleep apnea  Class III obesity  Diabetes  History of polycystic ovaries  5 mm lung nodule in a previous smoker  Plan/Recommendations: Repeat CT in 6 months  Continue using CPAP on a nightly basis  Follow-up in a year from now  Encouraged to call with significant concerns   Sherrilyn Rist MD [facility] 04/14/2022, 1:39 PM  CC: Luetta Nutting, DO

## 2022-04-17 DIAGNOSIS — M545 Low back pain, unspecified: Secondary | ICD-10-CM | POA: Diagnosis not present

## 2022-04-18 ENCOUNTER — Other Ambulatory Visit (HOSPITAL_COMMUNITY): Payer: Self-pay

## 2022-04-20 ENCOUNTER — Other Ambulatory Visit (HOSPITAL_COMMUNITY): Payer: Self-pay

## 2022-04-21 DIAGNOSIS — M545 Low back pain, unspecified: Secondary | ICD-10-CM | POA: Diagnosis not present

## 2022-04-26 ENCOUNTER — Other Ambulatory Visit (HOSPITAL_COMMUNITY): Payer: Self-pay

## 2022-04-27 DIAGNOSIS — M25511 Pain in right shoulder: Secondary | ICD-10-CM | POA: Diagnosis not present

## 2022-04-27 DIAGNOSIS — M25512 Pain in left shoulder: Secondary | ICD-10-CM | POA: Diagnosis not present

## 2022-04-27 NOTE — Progress Notes (Deleted)
  [place] Phone: [phone] Subjective:    I'm seeing this patient by the request  of:  Luetta Nutting, DO  CC:   [id number]  Tabitha [name] is a 56 y.o. female coming in with complaint of back and neck pain. OMT 02/02/2022. Patient states   Medications patient has been prescribed: None  Taking:         Reviewed prior external information including notes and imaging from previsou exam, outside providers and external EMR if available.   As well as notes that were available from care everywhere and other healthcare systems.  Past medical history, social, surgical and family history all reviewed in electronic medical record.  No pertanent information unless stated regarding to the chief complaint.   Past Medical History:  Diagnosis Date   Arthritis    Asthma    De Quervain's disease (tenosynovitis)    Dermatophytosis, scalp    Diabetes mellitus    Hypercholesteremia    Obesity    Polycystic ovarian disease    Psoriatic arthritis (HCC)    Wears glasses     Allergies  Allergen Reactions   Chocolate Anaphylaxis   Food     chocolate   Statins     Intolerant  lft elevation   Latex Rash     Review of Systems:  No headache, visual changes, nausea, vomiting, diarrhea, constipation, dizziness, abdominal pain, skin rash, fevers, chills, night sweats, weight loss, swollen lymph nodes, body aches, joint swelling, chest pain, shortness of breath, mood changes. POSITIVE muscle aches  Objective  There were no vitals taken for this visit.   General: No apparent distress alert and oriented x3 mood and affect normal, dressed appropriately.  HEENT: Pupils equal, extraocular movements intact  Respiratory: Patient's speak in full sentences and does not appear short of breath  Cardiovascular: No lower extremity edema, non tender, no erythema  Gait MSK:  Back   Osteopathic findings  C2 flexed rotated  and side bent right C6 flexed rotated and side bent left T3 extended rotated and side bent right inhaled rib T9 extended rotated and side bent left L2 flexed rotated and side bent right Sacrum right on right       Assessment and Plan:  No problem-specific Assessment & Plan notes found for this encounter.    Nonallopathic problems  Decision today to treat with OMT was based on Physical Exam  After verbal consent patient was treated with HVLA, ME, FPR techniques in cervical, rib, thoracic, lumbar, and sacral  areas  Patient tolerated the procedure well with improvement in symptoms  Patient given exercises, stretches and lifestyle modifications  See medications in patient instructions if given  Patient will follow up in 4-8 weeks             Note: This dictation was prepared with Dragon dictation along with smaller phrase technology. Any transcriptional errors that result from this process are unintentional.

## 2022-04-28 ENCOUNTER — Other Ambulatory Visit (HOSPITAL_BASED_OUTPATIENT_CLINIC_OR_DEPARTMENT_OTHER): Payer: Self-pay

## 2022-04-28 ENCOUNTER — Telehealth: Payer: 59 | Admitting: Emergency Medicine

## 2022-04-28 DIAGNOSIS — M25511 Pain in right shoulder: Secondary | ICD-10-CM | POA: Diagnosis not present

## 2022-04-28 DIAGNOSIS — U071 COVID-19: Secondary | ICD-10-CM | POA: Diagnosis not present

## 2022-04-28 DIAGNOSIS — M25512 Pain in left shoulder: Secondary | ICD-10-CM | POA: Diagnosis not present

## 2022-04-28 MED ORDER — NIRMATRELVIR/RITONAVIR (PAXLOVID)TABLET
3.0000 | ORAL_TABLET | Freq: Two times a day (BID) | ORAL | 0 refills | Status: AC
  Filled 2022-04-28: qty 30, 5d supply, fill #0

## 2022-04-28 NOTE — Progress Notes (Signed)
Virtual Visit Consent   Tabitha [name], you are scheduled for a virtual visit with a [facility] provider today. Just as with appointments in the office, your consent must be obtained to participate. Your consent will be active for this visit and any virtual visit you may have with one of our providers in the next 365 days. If you have a MyChart account, a copy of this consent can be sent to you electronically.  As this is a virtual visit, video technology does not allow for your provider to perform a traditional examination. This may limit your provider's ability to fully assess your condition. If your provider identifies any concerns that need to be evaluated in person or the need to arrange testing (such as labs, EKG, etc.), we will make arrangements to do so. Although advances in technology are sophisticated, we cannot ensure that it will always work on either your end or our end. If the connection with a video visit is poor, the visit may have to be switched to a telephone visit. With either a video or telephone visit, we are not always able to ensure that we have a secure connection.  By engaging in this virtual visit, you consent to the provision of healthcare and authorize for your insurance to be billed (if applicable) for the services provided during this visit. Depending on your insurance coverage, you may receive a charge related to this service.  I need to obtain your verbal consent now. Are you willing to proceed with your visit today? Tabitha [name] has provided verbal consent on 04/28/2022 for a virtual visit (video or telephone). Carvel Getting, NP  Date: 04/28/2022 2:41 PM  Virtual Visit via Video Note   I, Carvel Getting, connected with  Tabitha [name]  ([id number], 22-Oct-1965) on 04/28/22 at  2:30 PM EST by a video-enabled telemedicine application and verified that I am speaking with the correct person using two identifiers.  Location: Patient: Virtual Visit Location Patient:  Home Provider: Virtual Visit Location Provider: Home Office   I discussed the limitations of evaluation and management by telemedicine and the availability of in person appointments. The patient expressed understanding and agreed to proceed.    History of Present Illness: Tabitha [name] is a 56 y.o. who identifies as a female who was assigned female at birth, and is being seen today for covid. Was away at a conference earlier this week. Today developed low fever 99.93F, sore throat, and body aches. Tested positive for COVID at home. Has had covid in the past and feels similar to prior illness except this time no cough. Denies congestion or HA; denies SOB. Does take pravastatin and took her dose today already.   HPI: HPI  Problems:  Patient Active Problem List   Diagnosis Date Noted   Elevated BUN 01/05/2022   ADD (attention deficit disorder) 05/12/2021   OSA (obstructive sleep apnea) 08/03/2020   Arthritis of both acromioclavicular joints 12/11/2019   Palpitations 12/12/2018   Nonallopathic lesion of lumbosacral region 03/14/2018   Low back pain 12/21/2017   Nonallopathic lesion of cervical region 12/21/2017   Primary osteoarthritis of both hands 10/30/2017   Primary osteoarthritis of both feet 10/30/2017   Degenerative arthritis of knee, bilateral 12/20/2016   Nonallopathic lesion of sacral region 10/04/2013   Morbid obesity ([place]) 05/28/2012   Psoriatic arthritis ([place]) 03/12/2012   Diabetes mellitus due to underlying condition with hyperglycemia ([place]) 10/03/2011   Hyperlipidemia associated with type 2 diabetes mellitus ([place])  05/25/2009    Allergies:  Allergies  Allergen Reactions   Chocolate Anaphylaxis   Food     chocolate   Statins     Intolerant  lft elevation   Latex Rash   Medications:  Current Outpatient Medications:    nirmatrelvir/ritonavir EUA (PAXLOVID) 20 x 150 MG & 10 x '100MG'$  TABS, Take 3 tablets by mouth 2 (two) times daily for 5 days. (Take nirmatrelvir 150 mg  two tablets twice daily for 5 days and ritonavir 100 mg one tablet twice daily for 5 days) Patient GFR is 97, Disp: 30 tablet, Rfl: 0   ACCU-CHEK GUIDE test strip, , Disp: , Rfl: 3   Acetaminophen (TYLENOL PO), Take by mouth as needed., Disp: , Rfl:    amphetamine-dextroamphetamine (ADDERALL) 10 MG tablet, Take 1 tablet by mouth daily as directed, Disp: 30 tablet, Rfl: 0   aspirin EC 81 MG tablet, Take 81 mg by mouth daily., Disp: , Rfl:    cholecalciferol (VITAMIN D3) 25 MCG (1000 UT) tablet, Take 1,000 Units by mouth daily., Disp: , Rfl:    clobetasol (TEMOVATE) 0.05 % external solution, Apply topically 2 (two) times daily, Disp: 50 mL, Rfl: 5   clotrimazole-betamethasone (LOTRISONE) cream, Apply to affected area(s) on skin 2 (two) times daily., Disp: 30 g, Rfl: 0   desonide (DESOWEN) 0.05 % cream, Apply topically 2 (two) times daily as needed., Disp: 30 g, Rfl: 0   Empagliflozin-metFORMIN HCl ER (SYNJARDY XR) 03-999 MG TB24, Take 1 tablet by mouth daily with breakfast., Disp: 90 tablet, Rfl: 4   fluticasone (FLONASE) 50 MCG/ACT nasal spray, PLACE 2 SPRAYS INTO EACH NOSTRIL AT BEDTIME, Disp: 16 g, Rfl: 11   folic acid (FOLVITE) 1 MG tablet, TAKE 1 TABLET BY MOUTH TWO TIMES DAILY, Disp: 180 tablet, Rfl: 4   glucose blood (FREESTYLE LITE) test strip, Use to check blood sugar 3 times daily, Disp: 300 strip, Rfl: 3   glucose blood (FREESTYLE LITE) test strip, Use to check blood sugar 3 times a day, Disp: 300 strip, Rfl: 3   Ixekizumab (TALTZ) 80 MG/ML SOAJ, Inject 80 mg subcutaneously every 28 (twenty-eight) days, Disp: 1 mL, Rfl: 5   lisdexamfetamine (VYVANSE) 60 MG capsule, Take 1 capsule (60 mg total) by mouth daily., Disp: 30 capsule, Rfl: 0   Omega-3 Fatty Acids (FISH OIL) 1200 MG CAPS, Take 1-2 capsules (1,200-2,400 mg total) by mouth 2 (two) times daily. Takes 2400 mg qam and 1200 mg qpm, Disp: , Rfl:    pravastatin (PRAVACHOL) 20 MG tablet, Take 1 tablet (20 mg total) by mouth daily., Disp:  90 tablet, Rfl: 1   Sod Picosulfate-Mag Ox-Cit Acd (CLENPIQ) 10-3.5-12 MG-GM -GM/175ML SOLN, use as directed per office (and NOT per instructions on packaging), Disp: 175 mL, Rfl: 0   TURMERIC PO, Take by mouth 2 (two) times daily. , Disp: , Rfl:   Observations/Objective: Patient is well-developed, well-nourished in no acute distress.  Resting comfortably  at home.  Head is normocephalic, atraumatic.  No labored breathing.  Speech is clear and coherent with logical content.  Patient is alert and oriented at baseline.    Assessment and Plan: 1. COVID-19  Rx paxlovid. Pt will hold pravastatin while taking it; will start rx tomorrow.   Follow Up Instructions: I discussed the assessment and treatment plan with the patient. The patient was provided an opportunity to ask questions and all were answered. The patient agreed with the plan and demonstrated an understanding of the instructions.  A copy  of instructions were sent to the patient via MyChart unless otherwise noted below.   The patient was advised to call back or seek an in-person evaluation if the symptoms worsen or if the condition fails to improve as anticipated.  Time:  I spent 10 minutes with the patient via telehealth technology discussing the above problems/concerns.    Carvel Getting, NP

## 2022-04-28 NOTE — Patient Instructions (Signed)
Wendall Mola, thank you for joining Carvel Getting, NP for today's virtual visit.  While this provider is not your primary care provider (PCP), if your PCP is located in our provider database this encounter information will be shared with them immediately following your visit.   [facility] account gives you access to today's visit and all your visits, tests, and labs performed at [facility] " click here if you don't have a [facility] account or go to [email or web address]  Consent: (Patient) Tabitha [name] provided verbal consent for this virtual visit at the beginning of the encounter.  Current Medications:  Current Outpatient Medications:    nirmatrelvir/ritonavir EUA (PAXLOVID) 20 x 150 MG & 10 x '100MG'$  TABS, Take 3 tablets by mouth 2 (two) times daily for 5 days. (Take nirmatrelvir 150 mg two tablets twice daily for 5 days and ritonavir 100 mg one tablet twice daily for 5 days) Patient GFR is 97, Disp: 30 tablet, Rfl: 0   ACCU-CHEK GUIDE test strip, , Disp: , Rfl: 3   Acetaminophen (TYLENOL PO), Take by mouth as needed., Disp: , Rfl:    amphetamine-dextroamphetamine (ADDERALL) 10 MG tablet, Take 1 tablet by mouth daily as directed, Disp: 30 tablet, Rfl: 0   aspirin EC 81 MG tablet, Take 81 mg by mouth daily., Disp: , Rfl:    cholecalciferol (VITAMIN D3) 25 MCG (1000 UT) tablet, Take 1,000 Units by mouth daily., Disp: , Rfl:    clobetasol (TEMOVATE) 0.05 % external solution, Apply topically 2 (two) times daily, Disp: 50 mL, Rfl: 5   clotrimazole-betamethasone (LOTRISONE) cream, Apply to affected area(s) on skin 2 (two) times daily., Disp: 30 g, Rfl: 0   desonide (DESOWEN) 0.05 % cream, Apply topically 2 (two) times daily as needed., Disp: 30 g, Rfl: 0   Empagliflozin-metFORMIN HCl ER (SYNJARDY XR) 03-999 MG TB24, Take 1 tablet by mouth daily with breakfast., Disp: 90 tablet, Rfl: 4   fluticasone (FLONASE) 50 MCG/ACT nasal spray, PLACE 2 SPRAYS INTO EACH  NOSTRIL AT BEDTIME, Disp: 16 g, Rfl: 11   folic acid (FOLVITE) 1 MG tablet, TAKE 1 TABLET BY MOUTH TWO TIMES DAILY, Disp: 180 tablet, Rfl: 4   glucose blood (FREESTYLE LITE) test strip, Use to check blood sugar 3 times daily, Disp: 300 strip, Rfl: 3   glucose blood (FREESTYLE LITE) test strip, Use to check blood sugar 3 times a day, Disp: 300 strip, Rfl: 3   Ixekizumab (TALTZ) 80 MG/ML SOAJ, Inject 80 mg subcutaneously every 28 (twenty-eight) days, Disp: 1 mL, Rfl: 5   lisdexamfetamine (VYVANSE) 60 MG capsule, Take 1 capsule (60 mg total) by mouth daily., Disp: 30 capsule, Rfl: 0   Omega-3 Fatty Acids (FISH OIL) 1200 MG CAPS, Take 1-2 capsules (1,200-2,400 mg total) by mouth 2 (two) times daily. Takes 2400 mg qam and 1200 mg qpm, Disp: , Rfl:    pravastatin (PRAVACHOL) 20 MG tablet, Take 1 tablet (20 mg total) by mouth daily., Disp: 90 tablet, Rfl: 1   Sod Picosulfate-Mag Ox-Cit Acd (CLENPIQ) 10-3.5-12 MG-GM -GM/175ML SOLN, use as directed per office (and NOT per instructions on packaging), Disp: 175 mL, Rfl: 0   TURMERIC PO, Take by mouth 2 (two) times daily. , Disp: , Rfl:    Medications ordered in this encounter:  Meds ordered this encounter  Medications   nirmatrelvir/ritonavir EUA (PAXLOVID) 20 x 150 MG & 10 x '100MG'$  TABS    Sig: Take 3 tablets by mouth 2 (two) times  daily for 5 days. (Take nirmatrelvir 150 mg two tablets twice daily for 5 days and ritonavir 100 mg one tablet twice daily for 5 days) Patient GFR is 97    Dispense:  30 tablet    Refill:  0     *If you need refills on other medications prior to your next appointment, please contact your pharmacy*  Follow-Up: Call back or seek an in-person evaluation if the symptoms worsen or if the condition fails to improve as anticipated.  [place] [phone]  Other Instructions Don't take your pravastatin while taking Paxlovid. Start the Paxlovid tomorrow since you have already taken pravastatin today.   Wear a  mask when around others for a 10 day period.   If you develop shortness of breath or have trouble breathing, please seek care in an emergency room.    If you have been instructed to have an in-person evaluation today at a local Urgent Care facility, please use the link below. It will take you to a list of all of our available [facility], including address, phone number and hours of operation. Please do not delay care.  [facility]  If you or a family member do not have a primary care provider, use the link below to schedule a visit and establish care. When you choose a [facility] primary care physician or advanced practice provider, you gain a long-term partner in health. Find a Primary Care Provider  Learn more about 's in-office and virtual care options: [facility]

## 2022-05-01 ENCOUNTER — Ambulatory Visit: Payer: 59 | Admitting: Family Medicine

## 2022-05-01 DIAGNOSIS — F4323 Adjustment disorder with mixed anxiety and depressed mood: Secondary | ICD-10-CM | POA: Diagnosis not present

## 2022-05-02 ENCOUNTER — Other Ambulatory Visit (HOSPITAL_BASED_OUTPATIENT_CLINIC_OR_DEPARTMENT_OTHER): Payer: Self-pay

## 2022-05-02 MED ORDER — LISDEXAMFETAMINE DIMESYLATE 60 MG PO CAPS
60.0000 mg | ORAL_CAPSULE | Freq: Every day | ORAL | 0 refills | Status: DC
  Filled 2022-05-02 (×2): qty 30, 30d supply, fill #0

## 2022-05-03 DIAGNOSIS — M25512 Pain in left shoulder: Secondary | ICD-10-CM | POA: Diagnosis not present

## 2022-05-03 DIAGNOSIS — M25511 Pain in right shoulder: Secondary | ICD-10-CM | POA: Diagnosis not present

## 2022-05-03 NOTE — Progress Notes (Unsigned)
  [place] Phone: [phone] Subjective:   [name], am serving as a scribe for Dr. Hulan Saas.  I'm seeing this patient by the request  of:  Luetta Nutting, DO  CC: back and neck pain follow up   WEX:[id number]  LADONNE SHARPLES is a 56 y.o. female coming in with complaint of back and neck pain. OMT 02/02/2022. Patient states that her shoulder is moving in the right direction. Sees Dr. Tamera Punt tmrw.   Having spasms in traps.   Medications patient has been prescribed: None  Taking:         Reviewed prior external information including notes and imaging from previsou exam, outside providers and external EMR if available.   As well as notes that were available from care everywhere and other healthcare systems.  Past medical history, social, surgical and family history all reviewed in electronic medical record.  No pertanent information unless stated regarding to the chief complaint.   Past Medical History:  Diagnosis Date   Arthritis    Asthma    De Quervain's disease (tenosynovitis)    Dermatophytosis, scalp    Diabetes mellitus    Hypercholesteremia    Obesity    Polycystic ovarian disease    Psoriatic arthritis (HCC)    Wears glasses     Allergies  Allergen Reactions   Chocolate Anaphylaxis   Food     chocolate   Statins     Intolerant  lft elevation   Latex Rash     Review of Systems:  No headache, visual changes, nausea, vomiting, diarrhea, constipation, dizziness, abdominal pain, skin rash, fevers, chills, night sweats, weight loss, swollen lymph nodes, body aches, joint swelling, chest pain, shortness of breath, mood changes. POSITIVE muscle aches  Objective  Blood pressure 124/74, pulse 88, height '5\' 2"'$  (1.575 m), weight 221 lb (100.2 kg), SpO2 98 %.   General: No apparent distress alert and oriented x3 mood and affect normal, dressed appropriately.  HEENT: Pupils equal,  extraocular movements intact  Respiratory: Patient's speak in full sentences and does not appear short of breath  Cardiovascular: No lower extremity edema, non tender, no erythema  Gait MSK:  Back low back does have loss of lordosis   Osteopathic findings  C3 flexed rotated and side bent right C6 flexed rotated and side bent left T3 extended rotated and side bent right inhaled rib T8 extended rotated and side bent left L2 flexed rotated and side bent right Sacrum right on right       Assessment and Plan:  No problem-specific Assessment & Plan notes found for this encounter.    Nonallopathic problems  Decision today to treat with OMT was based on Physical Exam  After verbal consent patient was treated with HVLA, ME, FPR techniques in cervical, rib, thoracic, lumbar, and sacral  areas  Patient tolerated the procedure well with improvement in symptoms  Patient given exercises, stretches and lifestyle modifications  See medications in patient instructions if given  Patient will follow up in 4-8 weeks    The above documentation has been reviewed and is accurate and complete Lyndal Pulley, DO          Note: This dictation was prepared with Dragon dictation along with smaller phrase technology. Any transcriptional errors that result from this process are unintentional.

## 2022-05-05 ENCOUNTER — Other Ambulatory Visit (HOSPITAL_BASED_OUTPATIENT_CLINIC_OR_DEPARTMENT_OTHER): Payer: Self-pay

## 2022-05-08 ENCOUNTER — Other Ambulatory Visit (HOSPITAL_BASED_OUTPATIENT_CLINIC_OR_DEPARTMENT_OTHER): Payer: Self-pay

## 2022-05-08 ENCOUNTER — Other Ambulatory Visit (HOSPITAL_COMMUNITY): Payer: Self-pay

## 2022-05-08 DIAGNOSIS — M25511 Pain in right shoulder: Secondary | ICD-10-CM | POA: Diagnosis not present

## 2022-05-08 DIAGNOSIS — M25512 Pain in left shoulder: Secondary | ICD-10-CM | POA: Diagnosis not present

## 2022-05-09 ENCOUNTER — Ambulatory Visit (INDEPENDENT_AMBULATORY_CARE_PROVIDER_SITE_OTHER): Payer: 59 | Admitting: Family Medicine

## 2022-05-09 ENCOUNTER — Other Ambulatory Visit (HOSPITAL_COMMUNITY): Payer: Self-pay

## 2022-05-09 ENCOUNTER — Encounter: Payer: Self-pay | Admitting: Family Medicine

## 2022-05-09 VITALS — BP 124/74 | HR 88 | Ht 62.0 in | Wt 221.0 lb

## 2022-05-09 DIAGNOSIS — M9904 Segmental and somatic dysfunction of sacral region: Secondary | ICD-10-CM

## 2022-05-09 DIAGNOSIS — G8929 Other chronic pain: Secondary | ICD-10-CM

## 2022-05-09 DIAGNOSIS — M9902 Segmental and somatic dysfunction of thoracic region: Secondary | ICD-10-CM

## 2022-05-09 DIAGNOSIS — M545 Low back pain, unspecified: Secondary | ICD-10-CM

## 2022-05-09 DIAGNOSIS — M9903 Segmental and somatic dysfunction of lumbar region: Secondary | ICD-10-CM

## 2022-05-09 DIAGNOSIS — M9908 Segmental and somatic dysfunction of rib cage: Secondary | ICD-10-CM

## 2022-05-09 DIAGNOSIS — M9901 Segmental and somatic dysfunction of cervical region: Secondary | ICD-10-CM | POA: Diagnosis not present

## 2022-05-09 MED ORDER — LISDEXAMFETAMINE DIMESYLATE 60 MG PO CAPS
60.0000 mg | ORAL_CAPSULE | Freq: Every day | ORAL | 0 refills | Status: DC
  Filled 2022-05-09: qty 30, 30d supply, fill #0

## 2022-05-09 NOTE — Patient Instructions (Signed)
Shoulder is doing great We are always here See me in 5-6 weeks

## 2022-05-09 NOTE — Assessment & Plan Note (Signed)
Loss of lordosis  Discussed HEP  Discussed core strengthening discussed with patient which activities to do and which ones to avoid.

## 2022-05-10 ENCOUNTER — Encounter: Payer: Self-pay | Admitting: Family Medicine

## 2022-05-11 DIAGNOSIS — M25519 Pain in unspecified shoulder: Secondary | ICD-10-CM | POA: Diagnosis not present

## 2022-05-11 DIAGNOSIS — F4323 Adjustment disorder with mixed anxiety and depressed mood: Secondary | ICD-10-CM | POA: Diagnosis not present

## 2022-05-13 ENCOUNTER — Other Ambulatory Visit: Payer: Self-pay | Admitting: Family Medicine

## 2022-05-15 ENCOUNTER — Other Ambulatory Visit: Payer: Self-pay | Admitting: *Deleted

## 2022-05-15 ENCOUNTER — Other Ambulatory Visit (HOSPITAL_BASED_OUTPATIENT_CLINIC_OR_DEPARTMENT_OTHER): Payer: Self-pay

## 2022-05-15 MED ORDER — FLUTICASONE PROPIONATE 50 MCG/ACT NA SUSP
2.0000 | Freq: Every day | NASAL | 11 refills | Status: DC
  Filled 2022-05-15: qty 16, 30d supply, fill #0
  Filled 2022-07-25: qty 16, 30d supply, fill #1
  Filled 2022-09-19: qty 16, 30d supply, fill #2
  Filled 2023-01-05: qty 16, 30d supply, fill #3

## 2022-05-16 ENCOUNTER — Other Ambulatory Visit (HOSPITAL_BASED_OUTPATIENT_CLINIC_OR_DEPARTMENT_OTHER): Payer: Self-pay

## 2022-05-16 ENCOUNTER — Other Ambulatory Visit (HOSPITAL_COMMUNITY): Payer: Self-pay

## 2022-05-16 DIAGNOSIS — M25519 Pain in unspecified shoulder: Secondary | ICD-10-CM | POA: Diagnosis not present

## 2022-05-17 DIAGNOSIS — F4323 Adjustment disorder with mixed anxiety and depressed mood: Secondary | ICD-10-CM | POA: Diagnosis not present

## 2022-05-19 DIAGNOSIS — M25519 Pain in unspecified shoulder: Secondary | ICD-10-CM | POA: Diagnosis not present

## 2022-05-23 DIAGNOSIS — F4323 Adjustment disorder with mixed anxiety and depressed mood: Secondary | ICD-10-CM | POA: Diagnosis not present

## 2022-05-23 DIAGNOSIS — M25519 Pain in unspecified shoulder: Secondary | ICD-10-CM | POA: Diagnosis not present

## 2022-05-24 ENCOUNTER — Other Ambulatory Visit (HOSPITAL_COMMUNITY): Payer: Self-pay

## 2022-05-24 ENCOUNTER — Other Ambulatory Visit: Payer: Self-pay

## 2022-05-25 ENCOUNTER — Other Ambulatory Visit: Payer: Self-pay

## 2022-05-25 ENCOUNTER — Other Ambulatory Visit (HOSPITAL_COMMUNITY): Payer: Self-pay

## 2022-05-25 DIAGNOSIS — Z79899 Other long term (current) drug therapy: Secondary | ICD-10-CM | POA: Diagnosis not present

## 2022-05-25 DIAGNOSIS — F902 Attention-deficit hyperactivity disorder, combined type: Secondary | ICD-10-CM | POA: Diagnosis not present

## 2022-05-26 ENCOUNTER — Other Ambulatory Visit (HOSPITAL_BASED_OUTPATIENT_CLINIC_OR_DEPARTMENT_OTHER): Payer: Self-pay

## 2022-05-26 ENCOUNTER — Other Ambulatory Visit: Payer: Self-pay

## 2022-05-26 DIAGNOSIS — M25519 Pain in unspecified shoulder: Secondary | ICD-10-CM | POA: Diagnosis not present

## 2022-05-29 ENCOUNTER — Other Ambulatory Visit (HOSPITAL_COMMUNITY): Payer: Self-pay

## 2022-05-29 DIAGNOSIS — E119 Type 2 diabetes mellitus without complications: Secondary | ICD-10-CM | POA: Diagnosis not present

## 2022-05-29 DIAGNOSIS — F902 Attention-deficit hyperactivity disorder, combined type: Secondary | ICD-10-CM | POA: Diagnosis not present

## 2022-05-29 DIAGNOSIS — K648 Other hemorrhoids: Secondary | ICD-10-CM | POA: Diagnosis not present

## 2022-05-29 DIAGNOSIS — Z1211 Encounter for screening for malignant neoplasm of colon: Secondary | ICD-10-CM | POA: Diagnosis not present

## 2022-05-29 DIAGNOSIS — K529 Noninfective gastroenteritis and colitis, unspecified: Secondary | ICD-10-CM | POA: Diagnosis not present

## 2022-05-30 DIAGNOSIS — F4323 Adjustment disorder with mixed anxiety and depressed mood: Secondary | ICD-10-CM | POA: Diagnosis not present

## 2022-05-31 ENCOUNTER — Ambulatory Visit: Payer: 59 | Admitting: Family Medicine

## 2022-05-31 DIAGNOSIS — M25519 Pain in unspecified shoulder: Secondary | ICD-10-CM | POA: Diagnosis not present

## 2022-06-02 DIAGNOSIS — M25519 Pain in unspecified shoulder: Secondary | ICD-10-CM | POA: Diagnosis not present

## 2022-06-07 DIAGNOSIS — F4323 Adjustment disorder with mixed anxiety and depressed mood: Secondary | ICD-10-CM | POA: Diagnosis not present

## 2022-06-08 ENCOUNTER — Other Ambulatory Visit (HOSPITAL_BASED_OUTPATIENT_CLINIC_OR_DEPARTMENT_OTHER): Payer: Self-pay

## 2022-06-08 DIAGNOSIS — M25519 Pain in unspecified shoulder: Secondary | ICD-10-CM | POA: Diagnosis not present

## 2022-06-08 MED ORDER — LISDEXAMFETAMINE DIMESYLATE 60 MG PO CAPS
60.0000 mg | ORAL_CAPSULE | Freq: Every day | ORAL | 0 refills | Status: DC
  Filled 2022-06-08: qty 30, 30d supply, fill #0

## 2022-06-13 ENCOUNTER — Ambulatory Visit: Payer: Self-pay | Admitting: Family Medicine

## 2022-06-13 NOTE — Progress Notes (Signed)
[name] [place] Phone: [phone] Subjective:   Rito Ehrlich, am serving as a scribe for Dr. Hulan Saas.  I'm seeing this patient by the request  of:  Luetta Nutting, DO  CC: back and neck pain follow up   LDJ:[id number]  Tabitha [name] is a 57 y.o. female coming in with complaint of back and neck pain. OMT 05/09/2022. Patient states continues to have some discomfort but is healing from her recent shoulder surgery.  Is making progress.  Feels like the strength is better.  Because of some immobility patient has not been as active and is having more tightness in the lower back  Medications patient has been prescribed: None          Reviewed prior external information including notes and imaging from previsou exam, outside providers and external EMR if available.   As well as notes that were available from care everywhere and other healthcare systems.  Patient did send a MyChart message to cardiology, seem to be going into A-fib recently, \recommendation from cardiology is to start Eliquis and referred to EP  Past medical history, social, surgical and family history all reviewed in electronic medical record.  No pertanent information unless stated regarding to the chief complaint.   Past Medical History:  Diagnosis Date   Arthritis    Asthma    De Quervain's disease (tenosynovitis)    Dermatophytosis, scalp    Diabetes mellitus    Hypercholesteremia    Obesity    Polycystic ovarian disease    Psoriatic arthritis (HCC)    Wears glasses     Allergies  Allergen Reactions   Chocolate Anaphylaxis   Food     chocolate   Statins     Intolerant  lft elevation   Latex Rash     Review of Systems:  No headache, visual changes, nausea, vomiting, diarrhea, constipation, dizziness, abdominal pain, skin rash, fevers, chills, night sweats, weight loss, swollen lymph nodes, body aches, joint swelling, chest  pain, shortness of breath, mood changes. POSITIVE muscle aches  Objective  Blood pressure 126/70, pulse 88, height '5\' 2"'$  (1.575 m), weight 222 lb (100.7 kg), SpO2 97 %.   General: No apparent distress alert and oriented x3 mood and affect normal, dressed appropriately.  HEENT: Pupils equal, extraocular movements intact  Respiratory: Patient's speak in full sentences and does not appear short of breath  Cardiovascular: No lower extremity edema, non tender, no erythema  Low back does have some loss of lordosis.  Some tenderness to palpation in the paraspinal musculature.  Osteopathic findings  C2 flexed rotated and side bent right C6 flexed rotated and side bent left T3 extended rotated and side bent right inhaled rib T9 extended rotated and side bent left L2 flexed rotated and side bent right Sacrum right on right       Assessment and Plan:  Low back pain Tightness due to patient not being able to be quite as active as usual.  Still has muscle relaxers including including Zanaflex 4 mg at night.  Discussed icing regimen and home exercises.  Increase activity as tolerated and follow-up with me again in 6 to 8 weeks.    Nonallopathic problems  Decision today to treat with OMT was based on Physical Exam  After verbal consent patient was treated with HVLA, ME, FPR techniques in cervical, rib, thoracic, lumbar, and sacral  areas  Patient tolerated the procedure well with improvement in  symptoms  Patient given exercises, stretches and lifestyle modifications  See medications in patient instructions if given  Patient will follow up in 4-8 weeks    The above documentation has been reviewed and is accurate and complete Lyndal Pulley, DO          Note: This dictation was prepared with Dragon dictation along with smaller phrase technology. Any transcriptional errors that result from this process are unintentional.

## 2022-06-14 ENCOUNTER — Encounter: Payer: Self-pay | Admitting: Interventional Cardiology

## 2022-06-14 DIAGNOSIS — M25519 Pain in unspecified shoulder: Secondary | ICD-10-CM | POA: Diagnosis not present

## 2022-06-14 DIAGNOSIS — I4891 Unspecified atrial fibrillation: Secondary | ICD-10-CM

## 2022-06-15 ENCOUNTER — Other Ambulatory Visit (HOSPITAL_BASED_OUTPATIENT_CLINIC_OR_DEPARTMENT_OTHER): Payer: Self-pay

## 2022-06-15 ENCOUNTER — Ambulatory Visit (INDEPENDENT_AMBULATORY_CARE_PROVIDER_SITE_OTHER): Payer: Commercial Managed Care - PPO | Admitting: Family Medicine

## 2022-06-15 VITALS — BP 126/70 | HR 88 | Ht 62.0 in | Wt 222.0 lb

## 2022-06-15 DIAGNOSIS — M9902 Segmental and somatic dysfunction of thoracic region: Secondary | ICD-10-CM | POA: Diagnosis not present

## 2022-06-15 DIAGNOSIS — M9903 Segmental and somatic dysfunction of lumbar region: Secondary | ICD-10-CM

## 2022-06-15 DIAGNOSIS — F4323 Adjustment disorder with mixed anxiety and depressed mood: Secondary | ICD-10-CM | POA: Diagnosis not present

## 2022-06-15 DIAGNOSIS — M9901 Segmental and somatic dysfunction of cervical region: Secondary | ICD-10-CM

## 2022-06-15 DIAGNOSIS — M9908 Segmental and somatic dysfunction of rib cage: Secondary | ICD-10-CM

## 2022-06-15 DIAGNOSIS — G8929 Other chronic pain: Secondary | ICD-10-CM | POA: Diagnosis not present

## 2022-06-15 DIAGNOSIS — M545 Low back pain, unspecified: Secondary | ICD-10-CM

## 2022-06-15 DIAGNOSIS — M9904 Segmental and somatic dysfunction of sacral region: Secondary | ICD-10-CM

## 2022-06-15 MED ORDER — METOPROLOL TARTRATE 25 MG PO TABS
12.5000 mg | ORAL_TABLET | Freq: Two times a day (BID) | ORAL | 3 refills | Status: DC
  Filled 2022-06-15: qty 90, 90d supply, fill #0

## 2022-06-15 NOTE — Telephone Encounter (Signed)
Tabitha Booze, MD  You;     I think three of the first four tracings show AFib with RVR.  Since she is having symptoms, I would refer to EP.  WOuld start Eliquis 5 mg BID.  Start metoprolol 12.5 mg BID to see if this decreases palpitations.  Since she is young with symptoms, I think early EP eval makes sense to see about more advanced therapy.  JV

## 2022-06-15 NOTE — Telephone Encounter (Signed)
I spoke with patient and gave her message from Dr Irish Lack.   Patient reports she has chronic nosebleeds.  Occurs 3-4 days per week.  Right nostril. Will sometimes take 30 minutes for bleeding to stop.  She has been evaluated for nose bleeds and it is a structural issue.  She has large vessels in this nostril and also has a deviated septum.  Has sleep apnea and uses CPAP which causes some ulcerations in her nostril. Patient will start metoprolol at this time.  EP referral placed. Will check with Dr Irish Lack to see if OK to start Eliquis with chronic nosebleeds.

## 2022-06-15 NOTE — Patient Instructions (Addendum)
Good to see you  What that ticker Other wise stay active Follow up in 6-8 weeks

## 2022-06-16 ENCOUNTER — Other Ambulatory Visit (HOSPITAL_COMMUNITY): Payer: Self-pay

## 2022-06-16 ENCOUNTER — Other Ambulatory Visit (HOSPITAL_BASED_OUTPATIENT_CLINIC_OR_DEPARTMENT_OTHER): Payer: Self-pay

## 2022-06-16 DIAGNOSIS — M25519 Pain in unspecified shoulder: Secondary | ICD-10-CM | POA: Diagnosis not present

## 2022-06-16 MED ORDER — APIXABAN 5 MG PO TABS
5.0000 mg | ORAL_TABLET | Freq: Two times a day (BID) | ORAL | 6 refills | Status: DC
  Filled 2022-06-16 – 2022-07-11 (×2): qty 60, 30d supply, fill #0

## 2022-06-16 NOTE — Telephone Encounter (Signed)
Jettie Booze, MD  to Me     06/15/22  4:27 PM OK to exercise as long as she feels well.  Can refer to ENT if nosebleeds worsen on ELiquis, to see if something like cauterization can be done to help stop bleeding.  Does she have f/u with ENT?  Reviewed with Dr Irish Lack and need to know when patient is seeing ENT again.  If not seeing then referral to ENT should be made.  Dr Irish Lack would like patient to start Eliquis 5 mg once daily for one week and then increase to twice daily if no bleeding.  Call placed to patient and message left to call office.

## 2022-06-16 NOTE — Assessment & Plan Note (Signed)
Tightness due to patient not being able to be quite as active as usual.  Still has muscle relaxers including including Zanaflex 4 mg at night.  Discussed icing regimen and home exercises.  Increase activity as tolerated and follow-up with me again in 6 to 8 weeks.

## 2022-06-16 NOTE — Telephone Encounter (Signed)
I spoke with patient and gave her message from Dr Irish Lack. She has not seen ENT recently. Previously saw Dr Wilburn Cornelia.  She reports in the past she was told cauterization may not help due to her structural issues.  Patient will contact Dr Victorio Palm office to schedule follow up.   Patient will start Eliquis as noted below and send update as to how she is tolerating it. Samples of Eliquis and free 30 day card and copay card left at front desk for patient to pick up. Prescription sent to patient's pharmacy.  Dr Irish Lack would like patient to stop aspirin when she starts Eliquis. I gave this message to patient's family member when here to pick up samples. He will let patient know.  Message also sent to patient through my chart.

## 2022-06-19 DIAGNOSIS — M25519 Pain in unspecified shoulder: Secondary | ICD-10-CM | POA: Diagnosis not present

## 2022-06-20 ENCOUNTER — Other Ambulatory Visit (HOSPITAL_COMMUNITY): Payer: Self-pay

## 2022-06-21 ENCOUNTER — Other Ambulatory Visit (HOSPITAL_BASED_OUTPATIENT_CLINIC_OR_DEPARTMENT_OTHER): Payer: Self-pay

## 2022-06-21 DIAGNOSIS — F4323 Adjustment disorder with mixed anxiety and depressed mood: Secondary | ICD-10-CM | POA: Diagnosis not present

## 2022-06-21 DIAGNOSIS — M25512 Pain in left shoulder: Secondary | ICD-10-CM | POA: Diagnosis not present

## 2022-06-23 DIAGNOSIS — M25519 Pain in unspecified shoulder: Secondary | ICD-10-CM | POA: Diagnosis not present

## 2022-06-26 ENCOUNTER — Other Ambulatory Visit: Payer: Self-pay

## 2022-06-28 DIAGNOSIS — M25519 Pain in unspecified shoulder: Secondary | ICD-10-CM | POA: Diagnosis not present

## 2022-06-29 DIAGNOSIS — F4323 Adjustment disorder with mixed anxiety and depressed mood: Secondary | ICD-10-CM | POA: Diagnosis not present

## 2022-06-30 DIAGNOSIS — M25519 Pain in unspecified shoulder: Secondary | ICD-10-CM | POA: Diagnosis not present

## 2022-07-04 DIAGNOSIS — F4323 Adjustment disorder with mixed anxiety and depressed mood: Secondary | ICD-10-CM | POA: Diagnosis not present

## 2022-07-05 DIAGNOSIS — M25519 Pain in unspecified shoulder: Secondary | ICD-10-CM | POA: Diagnosis not present

## 2022-07-07 DIAGNOSIS — M25519 Pain in unspecified shoulder: Secondary | ICD-10-CM | POA: Diagnosis not present

## 2022-07-10 ENCOUNTER — Other Ambulatory Visit: Payer: Self-pay | Admitting: Obstetrics and Gynecology

## 2022-07-10 DIAGNOSIS — Z1231 Encounter for screening mammogram for malignant neoplasm of breast: Secondary | ICD-10-CM

## 2022-07-11 ENCOUNTER — Other Ambulatory Visit (HOSPITAL_BASED_OUTPATIENT_CLINIC_OR_DEPARTMENT_OTHER): Payer: Self-pay

## 2022-07-12 ENCOUNTER — Other Ambulatory Visit (HOSPITAL_BASED_OUTPATIENT_CLINIC_OR_DEPARTMENT_OTHER): Payer: Self-pay

## 2022-07-12 ENCOUNTER — Ambulatory Visit (INDEPENDENT_AMBULATORY_CARE_PROVIDER_SITE_OTHER): Payer: Commercial Managed Care - PPO

## 2022-07-12 DIAGNOSIS — Z1231 Encounter for screening mammogram for malignant neoplasm of breast: Secondary | ICD-10-CM | POA: Diagnosis not present

## 2022-07-12 MED ORDER — LISDEXAMFETAMINE DIMESYLATE 60 MG PO CAPS
60.0000 mg | ORAL_CAPSULE | Freq: Every day | ORAL | 0 refills | Status: DC
Start: 2022-07-12 — End: 2022-08-10
  Filled 2022-07-12: qty 30, 30d supply, fill #0

## 2022-07-13 DIAGNOSIS — M25519 Pain in unspecified shoulder: Secondary | ICD-10-CM | POA: Diagnosis not present

## 2022-07-14 ENCOUNTER — Other Ambulatory Visit (HOSPITAL_BASED_OUTPATIENT_CLINIC_OR_DEPARTMENT_OTHER): Payer: Self-pay

## 2022-07-18 DIAGNOSIS — F4323 Adjustment disorder with mixed anxiety and depressed mood: Secondary | ICD-10-CM | POA: Diagnosis not present

## 2022-07-19 ENCOUNTER — Other Ambulatory Visit (HOSPITAL_COMMUNITY): Payer: Self-pay

## 2022-07-20 ENCOUNTER — Other Ambulatory Visit: Payer: Self-pay | Admitting: Pharmacist

## 2022-07-20 ENCOUNTER — Other Ambulatory Visit (HOSPITAL_COMMUNITY): Payer: Self-pay

## 2022-07-20 ENCOUNTER — Other Ambulatory Visit (HOSPITAL_BASED_OUTPATIENT_CLINIC_OR_DEPARTMENT_OTHER): Payer: Self-pay

## 2022-07-20 ENCOUNTER — Other Ambulatory Visit: Payer: Self-pay

## 2022-07-20 DIAGNOSIS — L4050 Arthropathic psoriasis, unspecified: Secondary | ICD-10-CM | POA: Diagnosis not present

## 2022-07-20 DIAGNOSIS — Z79899 Other long term (current) drug therapy: Secondary | ICD-10-CM | POA: Diagnosis not present

## 2022-07-20 DIAGNOSIS — L409 Psoriasis, unspecified: Secondary | ICD-10-CM | POA: Diagnosis not present

## 2022-07-20 DIAGNOSIS — M25519 Pain in unspecified shoulder: Secondary | ICD-10-CM | POA: Diagnosis not present

## 2022-07-20 DIAGNOSIS — M159 Polyosteoarthritis, unspecified: Secondary | ICD-10-CM | POA: Diagnosis not present

## 2022-07-20 MED ORDER — TALTZ 80 MG/ML ~~LOC~~ SOAJ
SUBCUTANEOUS | 5 refills | Status: DC
Start: 2022-07-20 — End: 2022-07-20

## 2022-07-20 MED ORDER — DESONIDE 0.05 % EX CREA
1.0000 | TOPICAL_CREAM | Freq: Two times a day (BID) | CUTANEOUS | 5 refills | Status: DC
  Filled 2022-07-20: qty 60, 30d supply, fill #0
  Filled 2023-01-05: qty 60, 30d supply, fill #1

## 2022-07-20 MED ORDER — TALTZ 80 MG/ML ~~LOC~~ SOAJ
SUBCUTANEOUS | 5 refills | Status: DC
  Filled 2022-07-20: qty 1, 28d supply, fill #0
  Filled 2022-08-16: qty 1, 28d supply, fill #1
  Filled 2022-09-13: qty 1, 28d supply, fill #2
  Filled 2022-10-10: qty 1, 28d supply, fill #3
  Filled 2022-11-08: qty 1, 28d supply, fill #4
  Filled 2022-12-06: qty 1, 28d supply, fill #5

## 2022-07-21 ENCOUNTER — Other Ambulatory Visit (HOSPITAL_COMMUNITY): Payer: Self-pay

## 2022-07-24 DIAGNOSIS — M25519 Pain in unspecified shoulder: Secondary | ICD-10-CM | POA: Diagnosis not present

## 2022-07-25 NOTE — Progress Notes (Unsigned)
[place] Phone: [phone] Subjective:   [name], am serving as a scribe for Dr. Hulan Saas.  I'm seeing this patient by the request  of:  Luetta Nutting, DO  CC: back and neck pain   [id number]  Tabitha [name] is a 57 y.o. female coming in with complaint of back and neck pain. OMT on 06/15/2022. Patient states that she is doing ok. Neck pain has improved. Has had a few flare ups but they have subsided.   Medications patient has been prescribed: None        Reviewed prior external information including notes and imaging from previsou exam, outside providers and external EMR if available.   As well as notes that were available from care everywhere and other healthcare systems.  Past medical history, social, surgical and family history all reviewed in electronic medical record.  No pertanent information unless stated regarding to the chief complaint.   Past Medical History:  Diagnosis Date   Arthritis    Asthma    De Quervain's disease (tenosynovitis)    Dermatophytosis, scalp    Diabetes mellitus    Hypercholesteremia    Obesity    Polycystic ovarian disease    Psoriatic arthritis (HCC)    Wears glasses     Allergies  Allergen Reactions   Chocolate Anaphylaxis   Food     chocolate   Statins     Intolerant  lft elevation   Latex Rash     Review of Systems:  No headache, visual changes, nausea, vomiting, diarrhea, constipation, dizziness, abdominal pain, skin rash, fevers, chills, night sweats, weight loss, swollen lymph nodes, body aches, joint swelling, chest pain, shortness of breath, mood changes. POSITIVE muscle aches  Objective  Blood pressure 124/70, pulse 94, height 5' 2"$  (1.575 m), weight 224 lb (101.6 kg), SpO2 99 %.   General: No apparent distress alert and oriented x3 mood and affect normal, dressed appropriately.  HEENT: Pupils equal, extraocular movements intact   Respiratory: Patient's speak in full sentences and does not appear short of breath  Cardiovascular: No lower extremity edema, non tender, no erythema  Gait MSK:  Back does have loss of lordosis tightness of FABER patient does have some limited range of motion in all planes.  Some mild increase in tightness with neck pain specially with sidebending.  Negative Spurling's.   Osteopathic findings  C3 flexed rotated and side bent right C5 flexed rotated and side bent left T3 extended rotated and side bent right inhaled rib T8 extended rotated and side bent left L2 flexed rotated and side bent right Sacrum right on right     Assessment and Plan:  Low back pain Low back exam does have some loss of lordosis noted.  Some tenderness to palpation in the paraspinal musculature still noted.  Patient no will start to work on exercises on a more regular basis.  Continue to work on her weight loss journey.  Discussed core strengthening.  No other changes in medication today.  Follow-up again in 6-8 weeks    Nonallopathic problems  Decision today to treat with OMT was based on Physical Exam  After verbal consent patient was treated with HVLA, ME, FPR techniques in cervical, rib, thoracic, lumbar, and sacral  areas  Patient tolerated the procedure well with improvement in symptoms  Patient given exercises, stretches and lifestyle modifications  See medications in patient instructions if given  Patient will follow  up in 4-8 weeks    The above documentation has been reviewed and is accurate and complete Lyndal Pulley, DO          Note: This dictation was prepared with Dragon dictation along with smaller phrase technology. Any transcriptional errors that result from this process are unintentional.

## 2022-07-26 ENCOUNTER — Ambulatory Visit: Payer: Commercial Managed Care - PPO | Admitting: Family Medicine

## 2022-07-26 ENCOUNTER — Other Ambulatory Visit (HOSPITAL_BASED_OUTPATIENT_CLINIC_OR_DEPARTMENT_OTHER): Payer: Self-pay

## 2022-07-26 VITALS — BP 124/70 | HR 94 | Ht 62.0 in | Wt 224.0 lb

## 2022-07-26 DIAGNOSIS — M9901 Segmental and somatic dysfunction of cervical region: Secondary | ICD-10-CM | POA: Diagnosis not present

## 2022-07-26 DIAGNOSIS — M545 Low back pain, unspecified: Secondary | ICD-10-CM

## 2022-07-26 DIAGNOSIS — M9902 Segmental and somatic dysfunction of thoracic region: Secondary | ICD-10-CM | POA: Diagnosis not present

## 2022-07-26 DIAGNOSIS — M9908 Segmental and somatic dysfunction of rib cage: Secondary | ICD-10-CM

## 2022-07-26 DIAGNOSIS — G8929 Other chronic pain: Secondary | ICD-10-CM | POA: Diagnosis not present

## 2022-07-26 DIAGNOSIS — M25519 Pain in unspecified shoulder: Secondary | ICD-10-CM | POA: Diagnosis not present

## 2022-07-26 DIAGNOSIS — M9904 Segmental and somatic dysfunction of sacral region: Secondary | ICD-10-CM

## 2022-07-26 DIAGNOSIS — M9903 Segmental and somatic dysfunction of lumbar region: Secondary | ICD-10-CM

## 2022-07-26 NOTE — Patient Instructions (Signed)
Always great to see you Thanks for sharing pictures See me in 6-8 weeks

## 2022-07-27 ENCOUNTER — Ambulatory Visit: Payer: Commercial Managed Care - PPO | Admitting: Family Medicine

## 2022-07-27 DIAGNOSIS — F4323 Adjustment disorder with mixed anxiety and depressed mood: Secondary | ICD-10-CM | POA: Diagnosis not present

## 2022-07-27 NOTE — Assessment & Plan Note (Signed)
Low back exam does have some loss of lordosis noted.  Some tenderness to palpation in the paraspinal musculature still noted.  Patient no will start to work on exercises on a more regular basis.  Continue to work on her weight loss journey.  Discussed core strengthening.  No other changes in medication today.  Follow-up again in 6-8 weeks

## 2022-07-28 ENCOUNTER — Encounter: Payer: Self-pay | Admitting: Cardiology

## 2022-07-28 ENCOUNTER — Ambulatory Visit: Payer: Commercial Managed Care - PPO | Attending: Cardiology | Admitting: Cardiology

## 2022-07-28 ENCOUNTER — Other Ambulatory Visit (HOSPITAL_BASED_OUTPATIENT_CLINIC_OR_DEPARTMENT_OTHER): Payer: Self-pay

## 2022-07-28 DIAGNOSIS — G4733 Obstructive sleep apnea (adult) (pediatric): Secondary | ICD-10-CM | POA: Diagnosis not present

## 2022-07-28 DIAGNOSIS — I480 Paroxysmal atrial fibrillation: Secondary | ICD-10-CM

## 2022-07-28 MED ORDER — METOPROLOL SUCCINATE ER 100 MG PO TB24
100.0000 mg | ORAL_TABLET | Freq: Every day | ORAL | 3 refills | Status: DC
  Filled 2022-07-28: qty 30, 30d supply, fill #0

## 2022-07-28 NOTE — Patient Instructions (Addendum)
Medication Instructions:  Your physician has recommended you make the following change in your medication:  STOP Metoprolol Tartrate (Lopressor) START Metoprolol Succinate (Toprol) 100 mg daily at bedtime  *If you need a refill on your cardiac medications before your next appointment, please call your pharmacy*   Lab Work: None ordered   Testing/Procedures: Your physician has requested that you have an echocardiogram. Echocardiography is a painless test that uses sound waves to create images of your heart. It provides your doctor with information about the size and shape of your heart and how well your heart's chambers and valves are working. This procedure takes approximately one hour. There are no restrictions for this procedure. Please do NOT wear cologne, perfume, aftershave, or lotions (deodorant is allowed). Please arrive 15 minutes prior to your appointment time.   Follow-Up: At [facility], you and your health needs are our priority.  As part of our continuing mission to provide you with exceptional heart care, we have created designated Provider Care Teams.  These Care Teams include your primary Cardiologist (physician) and Advanced Practice Providers (APPs -  Physician Assistants and Nurse Practitioners) who all work together to provide you with the care you need, when you need it.   Your next appointment:   3 month(s)  The format for your next appointment:   In Person  Provider:   Allegra Lai, MD    You have been referred to Medical Weight Management    Thank you for choosing [place] [place]!!   Trinidad Curet, RN [phone]  Other Instructions

## 2022-07-28 NOTE — Progress Notes (Signed)
Electrophysiology Office Note   Date:  07/28/2022   ID:  Tabitha [name], DOB 1965/12/25, MRN [id number]  PCP:  Luetta Nutting, DO  Cardiologist:  Irish Lack Primary Electrophysiologist:  Degan Hanser Meredith Leeds, MD    Chief Complaint: palpitations   History of Present Illness: Tabitha [name] is a 57 y.o. female who is being seen today for the evaluation of palpitations at the request of Jettie Booze, MD. Presenting today for electrophysiology evaluation.  She has a past medical history of arthritis, asthma, ADHD, type 2 DM, obesity, and OSA. Endorses history of intermittent episodes of heart palpitations over the past several months. She wears an Apple watch and has captured several readings during episodes which were concerning for an abnormal heart rhythm. Dr. Irish Lack received rhythm strips from patient and placed her Eliquis 5 mg BID and metoprolol (lopressor) 12.5 mg BID due to concern for new onset atrial fibrillation. Prior cardiology workup includes Holter monitor in 2020 showing rare PAC's and PVC's as well as cardiac CT showing coronary calcium score of 0.  She can feel episodes of atrial fibrillation and endorses heart fluttering as well as dizziness. Episodes are intermittent in nature, possibly occur when she feels frustrated, anxious, or dehydrated, and duration is 1-1.5 hours. She provides today rhythm strips from her Apple watch that show intermittent episodes of atrial fibrillation.   Today, she denies symptoms of palpitations, chest pain, shortness of breath, orthopnea, PND, lower extremity edema, claudication, dizziness, presyncope, syncope, bleeding, or neurologic sequela. The patient is tolerating medications without difficulties.    Past Medical History:  Diagnosis Date   Arthritis    Asthma    De Quervain's disease (tenosynovitis)    Dermatophytosis, scalp    Diabetes mellitus    Hypercholesteremia    Obesity    Polycystic ovarian disease    Psoriatic  arthritis ([place])    Wears glasses    Past Surgical History:  Procedure Laterality Date   BREAST BIOPSY Right    CARPAL TUNNEL RELEASE     COLONOSCOPY     DE QUERVAIN'S RELEASE  2012   left   KNEE ARTHROPLASTY     KNEE ARTHROSCOPY Right 05/06/2013   Procedure: RIGHT KNEE ARTHROSCOPY ;  Surgeon: Hessie Dibble, MD;  Location: [facility];  Service: Orthopedics;  Laterality: Right;  partial lateral minisectomy and chondroplasty   WISDOM TOOTH EXTRACTION     WRIST SURGERY  2001   carpel tunnel -rt     Current Outpatient Medications  Medication Sig Dispense Refill   ACCU-CHEK GUIDE test strip   3   Acetaminophen (TYLENOL PO) Take by mouth as needed.     amphetamine-dextroamphetamine (ADDERALL) 10 MG tablet Take 1 tablet by mouth daily as directed 30 tablet 0   apixaban (ELIQUIS) 5 MG TABS tablet Take 1 tablet (5 mg total) by mouth 2 (two) times daily. 60 tablet 6   cholecalciferol (VITAMIN D3) 25 MCG (1000 UT) tablet Take 1,000 Units by mouth daily.     clobetasol (TEMOVATE) 0.05 % external solution Apply topically 2 (two) times daily (Patient taking differently: Apply topically as needed.) 50 mL 5   desonide (DESOWEN) 0.05 % cream Apply 1 application topically 2 (two) times daily. 60 g 5   Empagliflozin-metFORMIN HCl ER (SYNJARDY XR) 03-999 MG TB24 Take 1 tablet by mouth daily with breakfast. 90 tablet 4   fluticasone (FLONASE) 50 MCG/ACT nasal spray Place 2 sprays into both nostrils at bedtime. 16 g 11  glucose blood (FREESTYLE LITE) test strip Use to check blood sugar 3 times daily 300 strip 3   glucose blood (FREESTYLE LITE) test strip Use to check blood sugar 3 times a day 300 strip 3   Ixekizumab (TALTZ) 80 MG/ML SOAJ Inject 80 mg subcutaneously every 28 (twenty-eight) days 1 mL 5   lisdexamfetamine (VYVANSE) 60 MG capsule Take 1 capsule (60 mg total) by mouth daily. 30 capsule 0   metoprolol succinate (TOPROL-XL) 100 MG 24 hr tablet Take 1 tablet (100 mg total) by  mouth daily. Take with or immediately following a meal. 30 tablet 3   Omega-3 Fatty Acids (FISH OIL) 1200 MG CAPS Take 1-2 capsules (1,200-2,400 mg total) by mouth 2 (two) times daily. Takes 2400 mg qam and 1200 mg qpm     pravastatin (PRAVACHOL) 20 MG tablet Take 1 tablet (20 mg total) by mouth daily. 90 tablet 1   Sod Picosulfate-Mag Ox-Cit Acd (CLENPIQ) 10-3.5-12 MG-GM -GM/175ML SOLN use as directed per office (and NOT per instructions on packaging) 350 mL 0   TURMERIC PO Take by mouth 2 (two) times daily.      clotrimazole-betamethasone (LOTRISONE) cream Apply to affected area(s) on skin 2 (two) times daily. (Patient not taking: Reported on Q000111Q) 30 g 0   folic acid (FOLVITE) 1 MG tablet TAKE 1 TABLET BY MOUTH TWO TIMES DAILY (Patient not taking: Reported on 07/28/2022) 180 tablet 4   No current facility-administered medications for this visit.    Allergies:   Chocolate, Food, Statins, and Latex   Social History:  The patient  reports that she quit smoking about 27 years ago. Her smoking use included cigarettes. She started smoking about 38 years ago. She has a 15.60 pack-year smoking history. She has never used smokeless tobacco. She reports current alcohol use. She reports that she does not use drugs.   Family History:  The patient's family history includes Alzheimer's disease in her maternal grandmother; Breast cancer (age of onset: 28) in her mother; Cancer (age of onset: 20) in her mother; Diabetes in her father and paternal grandmother; Diabetes type II in an other family member; Heart disease in her father, maternal grandmother, and paternal grandfather; Hypertension in her brother, father, and another family member; Kidney disease in her father; Obesity in her brother and another family member; Stroke in her father.    ROS:  Please see the history of present illness.   Otherwise, review of systems is positive for none.   All other systems are reviewed and negative.    PHYSICAL  EXAM: VS:  BP 124/64   Pulse 64   Ht 5' 2"$  (1.575 m)   Wt 224 lb (101.6 kg)   SpO2 99%   BMI 40.97 kg/m  , BMI Body mass index is 40.97 kg/m. GEN: Well nourished, well developed, in no acute distress. Obese habitus noted. HEENT: normal  Neck: no JVD, carotid bruits, or masses Cardiac: RRR; no murmurs, rubs, or gallops,no edema  Respiratory:  clear to auscultation bilaterally, normal work of breathing GI: soft, nontender, nondistended, + BS MS: no deformity or atrophy  Skin: warm and dry Neuro:  Strength and sensation are intact Psych: euthymic mood, full affect  EKG:  EKG is ordered today. Personal review of the ekg ordered shows sinus rhythm.  Recent Labs: 04/04/2022: ALT 18; BUN 18; Creat 0.72; Hemoglobin 15.5; Platelets 182; Potassium 4.4; Sodium 138    Lipid Panel     Component Value Date/Time   CHOL 177 04/04/2022 0000  TRIG 71 04/04/2022 0000   HDL 74 04/04/2022 0000   CHOLHDL 2.4 04/04/2022 0000   VLDL 25.0 09/25/2019 0920   LDLCALC 87 04/04/2022 0000   LDLDIRECT 149 (H) 12/06/2011 1702     Wt Readings from Last 3 Encounters:  07/28/22 224 lb (101.6 kg)  07/26/22 224 lb (101.6 kg)  06/15/22 222 lb (100.7 kg)      Other studies Reviewed: Additional studies/ records that were reviewed today include: Apple watch rhythm strips showing intermittent episodes of atrial fibrillation.    ASSESSMENT AND PLAN:  1.  Paroxysmal atrial fibrillation: CHA2DS2-VASc of 2.  Currently is anticoagulated. 2. Obesity 3. OSA - compliant with CPAP 4. Type 2 DM - compliant with medication; last A1C 6.5%  Current medicines are reviewed at length with the patient today.   The patient has concerns regarding her medicines.  The following changes were made today:  transition from lopressor to toprol.  - Discontinue Eliquis due to low risk of stroke.  - Transition from lopressor to Toprol 100 mg qhs.  - Referral to [facility]. - Plan to order baseline  echocardiogram due to new onset PAF. - Consider flecainide for AAD therapy during phone call for echo results. - F/U 3 months.   Labs/ tests ordered today include:  Orders Placed This Encounter  Procedures   Amb Ref to [facility]   EKG 12-Lead   ECHOCARDIOGRAM COMPLETE    Disposition:   FU with Sanav Remer 3 months  Signed, Odeth Bry Meredith Leeds, MD  07/28/2022 11:00 AM     [facility] [address] [place] [address] [phone] (office) [phone] (fax)  I have seen and examined this patient with Willa Frater.  Agree with above, note added to reflect my findings.  Presents to clinic today with complaints of atrial fibrillation.  She has a sleep apnea and diabetes.  She has been having episodes of atrial fibrillation that lasts up to an hour and a half.  She feels palpitations and fatigue.  These episodes occur every few days.  She feels that they occur when she is stressed, has caffeine or is dehydrated.  GEN: Well nourished, well developed, in no acute distress  HEENT: normal  Neck: no JVD, carotid bruits, or masses Cardiac: RRR; no murmurs, rubs, or gallops,no edema  Respiratory:  clear to auscultation bilaterally, normal work of breathing GI: soft, nontender, nondistended, + BS MS: no deformity or atrophy  Skin: warm and dry Neuro:  Strength and sensation are intact Psych: euthymic mood, full affect   Paroxysmal atrial fibrillation: CHA2DS2-VASc of 1.  Due to that, we Meher Kucinski stop her Eliquis.  She is continue to have episodes of atrial fibrillation making her feel quite poorly.  She brings in strips of rapid heart rates.  Riggs Dineen change to Toprol-XL 100 mg daily.  Surena Welge plan for transthoracic echo. Obstructive sleep apnea: CPAP compliance encouraged Morbid obesity: Cynethia Schindler refer to [facility] Body mass index is 40.97 kg/m.   Duquan Gillooly M. Javarius Tsosie MD 07/28/2022 11:00 AM

## 2022-08-02 DIAGNOSIS — F4323 Adjustment disorder with mixed anxiety and depressed mood: Secondary | ICD-10-CM | POA: Diagnosis not present

## 2022-08-08 DIAGNOSIS — F4323 Adjustment disorder with mixed anxiety and depressed mood: Secondary | ICD-10-CM | POA: Diagnosis not present

## 2022-08-08 DIAGNOSIS — M25519 Pain in unspecified shoulder: Secondary | ICD-10-CM | POA: Diagnosis not present

## 2022-08-10 ENCOUNTER — Other Ambulatory Visit (HOSPITAL_COMMUNITY): Payer: Self-pay

## 2022-08-10 ENCOUNTER — Other Ambulatory Visit (HOSPITAL_BASED_OUTPATIENT_CLINIC_OR_DEPARTMENT_OTHER): Payer: Self-pay

## 2022-08-10 MED ORDER — LISDEXAMFETAMINE DIMESYLATE 60 MG PO CAPS
60.0000 mg | ORAL_CAPSULE | Freq: Every day | ORAL | 0 refills | Status: DC
  Filled 2022-08-10: qty 30, 30d supply, fill #0

## 2022-08-10 MED ORDER — AMPHETAMINE-DEXTROAMPHETAMINE 10 MG PO TABS
10.0000 mg | ORAL_TABLET | Freq: Every day | ORAL | 0 refills | Status: DC
  Filled 2022-08-10: qty 30, 30d supply, fill #0

## 2022-08-11 ENCOUNTER — Ambulatory Visit: Payer: 59 | Admitting: Family Medicine

## 2022-08-11 ENCOUNTER — Other Ambulatory Visit (HOSPITAL_BASED_OUTPATIENT_CLINIC_OR_DEPARTMENT_OTHER): Payer: Self-pay

## 2022-08-14 ENCOUNTER — Other Ambulatory Visit: Payer: Self-pay

## 2022-08-15 DIAGNOSIS — M25519 Pain in unspecified shoulder: Secondary | ICD-10-CM | POA: Diagnosis not present

## 2022-08-16 ENCOUNTER — Other Ambulatory Visit (HOSPITAL_COMMUNITY): Payer: Self-pay

## 2022-08-16 ENCOUNTER — Other Ambulatory Visit (HOSPITAL_BASED_OUTPATIENT_CLINIC_OR_DEPARTMENT_OTHER): Payer: Self-pay

## 2022-08-16 DIAGNOSIS — G4733 Obstructive sleep apnea (adult) (pediatric): Secondary | ICD-10-CM | POA: Diagnosis not present

## 2022-08-18 ENCOUNTER — Other Ambulatory Visit (HOSPITAL_COMMUNITY): Payer: Self-pay

## 2022-08-18 ENCOUNTER — Ambulatory Visit: Payer: 59 | Admitting: Family Medicine

## 2022-08-21 DIAGNOSIS — M25512 Pain in left shoulder: Secondary | ICD-10-CM | POA: Diagnosis not present

## 2022-08-23 DIAGNOSIS — M25519 Pain in unspecified shoulder: Secondary | ICD-10-CM | POA: Diagnosis not present

## 2022-08-29 ENCOUNTER — Ambulatory Visit (HOSPITAL_COMMUNITY): Payer: Commercial Managed Care - PPO | Attending: Internal Medicine

## 2022-08-29 DIAGNOSIS — I480 Paroxysmal atrial fibrillation: Secondary | ICD-10-CM | POA: Diagnosis not present

## 2022-08-29 LAB — ECHOCARDIOGRAM COMPLETE
Area-P 1/2: 3.48 cm2
S' Lateral: 2.5 cm

## 2022-08-31 DIAGNOSIS — F411 Generalized anxiety disorder: Secondary | ICD-10-CM | POA: Diagnosis not present

## 2022-09-02 DIAGNOSIS — H66001 Acute suppurative otitis media without spontaneous rupture of ear drum, right ear: Secondary | ICD-10-CM | POA: Diagnosis not present

## 2022-09-02 DIAGNOSIS — R051 Acute cough: Secondary | ICD-10-CM | POA: Diagnosis not present

## 2022-09-02 DIAGNOSIS — Z6841 Body Mass Index (BMI) 40.0 and over, adult: Secondary | ICD-10-CM | POA: Diagnosis not present

## 2022-09-06 ENCOUNTER — Ambulatory Visit: Payer: 59 | Admitting: Family Medicine

## 2022-09-07 DIAGNOSIS — M25519 Pain in unspecified shoulder: Secondary | ICD-10-CM | POA: Diagnosis not present

## 2022-09-07 NOTE — Progress Notes (Signed)
[place] Phone: [phone] Subjective:   [name], am serving as a scribe for Dr. Hulan Saas.  I'm seeing this patient by the request  of:  Luetta Nutting, DO  CC: Back and neck pain  [id number]  Tabitha [name] is a 57 y.o. female coming in with complaint of back and neck pain. OMT on 07/26/2022. Patient states that her back and her shoulder are doing ok. Doing PT and in the gym and feels she is getting stronger.   Medications patient has been prescribed: None          Reviewed prior external information including notes and imaging from previsou exam, outside providers and external EMR if available.   As well as notes that were available from care everywhere and other healthcare systems.  Past medical history, social, surgical and family history all reviewed in electronic medical record.  No pertanent information unless stated regarding to the chief complaint.   Past Medical History:  Diagnosis Date   Arthritis    Asthma    De Quervain's disease (tenosynovitis)    Dermatophytosis, scalp    Diabetes mellitus    Hypercholesteremia    Obesity    Polycystic ovarian disease    Psoriatic arthritis (HCC)    Wears glasses     Allergies  Allergen Reactions   Chocolate Anaphylaxis   Food     chocolate   Statins     Intolerant  lft elevation   Latex Rash     Review of Systems:  No headache, visual changes, nausea, vomiting, diarrhea, constipation, dizziness, abdominal pain, skin rash, fevers, chills, night sweats, weight loss, swollen lymph nodes, body aches, joint swelling, chest pain, shortness of breath, mood changes. POSITIVE muscle aches but very mild   Objective  Blood pressure 134/88, pulse 93, height 5\' 2"  (1.575 m), weight 228 lb (103.4 kg), SpO2 96 %.   General: No apparent distress alert and oriented x3 mood and affect normal, dressed appropriately.  HEENT: Pupils equal,  extraocular movements intact  Respiratory: Patient's speak in full sentences and does not appear short of breath  Cardiovascular: No lower extremity edema, non tender, no erythema  Gait normal  MSK:  Back loss of lordosis with tightness with faber neg slt  No swelling of the knee at the moment.  Osteopathic findings  C2 flexed rotated and side bent right C5 flexed rotated and side bent left T3 extended rotated and side bent right inhaled rib T10 extended rotated and side bent left L2 flexed rotated and side bent right Sacrum right on right       Assessment and Plan:  Low back pain Multifactorial.  Continues to have some discomfort and pain but nothing severe.  Continue to increase activity as tolerated.  Discussed icing regimen and home exercises.  No change in medications.  Follow-up again in 6 to 8 weeks    Nonallopathic problems  Decision today to treat with OMT was based on Physical Exam  After verbal consent patient was treated with HVLA, ME, FPR techniques in cervical, rib, thoracic, lumbar, and sacral  areas  Patient tolerated the procedure well with improvement in symptoms  Patient given exercises, stretches and lifestyle modifications  See medications in patient instructions if given  Patient will follow up in 4-8 weeks     The above documentation has been reviewed and is accurate and complete Lyndal Pulley, DO  Note: This dictation was prepared with Dragon dictation along with smaller phrase technology. Any transcriptional errors that result from this process are unintentional.

## 2022-09-11 DIAGNOSIS — F411 Generalized anxiety disorder: Secondary | ICD-10-CM | POA: Diagnosis not present

## 2022-09-13 ENCOUNTER — Other Ambulatory Visit (HOSPITAL_COMMUNITY): Payer: Self-pay

## 2022-09-14 ENCOUNTER — Encounter: Payer: Self-pay | Admitting: Family Medicine

## 2022-09-14 ENCOUNTER — Ambulatory Visit: Payer: Commercial Managed Care - PPO | Admitting: Family Medicine

## 2022-09-14 VITALS — BP 134/88 | HR 93 | Ht 62.0 in | Wt 228.0 lb

## 2022-09-14 DIAGNOSIS — M9901 Segmental and somatic dysfunction of cervical region: Secondary | ICD-10-CM | POA: Diagnosis not present

## 2022-09-14 DIAGNOSIS — G8929 Other chronic pain: Secondary | ICD-10-CM | POA: Diagnosis not present

## 2022-09-14 DIAGNOSIS — M9908 Segmental and somatic dysfunction of rib cage: Secondary | ICD-10-CM

## 2022-09-14 DIAGNOSIS — M9903 Segmental and somatic dysfunction of lumbar region: Secondary | ICD-10-CM | POA: Diagnosis not present

## 2022-09-14 DIAGNOSIS — M9904 Segmental and somatic dysfunction of sacral region: Secondary | ICD-10-CM

## 2022-09-14 DIAGNOSIS — M545 Low back pain, unspecified: Secondary | ICD-10-CM

## 2022-09-14 DIAGNOSIS — M9902 Segmental and somatic dysfunction of thoracic region: Secondary | ICD-10-CM | POA: Diagnosis not present

## 2022-09-14 NOTE — Assessment & Plan Note (Signed)
Multifactorial.  Continues to have some discomfort and pain but nothing severe.  Continue to increase activity as tolerated.  Discussed icing regimen and home exercises.  No change in medications.  Follow-up again in 6 to 8 weeks

## 2022-09-14 NOTE — Patient Instructions (Signed)
Good to see you Enjoy the walk with Tabitha [name] Keep working hard See me in 7-8 weeks

## 2022-09-15 LAB — LIPID PANEL
Cholesterol: 224 — AB (ref 0–200)
HDL: 71 — AB (ref 35–70)
LDL Cholesterol: 130
Triglycerides: 130 (ref 40–160)

## 2022-09-15 LAB — VITAMIN D 25 HYDROXY (VIT D DEFICIENCY, FRACTURES): Vit D, 25-Hydroxy: 38.8

## 2022-09-15 LAB — BASIC METABOLIC PANEL
BUN: 14 (ref 4–21)
CO2: 23 — AB (ref 13–22)
Chloride: 102 (ref 99–108)
Creatinine: 0.7 (ref 0.5–1.1)
Glucose: 195
Potassium: 4.4 meq/L (ref 3.5–5.1)
Sodium: 138 (ref 137–147)

## 2022-09-15 LAB — CBC AND DIFFERENTIAL
HCT: 46 (ref 36–46)
Hemoglobin: 15.1 (ref 12.0–16.0)
Neutrophils Absolute: 3.9
Platelets: 190 10*3/uL (ref 150–400)
WBC: 6.4

## 2022-09-15 LAB — COMPREHENSIVE METABOLIC PANEL
Albumin: 4.2 (ref 3.5–5.0)
Calcium: 9.9 (ref 8.7–10.7)
Globulin: 2.6
eGFR: 101

## 2022-09-15 LAB — TESTOSTERONE: Testosterone: 17

## 2022-09-15 LAB — IRON,TIBC AND FERRITIN PANEL: Ferritin: 71

## 2022-09-15 LAB — HEMOGLOBIN A1C: Hemoglobin A1C: 7.4

## 2022-09-15 LAB — CBC: RBC: 5.02 (ref 3.87–5.11)

## 2022-09-15 LAB — TSH: TSH: 1.23 (ref 0.41–5.90)

## 2022-09-15 LAB — HEPATIC FUNCTION PANEL
ALT: 42 U/L — AB (ref 7–35)
AST: 20 (ref 13–35)
Alkaline Phosphatase: 98 (ref 25–125)
Bilirubin, Total: 0.3

## 2022-09-16 DIAGNOSIS — G4733 Obstructive sleep apnea (adult) (pediatric): Secondary | ICD-10-CM | POA: Diagnosis not present

## 2022-09-18 DIAGNOSIS — F411 Generalized anxiety disorder: Secondary | ICD-10-CM | POA: Diagnosis not present

## 2022-09-19 ENCOUNTER — Other Ambulatory Visit: Payer: Self-pay | Admitting: Family Medicine

## 2022-09-19 ENCOUNTER — Other Ambulatory Visit (HOSPITAL_BASED_OUTPATIENT_CLINIC_OR_DEPARTMENT_OTHER): Payer: Self-pay

## 2022-09-19 ENCOUNTER — Other Ambulatory Visit: Payer: Self-pay

## 2022-09-19 ENCOUNTER — Other Ambulatory Visit (HOSPITAL_COMMUNITY): Payer: Self-pay

## 2022-09-19 MED ORDER — PRAVASTATIN SODIUM 20 MG PO TABS
20.0000 mg | ORAL_TABLET | Freq: Every day | ORAL | 0 refills | Status: DC
  Filled 2022-09-19: qty 90, 90d supply, fill #0

## 2022-09-19 MED ORDER — LISDEXAMFETAMINE DIMESYLATE 60 MG PO CAPS
60.0000 mg | ORAL_CAPSULE | Freq: Every morning | ORAL | 0 refills | Status: DC
  Filled 2022-09-19: qty 30, 30d supply, fill #0

## 2022-09-19 NOTE — Telephone Encounter (Signed)
Pls contact the patient to schedule appt with Dr. Ashley Royalty per last note: Return in about 4 months (around 08/08/2022) for T2DM.   Thanks

## 2022-09-20 ENCOUNTER — Other Ambulatory Visit (HOSPITAL_BASED_OUTPATIENT_CLINIC_OR_DEPARTMENT_OTHER): Payer: Self-pay

## 2022-09-20 NOTE — Telephone Encounter (Signed)
Patient scheduled for 10/03/2022 @ 2:10 tvt

## 2022-09-25 DIAGNOSIS — F411 Generalized anxiety disorder: Secondary | ICD-10-CM | POA: Diagnosis not present

## 2022-09-27 DIAGNOSIS — M25519 Pain in unspecified shoulder: Secondary | ICD-10-CM | POA: Diagnosis not present

## 2022-10-02 DIAGNOSIS — F411 Generalized anxiety disorder: Secondary | ICD-10-CM | POA: Diagnosis not present

## 2022-10-03 ENCOUNTER — Encounter: Payer: Self-pay | Admitting: Family Medicine

## 2022-10-03 ENCOUNTER — Other Ambulatory Visit (HOSPITAL_BASED_OUTPATIENT_CLINIC_OR_DEPARTMENT_OTHER): Payer: Self-pay

## 2022-10-03 ENCOUNTER — Ambulatory Visit: Payer: Commercial Managed Care - PPO | Admitting: Family Medicine

## 2022-10-03 VITALS — BP 120/77 | HR 77 | Ht 62.0 in | Wt 231.0 lb

## 2022-10-03 DIAGNOSIS — E785 Hyperlipidemia, unspecified: Secondary | ICD-10-CM | POA: Diagnosis not present

## 2022-10-03 DIAGNOSIS — Z7984 Long term (current) use of oral hypoglycemic drugs: Secondary | ICD-10-CM | POA: Diagnosis not present

## 2022-10-03 DIAGNOSIS — R002 Palpitations: Secondary | ICD-10-CM

## 2022-10-03 DIAGNOSIS — E1169 Type 2 diabetes mellitus with other specified complication: Secondary | ICD-10-CM | POA: Diagnosis not present

## 2022-10-03 DIAGNOSIS — Z23 Encounter for immunization: Secondary | ICD-10-CM | POA: Diagnosis not present

## 2022-10-03 DIAGNOSIS — E0865 Diabetes mellitus due to underlying condition with hyperglycemia: Secondary | ICD-10-CM

## 2022-10-03 MED ORDER — PRAVASTATIN SODIUM 20 MG PO TABS
20.0000 mg | ORAL_TABLET | Freq: Every day | ORAL | 3 refills | Status: DC
  Filled 2022-10-03: qty 90, 90d supply, fill #0

## 2022-10-03 NOTE — Assessment & Plan Note (Signed)
She has been seeing cardiology.  Stopped her metoprolol as she felt that this was not making any difference for her.  Encouraged her to follow-up with her electrophysiologist and/or cardiologist.  She may try magnesium glycinate replacement for replace her current magnesium supplement.

## 2022-10-03 NOTE — Assessment & Plan Note (Signed)
LDL particles size increased on recent NMR LipoProfile.  She will restart pravastatin.

## 2022-10-03 NOTE — Patient Instructions (Addendum)
Try magnesium glycinate 250-500mg  daily.   Continue to work on dietary changes to help with blood sugars/cholesterol.

## 2022-10-03 NOTE — Assessment & Plan Note (Signed)
Diabetes control is worsened since last visit.  She will continue current medications and focus on making dietary changes.  Will plan to follow-up in 3 to 4 months.

## 2022-10-03 NOTE — Progress Notes (Unsigned)
Tabitha [name] - 57 y.o. female MRN [id number]  Date of birth: 1965/09/22  Subjective Chief Complaint  Patient presents with   Diabetes    HPI Tabitha [name] is a 57 year old female here today for follow-up visit.  Since her last visit she was seen by functional medicine specialist.  Noted to have elevation in LDL particles on NMR Fractionated lipid panel.  She has been off of statin for a couple weeks.  Additionally she has been off of metoprolol.  She is on this due to history of A-fib.  She reports that she continues to have episodes when taking metoprolol and has not felt any different since stopping.  She found the magnesium supplementation does seem to help control her symptoms better.  She has had some GI upset with her current magnesium supplement.  Condition A1c was elevated on recent labs at 7.4%.  She admits that diet and activity level to be better.  She admits that stress has been contributing to this.  She plans to work on getting back on track with her functional medicine physician and nutritionist to achieve this.  Remains on [place].  ROS:  A comprehensive ROS was completed and negative except as noted per HPI   Allergies  Allergen Reactions   Chocolate Anaphylaxis   Food     chocolate   Statins     Intolerant  lft elevation   Latex Rash   Methotrexate Rash    Past Medical History:  Diagnosis Date   Arthritis    Asthma    De Quervain's disease (tenosynovitis)    Dermatophytosis, scalp    Diabetes mellitus    Hypercholesteremia    Obesity    Polycystic ovarian disease    Psoriatic arthritis    Wears glasses     Past Surgical History:  Procedure Laterality Date   BREAST BIOPSY Right    CARPAL TUNNEL RELEASE     COLONOSCOPY     DE QUERVAIN'S RELEASE  2012   left   KNEE ARTHROPLASTY     KNEE ARTHROSCOPY Right 05/06/2013   Procedure: RIGHT KNEE ARTHROSCOPY ;  Surgeon: Velna Ochs, MD;  Location: [facility];  Service: Orthopedics;  Laterality:  Right;  partial lateral minisectomy and chondroplasty   WISDOM TOOTH EXTRACTION     WRIST SURGERY  2001   carpel tunnel -rt    Social History   Socioeconomic History   Marital status: Significant Other    Spouse name: Not on file   Number of children: Not on file   Years of education: Not on file   Highest education level: Master's degree (e.g., MA, MS, MEng, MEd, MSW, MBA)  Occupational History   Occupation: nurse    Employer: [employer]    Comment: at [employer]  Tobacco Use   Smoking status: Former    Packs/day: 1.30    Years: 12.00    Additional pack years: 0.00    Total pack years: 15.60    Types: Cigarettes    Start date: 06/12/1984    Quit date: 03/18/1995    Years since quitting: 27.5   Smokeless tobacco: Never   Tobacco comments:    Not interested in returning to smoking.   Vaping Use   Vaping Use: Never used  Substance and Sexual Activity   Alcohol use: Yes    Comment: rarely   Drug use: No   Sexual activity: Yes    Partners: Male    Comment: married  Other Topics Concern  Not on file  Social History Narrative   Not on file   Social Determinants of Health   Financial Resource Strain: Low Risk  (10/03/2022)   Overall Financial Resource Strain (CARDIA)    Difficulty of Paying Living Expenses: Not hard at all  Food Insecurity: No Food Insecurity (10/03/2022)   Hunger Vital Sign    Worried About Running Out of Food in the Last Year: Never true    Ran Out of Food in the Last Year: Never true  Transportation Needs: No Transportation Needs (10/03/2022)   PRAPARE - Administrator, Civil Service (Medical): No    Lack of Transportation (Non-Medical): No  Physical Activity: Sufficiently Active (10/03/2022)   Exercise Vital Sign    Days of Exercise per Week: 4 days    Minutes of Exercise per Session: 150+ min  Stress: Stress Concern Present (10/03/2022)   [employer] of Occupational Health - Occupational Stress Questionnaire    Feeling of  Stress : Very much  Social Connections: Unknown (10/03/2022)   Social Connection and Isolation Panel [NHANES]    Frequency of Communication with Friends and Family: More than three times a week    Frequency of Social Gatherings with Friends and Family: More than three times a week    Attends Religious Services: 1 to 4 times per year    Active Member of Clubs or Organizations: No    Attends Engineer, structural: Not on file    Marital Status: Patient declined    Family History  Problem Relation Age of Onset   Diabetes Father    Hypertension Father    Stroke Father    Kidney disease Father    Heart disease Father    Cancer Mother 25       breast ca died at age 43   Breast cancer Mother 65   Hypertension Other    Diabetes type II Other    Obesity Other    Obesity Brother    Hypertension Brother    Heart disease Maternal Grandmother    Alzheimer's disease Maternal Grandmother    Diabetes Paternal Grandmother    Heart disease Paternal Grandfather     Health Maintenance  Topic Date Due   Zoster Vaccines- Shingrix (1 of 2) Never done   Diabetic kidney evaluation - Urine ACR  11/26/2021   OPHTHALMOLOGY EXAM  10/01/2022   COLONOSCOPY (Pts 45-42yrs Insurance coverage will need to be confirmed)  01/06/2023 (Originally 12/27/2021)   Hepatitis C Screening  01/06/2023 (Originally 08/13/1982)   COVID-19 Vaccine (8 - 2023-24 season) 03/21/2023 (Originally 02/10/2022)   HEMOGLOBIN A1C  10/04/2022   FOOT EXAM  01/06/2023   INFLUENZA VACCINE  01/11/2023   Diabetic kidney evaluation - eGFR measurement  04/05/2023   MAMMOGRAM  07/12/2024   PAP SMEAR-Modifier  08/12/2025   DTaP/Tdap/Td (3 - Td or Tdap) 01/06/2032   HIV Screening  Completed   HPV VACCINES  Aged Out     ----------------------------------------------------------------------------------------------------------------------------------------------------------------------------------------------------------------- Physical  Exam BP 120/77 (BP Location: Left Arm, Patient Position: Sitting, Cuff Size: Large)   Pulse 77   Ht  (1.575 m)   Wt 231 lb (104.8 kg)   SpO2 97%   BMI 42.25 kg/m   Physical Exam Constitutional:      Appearance: Normal appearance.  HENT:     Head: Normocephalic and atraumatic.  Eyes:     General: No scleral icterus. Neurological:     Mental Status: She is alert.  Psychiatric:  Mood and Affect: Mood normal.        Behavior: Behavior normal.     ------------------------------------------------------------------------------------------------------------------------------------------------------------------------------------------------------------------- Assessment and Plan  Diabetes mellitus due to underlying condition with hyperglycemia (HCC) Diabetes control is worsened since last visit.  She will continue current medications and focus on making dietary changes.  Will plan to follow-up in 3 to 4 months.  Hyperlipidemia associated with type 2 diabetes mellitus (HCC) LDL particles size increased on recent NMR LipoProfile.  She will restart pravastatin.  Palpitations She has been seeing cardiology.  Stopped her metoprolol as she felt that this was not making any difference for her.  Encouraged her to follow-up with her electrophysiologist and/or cardiologist.  She may try magnesium glycinate replacement for replace her current magnesium supplement.   Meds ordered this encounter  Medications   pravastatin (PRAVACHOL) 20 MG tablet    Sig: Take 1 tablet (20 mg total) by mouth daily.    Dispense:  90 tablet    Refill:  3    Return in about 4 months (around 02/02/2023) for F/u T2DM.    This visit occurred during the SARS-CoV-2 public health emergency.  Safety protocols were in place, including screening questions prior to the visit, additional usage of staff PPE, and extensive cleaning of exam room while observing appropriate contact time as indicated for  disinfecting solutions.

## 2022-10-05 ENCOUNTER — Other Ambulatory Visit (HOSPITAL_BASED_OUTPATIENT_CLINIC_OR_DEPARTMENT_OTHER): Payer: Self-pay

## 2022-10-05 MED ORDER — ITRACONAZOLE 100 MG PO CAPS
200.0000 mg | ORAL_CAPSULE | Freq: Every day | ORAL | 0 refills | Status: DC
  Filled 2022-10-05: qty 14, 7d supply, fill #0
  Filled 2022-10-09: qty 30, 15d supply, fill #0

## 2022-10-06 ENCOUNTER — Other Ambulatory Visit (HOSPITAL_BASED_OUTPATIENT_CLINIC_OR_DEPARTMENT_OTHER): Payer: Self-pay

## 2022-10-06 NOTE — Telephone Encounter (Signed)
Pt came by and picked up [facility]

## 2022-10-07 ENCOUNTER — Other Ambulatory Visit (HOSPITAL_BASED_OUTPATIENT_CLINIC_OR_DEPARTMENT_OTHER): Payer: Self-pay

## 2022-10-09 ENCOUNTER — Other Ambulatory Visit (HOSPITAL_BASED_OUTPATIENT_CLINIC_OR_DEPARTMENT_OTHER): Payer: Self-pay

## 2022-10-09 DIAGNOSIS — F411 Generalized anxiety disorder: Secondary | ICD-10-CM | POA: Diagnosis not present

## 2022-10-10 ENCOUNTER — Other Ambulatory Visit (HOSPITAL_COMMUNITY): Payer: Self-pay

## 2022-10-13 ENCOUNTER — Other Ambulatory Visit (HOSPITAL_COMMUNITY): Payer: Self-pay

## 2022-10-16 DIAGNOSIS — F411 Generalized anxiety disorder: Secondary | ICD-10-CM | POA: Diagnosis not present

## 2022-10-16 DIAGNOSIS — G4733 Obstructive sleep apnea (adult) (pediatric): Secondary | ICD-10-CM | POA: Diagnosis not present

## 2022-10-18 ENCOUNTER — Other Ambulatory Visit (HOSPITAL_BASED_OUTPATIENT_CLINIC_OR_DEPARTMENT_OTHER): Payer: Self-pay

## 2022-10-18 ENCOUNTER — Inpatient Hospital Stay: Admission: RE | Admit: 2022-10-18 | Payer: 59 | Source: Ambulatory Visit

## 2022-10-18 MED ORDER — LISDEXAMFETAMINE DIMESYLATE 60 MG PO CAPS
60.0000 mg | ORAL_CAPSULE | Freq: Every day | ORAL | 0 refills | Status: DC
  Filled 2022-10-18 (×2): qty 30, 30d supply, fill #0

## 2022-10-19 ENCOUNTER — Ambulatory Visit: Payer: Commercial Managed Care - PPO | Admitting: Family Medicine

## 2022-10-23 ENCOUNTER — Encounter: Payer: Self-pay | Admitting: Family Medicine

## 2022-10-24 ENCOUNTER — Other Ambulatory Visit (HOSPITAL_BASED_OUTPATIENT_CLINIC_OR_DEPARTMENT_OTHER): Payer: Self-pay

## 2022-10-24 MED ORDER — DEXCOM G6 RECEIVER DEVI
0 refills | Status: DC
  Filled 2022-10-24: qty 1, 90d supply, fill #0

## 2022-10-24 MED ORDER — DEXCOM G6 TRANSMITTER MISC
0 refills | Status: DC
  Filled 2022-10-24: qty 1, 90d supply, fill #0

## 2022-10-24 MED ORDER — DEXCOM G6 SENSOR MISC
0 refills | Status: DC
  Filled 2022-10-24 – 2022-11-07 (×2): qty 9, 90d supply, fill #0

## 2022-10-30 ENCOUNTER — Other Ambulatory Visit (HOSPITAL_BASED_OUTPATIENT_CLINIC_OR_DEPARTMENT_OTHER): Payer: Self-pay

## 2022-10-30 DIAGNOSIS — F411 Generalized anxiety disorder: Secondary | ICD-10-CM | POA: Diagnosis not present

## 2022-10-31 ENCOUNTER — Encounter: Payer: Self-pay | Admitting: Family Medicine

## 2022-10-31 ENCOUNTER — Ambulatory Visit (INDEPENDENT_AMBULATORY_CARE_PROVIDER_SITE_OTHER): Payer: Commercial Managed Care - PPO | Admitting: Family Medicine

## 2022-10-31 ENCOUNTER — Ambulatory Visit
Admission: RE | Admit: 2022-10-31 | Discharge: 2022-10-31 | Disposition: A | Discharge status: Home / Self Care | Payer: Commercial Managed Care - PPO | Source: Ambulatory Visit | Attending: Pulmonary Disease | Admitting: Pulmonary Disease

## 2022-10-31 ENCOUNTER — Other Ambulatory Visit (HOSPITAL_BASED_OUTPATIENT_CLINIC_OR_DEPARTMENT_OTHER): Payer: Self-pay

## 2022-10-31 VITALS — BP 118/72 | HR 79 | Ht 62.0 in | Wt 224.0 lb

## 2022-10-31 DIAGNOSIS — M9903 Segmental and somatic dysfunction of lumbar region: Secondary | ICD-10-CM | POA: Diagnosis not present

## 2022-10-31 DIAGNOSIS — M9904 Segmental and somatic dysfunction of sacral region: Secondary | ICD-10-CM

## 2022-10-31 DIAGNOSIS — M9902 Segmental and somatic dysfunction of thoracic region: Secondary | ICD-10-CM

## 2022-10-31 DIAGNOSIS — R9389 Abnormal findings on diagnostic imaging of other specified body structures: Secondary | ICD-10-CM

## 2022-10-31 DIAGNOSIS — M9901 Segmental and somatic dysfunction of cervical region: Secondary | ICD-10-CM

## 2022-10-31 DIAGNOSIS — M545 Low back pain, unspecified: Secondary | ICD-10-CM | POA: Diagnosis not present

## 2022-10-31 DIAGNOSIS — I700 Atherosclerosis of aorta: Secondary | ICD-10-CM | POA: Diagnosis not present

## 2022-10-31 DIAGNOSIS — E612 Magnesium deficiency: Secondary | ICD-10-CM | POA: Diagnosis not present

## 2022-10-31 DIAGNOSIS — M9908 Segmental and somatic dysfunction of rib cage: Secondary | ICD-10-CM

## 2022-10-31 DIAGNOSIS — G8929 Other chronic pain: Secondary | ICD-10-CM | POA: Diagnosis not present

## 2022-10-31 DIAGNOSIS — R945 Abnormal results of liver function studies: Secondary | ICD-10-CM | POA: Diagnosis not present

## 2022-10-31 DIAGNOSIS — R911 Solitary pulmonary nodule: Secondary | ICD-10-CM | POA: Diagnosis not present

## 2022-10-31 DIAGNOSIS — R7301 Impaired fasting glucose: Secondary | ICD-10-CM | POA: Diagnosis not present

## 2022-10-31 NOTE — Patient Instructions (Signed)
Good to see you Thanks for letting me vent  See me in 6-8 weeks

## 2022-10-31 NOTE — Progress Notes (Signed)
[name] [address] [place] [address] Phone: [phone] Subjective:   [name], am serving as a scribe for Dr. Antoine Primas.  I'm seeing this patient by the request  of:  Everrett Coombe, DO  CC: Back and neck pain  VHQ:[id number]  Tabitha [name] is a 57 y.o. female coming in with complaint of back and neck pain. OMT 09/14/2022. Patient states that her neck has improved since last visit. Using naltrexone which is helping.   Medications patient has been prescribed:   Taking:         Reviewed prior external information including notes and imaging from previsou exam, outside providers and external EMR if available.   As well as notes that were available from care everywhere and other healthcare systems.  Past medical history, social, surgical and family history all reviewed in electronic medical record.  No pertanent information unless stated regarding to the chief complaint.   Past Medical History:  Diagnosis Date   Arthritis    Asthma    De Quervain's disease (tenosynovitis)    Dermatophytosis, scalp    Diabetes mellitus    Hypercholesteremia    Obesity    Polycystic ovarian disease    Psoriatic arthritis (HCC)    Wears glasses     Allergies  Allergen Reactions   Chocolate Anaphylaxis   Food     chocolate   Statins     Intolerant  lft elevation   Latex Rash   Methotrexate Rash     Review of Systems:  No headache, visual changes, nausea, vomiting, diarrhea, constipation, dizziness, abdominal pain, skin rash, fevers, chills, night sweats, weight loss, swollen lymph nodes, body aches, joint swelling, chest pain, shortness of breath, mood changes. POSITIVE muscle aches  Objective  Blood pressure 118/72, pulse 79, height 5\' 2"  (1.575 m), weight 224 lb (101.6 kg), SpO2 98 %.   General: No apparent distress alert and oriented x3 mood and affect normal, dressed appropriately.  HEENT: Pupils equal, extraocular  movements intact  Respiratory: Patient's speak in full sentences and does not appear short of breath  Cardiovascular: No lower extremity edema, non tender, no erythema  Back exam does have some loss of lordosis noted.  Patient still has some increasing in tightness of the hip flexors bilaterally.  Does have some limited sidebending of the neck bilaterally.  Osteopathic findings  C6 flexed rotated and side bent left T3 extended rotated and side bent right inhaled rib L1 flexed rotated and side bent right Sacrum right on right       Assessment and Plan:  Low back pain Low back does have some loss of lordosis no some tenderness to palpation noted.  Tightness noted bilaterally.  Discussed which home exercises to do.  Increase activity slowly.  Follow-up again 6 to 8 weeks.  No medication changes.  Doing the naltrexone.    Nonallopathic problems  Decision today to treat with OMT was based on Physical Exam  After verbal consent patient was treated with HVLA, ME, FPR techniques in cervical, rib, thoracic, lumbar, and sacral  areas  Patient tolerated the procedure well with improvement in symptoms  Patient given exercises, stretches and lifestyle modifications  See medications in patient instructions if given  Patient will follow up in 4-8 weeks     The above documentation has been reviewed and is accurate and complete [name], DO         Note: This dictation was prepared with  Dragon dictation along with smaller Lobbyist. Any transcriptional errors that result from this process are unintentional.

## 2022-10-31 NOTE — Assessment & Plan Note (Addendum)
Low back does have some loss of lordosis no some tenderness to palpation noted.  Tightness noted bilaterally.  Discussed which home exercises to do.  Increase activity slowly.  Follow-up again 6 to 8 weeks.  No medication changes.  Doing the naltrexone.

## 2022-11-02 ENCOUNTER — Telehealth: Payer: Self-pay

## 2022-11-02 ENCOUNTER — Other Ambulatory Visit (HOSPITAL_BASED_OUTPATIENT_CLINIC_OR_DEPARTMENT_OTHER): Payer: Self-pay

## 2022-11-02 NOTE — Telephone Encounter (Addendum)
Initiated Prior authorization EXB:MWUXLK g6 products Via: Covermymeds Case/Key:[id number] Status: approved  as of 5/23 Reason:Authorization Expiration Date: 11/04/2023  Notified Pt via: Mychart

## 2022-11-03 ENCOUNTER — Other Ambulatory Visit (HOSPITAL_BASED_OUTPATIENT_CLINIC_OR_DEPARTMENT_OTHER): Payer: Self-pay

## 2022-11-06 DIAGNOSIS — F411 Generalized anxiety disorder: Secondary | ICD-10-CM | POA: Diagnosis not present

## 2022-11-07 ENCOUNTER — Other Ambulatory Visit (HOSPITAL_BASED_OUTPATIENT_CLINIC_OR_DEPARTMENT_OTHER): Payer: Self-pay

## 2022-11-08 ENCOUNTER — Ambulatory Visit: Payer: Commercial Managed Care - PPO | Attending: Cardiology | Admitting: Cardiology

## 2022-11-08 ENCOUNTER — Encounter: Payer: Self-pay | Admitting: Cardiology

## 2022-11-08 ENCOUNTER — Other Ambulatory Visit (HOSPITAL_COMMUNITY): Payer: Self-pay

## 2022-11-08 VITALS — BP 128/74 | HR 70 | Ht 62.0 in | Wt 227.0 lb

## 2022-11-08 DIAGNOSIS — I480 Paroxysmal atrial fibrillation: Secondary | ICD-10-CM | POA: Diagnosis not present

## 2022-11-08 DIAGNOSIS — G4733 Obstructive sleep apnea (adult) (pediatric): Secondary | ICD-10-CM | POA: Diagnosis not present

## 2022-11-08 NOTE — Patient Instructions (Signed)
Medication Instructions:  Your physician recommends that you continue on your current medications as directed. Please refer to the Current Medication list given to you today.  *If you need a refill on your cardiac medications before your next appointment, please call your pharmacy*   Lab Work: None ordered   Testing/Procedures: None ordered   Follow-Up: At [facility], you and your health needs are our priority.  As part of our continuing mission to provide you with exceptional heart care, we have created designated Provider Care Teams.  These Care Teams include your primary Cardiologist (physician) and Advanced Practice Providers (APPs -  Physician Assistants and Nurse Practitioners) who all work together to provide you with the care you need, when you need it.  Your next appointment:   6 month(s)  The format for your next appointment:   In Person  Provider:   You will follow up in the Atrial Fibrillation Clinic located at [facility]. Your provider will be: Clint R. Fenton, PA-C{o Or Landry Mellow, PA-C  Thank you for choosing [facility]!!   Dory Horn, RN [phone]

## 2022-11-08 NOTE — Progress Notes (Signed)
Electrophysiology Office Note   Date:  11/08/2022   ID:  Tabitha [name], DOB September 03, 1965, MRN [id number]  PCP:  Everrett Coombe, DO  Cardiologist:  Eldridge Dace Primary Electrophysiologist:  Floria Brandau Jorja Loa, MD    Chief Complaint: palpitations   History of Present Illness: Tabitha [name] is a 57 y.o. female who is being seen today for the evaluation of palpitations at the request of Everrett Coombe, DO. Presenting today for electrophysiology evaluation.  She has a history significant for ADHD, diabetes, obesity, OSA, atrial fibrillation.  She has been having palpitations for several months.  She was found to have atrial fibrillation.  She has fluttering and dizziness when she is in atrial fibrillation.  Today, denies symptoms of palpitations, chest pain, shortness of breath, orthopnea, PND, lower extremity edema, claudication, dizziness, presyncope, syncope, bleeding, or neurologic sequela. The patient is tolerating medications without difficulties.  She currently feels well.  She has no chest pain or shortness of breath.  She is able to do all of her daily activities without restriction.  She has continued to have short episodes of atrial fibrillation.  She has had been taking magnesium in the evenings.  She states that this has helped with less episodes.  She has had 2 episodes in the last few months that have lasted 20 minutes.    Past Medical History:  Diagnosis Date   Arthritis    Asthma    De Quervain's disease (tenosynovitis)    Dermatophytosis, scalp    Diabetes mellitus    Hypercholesteremia    Obesity    Polycystic ovarian disease    Psoriatic arthritis (HCC)    Wears glasses    Past Surgical History:  Procedure Laterality Date   BREAST BIOPSY Right    CARPAL TUNNEL RELEASE     COLONOSCOPY     DE QUERVAIN'S RELEASE  2012   left   KNEE ARTHROPLASTY     KNEE ARTHROSCOPY Right 05/06/2013   Procedure: RIGHT KNEE ARTHROSCOPY ;  Surgeon: Velna Ochs, MD;  Location:  [facility];  Service: Orthopedics;  Laterality: Right;  partial lateral minisectomy and chondroplasty   WISDOM TOOTH EXTRACTION     WRIST SURGERY  2001   carpel tunnel -rt     Current Outpatient Medications  Medication Sig Dispense Refill   ACCU-CHEK GUIDE test strip   3   Acetaminophen (TYLENOL PO) Take by mouth as needed.     amphetamine-dextroamphetamine (ADDERALL) 10 MG tablet Take 1 tablet by mouth daily as directed 30 tablet 0   cholecalciferol (VITAMIN D3) 25 MCG (1000 UT) tablet Take 1,000 Units by mouth daily.     clobetasol (TEMOVATE) 0.05 % external solution Apply topically 2 (two) times daily (Patient taking differently: Apply topically as needed.) 50 mL 5   clotrimazole-betamethasone (LOTRISONE) cream Apply to affected area(s) on skin 2 (two) times daily. 30 g 0   Continuous Glucose Receiver (DEXCOM G6 RECEIVER) DEVI Use to monitor glucose continuously. 1 each 0   Continuous Glucose Sensor (DEXCOM G6 SENSOR) MISC Use to monitor glucose continuously.  Change every 10 days. 12 each 0   Continuous Glucose Transmitter (DEXCOM G6 TRANSMITTER) MISC Use to monitor glucose continuously.  Change every 90 days. 2 each 0   desonide (DESOWEN) 0.05 % cream Apply 1 application topically 2 (two) times daily. 60 g 5   Empagliflozin-metFORMIN HCl ER (SYNJARDY XR) 03-999 MG TB24 Take 1 tablet by mouth daily with breakfast. 90 tablet 4   fluticasone (FLONASE)  50 MCG/ACT nasal spray Place 2 sprays into both nostrils at bedtime. 16 g 11   glucose blood (FREESTYLE LITE) test strip Use to check blood sugar 3 times daily 300 strip 3   glucose blood (FREESTYLE LITE) test strip Use to check blood sugar 3 times a day 300 strip 3   Ixekizumab (TALTZ) 80 MG/ML SOAJ Inject 80 mg subcutaneously every 28 (twenty-eight) days 1 mL 5   lisdexamfetamine (VYVANSE) 60 MG capsule Take 1 capsule (60 mg total) by mouth daily. (Patient taking differently: Take 60 mg by mouth as needed.) 30 capsule 0    Naltrexone HCl, Pain, 4.5 MG CAPS Take by mouth.     Omega-3 Fatty Acids (FISH OIL) 1200 MG CAPS Take 1-2 capsules (1,200-2,400 mg total) by mouth 2 (two) times daily. Takes 2400 mg qam and 1200 mg qpm     pravastatin (PRAVACHOL) 20 MG tablet Take 1 tablet (20 mg total) by mouth daily. 90 tablet 3   Sod Picosulfate-Mag Ox-Cit Acd (CLENPIQ) 10-3.5-12 MG-GM -GM/175ML SOLN use as directed per office (and NOT per instructions on packaging) 350 mL 0   TURMERIC PO Take by mouth 2 (two) times daily.      metoprolol succinate (TOPROL-XL) 100 MG 24 hr tablet Take 1 tablet (100 mg total) by mouth daily. Take with or immediately following a meal. 30 tablet 3   No current facility-administered medications for this visit.    Allergies:   Chocolate, Food, Statins, Latex, and Methotrexate   Social History:  The patient  reports that she quit smoking about 27 years ago. Her smoking use included cigarettes. She started smoking about 38 years ago. She has a 15.60 pack-year smoking history. She has never used smokeless tobacco. She reports current alcohol use. She reports that she does not use drugs.   Family History:  The patient's family history includes Alzheimer's disease in her maternal grandmother; Breast cancer (age of onset: 5) in her mother; Cancer (age of onset: 58) in her mother; Diabetes in her father and paternal grandmother; Diabetes type II in an other family member; Heart disease in her father, maternal grandmother, and paternal grandfather; Hypertension in her brother, father, and another family member; Kidney disease in her father; Obesity in her brother and another family member; Stroke in her father.   ROS:  Please see the history of present illness.   Otherwise, review of systems is positive for none.   All other systems are reviewed and negative.   PHYSICAL EXAM: VS:  BP 128/74   Pulse 70   Ht 5\' 2"  (1.575 m)   Wt 227 lb (103 kg)   SpO2 97%   BMI 41.52 kg/m  , BMI Body mass index is  41.52 kg/m. GEN: Well nourished, well developed, in no acute distress  HEENT: normal  Neck: no JVD, carotid bruits, or masses Cardiac: RRR; no murmurs, rubs, or gallops,no edema  Respiratory:  clear to auscultation bilaterally, normal work of breathing GI: soft, nontender, nondistended, + BS MS: no deformity or atrophy  Skin: warm and dry Neuro:  Strength and sensation are intact Psych: euthymic mood, full affect  EKG:  EKG is ordered today. Personal review of the ekg ordered shows sinus rhythm   Recent Labs: 04/04/2022: ALT 18; BUN 18; Creat 0.72; Hemoglobin 15.5; Platelets 182; Potassium 4.4; Sodium 138    Lipid Panel     Component Value Date/Time   CHOL 177 04/04/2022 0000   TRIG 71 04/04/2022 0000   HDL 74 04/04/2022  0000   CHOLHDL 2.4 04/04/2022 0000   VLDL 25.0 09/25/2019 0920   LDLCALC 87 04/04/2022 0000   LDLDIRECT 149 (H) 12/06/2011 1702     Wt Readings from Last 3 Encounters:  11/08/22 227 lb (103 kg)  10/31/22 224 lb (101.6 kg)  10/03/22 231 lb (104.8 kg)      Other studies Reviewed: Additional studies/ records that were reviewed today include: TTE 08/29/22  1. Left ventricular ejection fraction by 3D volume is 62 %. The left  ventricle has normal function. The left ventricle has no regional wall  motion abnormalities. Left ventricular diastolic parameters are consistent  with Grade I diastolic dysfunction  (impaired relaxation).   2. Right ventricular systolic function is normal. The right ventricular  size is normal. Tricuspid regurgitation signal is inadequate for assessing  PA pressure.   3. The mitral valve is normal in structure. Trivial mitral valve  regurgitation. No evidence of mitral stenosis.   4. The aortic valve is tricuspid. Aortic valve regurgitation is not  visualized. No aortic stenosis is present.   5. The inferior vena cava is normal in size with <50% respiratory  variability, suggesting right atrial pressure of 8 mmHg.     ASSESSMENT AND PLAN:  1.  Paroxysmal atrial fibrillation: CHA2DS2-VASc of 2.  Low stroke risk and thus not anticoagulated.  She is having minimal episodes of atrial fibrillation.  She is happy with her control.  Cheng Dec continue with current management.  If she does have more frequent episodes, ablation versus antiarrhythmics would be reasonable options.  She did not tolerate her metoprolol.  2.  Obesity: Lifestyle modification with diet and exercise encouraged Body mass index is 41.52 kg/m.  3.  Obstructive sleep apnea: CPAP compliance encouraged  Labs/ tests ordered today include:  Orders Placed This Encounter  Procedures   EKG 12-Lead    Disposition:   FU 6 months  Signed, Ahlana Slaydon Jorja Loa, MD  11/08/2022 8:56 AM     [facility] [address] [place] [address] [phone] (office) [phone] (fax)

## 2022-11-13 DIAGNOSIS — F411 Generalized anxiety disorder: Secondary | ICD-10-CM | POA: Diagnosis not present

## 2022-11-14 DIAGNOSIS — L4050 Arthropathic psoriasis, unspecified: Secondary | ICD-10-CM | POA: Diagnosis not present

## 2022-11-14 DIAGNOSIS — M159 Polyosteoarthritis, unspecified: Secondary | ICD-10-CM | POA: Diagnosis not present

## 2022-11-14 DIAGNOSIS — Z79899 Other long term (current) drug therapy: Secondary | ICD-10-CM | POA: Diagnosis not present

## 2022-11-15 ENCOUNTER — Other Ambulatory Visit (HOSPITAL_BASED_OUTPATIENT_CLINIC_OR_DEPARTMENT_OTHER): Payer: Self-pay

## 2022-11-15 ENCOUNTER — Ambulatory Visit: Payer: Commercial Managed Care - PPO | Attending: Family Medicine | Admitting: Pharmacist

## 2022-11-15 DIAGNOSIS — Z79899 Other long term (current) drug therapy: Secondary | ICD-10-CM

## 2022-11-15 MED ORDER — LISDEXAMFETAMINE DIMESYLATE 60 MG PO CAPS
60.0000 mg | ORAL_CAPSULE | Freq: Every day | ORAL | 0 refills | Status: DC
  Filled 2022-11-15: qty 2, 2d supply, fill #0
  Filled 2022-12-06: qty 30, 30d supply, fill #0

## 2022-11-15 NOTE — Progress Notes (Signed)
   S: Patient presents today for review of their specialty medication.   Patient is taking Taltz for psoriatic arthritis. Patient is managed by Dr. Allena Katz  for this.   Dosing: Psoriatic arthritis: SubQ: 160 mg once, followed by 80 mg every 4 weeks; may administer alone or in combination with conventional disease-modifying antirheumatic drugs (eg, methotrexate). Note: For psoriatic arthritis patients with coexisting moderate to severe plaque psoriasis, use the dosing regimen for plaque psoriasis.  Adherence: confirmed  Efficacy: states that it works okay for her.   Monitoring:  S/sx infection: none Injection site reactions: some erythema/bruising that resolves a couple of days post injection. S/sx of IBD: none S/sx of hypersensitivity reaction: none  Current adverse effects: none    O:     Lab Results  Component Value Date   WBC 7.9 04/04/2022   HGB 15.5 04/04/2022   HCT 46.2 (H) 04/04/2022   MCV 91.1 04/04/2022   PLT 182 04/04/2022      Chemistry      Component Value Date/Time   NA 138 04/04/2022 0000   K 4.4 04/04/2022 0000   CL 103 04/04/2022 0000   CO2 28 04/04/2022 0000   BUN 18 04/04/2022 0000   CREATININE 0.72 04/04/2022 0000      Component Value Date/Time   CALCIUM 9.9 04/04/2022 0000   ALKPHOS 71 04/05/2017 0817   AST 12 04/04/2022 0000   ALT 18 04/04/2022 0000   BILITOT 0.5 04/04/2022 0000       A/P: 1. Medication review: patient is taking Taltz for psoriatic arthritis. Reviewed the medication with the patient, including the following: Altamease Oiler is a medication used to treat ankylosing spondylitis, plaque psoriasis, and psoriatic arthritis. Subcutaneous: Allow to reach room temperature prior to injection (30 minutes). Do not shake. Inject full amount into the upper arms, thighs or any quadrant of the abdomen; administer each injection at a different anatomic location than a previous injection and avoid areas where the skin is tender, bruised, erythematous,  indurated, or affected by psoriasis. Administration in the upper, outer arm may be performed by a caregiver or health care provider. Ixekizumab is intended for use under the guidance and supervision of a physician; may be self-injected by the patient following proper training in SubQ injection technique. Possible adverse effects include neutropenia, antibody development, increased risk of infection, injection site reaction, hypersensitivity reactions, and inflammatory bowel disease. Avoid live vaccinations. No recommendations for any changes at this time.  Butch Penny, PharmD, Patsy [place], CPP Clinical Pharmacist [facility] [phone]

## 2022-11-22 ENCOUNTER — Other Ambulatory Visit (HOSPITAL_BASED_OUTPATIENT_CLINIC_OR_DEPARTMENT_OTHER): Payer: Self-pay

## 2022-11-27 DIAGNOSIS — F411 Generalized anxiety disorder: Secondary | ICD-10-CM | POA: Diagnosis not present

## 2022-12-04 DIAGNOSIS — F411 Generalized anxiety disorder: Secondary | ICD-10-CM | POA: Diagnosis not present

## 2022-12-06 ENCOUNTER — Other Ambulatory Visit (HOSPITAL_BASED_OUTPATIENT_CLINIC_OR_DEPARTMENT_OTHER): Payer: Self-pay

## 2022-12-06 ENCOUNTER — Other Ambulatory Visit (HOSPITAL_COMMUNITY): Payer: Self-pay

## 2022-12-06 DIAGNOSIS — F902 Attention-deficit hyperactivity disorder, combined type: Secondary | ICD-10-CM | POA: Diagnosis not present

## 2022-12-06 DIAGNOSIS — Z79899 Other long term (current) drug therapy: Secondary | ICD-10-CM | POA: Diagnosis not present

## 2022-12-06 NOTE — Progress Notes (Deleted)
  [name] [address] [place] [address] Phone: [phone] Subjective:    I'm seeing this patient by the request  of:  Everrett Coombe, DO  CC:   [id number]  Tabitha [name] is a 57 y.o. female coming in with complaint of back and neck pain. OMT on 10/31/2022. Patient states   Medications patient has been prescribed:   Taking:         Reviewed prior external information including notes and imaging from previsou exam, outside providers and external EMR if available.   As well as notes that were available from care everywhere and other healthcare systems.  Past medical history, social, surgical and family history all reviewed in electronic medical record.  No pertanent information unless stated regarding to the chief complaint.   Past Medical History:  Diagnosis Date   Arthritis    Asthma    De Quervain's disease (tenosynovitis)    Dermatophytosis, scalp    Diabetes mellitus    Hypercholesteremia    Obesity    Polycystic ovarian disease    Psoriatic arthritis (HCC)    Wears glasses     Allergies  Allergen Reactions   Chocolate Anaphylaxis   Food     chocolate   Statins     Intolerant  lft elevation   Latex Rash   Methotrexate Rash     Review of Systems:  No headache, visual changes, nausea, vomiting, diarrhea, constipation, dizziness, abdominal pain, skin rash, fevers, chills, night sweats, weight loss, swollen lymph nodes, body aches, joint swelling, chest pain, shortness of breath, mood changes. POSITIVE muscle aches  Objective  There were no vitals taken for this visit.   General: No apparent distress alert and oriented x3 mood and affect normal, dressed appropriately.  HEENT: Pupils equal, extraocular movements intact  Respiratory: Patient's speak in full sentences and does not appear short of breath  Cardiovascular: No lower extremity edema, non tender, no erythema  Gait MSK:  Back   Osteopathic  findings  C2 flexed rotated and side bent right C6 flexed rotated and side bent left T3 extended rotated and side bent right inhaled rib T9 extended rotated and side bent left L2 flexed rotated and side bent right Sacrum right on right       Assessment and Plan:  No problem-specific Assessment & Plan notes found for this encounter.    Nonallopathic problems  Decision today to treat with OMT was based on Physical Exam  After verbal consent patient was treated with HVLA, ME, FPR techniques in cervical, rib, thoracic, lumbar, and sacral  areas  Patient tolerated the procedure well with improvement in symptoms  Patient given exercises, stretches and lifestyle modifications  See medications in patient instructions if given  Patient will follow up in 4-8 weeks             Note: This dictation was prepared with Dragon dictation along with smaller phrase technology. Any transcriptional errors that result from this process are unintentional.

## 2022-12-08 ENCOUNTER — Other Ambulatory Visit (HOSPITAL_COMMUNITY): Payer: Self-pay

## 2022-12-11 ENCOUNTER — Ambulatory Visit: Payer: Commercial Managed Care - PPO | Admitting: Family Medicine

## 2022-12-11 DIAGNOSIS — F411 Generalized anxiety disorder: Secondary | ICD-10-CM | POA: Diagnosis not present

## 2022-12-13 DIAGNOSIS — M25519 Pain in unspecified shoulder: Secondary | ICD-10-CM | POA: Diagnosis not present

## 2022-12-18 DIAGNOSIS — F411 Generalized anxiety disorder: Secondary | ICD-10-CM | POA: Diagnosis not present

## 2022-12-19 LAB — LIPID PANEL
Cholesterol: 189 (ref 0–200)
HDL: 39 (ref 35–70)
Triglycerides: 198 — AB (ref 40–160)

## 2022-12-19 LAB — HEMOGLOBIN A1C
EGFR: 104
Hemoglobin A1C: 6.2

## 2022-12-25 DIAGNOSIS — M25519 Pain in unspecified shoulder: Secondary | ICD-10-CM | POA: Diagnosis not present

## 2022-12-25 DIAGNOSIS — F411 Generalized anxiety disorder: Secondary | ICD-10-CM | POA: Diagnosis not present

## 2022-12-27 DIAGNOSIS — G4733 Obstructive sleep apnea (adult) (pediatric): Secondary | ICD-10-CM | POA: Diagnosis not present

## 2023-01-01 DIAGNOSIS — F411 Generalized anxiety disorder: Secondary | ICD-10-CM | POA: Diagnosis not present

## 2023-01-02 ENCOUNTER — Other Ambulatory Visit (HOSPITAL_COMMUNITY): Payer: Self-pay

## 2023-01-05 ENCOUNTER — Other Ambulatory Visit (HOSPITAL_BASED_OUTPATIENT_CLINIC_OR_DEPARTMENT_OTHER): Payer: Self-pay

## 2023-01-05 ENCOUNTER — Other Ambulatory Visit (HOSPITAL_COMMUNITY): Payer: Self-pay

## 2023-01-05 ENCOUNTER — Other Ambulatory Visit: Payer: Self-pay | Admitting: Pharmacist

## 2023-01-05 ENCOUNTER — Other Ambulatory Visit: Payer: Self-pay

## 2023-01-05 MED ORDER — TALTZ 80 MG/ML ~~LOC~~ SOAJ
SUBCUTANEOUS | 5 refills | Status: DC
  Filled 2023-01-05: qty 1, 28d supply, fill #0
  Filled 2023-01-31: qty 1, 28d supply, fill #1
  Filled 2023-02-26: qty 1, 28d supply, fill #2
  Filled 2023-03-21: qty 1, 28d supply, fill #3
  Filled 2023-04-18: qty 1, 28d supply, fill #4

## 2023-01-05 MED ORDER — LISDEXAMFETAMINE DIMESYLATE 60 MG PO CAPS
60.0000 mg | ORAL_CAPSULE | Freq: Every day | ORAL | 0 refills | Status: DC
  Filled 2023-01-05: qty 30, 30d supply, fill #0

## 2023-01-05 MED ORDER — TALTZ 80 MG/ML ~~LOC~~ SOAJ: SUBCUTANEOUS | 5 refills | Status: DC

## 2023-01-08 ENCOUNTER — Other Ambulatory Visit (HOSPITAL_BASED_OUTPATIENT_CLINIC_OR_DEPARTMENT_OTHER): Payer: Self-pay

## 2023-01-08 ENCOUNTER — Other Ambulatory Visit (HOSPITAL_COMMUNITY): Payer: Self-pay

## 2023-01-08 ENCOUNTER — Other Ambulatory Visit: Payer: Self-pay

## 2023-01-08 MED ORDER — ITRACONAZOLE 100 MG PO CAPS
200.0000 mg | ORAL_CAPSULE | Freq: Every day | ORAL | 0 refills | Status: DC
  Filled 2023-01-08: qty 44, 22d supply, fill #0

## 2023-01-09 ENCOUNTER — Other Ambulatory Visit (HOSPITAL_BASED_OUTPATIENT_CLINIC_OR_DEPARTMENT_OTHER): Payer: Self-pay

## 2023-01-09 NOTE — Progress Notes (Unsigned)
[name] [address] [place] [address] Phone: [phone] Subjective:   [name], am serving as a scribe for Dr. Antoine Primas.  I'm seeing this patient by the request  of:  Everrett Coombe, DO  CC:   [id number]  Tabitha [name] is a 57 y.o. female coming in with complaint of back and neck pain. OMT 10/31/2022. Patient states that her L side of back and R knee have been painful. Lateral hip stretching helps knee pain.   Medications patient has been prescribed: None  Taking: No         Reviewed prior external information including notes and imaging from previsou exam, outside providers and external EMR if available.   As well as notes that were available from care everywhere and other healthcare systems.  Past medical history, social, surgical and family history all reviewed in electronic medical record.  No pertanent information unless stated regarding to the chief complaint.   Past Medical History:  Diagnosis Date   Arthritis    Asthma    De Quervain's disease (tenosynovitis)    Dermatophytosis, scalp    Diabetes mellitus    Hypercholesteremia    Obesity    Polycystic ovarian disease    Psoriatic arthritis (HCC)    Wears glasses     Allergies  Allergen Reactions   Chocolate Anaphylaxis   Food     chocolate   Statins     Intolerant  lft elevation   Latex Rash   Methotrexate Rash     Review of Systems:  No headache, visual changes, nausea, vomiting, diarrhea, constipation, dizziness, abdominal pain, skin rash, fevers, chills, night sweats, weight loss, swollen lymph nodes, body aches, joint swelling, chest pain, shortness of breath, mood changes. POSITIVE muscle aches  Objective  Blood pressure 116/68, height 5\' 2"  (1.575 m), weight 223 lb (101.2 kg).   General: No apparent distress alert and oriented x3 mood and affect normal, dressed appropriately.  HEENT: Pupils equal, extraocular movements intact   Respiratory: Patient's speak in full sentences and does not appear short of breath  Cardiovascular: No lower extremity edema, non tender, no erythema  Low back does have loss lordosis noted.  No tenderness to palpation noted.  Tightness with Pearlean Brownie right greater than left.  Osteopathic findings  C2 flexed rotated and side bent right C6 flexed rotated and side bent left T4 extended rotated and side bent right inhaled rib T9 extended rotated and side bent left L1 flexed rotated and side bent right L4 flexed rotated and side bent right Sacrum right on right       Assessment and Plan:  Low back pain Chronic problem with mild exacerbation.  Will be traveling.  Does have muscle relaxers for breakthrough.  Patient will continue to monitor weight.  Is doing somewhat better on this previously but will continue to bring down BMI less than 40.  Discussed with patient about proper shoes from time to time.  Worsening pain in the knees  we can consider the possibility of injections again but has been a while since we have even had to do them.    Nonallopathic problems  Decision today to treat with OMT was based on Physical Exam  After verbal consent patient was treated with HVLA, ME, FPR techniques in cervical, rib, thoracic, lumbar, and sacral  areas  Patient tolerated the procedure well with improvement in symptoms  Patient given exercises, stretches and lifestyle modifications  See medications in patient  instructions if given  Patient will follow up in 4-8 weeks     The above documentation has been reviewed and is accurate and complete [name], DO         Note: This dictation was prepared with Dragon dictation along with smaller phrase technology. Any transcriptional errors that result from this process are unintentional.

## 2023-01-10 ENCOUNTER — Encounter: Payer: Self-pay | Admitting: Family Medicine

## 2023-01-10 ENCOUNTER — Ambulatory Visit: Payer: Commercial Managed Care - PPO | Admitting: Family Medicine

## 2023-01-10 VITALS — BP 116/68 | Ht 62.0 in | Wt 223.0 lb

## 2023-01-10 DIAGNOSIS — M9904 Segmental and somatic dysfunction of sacral region: Secondary | ICD-10-CM | POA: Diagnosis not present

## 2023-01-10 DIAGNOSIS — M9902 Segmental and somatic dysfunction of thoracic region: Secondary | ICD-10-CM | POA: Diagnosis not present

## 2023-01-10 DIAGNOSIS — M9903 Segmental and somatic dysfunction of lumbar region: Secondary | ICD-10-CM | POA: Diagnosis not present

## 2023-01-10 DIAGNOSIS — G8929 Other chronic pain: Secondary | ICD-10-CM | POA: Diagnosis not present

## 2023-01-10 DIAGNOSIS — M9908 Segmental and somatic dysfunction of rib cage: Secondary | ICD-10-CM

## 2023-01-10 DIAGNOSIS — M545 Low back pain, unspecified: Secondary | ICD-10-CM

## 2023-01-10 DIAGNOSIS — M9901 Segmental and somatic dysfunction of cervical region: Secondary | ICD-10-CM

## 2023-01-10 NOTE — Patient Instructions (Signed)
Possibly check out [facility] See me in 6-8 weeks

## 2023-01-10 NOTE — Assessment & Plan Note (Signed)
Chronic problem with mild exacerbation.  Will be traveling.  Does have muscle relaxers for breakthrough.  Patient will continue to monitor weight.  Is doing somewhat better on this previously but will continue to bring down BMI less than 40.  Discussed with patient about proper shoes from time to time.  Worsening pain in the knees  we can consider the possibility of injections again but has been a while since we have even had to do them.

## 2023-01-11 ENCOUNTER — Encounter (INDEPENDENT_AMBULATORY_CARE_PROVIDER_SITE_OTHER): Payer: Self-pay

## 2023-01-22 DIAGNOSIS — F411 Generalized anxiety disorder: Secondary | ICD-10-CM | POA: Diagnosis not present

## 2023-01-27 DIAGNOSIS — G4733 Obstructive sleep apnea (adult) (pediatric): Secondary | ICD-10-CM | POA: Diagnosis not present

## 2023-01-29 DIAGNOSIS — F411 Generalized anxiety disorder: Secondary | ICD-10-CM | POA: Diagnosis not present

## 2023-01-30 ENCOUNTER — Other Ambulatory Visit (HOSPITAL_BASED_OUTPATIENT_CLINIC_OR_DEPARTMENT_OTHER): Payer: Self-pay

## 2023-01-31 ENCOUNTER — Other Ambulatory Visit: Payer: Self-pay

## 2023-02-01 ENCOUNTER — Encounter: Payer: Self-pay | Admitting: Family Medicine

## 2023-02-01 ENCOUNTER — Other Ambulatory Visit (HOSPITAL_BASED_OUTPATIENT_CLINIC_OR_DEPARTMENT_OTHER): Payer: Self-pay

## 2023-02-01 MED ORDER — LISDEXAMFETAMINE DIMESYLATE 60 MG PO CAPS
60.0000 mg | ORAL_CAPSULE | Freq: Every day | ORAL | 0 refills | Status: DC
  Filled 2023-02-02: qty 30, 30d supply, fill #0

## 2023-02-02 ENCOUNTER — Encounter: Payer: Self-pay | Admitting: Family Medicine

## 2023-02-02 ENCOUNTER — Other Ambulatory Visit (HOSPITAL_BASED_OUTPATIENT_CLINIC_OR_DEPARTMENT_OTHER): Payer: Self-pay

## 2023-02-02 ENCOUNTER — Ambulatory Visit: Payer: Commercial Managed Care - PPO | Admitting: Family Medicine

## 2023-02-02 VITALS — BP 125/78 | HR 82 | Ht 63.0 in | Wt 223.8 lb

## 2023-02-02 DIAGNOSIS — E785 Hyperlipidemia, unspecified: Secondary | ICD-10-CM | POA: Diagnosis not present

## 2023-02-02 DIAGNOSIS — Z7984 Long term (current) use of oral hypoglycemic drugs: Secondary | ICD-10-CM

## 2023-02-02 DIAGNOSIS — Z23 Encounter for immunization: Secondary | ICD-10-CM

## 2023-02-02 DIAGNOSIS — L4050 Arthropathic psoriasis, unspecified: Secondary | ICD-10-CM

## 2023-02-02 DIAGNOSIS — Z0000 Encounter for general adult medical examination without abnormal findings: Secondary | ICD-10-CM | POA: Insufficient documentation

## 2023-02-02 DIAGNOSIS — E0865 Diabetes mellitus due to underlying condition with hyperglycemia: Secondary | ICD-10-CM

## 2023-02-02 LAB — POCT UA - MICROALBUMIN
Creatinine, POC: 10 mg/dL
Microalbumin Ur, POC: 10 mg/L

## 2023-02-02 NOTE — Patient Instructions (Signed)
Preventive Care 40-58 Years Old, Female Preventive care refers to lifestyle choices and visits with your health care provider that can promote health and wellness. Preventive care visits are also called wellness exams. What can I expect for my preventive care visit? Counseling Your health care provider may ask you questions about your: Medical history, including: Past medical problems. Family medical history. Pregnancy history. Current health, including: Menstrual cycle. Method of birth control. Emotional well-being. Home life and relationship well-being. Sexual activity and sexual health. Lifestyle, including: Alcohol, nicotine or tobacco, and drug use. Access to firearms. Diet, exercise, and sleep habits. Work and work environment. Sunscreen use. Safety issues such as seatbelt and bike helmet use. Physical exam Your health care provider will check your: Height and weight. These may be used to calculate your BMI (body mass index). BMI is a measurement that tells if you are at a healthy weight. Waist circumference. This measures the distance around your waistline. This measurement also tells if you are at a healthy weight and may help predict your risk of certain diseases, such as type 2 diabetes and high blood pressure. Heart rate and blood pressure. Body temperature. Skin for abnormal spots. What immunizations do I need?  Vaccines are usually given at various ages, according to a schedule. Your health care provider will recommend vaccines for you based on your age, medical history, and lifestyle or other factors, such as travel or where you work. What tests do I need? Screening Your health care provider may recommend screening tests for certain conditions. This may include: Lipid and cholesterol levels. Diabetes screening. This is done by checking your blood sugar (glucose) after you have not eaten for a while (fasting). Pelvic exam and Pap test. Hepatitis B test. Hepatitis C  test. HIV (human immunodeficiency virus) test. STI (sexually transmitted infection) testing, if you are at risk. Lung cancer screening. Colorectal cancer screening. Mammogram. Talk with your health care provider about when you should start having regular mammograms. This may depend on whether you have a family history of breast cancer. BRCA-related cancer screening. This may be done if you have a family history of breast, ovarian, tubal, or peritoneal cancers. Bone density scan. This is done to screen for osteoporosis. Talk with your health care provider about your test results, treatment options, and if necessary, the need for more tests. Follow these instructions at home: Eating and drinking  Eat a diet that includes fresh fruits and vegetables, whole grains, lean protein, and low-fat dairy products. Take vitamin and mineral supplements as recommended by your health care provider. Do not drink alcohol if: Your health care provider tells you not to drink. You are pregnant, may be pregnant, or are planning to become pregnant. If you drink alcohol: Limit how much you have to 0-1 drink a day. Know how much alcohol is in your drink. In the [place] one drink equals one 12 oz bottle of beer (355 mL), one 5 oz glass of wine (148 mL), or one 1 oz glass of hard liquor (44 mL). Lifestyle Brush your teeth every morning and night with fluoride toothpaste. Floss one time each day. Exercise for at least 30 minutes 5 or more days each week. Do not use any products that contain nicotine or tobacco. These products include cigarettes, chewing tobacco, and vaping devices, such as e-cigarettes. If you need help quitting, ask your health care provider. Do not use drugs. If you are sexually active, practice safe sex. Use a condom or other form of protection to   prevent STIs. If you do not wish to become pregnant, use a form of birth control. If you plan to become pregnant, see your health care provider for a  prepregnancy visit. Take aspirin only as told by your health care provider. Make sure that you understand how much to take and what form to take. Work with your health care provider to find out whether it is safe and beneficial for you to take aspirin daily. Find healthy ways to manage stress, such as: Meditation, yoga, or listening to music. Journaling. Talking to a trusted person. Spending time with friends and family. Minimize exposure to UV radiation to reduce your risk of skin cancer. Safety Always wear your seat belt while driving or riding in a vehicle. Do not drive: If you have been drinking alcohol. Do not ride with someone who has been drinking. When you are tired or distracted. While texting. If you have been using any mind-altering substances or drugs. Wear a helmet and other protective equipment during sports activities. If you have firearms in your house, make sure you follow all gun safety procedures. Seek help if you have been physically or sexually abused. What's next? Visit your health care provider once a year for an annual wellness visit. Ask your health care provider how often you should have your eyes and teeth checked. Stay up to date on all vaccines. This information is not intended to replace advice given to you by your health care provider. Make sure you discuss any questions you have with your health care provider. Document Revised: 11/24/2020 Document Reviewed: 11/24/2020 Elsevier Patient Education  2024 Elsevier Inc.  

## 2023-02-02 NOTE — Assessment & Plan Note (Signed)
Stable with Taltz.  Continue management per rheumatology.

## 2023-02-02 NOTE — Assessment & Plan Note (Signed)
Well adult Orders Placed This Encounter  Procedures   Lipid panel    This external order was created through the Results Console.   Hemoglobin A1c    This external order was created through the Results Console.  Screenings: Reviewed recent labs.  Immunizations: Shingrix Anticipatory guidance/Risk factor reduction:  Recommendations per AVS.

## 2023-02-02 NOTE — Assessment & Plan Note (Signed)
Blood sugars improved on recent labs.  Continue synjardy.  Encouraged continued dietary changes.

## 2023-02-02 NOTE — Progress Notes (Unsigned)
Tabitha [name] - 57 y.o. female MRN [id number]  Date of birth: 20-Jan-1966  Subjective No chief complaint on file.   HPI Tabitha [name] is a 57 y.o. female here today for annual exam.   She reports that she is doing pretty well.  Had labs completed recently through [facility] w/Dr. Derrell Lolling.  A1c was improved from 6.5% to 6.2%  She continues on Morse.  She is tolerating this well at current strength.  She is off of pravastatin, following a plant based diet.  Her recent lipoprofile indicates that her LDL remains elevated but is improved from previous check.  Her LDL-P has reduced from 1512-->1175.   She has been following a protocol for yeast overgrowth but has not tolerated antifungals very well.  She is considering discontinuing this.   She is moderately active.  She is following more of a plant based diet at this tim.   She is a non-smoker.  Rare EtOH use.    ROS:  A comprehensive ROS was completed and negative except as noted per HPI  Allergies  Allergen Reactions   Chocolate Anaphylaxis   Food     chocolate   Statins     Intolerant  lft elevation   Latex Rash   Methotrexate Rash    Past Medical History:  Diagnosis Date   Arthritis    Asthma    De Quervain's disease (tenosynovitis)    Dermatophytosis, scalp    Diabetes mellitus    Hypercholesteremia    Obesity    Polycystic ovarian disease    Psoriatic arthritis (HCC)    Wears glasses     Past Surgical History:  Procedure Laterality Date   BREAST BIOPSY Right    CARPAL TUNNEL RELEASE     COLONOSCOPY     DE QUERVAIN'S RELEASE  2012   left   KNEE ARTHROPLASTY     KNEE ARTHROSCOPY Right 05/06/2013   Procedure: RIGHT KNEE ARTHROSCOPY ;  Surgeon: Velna Ochs, MD;  Location: [facility];  Service: Orthopedics;  Laterality: Right;  partial lateral minisectomy and chondroplasty   WISDOM TOOTH EXTRACTION     WRIST SURGERY  2001   carpel tunnel -rt    Social History   Socioeconomic  History   Marital status: Significant Other    Spouse name: Not on file   Number of children: Not on file   Years of education: Not on file   Highest education level: Master's degree (e.g., MA, MS, MEng, MEd, MSW, MBA)  Occupational History   Occupation: nurse    Employer: [employer]    Comment: at wom[facility]  Tobacco Use   Smoking status: Former    Current packs/day: 0.00    Average packs/day: 1.3 packs/day for 12.0 years (15.6 ttl pk-yrs)    Types: Cigarettes    Start date: 06/12/1984    Quit date: 03/18/1995    Years since quitting: 27.8   Smokeless tobacco: Never   Tobacco comments:    Not interested in returning to smoking.   Vaping Use   Vaping status: Never Used  Substance and Sexual Activity   Alcohol use: Yes    Comment: rarely   Drug use: No   Sexual activity: Yes    Partners: Male    Comment: married  Other Topics Concern   Not on file  Social History Narrative   Not on file   Social Determinants of Health   Financial Resource Strain: Low Risk  (10/03/2022)  Overall Financial Resource Strain (CARDIA)    Difficulty of Paying Living Expenses: Not hard at all  Food Insecurity: No Food Insecurity (10/03/2022)   Hunger Vital Sign    Worried About Running Out of Food in the Last Year: Never true    Ran Out of Food in the Last Year: Never true  Transportation Needs: No Transportation Needs (10/03/2022)   PRAPARE - Administrator, Civil Service (Medical): No    Lack of Transportation (Non-Medical): No  Physical Activity: Sufficiently Active (10/03/2022)   Exercise Vital Sign    Days of Exercise per Week: 4 days    Minutes of Exercise per Session: 150+ min  Stress: Stress Concern Present (10/03/2022)   [employer] of Occupational Health - Occupational Stress Questionnaire    Feeling of Stress : Very much  Social Connections: Unknown (10/03/2022)   Social Connection and Isolation Panel [NHANES]    Frequency of Communication with Friends and  Family: More than three times a week    Frequency of Social Gatherings with Friends and Family: More than three times a week    Attends Religious Services: 1 to 4 times per year    Active Member of [employer] or Organizations: No    Attends Engineer, structural: Not on file    Marital Status: Patient declined    Family History  Problem Relation Age of Onset   Diabetes Father    Hypertension Father    Stroke Father    Kidney disease Father    Heart disease Father    Cancer Mother 64       breast ca died at age 47   Breast cancer Mother 47   Hypertension Other    Diabetes type II Other    Obesity Other    Obesity Brother    Hypertension Brother    Heart disease Maternal Grandmother    Alzheimer's disease Maternal Grandmother    Diabetes Paternal Grandmother    Heart disease Paternal Grandfather     Health Maintenance  Topic Date Due   Hepatitis C Screening  Never done   OPHTHALMOLOGY EXAM  10/01/2022   COVID-19 Vaccine (8 - 2023-24 season) 03/21/2023 (Originally 02/10/2022)   INFLUENZA VACCINE  09/10/2023 (Originally 01/11/2023)   Diabetic kidney evaluation - eGFR measurement  04/05/2023   HEMOGLOBIN A1C  06/21/2023   Diabetic kidney evaluation - Urine ACR  02/02/2024   FOOT EXAM  02/02/2024   MAMMOGRAM  07/12/2024   PAP SMEAR-Modifier  08/12/2025   DTaP/Tdap/Td (3 - Td or Tdap) 01/06/2032   Colonoscopy  05/29/2032   HIV Screening  Completed   Zoster Vaccines- Shingrix  Completed   HPV VACCINES  Aged Out     ----------------------------------------------------------------------------------------------------------------------------------------------------------------------------------------------------------------- Physical Exam There were no vitals taken for this visit.  Physical Exam Constitutional:      Appearance: Normal appearance.  HENT:     Head: Normocephalic and atraumatic.  Cardiovascular:     Rate and Rhythm: Normal rate and regular rhythm.   Pulmonary:     Effort: Pulmonary effort is normal.     Breath sounds: Normal breath sounds.  Neurological:     Mental Status: She is alert.  Psychiatric:        Mood and Affect: Mood normal.     ------------------------------------------------------------------------------------------------------------------------------------------------------------------------------------------------------------------- Assessment and Plan  Diabetes mellitus due to underlying condition with hyperglycemia (HCC) Blood sugars improved on recent labs.  Continue synjardy.  Encouraged continued dietary changes.    Hyperlipidemia associated with type 2  diabetes mellitus (HCC) Cholesterol improved.  Off  pravastatin.  Plant based diet currently.    Psoriatic arthritis (HCC) Stable with Taltz.  Continue management per rheumatology.   Well adult exam Well adult Orders Placed This Encounter  Procedures   Lipid panel    This external order was created through the Results Console.   Hemoglobin A1c    This external order was created through the Results Console.  Screenings: Reviewed recent labs.  Immunizations: Shingrix Anticipatory guidance/Risk factor reduction:  Recommendations per AVS.     No orders of the defined types were placed in this encounter.   Return in about 6 months (around 08/05/2023) for HTN/T2DM.    This visit occurred during the SARS-CoV-2 public health emergency.  Safety protocols were in place, including screening questions prior to the visit, additional usage of staff PPE, and extensive cleaning of exam room while observing appropriate contact time as indicated for disinfecting solutions.

## 2023-02-02 NOTE — Assessment & Plan Note (Addendum)
Cholesterol improved.  Off  pravastatin.  Plant based diet currently.

## 2023-02-05 ENCOUNTER — Other Ambulatory Visit (HOSPITAL_COMMUNITY): Payer: Self-pay

## 2023-02-05 DIAGNOSIS — F411 Generalized anxiety disorder: Secondary | ICD-10-CM | POA: Diagnosis not present

## 2023-02-12 DIAGNOSIS — F411 Generalized anxiety disorder: Secondary | ICD-10-CM | POA: Diagnosis not present

## 2023-02-19 DIAGNOSIS — F411 Generalized anxiety disorder: Secondary | ICD-10-CM | POA: Diagnosis not present

## 2023-02-19 NOTE — Progress Notes (Unsigned)
[name] [address] [place] [address] Phone: [phone] Subjective:   [name], am serving as a scribe for Dr. Antoine Primas.  I'm seeing this patient by the request  of:  Everrett Coombe, DO  CC: Back and neck pain follow-up  [id number]  Tabitha [name] is a 57 y.o. female coming in with complaint of back and neck pain. OMT 01/10/2023. Patient states that she has been more achy than usual but is improving. Neck pain is her biggest issue today.   Medications patient has been prescribed: None  Taking:         Reviewed prior external information including notes and imaging from previsou exam, outside providers and external EMR if available.   As well as notes that were available from care everywhere and other healthcare systems.  Past medical history, social, surgical and family history all reviewed in electronic medical record.  No pertanent information unless stated regarding to the chief complaint.   Past Medical History:  Diagnosis Date   Arthritis    Asthma    De Quervain's disease (tenosynovitis)    Dermatophytosis, scalp    Diabetes mellitus    Hypercholesteremia    Obesity    Polycystic ovarian disease    Psoriatic arthritis (HCC)    Wears glasses     Allergies  Allergen Reactions   Chocolate Anaphylaxis   Food     chocolate   Statins     Intolerant  lft elevation   Latex Rash   Methotrexate Rash     Review of Systems:  No headache, visual changes, nausea, vomiting, diarrhea, constipation, dizziness, abdominal pain, skin rash, fevers, chills, night sweats, weight loss, swollen lymph nodes, body aches, joint swelling, chest pain, shortness of breath, mood changes. POSITIVE muscle aches  Objective  Blood pressure 120/72, pulse (!) 52, height 5\' 3"  (1.6 m), weight 219 lb (99.3 kg), SpO2 97%.   General: No apparent distress alert and oriented x3 mood and affect normal, dressed appropriately.  HEENT:  Pupils equal, extraocular movements intact  Respiratory: Patient's speak in full sentences and does not appear short of breath  Cardiovascular: No lower extremity edema, non tender, no erythema  Back and neck pain does have some loss of lordosis.  Some tenderness to palpation with tightness in the right greater than left of the sacroiliac joint.  Osteopathic findings  C2 flexed rotated and side bent right C5 flexed rotated and side bent left T3 extended rotated and side bent right inhaled rib T7 extended rotated and side bent left L2 flexed rotated and side bent right Sacrum right on right       Assessment and Plan:  Low back pain Low back does have tightness noted, some mild stress noted, discussed HEP  Discussed which activities to do and which ones to avoid discussed which activities to do.  Increase activity slowly.  Patient is getting married in the next 11 days.  Follow-up again in 6-8 otherwise    Nonallopathic problems  Decision today to treat with OMT was based on Physical Exam  After verbal consent patient was treated with HVLA, ME, FPR techniques in cervical, rib, thoracic, lumbar, and sacral  areas  Patient tolerated the procedure well with improvement in symptoms  Patient given exercises, stretches and lifestyle modifications  See medications in patient instructions if given  Patient will follow up in 4-8 weeks    The above documentation has been reviewed and is accurate and complete  [name], DO          Note: This dictation was prepared with Dragon dictation along with smaller phrase technology. Any transcriptional errors that result from this process are unintentional.

## 2023-02-21 ENCOUNTER — Encounter: Payer: Self-pay | Admitting: Family Medicine

## 2023-02-21 ENCOUNTER — Ambulatory Visit: Payer: Commercial Managed Care - PPO | Admitting: Family Medicine

## 2023-02-21 VITALS — BP 120/72 | HR 52 | Ht 63.0 in | Wt 219.0 lb

## 2023-02-21 DIAGNOSIS — M9908 Segmental and somatic dysfunction of rib cage: Secondary | ICD-10-CM

## 2023-02-21 DIAGNOSIS — M9901 Segmental and somatic dysfunction of cervical region: Secondary | ICD-10-CM | POA: Diagnosis not present

## 2023-02-21 DIAGNOSIS — M9902 Segmental and somatic dysfunction of thoracic region: Secondary | ICD-10-CM | POA: Diagnosis not present

## 2023-02-21 DIAGNOSIS — M9903 Segmental and somatic dysfunction of lumbar region: Secondary | ICD-10-CM | POA: Diagnosis not present

## 2023-02-21 DIAGNOSIS — M9904 Segmental and somatic dysfunction of sacral region: Secondary | ICD-10-CM | POA: Diagnosis not present

## 2023-02-21 DIAGNOSIS — M545 Low back pain, unspecified: Secondary | ICD-10-CM

## 2023-02-21 DIAGNOSIS — G8929 Other chronic pain: Secondary | ICD-10-CM | POA: Diagnosis not present

## 2023-02-21 NOTE — Assessment & Plan Note (Signed)
Low back does have tightness noted, some mild stress noted, discussed HEP  Discussed which activities to do and which ones to avoid discussed which activities to do.  Increase activity slowly.  Patient is getting married in the next 11 days.  Follow-up again in 6-8 otherwise

## 2023-02-21 NOTE — Patient Instructions (Signed)
Good to see you! Limit ROM on some of the weights See you again in 5 weeks

## 2023-02-22 ENCOUNTER — Other Ambulatory Visit (HOSPITAL_COMMUNITY): Payer: Self-pay

## 2023-02-26 ENCOUNTER — Other Ambulatory Visit (HOSPITAL_COMMUNITY): Payer: Self-pay

## 2023-02-27 ENCOUNTER — Other Ambulatory Visit: Payer: Self-pay

## 2023-02-27 DIAGNOSIS — G4733 Obstructive sleep apnea (adult) (pediatric): Secondary | ICD-10-CM | POA: Diagnosis not present

## 2023-03-03 ENCOUNTER — Encounter (HOSPITAL_COMMUNITY): Payer: Self-pay

## 2023-03-06 ENCOUNTER — Other Ambulatory Visit (HOSPITAL_COMMUNITY): Payer: Self-pay

## 2023-03-06 MED ORDER — LISDEXAMFETAMINE DIMESYLATE 60 MG PO CAPS
60.0000 mg | ORAL_CAPSULE | Freq: Every day | ORAL | 0 refills | Status: DC
  Filled 2023-03-06: qty 30, 30d supply, fill #0
  Filled 2023-03-07: qty 10, 10d supply, fill #0
  Filled 2023-03-07: qty 20, 20d supply, fill #0

## 2023-03-07 ENCOUNTER — Other Ambulatory Visit: Payer: Self-pay

## 2023-03-07 ENCOUNTER — Other Ambulatory Visit (HOSPITAL_COMMUNITY): Payer: Self-pay

## 2023-03-07 ENCOUNTER — Telehealth: Payer: Commercial Managed Care - PPO | Admitting: Physician Assistant

## 2023-03-07 DIAGNOSIS — B3731 Acute candidiasis of vulva and vagina: Secondary | ICD-10-CM

## 2023-03-07 MED ORDER — FLUCONAZOLE 150 MG PO TABS
ORAL_TABLET | ORAL | 0 refills | Status: DC
Start: 2023-03-07 — End: 2023-04-23
  Filled 2023-03-07: qty 2, 2d supply, fill #0
  Filled 2023-03-07: qty 2, 3d supply, fill #0

## 2023-03-07 NOTE — Progress Notes (Signed)

## 2023-03-07 NOTE — Progress Notes (Signed)
I have spent 5 minutes in review of e-visit questionnaire, review and updating patient chart, medical decision making and response to patient.   Mia Milan Cody Jacklynn Dehaas, PA-C    

## 2023-03-08 ENCOUNTER — Other Ambulatory Visit (HOSPITAL_COMMUNITY): Payer: Self-pay

## 2023-03-12 DIAGNOSIS — F411 Generalized anxiety disorder: Secondary | ICD-10-CM | POA: Diagnosis not present

## 2023-03-19 DIAGNOSIS — F411 Generalized anxiety disorder: Secondary | ICD-10-CM | POA: Diagnosis not present

## 2023-03-21 ENCOUNTER — Other Ambulatory Visit: Payer: Self-pay

## 2023-03-21 DIAGNOSIS — L409 Psoriasis, unspecified: Secondary | ICD-10-CM | POA: Diagnosis not present

## 2023-03-21 DIAGNOSIS — M150 Primary generalized (osteo)arthritis: Secondary | ICD-10-CM | POA: Diagnosis not present

## 2023-03-21 DIAGNOSIS — L4050 Arthropathic psoriasis, unspecified: Secondary | ICD-10-CM | POA: Diagnosis not present

## 2023-03-21 DIAGNOSIS — Z7960 Long term (current) use of unspecified immunomodulators and immunosuppressants: Secondary | ICD-10-CM | POA: Diagnosis not present

## 2023-03-21 DIAGNOSIS — R5382 Chronic fatigue, unspecified: Secondary | ICD-10-CM | POA: Diagnosis not present

## 2023-03-21 NOTE — Progress Notes (Signed)
Specialty Pharmacy Refill Coordination Note  Tabitha [name] is a 57 y.o. female contacted today regarding refills of specialty medication(s) Ixekizumab   Patient requested [name] at [facility] at [place] date: 03/28/23   Medication will be filled on 03/27/23.

## 2023-03-27 DIAGNOSIS — F411 Generalized anxiety disorder: Secondary | ICD-10-CM | POA: Diagnosis not present

## 2023-03-27 NOTE — Progress Notes (Unsigned)
Tabitha [name] [address] [place] [address] Phone: [phone] Subjective:   [name], am serving as a scribe for Dr. Antoine Primas.  I'm seeing this patient by the request  of:  Everrett Coombe, DO  CC: Back and neck pain follow-up  [id number]  Tabitha [name] is a 57 y.o. female coming in with complaint of back and neck pain. OMT 02/21/2023. Patient states that she has been doing ok.  Patient did recently get married.  Feels like that stress seems to be off her shoulders now.  Still dealing with stress at work.  Medications patient has been prescribed: None  Taking:         Reviewed prior external information including notes and imaging from previsou exam, outside providers and external EMR if available.   As well as notes that were available from care everywhere and other healthcare systems.  Past medical history, social, surgical and family history all reviewed in electronic medical record.  No pertanent information unless stated regarding to the chief complaint.   Past Medical History:  Diagnosis Date   Arthritis    Asthma    De Quervain's disease (tenosynovitis)    Dermatophytosis, scalp    Diabetes mellitus    Hypercholesteremia    Obesity    Polycystic ovarian disease    Psoriatic arthritis (HCC)    Wears glasses     Allergies  Allergen Reactions   Chocolate Anaphylaxis   Food     chocolate   Statins     Intolerant  lft elevation   Latex Rash   Methotrexate Rash     Review of Systems:  No headache, visual changes, nausea, vomiting, diarrhea, constipation, dizziness, abdominal pain, skin rash, fevers, chills, night sweats, weight loss, swollen lymph nodes, body aches, joint swelling, chest pain, shortness of breath, mood changes. POSITIVE muscle aches  Objective  Blood pressure 118/78, pulse 84, height 5\' 3"  (1.6 m), weight 221 lb (100.2 kg), SpO2 98%.   General: No apparent distress alert and oriented x3  mood and affect normal, dressed appropriately.  HEENT: Pupils equal, extraocular movements intact  Respiratory: Patient's speak in full sentences and does not appear short of breath  Cardiovascular: No lower extremity edema, non tender, no erythema  Back does have some loss of lordosis noted.  Some tenderness to palpation in the paraspinal musculature.  Tightness with Pearlean Brownie right greater than left.  Osteopathic findings  C2 flexed rotated and side bent right C4 flexed rotated and side bent left T4 extended rotated and side bent right inhaled rib T8 extended rotated and side bent left L2 flexed rotated and side bent right L3 flexed rotated and side bent left Sacrum right on right       Assessment and Plan:  Low back pain Still working on heart loss but patient is doing remarkably well.  Continuing to be active.  Notation some autoimmune disease seems to be well-controlled at the moment.  Increase activity slowly otherwise.  Follow-up with me 6 to 8 weeks    Nonallopathic problems  Decision today to treat with OMT was based on Physical Exam  After verbal consent patient was treated with HVLA, ME, FPR techniques in cervical, rib, thoracic, lumbar, and sacral  areas avoided HVLA on the cervical spine  Patient tolerated the procedure well with improvement in symptoms  Patient given exercises, stretches and lifestyle modifications  See medications in patient instructions if given  Patient will follow up in 4-8  weeks    The above documentation has been reviewed and is accurate and complete [name], DO          Note: This dictation was prepared with Dragon dictation along with smaller phrase technology. Any transcriptional errors that result from this process are unintentional.

## 2023-03-28 ENCOUNTER — Encounter: Payer: Self-pay | Admitting: Family Medicine

## 2023-03-28 ENCOUNTER — Ambulatory Visit: Payer: Commercial Managed Care - PPO | Admitting: Family Medicine

## 2023-03-28 ENCOUNTER — Other Ambulatory Visit (HOSPITAL_COMMUNITY): Payer: Self-pay

## 2023-03-28 VITALS — BP 118/78 | HR 84 | Ht 63.0 in | Wt 221.0 lb

## 2023-03-28 DIAGNOSIS — M9903 Segmental and somatic dysfunction of lumbar region: Secondary | ICD-10-CM

## 2023-03-28 DIAGNOSIS — M9902 Segmental and somatic dysfunction of thoracic region: Secondary | ICD-10-CM

## 2023-03-28 DIAGNOSIS — M9908 Segmental and somatic dysfunction of rib cage: Secondary | ICD-10-CM | POA: Diagnosis not present

## 2023-03-28 DIAGNOSIS — M9904 Segmental and somatic dysfunction of sacral region: Secondary | ICD-10-CM | POA: Diagnosis not present

## 2023-03-28 DIAGNOSIS — G8929 Other chronic pain: Secondary | ICD-10-CM

## 2023-03-28 DIAGNOSIS — M9901 Segmental and somatic dysfunction of cervical region: Secondary | ICD-10-CM | POA: Diagnosis not present

## 2023-03-28 DIAGNOSIS — M545 Low back pain, unspecified: Secondary | ICD-10-CM | POA: Diagnosis not present

## 2023-03-28 NOTE — Patient Instructions (Signed)
See you again in 6 weeks Good to see you!

## 2023-03-28 NOTE — Assessment & Plan Note (Signed)
Still working on heart loss but patient is doing remarkably well.  Continuing to be active.  Notation some autoimmune disease seems to be well-controlled at the moment.  Increase activity slowly otherwise.  Follow-up with me 6 to 8 weeks

## 2023-03-29 ENCOUNTER — Encounter: Payer: Self-pay | Admitting: Family Medicine

## 2023-03-31 ENCOUNTER — Other Ambulatory Visit (HOSPITAL_COMMUNITY): Payer: Self-pay

## 2023-04-02 ENCOUNTER — Other Ambulatory Visit (HOSPITAL_COMMUNITY): Payer: Self-pay

## 2023-04-02 MED ORDER — LISDEXAMFETAMINE DIMESYLATE 60 MG PO CAPS
60.0000 mg | ORAL_CAPSULE | Freq: Every day | ORAL | 0 refills | Status: DC
  Filled 2023-04-02 – 2023-04-03 (×2): qty 30, 30d supply, fill #0

## 2023-04-03 ENCOUNTER — Other Ambulatory Visit: Payer: Self-pay

## 2023-04-03 ENCOUNTER — Other Ambulatory Visit (HOSPITAL_COMMUNITY): Payer: Self-pay

## 2023-04-04 ENCOUNTER — Other Ambulatory Visit (HOSPITAL_COMMUNITY): Payer: Self-pay

## 2023-04-04 DIAGNOSIS — F411 Generalized anxiety disorder: Secondary | ICD-10-CM | POA: Diagnosis not present

## 2023-04-11 DIAGNOSIS — F4322 Adjustment disorder with anxiety: Secondary | ICD-10-CM | POA: Diagnosis not present

## 2023-04-17 DIAGNOSIS — F4322 Adjustment disorder with anxiety: Secondary | ICD-10-CM | POA: Diagnosis not present

## 2023-04-18 ENCOUNTER — Other Ambulatory Visit: Payer: Self-pay

## 2023-04-18 ENCOUNTER — Other Ambulatory Visit (HOSPITAL_COMMUNITY): Payer: Self-pay

## 2023-04-18 NOTE — Progress Notes (Signed)
Specialty Pharmacy Refill Coordination Note  Tabitha [name] is a 57 y.o. female contacted today regarding refills of specialty medication(s) Ixekizumab   Patient requested [name] at [facility] at [place] date: 04/25/23   Medication will be filled on 04/24/23.

## 2023-04-19 ENCOUNTER — Other Ambulatory Visit: Payer: Self-pay

## 2023-04-23 ENCOUNTER — Other Ambulatory Visit (HOSPITAL_COMMUNITY): Payer: Self-pay

## 2023-04-23 MED ORDER — FLUCONAZOLE 150 MG PO TABS
150.0000 mg | ORAL_TABLET | Freq: Once | ORAL | 0 refills | Status: AC
  Filled 2023-04-23: qty 1, 1d supply, fill #0

## 2023-04-24 ENCOUNTER — Other Ambulatory Visit (HOSPITAL_COMMUNITY): Payer: Self-pay

## 2023-04-28 ENCOUNTER — Other Ambulatory Visit (HOSPITAL_COMMUNITY): Payer: Self-pay

## 2023-04-30 ENCOUNTER — Other Ambulatory Visit (HOSPITAL_COMMUNITY): Payer: Self-pay

## 2023-04-30 ENCOUNTER — Other Ambulatory Visit: Payer: Self-pay

## 2023-04-30 DIAGNOSIS — F4322 Adjustment disorder with anxiety: Secondary | ICD-10-CM | POA: Diagnosis not present

## 2023-04-30 MED ORDER — BIMZELX 160 MG/ML ~~LOC~~ SOAJ: SUBCUTANEOUS | 1 refills | Status: DC

## 2023-04-30 MED ORDER — BIMZELX 160 MG/ML ~~LOC~~ SOAJ: SUBCUTANEOUS | 0 refills | Status: DC

## 2023-04-30 MED ORDER — BIMZELX 160 MG/ML ~~LOC~~ SOSY: PREFILLED_SYRINGE | SUBCUTANEOUS | 4 refills | Status: DC

## 2023-05-01 DIAGNOSIS — H43393 Other vitreous opacities, bilateral: Secondary | ICD-10-CM | POA: Diagnosis not present

## 2023-05-01 DIAGNOSIS — Z7984 Long term (current) use of oral hypoglycemic drugs: Secondary | ICD-10-CM | POA: Diagnosis not present

## 2023-05-01 DIAGNOSIS — H5213 Myopia, bilateral: Secondary | ICD-10-CM | POA: Diagnosis not present

## 2023-05-01 DIAGNOSIS — Z83511 Family history of glaucoma: Secondary | ICD-10-CM | POA: Diagnosis not present

## 2023-05-01 DIAGNOSIS — E119 Type 2 diabetes mellitus without complications: Secondary | ICD-10-CM | POA: Diagnosis not present

## 2023-05-01 DIAGNOSIS — H40023 Open angle with borderline findings, high risk, bilateral: Secondary | ICD-10-CM | POA: Diagnosis not present

## 2023-05-01 DIAGNOSIS — H2513 Age-related nuclear cataract, bilateral: Secondary | ICD-10-CM | POA: Diagnosis not present

## 2023-05-01 DIAGNOSIS — H524 Presbyopia: Secondary | ICD-10-CM | POA: Diagnosis not present

## 2023-05-01 DIAGNOSIS — H52203 Unspecified astigmatism, bilateral: Secondary | ICD-10-CM | POA: Diagnosis not present

## 2023-05-02 ENCOUNTER — Telehealth: Payer: Self-pay | Admitting: Pharmacist

## 2023-05-02 NOTE — Progress Notes (Unsigned)
[name] [address] [place] [address] Phone: [phone] Subjective:   Tabitha [name], am serving as a scribe for Dr. Antoine Primas.  I'm seeing this patient by the request  of:  Everrett Coombe, DO  CC: back and neck pain follow up   [id number]  Tabitha [name] is a 57 y.o. female coming in with complaint of back and neck pain. OMT on 03/28/2023. Patient states that she is having R shoulder pain in the joint. Unable to sleep on that side.          Reviewed prior external information including notes and imaging from previsou exam, outside providers and external EMR if available.   As well as notes that were available from care everywhere and other healthcare systems.  Past medical history, social, surgical and family history all reviewed in electronic medical record.  No pertanent information unless stated regarding to the chief complaint.   Past Medical History:  Diagnosis Date   Arthritis    Asthma    De Quervain's disease (tenosynovitis)    Dermatophytosis, scalp    Diabetes mellitus    Hypercholesteremia    Obesity    Polycystic ovarian disease    Psoriatic arthritis (HCC)    Wears glasses     Allergies  Allergen Reactions   Chocolate Anaphylaxis   Food     chocolate   Statins     Intolerant  lft elevation   Latex Rash   Methotrexate Rash     Review of Systems:  No headache, visual changes, nausea, vomiting, diarrhea, constipation, dizziness, abdominal pain, skin rash, fevers, chills, night sweats, weight loss, swollen lymph nodes, body aches, joint swelling, chest pain, shortness of breath, mood changes. POSITIVE muscle aches  Objective  Blood pressure 128/74, height 5\' 3"  (1.6 m).   General: No apparent distress alert and oriented x3 mood and affect normal, dressed appropriately.  HEENT: Pupils equal, extraocular movements intact  Respiratory: Patient's speak in full sentences and does not appear short of  breath  Cardiovascular: No lower extremity edema, non tender, no erythema  MSK:  Back loss of lordosis tightness of faber negative SLT  Right shoulder positive impingement positive crossover test also noted.  Positive impingement noted.  Limited muscular skeletal ultrasound was performed and interpreted by Antoine Primas, M   Ultrasound of the shoulder shows there is calcific changes noted in the supraspinatus area.  Arthritic changes noted and some hypoechoic changes of the acromioclavicular joint. Impression: Arthritic changes noted acromioclavicular joint and calcific tendinitis  Osteopathic findings  C2 flexed rotated and side bent right C5 flexed rotated and side bent left T3 extended rotated and side bent right inhaled rib T7 extended rotated and side bent left L2 flexed rotated and side bent right Sacrum right on right    Assessment and Plan:  Arthritis of both acromioclavicular joints Significant narrowing noted with hypoechoic changes.  Secondary to patient having a recent diagnosis of elevation at pressure we will hold on any injection at the moment.  Patient will start with some icing regimen and home exercises.  Follow-up again in 6 to 8 weeks    Nonallopathic problems  Decision today to treat with OMT was based on Physical Exam  After verbal consent patient was treated with HVLA, ME, FPR techniques in cervical, rib, thoracic, lumbar, and sacral  areas  Patient tolerated the procedure well with improvement in symptoms  Patient given exercises, stretches and lifestyle modifications  See medications  in patient instructions if given  Patient will follow up in 4-8 weeks    The above documentation has been reviewed and is accurate and complete Tabitha Saa, DO          Note: This dictation was prepared with Dragon dictation along with smaller phrase technology. Any transcriptional errors that result from this process are unintentional.

## 2023-05-02 NOTE — Telephone Encounter (Signed)
Called patient to schedule an appointment for the [employer] [facility]. I was unable to reach the patient so I left a HIPAA-compliant message requesting that the patient return my call.   Luke Van Ausdall, PharmD, BCACP, CPP Clinical Pharmacist [facility] [phone]  

## 2023-05-03 ENCOUNTER — Encounter: Payer: Self-pay | Admitting: Family Medicine

## 2023-05-03 ENCOUNTER — Other Ambulatory Visit: Payer: Self-pay

## 2023-05-03 ENCOUNTER — Ambulatory Visit: Payer: Commercial Managed Care - PPO | Admitting: Family Medicine

## 2023-05-03 ENCOUNTER — Ambulatory Visit: Payer: Self-pay

## 2023-05-03 ENCOUNTER — Encounter: Payer: Self-pay | Admitting: Pharmacist

## 2023-05-03 ENCOUNTER — Ambulatory Visit: Payer: Commercial Managed Care - PPO | Attending: Family Medicine | Admitting: Pharmacist

## 2023-05-03 VITALS — BP 128/74 | Ht 63.0 in

## 2023-05-03 DIAGNOSIS — G8929 Other chronic pain: Secondary | ICD-10-CM

## 2023-05-03 DIAGNOSIS — M9904 Segmental and somatic dysfunction of sacral region: Secondary | ICD-10-CM

## 2023-05-03 DIAGNOSIS — M9901 Segmental and somatic dysfunction of cervical region: Secondary | ICD-10-CM

## 2023-05-03 DIAGNOSIS — M19012 Primary osteoarthritis, left shoulder: Secondary | ICD-10-CM

## 2023-05-03 DIAGNOSIS — M9903 Segmental and somatic dysfunction of lumbar region: Secondary | ICD-10-CM

## 2023-05-03 DIAGNOSIS — M9902 Segmental and somatic dysfunction of thoracic region: Secondary | ICD-10-CM | POA: Diagnosis not present

## 2023-05-03 DIAGNOSIS — M9908 Segmental and somatic dysfunction of rib cage: Secondary | ICD-10-CM | POA: Diagnosis not present

## 2023-05-03 DIAGNOSIS — M19011 Primary osteoarthritis, right shoulder: Secondary | ICD-10-CM

## 2023-05-03 DIAGNOSIS — M25511 Pain in right shoulder: Secondary | ICD-10-CM

## 2023-05-03 DIAGNOSIS — M545 Low back pain, unspecified: Secondary | ICD-10-CM

## 2023-05-03 DIAGNOSIS — Z79899 Other long term (current) drug therapy: Secondary | ICD-10-CM

## 2023-05-03 NOTE — Assessment & Plan Note (Signed)
Low back does have some loss of lordosis.  Still working on core strength.  Patient is doing a lot of dead lifting.

## 2023-05-03 NOTE — Progress Notes (Signed)
    S: Patient presents today for review of their specialty medication.   Patient is currently taking Bimzelx for psoriatic arthritis. Patient is managed by Dr. Allena Katz for this.   Dosing: 320 mg subcutaneously on weeks 0, 4, 8, 12, and 16 followed by 320 mg once every 8 weeks thereafter. In pts > 120 kg, mt dose may be administered as 320 mg once every 4 weeks.   Adherence: has not yet started  Efficacy: has not started  Monitoring:  -TB testing: completed  -Liver enzymes: wnl (03/21/2023)   Current adverse effects: -Injection site reaction: none -GI side effects: none -HA: none -Psychiatric effects: none  O:  Lab Results  Component Value Date   WBC 7.9 04/04/2022   HGB 15.5 04/04/2022   HCT 46.2 (H) 04/04/2022   MCV 91.1 04/04/2022   PLT 182 04/04/2022      Chemistry      Component Value Date/Time   NA 138 04/04/2022 0000   K 4.4 04/04/2022 0000   CL 103 04/04/2022 0000   CO2 28 04/04/2022 0000   BUN 18 04/04/2022 0000   CREATININE 0.72 04/04/2022 0000      Component Value Date/Time   CALCIUM 9.9 04/04/2022 0000   ALKPHOS 71 04/05/2017 0817   AST 12 04/04/2022 0000   ALT 18 04/04/2022 0000   BILITOT 0.5 04/04/2022 0000       A/P: 1. Medication review: patient currently on Bimzelx for psoriasis. She is managed by Dr. Allena Katz for this. Reviewed the medication with her including the following: bimekizumab is a human monoclonal Ab that selectively binds to IL-17A, IL-76F, and 17-AF cytokines. This decreases the release of proinflammatory cytokines and decreases inflammation involved in psoriasis. It is supplies as an autoinjector and PFS. It should be stored in the fridge but may be removed before injection to allow to reach RT prior to administration. Injection site reaction, GI side effects, and headaches can occur but are not common. Rarely, Bimzelx can precipitate elevations in LFTs, psychiatric changes, or increase infection risks. Patient with no questions or  concerns at this time. I have no recommendation for any changes.    Tabitha [name], PharmD, Tabitha [name], CPP Clinical Pharmacist [facility] [phone]

## 2023-05-03 NOTE — Assessment & Plan Note (Signed)
Significant narrowing noted with hypoechoic changes.  Secondary to patient having a recent diagnosis of elevation at pressure we will hold on any injection at the moment.  Patient will start with some icing regimen and home exercises.  Follow-up again in 6 to 8 weeks

## 2023-05-03 NOTE — Patient Instructions (Signed)
Think about PRP See me again in 6-8 weeks

## 2023-05-04 ENCOUNTER — Encounter: Payer: Self-pay | Admitting: Family Medicine

## 2023-05-04 ENCOUNTER — Other Ambulatory Visit: Payer: Self-pay

## 2023-05-04 ENCOUNTER — Encounter (HOSPITAL_COMMUNITY): Payer: Self-pay

## 2023-05-04 ENCOUNTER — Other Ambulatory Visit (HOSPITAL_COMMUNITY): Payer: Self-pay

## 2023-05-04 ENCOUNTER — Other Ambulatory Visit: Payer: Self-pay | Admitting: Pharmacist

## 2023-05-04 MED ORDER — BIMZELX 160 MG/ML ~~LOC~~ SOAJ
SUBCUTANEOUS | 0 refills | Status: DC
  Filled 2023-05-04: qty 10, fill #0
  Filled 2023-05-04: qty 2, 28d supply, fill #0
  Filled 2023-05-29: qty 2, 28d supply, fill #1
  Filled 2023-06-29: qty 2, 28d supply, fill #2
  Filled 2023-07-30: qty 2, 28d supply, fill #3
  Filled 2023-08-23: qty 2, 28d supply, fill #4

## 2023-05-04 MED ORDER — BIMZELX 160 MG/ML ~~LOC~~ SOAJ: SUBCUTANEOUS | 1 refills | Status: DC

## 2023-05-04 MED ORDER — BIMZELX 160 MG/ML ~~LOC~~ SOAJ
SUBCUTANEOUS | 1 refills | Status: DC
  Filled 2023-05-04: qty 6, fill #0
  Filled 2023-10-18: qty 2, 28d supply, fill #0
  Filled 2023-12-17 – 2023-12-19 (×3): qty 2, 28d supply, fill #1
  Filled 2024-01-11: qty 2, 28d supply, fill #2

## 2023-05-04 NOTE — Progress Notes (Signed)
Please see OV 05/03/23 for full documentation.   Tabitha [name], PharmD, Tabitha [name], CPP Clinical Pharmacist [facility] [phone]

## 2023-05-04 NOTE — Progress Notes (Signed)
Specialty Pharmacy Initial Fill Coordination Note  Tabitha [name] is a 57 y.o. female contacted today regarding refills of specialty medication(s) Bimekizumab-Bkzx   Patient requested [name] at [facility] at [place] date: 05/07/23   Medication will be filled on 11/25.   Patient is aware of $0 copayment.

## 2023-05-07 ENCOUNTER — Other Ambulatory Visit (HOSPITAL_COMMUNITY): Payer: Self-pay

## 2023-05-07 DIAGNOSIS — F4322 Adjustment disorder with anxiety: Secondary | ICD-10-CM | POA: Diagnosis not present

## 2023-05-07 MED ORDER — FREESTYLE LIBRE 3 PLUS SENSOR MISC
0 refills | Status: DC
  Filled 2023-05-07: qty 6, 84d supply, fill #0

## 2023-05-08 ENCOUNTER — Other Ambulatory Visit (HOSPITAL_COMMUNITY): Payer: Self-pay

## 2023-05-09 ENCOUNTER — Other Ambulatory Visit (HOSPITAL_COMMUNITY): Payer: Self-pay

## 2023-05-09 MED ORDER — LISDEXAMFETAMINE DIMESYLATE 60 MG PO CAPS
60.0000 mg | ORAL_CAPSULE | Freq: Every day | ORAL | 0 refills | Status: DC
  Filled 2023-05-09: qty 30, 30d supply, fill #0

## 2023-05-14 ENCOUNTER — Other Ambulatory Visit: Payer: Self-pay

## 2023-05-14 ENCOUNTER — Ambulatory Visit (HOSPITAL_COMMUNITY)
Admission: RE | Admit: 2023-05-14 | Discharge: 2023-05-14 | Disposition: A | Discharge status: Home / Self Care | Payer: Commercial Managed Care - PPO | Source: Ambulatory Visit | Attending: Internal Medicine | Admitting: Internal Medicine

## 2023-05-14 VITALS — BP 130/66 | HR 63 | Ht 63.0 in | Wt 221.0 lb

## 2023-05-14 DIAGNOSIS — F909 Attention-deficit hyperactivity disorder, unspecified type: Secondary | ICD-10-CM | POA: Insufficient documentation

## 2023-05-14 DIAGNOSIS — G4733 Obstructive sleep apnea (adult) (pediatric): Secondary | ICD-10-CM | POA: Insufficient documentation

## 2023-05-14 DIAGNOSIS — I480 Paroxysmal atrial fibrillation: Secondary | ICD-10-CM | POA: Diagnosis not present

## 2023-05-14 DIAGNOSIS — R9431 Abnormal electrocardiogram [ECG] [EKG]: Secondary | ICD-10-CM | POA: Diagnosis not present

## 2023-05-14 DIAGNOSIS — I4891 Unspecified atrial fibrillation: Secondary | ICD-10-CM

## 2023-05-14 DIAGNOSIS — E119 Type 2 diabetes mellitus without complications: Secondary | ICD-10-CM | POA: Insufficient documentation

## 2023-05-14 NOTE — Progress Notes (Signed)
Primary Care Physician: Everrett Coombe, DO Primary Cardiologist: None Electrophysiologist: Will Jorja Loa, MD     Referring Physician: Dr. Jake Samples is a 57 y.o. female with a history of ADHD, T2DM, obesity, OSA, and paroxysmal atrial fibrillation who presents for consultation in the [facility]. Patient is not on anticoagulation. She has a CHADS2VASC score of 2.  On evaluation today, she is currently in NSR. Since last office visit with Dr. Elberta Fortis, she has had very infrequent episodes of Afib. Since she stopped taking medication for yeast and began taking magnesium 1000 mg at bedtime, she has noticed her episodes have become even less frequent. She will go for weeks without an episode and if she does have an episode it will be for ~10 minutes. She is not on anticoagulation due to low Chads score.   Today, she denies symptoms of chest pain, shortness of breath, orthopnea, PND, lower extremity edema, dizziness, presyncope, syncope, snoring, daytime somnolence, bleeding, or neurologic sequela. The patient is tolerating medications without difficulties and is otherwise without complaint today.    Atrial Fibrillation Risk Factors:  she does have symptoms or diagnosis of sleep apnea. she is compliant with CPAP therapy.  she has a BMI of Body mass index is 39.15 kg/m.Marland Kitchen Filed Weights   05/14/23 0905  Weight: 100.2 kg    Current Outpatient Medications  Medication Sig Dispense Refill   ACCU-CHEK GUIDE test strip   3   Acetaminophen (TYLENOL PO) Take by mouth as needed. Not sure the dose     amphetamine-dextroamphetamine (ADDERALL) 10 MG tablet Take 1 tablet by mouth daily as directed (Patient taking differently: Only takes as needed) 30 tablet 0   bimekizumab-bkzx (BIMZELX) 160 MG/ML pen Inject 320 mg subcutaneously every 8 (eight) weeks MAINTENANCE DOSE: (Patient taking differently: Inject into the skin every 30 (thirty) days.) 6 mL 1    bimekizumab-bkzx (BIMZELX) 160 MG/ML pen Inject 320 mg subcutaneously every 28 (twenty-eight) days LOADING DOSE: Inject 320mg  (two 160mg  injections) SubQ at week 0, 4, 8, 12, and 16 10 mL 0   clobetasol (TEMOVATE) 0.05 % external solution Apply topically 2 (two) times daily (Patient taking differently: Apply topically as needed.) 50 mL 5   clotrimazole-betamethasone (LOTRISONE) cream Apply to affected area(s) on skin 2 (two) times daily. (Patient taking differently: Apply 1 application  topically as needed.) 30 g 0   Continuous Glucose Sensor (FREESTYLE LIBRE 3 PLUS SENSOR) MISC Change sensor every 15 days. 6 each 0   desonide (DESOWEN) 0.05 % cream Apply 1 application topically 2 (two) times daily. (Patient taking differently: Apply 1 Application topically as needed.) 60 g 5   Empagliflozin-metFORMIN HCl ER (SYNJARDY XR) 03-999 MG TB24 Take 1 tablet by mouth daily with breakfast. 90 tablet 4   glucose blood (FREESTYLE LITE) test strip Use to check blood sugar 3 times a day 300 strip 3   lisdexamfetamine (VYVANSE) 60 MG capsule Take 1 capsule (60 mg total) by mouth daily. 30 capsule 0   Naltrexone HCl, Pain, 4.5 MG CAPS Take 1 tablet by mouth every morning.     Omega-3 Fatty Acids (FISH OIL) 1200 MG CAPS Take 1-2 capsules (1,200-2,400 mg total) by mouth 2 (two) times daily. Takes 2400 mg qam and 1200 mg qpm     TURMERIC PO Take 1 tablet by mouth as needed.     No current facility-administered medications for this encounter.    Atrial Fibrillation Management history:  Previous antiarrhythmic drugs: none Previous cardioversions: none Previous ablations: none Anticoagulation history: none   ROS- All systems are reviewed and negative except as per the HPI above.  Physical Exam: BP 130/66   Pulse 63   Ht 5\' 3"  (1.6 m)   Wt 100.2 kg   BMI 39.15 kg/m   GEN: Well nourished, well developed in no acute distress NECK: No JVD; No carotid bruits CARDIAC: Regular rate and rhythm, no murmurs,  rubs, gallops RESPIRATORY:  Clear to auscultation without rales, wheezing or rhonchi  ABDOMEN: Soft, non-tender, non-distended EXTREMITIES:  No edema; No deformity   EKG today demonstrates  Vent. rate 63 BPM PR interval 142 ms QRS duration 72 ms QT/QTcB 404/413 ms P-R-T axes 12 27 16  Normal sinus rhythm Low voltage QRS Cannot rule out Anterior infarct , age undetermined Abnormal ECG When compared with ECG of 05-May-2013 10:00, PREVIOUS ECG IS PRESENT  Echo 08/29/22 demonstrated  1. Left ventricular ejection fraction by 3D volume is 62 %. The left  ventricle has normal function. The left ventricle has no regional wall  motion abnormalities. Left ventricular diastolic parameters are consistent  with Grade I diastolic dysfunction  (impaired relaxation).   2. Right ventricular systolic function is normal. The right ventricular  size is normal. Tricuspid regurgitation signal is inadequate for assessing  PA pressure.   3. The mitral valve is normal in structure. Trivial mitral valve  regurgitation. No evidence of mitral stenosis.   4. The aortic valve is tricuspid. Aortic valve regurgitation is not  visualized. No aortic stenosis is present.   5. The inferior vena cava is normal in size with <50% respiratory  variability, suggesting right atrial pressure of 8 mmHg.    ASSESSMENT & PLAN CHA2DS2-VASc Score = 2  The patient's score is based upon: CHF History: 0 HTN History: 0 Diabetes History: 1 Stroke History: 0 Vascular Disease History: 0 Age Score: 0 Gender Score: 1       ASSESSMENT AND PLAN: Paroxysmal Atrial Fibrillation (ICD10:  I48.0) The patient's CHA2DS2-VASc score is 2, indicating a 2.2% annual risk of stroke.    She is currently in NSR. We will continue with conservative observation. If she remains stable with very low burden, can consider annual appointments in the future.      Follow up 6 months with Dr. Elberta Fortis.    Lake Bells, PA-C  Afib  Clinic [facility] [address] [place], [place] [address] [phone]

## 2023-05-16 DIAGNOSIS — F4322 Adjustment disorder with anxiety: Secondary | ICD-10-CM | POA: Diagnosis not present

## 2023-05-21 ENCOUNTER — Other Ambulatory Visit: Payer: Self-pay

## 2023-05-21 DIAGNOSIS — F4322 Adjustment disorder with anxiety: Secondary | ICD-10-CM | POA: Diagnosis not present

## 2023-05-23 ENCOUNTER — Ambulatory Visit: Payer: Commercial Managed Care - PPO | Admitting: Family Medicine

## 2023-05-23 NOTE — Progress Notes (Signed)
[name] [address] [place] [address] Phone: [phone] Subjective:   Tabitha [name], am serving as a scribe for Dr. Antoine [name].  I'm seeing this patient by the request  of:  Tabitha Coombe, DO  CC: Shoulder pain, back pain follow-up  EXB:[id number]  05/03/2023 Significant narrowing noted with hypoechoic changes.  Secondary to patient having a recent diagnosis of elevation at pressure we will hold on any injection at the moment.  Patient will start with some icing regimen and home exercises.  Follow-up again in 6 to 8 weeks     Updated 05/24/2023 Tabitha [name] is a 57 y.o. female coming in with complaint of shoulder pain. Here for PRP. Patient states that she is doing much better. Less pain and popping. Started bimzelx and this has also been helpful.        Past Medical History:  Diagnosis Date   Arthritis    Asthma    De Quervain's disease (tenosynovitis)    Dermatophytosis, scalp    Diabetes mellitus    Hypercholesteremia    Obesity    Polycystic ovarian disease    Psoriatic arthritis (HCC)    Wears glasses    Past Surgical History:  Procedure Laterality Date   BREAST BIOPSY Right    CARPAL TUNNEL RELEASE     COLONOSCOPY     DE QUERVAIN'S RELEASE  2012   left   KNEE ARTHROPLASTY     KNEE ARTHROSCOPY Right 05/06/2013   Procedure: RIGHT KNEE ARTHROSCOPY ;  Surgeon: Tabitha Ochs, MD;  Location: [facility];  Service: Orthopedics;  Laterality: Right;  partial lateral minisectomy and chondroplasty   WISDOM TOOTH EXTRACTION     WRIST SURGERY  2001   carpel tunnel -rt   Social History   Socioeconomic History   Marital status: Married    Spouse name: Not on file   Number of children: Not on file   Years of education: Not on file   Highest education level: Master's degree (e.g., MA, MS, MEng, MEd, MSW, MBA)  Occupational History   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   Vaping Use   Vaping status: Never Used  Substance and Sexual Activity   Alcohol use: Yes    Comment: rarely   Drug use: No   Sexual activity: Yes    Partners: Male    Comment: married  Other Topics Concern   Not on file  Social History Narrative   Not on file   Social Drivers of Health   Financial Resource Strain: Low Risk  (10/03/2022)   Overall Financial Resource Strain (CARDIA)    Difficulty of Paying Living Expenses: Not hard at all  Food Insecurity: No Food Insecurity (10/03/2022)   Hunger Vital Sign    Worried About Running Out of Food in the Last Year: Never true    Ran Out of Food in the Last Year: Never true  Transportation Needs: No Transportation Needs (10/03/2022)   PRAPARE - Transportation    Lack  of Transportation (Medical): No    Lack of Transportation (Non-Medical): No  Physical Activity: Sufficiently Active (10/03/2022)   Exercise Vital Sign    Days of Exercise per Week: 4 days    Minutes of Exercise per Session: 150+ min  Stress: Stress Concern Present (10/03/2022)   [employer] of Occupational Health - Occupational Stress Questionnaire    Feeling of Stress : Very much  Social Connections: Unknown (10/03/2022)   Social Connection and Isolation Panel [NHANES]    Frequency of Communication with Friends and Family: More than three times a week    Frequency of Social Gatherings with Friends and Family: More than three times a week    Attends Religious Services: 1 to 4 times per year    Active Member of [employer] or Organizations: No    Attends Engineer, structural: Not on file    Marital Status: Patient declined    Allergies  Allergen Reactions   Chocolate Anaphylaxis   Food     chocolate   Statins     Intolerant  lft elevation   Latex Rash   Methotrexate Rash   Family History  Problem Relation Age of Onset   Diabetes Father    Hypertension Father    Stroke Father    Kidney disease Father    Heart disease Father    Cancer Mother 55       breast ca died at age 69   Breast cancer Mother 54   Hypertension Other    Diabetes type II Other    Obesity Other    Obesity Brother    Hypertension Brother    Heart disease Maternal Grandmother    Alzheimer's disease Maternal Grandmother    Diabetes Paternal Grandmother    Heart disease Paternal Grandfather     Current Outpatient Medications (Endocrine & Metabolic):    Empagliflozin-metFORMIN HCl ER (SYNJARDY XR) 03-999 MG TB24, Take 1 tablet by mouth daily with breakfast.    Current Outpatient Medications (Analgesics):    Acetaminophen (TYLENOL PO), Take by mouth as needed. Not sure the dose   Naltrexone HCl, Pain, 4.5 MG CAPS, Take 1 tablet by mouth every morning.   Current Outpatient Medications (Other):    ACCU-CHEK GUIDE test strip,    amphetamine-dextroamphetamine (ADDERALL) 10 MG tablet, Take 1 tablet by mouth daily as directed (Patient taking differently: Only takes as needed)   bimekizumab-bkzx (BIMZELX) 160 MG/ML pen, Inject 320 mg subcutaneously every 8 (eight) weeks MAINTENANCE DOSE: (Patient taking differently: Inject into the skin every 30 (thirty) days.)   bimekizumab-bkzx (BIMZELX) 160 MG/ML pen, Inject 320 mg subcutaneously every 28 (twenty-eight) days LOADING DOSE: Inject 320mg  (two 160mg  injections) SubQ at week 0, 4, 8, 12, and 16   clobetasol (TEMOVATE) 0.05 % external solution, Apply topically 2 (two) times daily (Patient taking differently: Apply topically as needed.)   clotrimazole-betamethasone (LOTRISONE) cream, Apply to affected area(s) on skin 2 (two) times daily. (Patient taking differently: Apply 1 application   topically as needed.)   Continuous Glucose Sensor (FREESTYLE LIBRE 3 PLUS SENSOR) MISC, Change sensor every 15 days.   desonide (DESOWEN) 0.05 % cream, Apply 1 application topically 2 (two) times daily. (Patient taking differently: Apply 1 Application topically as needed.)   glucose blood (FREESTYLE LITE) test strip, Use to check blood sugar 3 times a day   lisdexamfetamine (VYVANSE) 60 MG capsule, Take 1 capsule (60 mg total) by mouth daily.   Omega-3 Fatty Acids (FISH OIL) 1200 MG CAPS, Take 1-2 capsules (  1,200-2,400 mg total) by mouth 2 (two) times daily. Takes 2400 mg qam and 1200 mg qpm   TURMERIC PO, Take 1 tablet by mouth as needed.   Reviewed prior external information including notes and imaging from  primary care provider As well as notes that were available from care everywhere and other healthcare systems.  Past medical history, social, surgical and family history all reviewed in electronic medical record.  No pertanent information unless stated regarding to the chief complaint.   Review of Systems:  No headache, visual changes, nausea, vomiting, diarrhea, constipation, dizziness, abdominal pain, skin rash, fevers, chills, night sweats, weight loss, swollen lymph nodes, body aches, joint swelling, chest pain, shortness of breath, mood changes. POSITIVE muscle aches  Objective  Blood pressure 118/82, pulse 93, height 5\' 3"  (1.6 m), weight 226 lb (102.5 kg), SpO2 98%.   General: No apparent distress alert and oriented x3 mood and affect normal, dressed appropriately.  HEENT: Pupils equal, extraocular movements intact  Respiratory: Patient's speak in full sentences and does not appear short of breath  Cardiovascular: No lower extremity edema, non tender, no erythema  Low back exam does have some loss lordosis noted.  Some tenderness to palpation of the paraspinal musculature.  Patient's shoulders do show some mild impingement but nothing severe.   Osteopathic findings C2 flexed  rotated and side bent right C4 flexed rotated and side bent left C6 flexed rotated and side bent left T3 extended rotated and side bent right inhaled third rib T9 extended rotated and side bent left L2 flexed rotated and side bent right Sacrum right on right    Impression and Recommendations:    Low back pain Low back pain that is multifactorial still with patient's weight but BMI is nearly 40 which is quite abdominal.  Can ensure that this has helped patient tremendously.  Does have a psoriatic arthritis and is having a new medication.  Will not make any other significant changes while we see how patient responds to this.  Does respond well to osteopathic manipulation.  Known arthritic changes of the acromioclavicular joints that we will continue to monitor as well.  Follow-up again in 6 to 8 weeks      Decision today to treat with OMT was based on Physical Exam  After verbal consent patient was treated with HVLA, ME, FPR techniques in cervical, thoracic, rib, lumbar and sacral areas, all areas are chronic   Patient tolerated the procedure well with improvement in symptoms  Patient given exercises, stretches and lifestyle modifications  See medications in patient instructions if given  Patient will follow up in 4-8 weeks The above documentation has been reviewed and is accurate and complete [name], DO

## 2023-05-24 ENCOUNTER — Other Ambulatory Visit: Payer: Self-pay

## 2023-05-24 ENCOUNTER — Ambulatory Visit: Payer: Commercial Managed Care - PPO | Admitting: Family Medicine

## 2023-05-24 VITALS — BP 118/82 | HR 93 | Ht 63.0 in | Wt 226.0 lb

## 2023-05-24 DIAGNOSIS — M9908 Segmental and somatic dysfunction of rib cage: Secondary | ICD-10-CM | POA: Diagnosis not present

## 2023-05-24 DIAGNOSIS — G8929 Other chronic pain: Secondary | ICD-10-CM

## 2023-05-24 DIAGNOSIS — M545 Low back pain, unspecified: Secondary | ICD-10-CM | POA: Diagnosis not present

## 2023-05-24 DIAGNOSIS — M19011 Primary osteoarthritis, right shoulder: Secondary | ICD-10-CM | POA: Diagnosis not present

## 2023-05-24 DIAGNOSIS — M9904 Segmental and somatic dysfunction of sacral region: Secondary | ICD-10-CM

## 2023-05-24 DIAGNOSIS — M25511 Pain in right shoulder: Secondary | ICD-10-CM | POA: Diagnosis not present

## 2023-05-24 DIAGNOSIS — M19012 Primary osteoarthritis, left shoulder: Secondary | ICD-10-CM

## 2023-05-24 DIAGNOSIS — M9901 Segmental and somatic dysfunction of cervical region: Secondary | ICD-10-CM

## 2023-05-24 DIAGNOSIS — M9902 Segmental and somatic dysfunction of thoracic region: Secondary | ICD-10-CM

## 2023-05-24 DIAGNOSIS — L4050 Arthropathic psoriasis, unspecified: Secondary | ICD-10-CM

## 2023-05-24 DIAGNOSIS — M9903 Segmental and somatic dysfunction of lumbar region: Secondary | ICD-10-CM | POA: Diagnosis not present

## 2023-05-24 NOTE — Patient Instructions (Signed)
[employer] Keep deadlifting See you again in 6-8 weeks

## 2023-05-25 ENCOUNTER — Encounter: Payer: Self-pay | Admitting: Family Medicine

## 2023-05-25 NOTE — Assessment & Plan Note (Signed)
Low back pain that is multifactorial still with patient's weight but BMI is nearly 40 which is quite abdominal.  Can ensure that this has helped patient tremendously.  Does have a psoriatic arthritis and is having a new medication.  Will not make any other significant changes while we see how patient responds to this.  Does respond well to osteopathic manipulation.  Known arthritic changes of the acromioclavicular joints that we will continue to monitor as well.  Follow-up again in 6 to 8 weeks

## 2023-05-29 ENCOUNTER — Other Ambulatory Visit: Payer: Self-pay

## 2023-05-29 NOTE — Progress Notes (Signed)
Specialty Pharmacy Refill Coordination Note  Tabitha [name] is a 57 y.o. female contacted today regarding refills of specialty medication(s) Bimekizumab-bkzx (Bimzelx)   Patient requested [name] at [facility] [place] date: 06/04/23   Medication will be filled on 12.20.24 for 12.27.24 injection date.

## 2023-05-30 ENCOUNTER — Other Ambulatory Visit: Payer: Self-pay | Admitting: Medical Genetics

## 2023-06-01 ENCOUNTER — Other Ambulatory Visit: Payer: Self-pay

## 2023-06-04 ENCOUNTER — Other Ambulatory Visit (HOSPITAL_COMMUNITY): Payer: Self-pay

## 2023-06-04 DIAGNOSIS — F4322 Adjustment disorder with anxiety: Secondary | ICD-10-CM | POA: Diagnosis not present

## 2023-06-04 MED ORDER — LISDEXAMFETAMINE DIMESYLATE 60 MG PO CAPS
60.0000 mg | ORAL_CAPSULE | Freq: Every day | ORAL | 0 refills | Status: DC
  Filled 2023-06-04: qty 30, 30d supply, fill #0

## 2023-06-05 ENCOUNTER — Other Ambulatory Visit (HOSPITAL_COMMUNITY): Payer: Self-pay

## 2023-06-12 ENCOUNTER — Encounter: Payer: Self-pay | Admitting: Family Medicine

## 2023-06-12 DIAGNOSIS — Z79899 Other long term (current) drug therapy: Secondary | ICD-10-CM | POA: Diagnosis not present

## 2023-06-12 DIAGNOSIS — F902 Attention-deficit hyperactivity disorder, combined type: Secondary | ICD-10-CM | POA: Diagnosis not present

## 2023-06-18 ENCOUNTER — Encounter (INDEPENDENT_AMBULATORY_CARE_PROVIDER_SITE_OTHER): Payer: Self-pay | Admitting: Internal Medicine

## 2023-06-18 ENCOUNTER — Ambulatory Visit (INDEPENDENT_AMBULATORY_CARE_PROVIDER_SITE_OTHER): Payer: Commercial Managed Care - PPO | Admitting: Internal Medicine

## 2023-06-18 VITALS — BP 138/84 | HR 82 | Temp 98.3°F | Ht 61.0 in | Wt 220.0 lb

## 2023-06-18 DIAGNOSIS — E119 Type 2 diabetes mellitus without complications: Secondary | ICD-10-CM | POA: Insufficient documentation

## 2023-06-18 DIAGNOSIS — E66813 Obesity, class 3: Secondary | ICD-10-CM

## 2023-06-18 DIAGNOSIS — G4733 Obstructive sleep apnea (adult) (pediatric): Secondary | ICD-10-CM | POA: Diagnosis not present

## 2023-06-18 DIAGNOSIS — Z7984 Long term (current) use of oral hypoglycemic drugs: Secondary | ICD-10-CM

## 2023-06-18 DIAGNOSIS — E1169 Type 2 diabetes mellitus with other specified complication: Secondary | ICD-10-CM | POA: Diagnosis not present

## 2023-06-18 DIAGNOSIS — E669 Obesity, unspecified: Secondary | ICD-10-CM | POA: Insufficient documentation

## 2023-06-18 DIAGNOSIS — Z6841 Body Mass Index (BMI) 40.0 and over, adult: Secondary | ICD-10-CM

## 2023-06-18 DIAGNOSIS — E785 Hyperlipidemia, unspecified: Secondary | ICD-10-CM | POA: Diagnosis not present

## 2023-06-18 DIAGNOSIS — Z0289 Encounter for other administrative examinations: Secondary | ICD-10-CM

## 2023-06-18 LAB — HM DIABETES EYE EXAM

## 2023-06-18 NOTE — Assessment & Plan Note (Signed)
 HgbA1c is at goal for age and comorbid conditions. Denies symptoms of hypoglycemia or hyperglycemia. On SGLT2 and metformin  with good adherence and no side effects.  Had been on Victoza  in the past but medication was discontinued due to unspecified rash after 2 years of treatment.   Lab Results  Component Value Date   HGBA1C 6.2 12/19/2022   HGBA1C 7.4 09/15/2022   HGBA1C 6.5 (H) 04/04/2022   Lab Results  Component Value Date   MICROALBUR 10 02/02/2023   LDLCALC 130 09/15/2022   CREATININE 0.7 09/15/2022   We reviewed the carb insulin  model of obesity patient is definitely carb sensitive and would benefit from a low-carb moderate protein nutrition plan.  This will be implemented and further counseling will be provided at her new intake appointment.

## 2023-06-18 NOTE — Assessment & Plan Note (Signed)
 We reviewed anthropometrics, biometrics, associated medical conditions and contributing factors with patient. she would benefit from a medically tailored reduced calorie nutrional plan based on her REE (resting energy expenditure), which will be determined by indirect calorimetry.  We will also assess for cardiometabolic risk and nutritional derangements via fasting labs at intake appointment.

## 2023-06-18 NOTE — Assessment & Plan Note (Addendum)
 LDL is not at goal. Elevated LDL may be secondary to nutrition, genetics and spillover effect from excess adiposity. Recommended LDL goal is <70 to reduce the risk of fatty streaks and the progression to obstructive ASCVD in the future.   She is currently not on statin therapy might of had liver enzyme elevation in the past.  Her cardiovascular risk is likely higher than calculated due to the presence of diabetes and psoriatic arthritis.  Her 10 year risk is: The 10-year ASCVD risk score (Arnett DK, et al., 2019) is: 7.3%* (Cholesterol units were assumed)  Lab Results  Component Value Date   CHOL 189 12/19/2022   HDL 39 12/19/2022   LDLCALC 130 09/15/2022   LDLDIRECT 149 (H) 12/06/2011   TRIG 198 (A) 12/19/2022   CHOLHDL 2.4 04/04/2022   We will repeat fasting lipid profile with intake labs and assess cardiovascular risk.  We will also help guide decisions about lipid-lowering strategies.  Continue weight loss therapy, losing 10% or more of body weight may improve condition.  She would also benefit from reducing saturated fats in diet to less than 10% of daily calories.

## 2023-06-18 NOTE — Assessment & Plan Note (Signed)
 On CPAP with reported good compliance. Continue PAP therapy. Losing 15% or more of body weight may improve AHI.

## 2023-06-18 NOTE — Progress Notes (Signed)
 Office: [phone]  /  Fax: [phone]   Initial Visit  Tabitha [name] was seen in clinic today to evaluate for obesity. She is interested in losing weight to improve overall health and reduce the risk of weight related complications. She presents today to review program treatment options, initial physical assessment, and evaluation.     She was referred by: Specialist EP for AFIB  When asked what else they would like to accomplish? She states: Improve energy levels and physical activity, Improve existing medical conditions, Reduce number of medications, Improve quality of life, and Improve self-confidence  Weight history: Since young, gained more weight in 2016 due to personal problems.  When asked how has your weight affected you? She states: Contributed to medical problems, Contributed to orthopedic problems or mobility issues, Having fatigue, Having poor endurance, and Problems with eating patterns  Some associated conditions: Hypertension, Hyperlipidemia, OSA, and Diabetes  Contributing factors: Family history of obesity, Disruption of circadian rhythm / sleep disordered breathing, Reduced physical activity, Eating patterns, and Menopause  Weight promoting medications identified: None  Current nutrition plan: None  Current level of physical activity: Strength training 2-3 times a week  Current or previous pharmacotherapy: Phentermine - phen  Response to medication:  not long enough on medications.Had tried vicotza but developed possible rash   Past medical history includes:   Past Medical History:  Diagnosis Date   Arthritis    Asthma    De Quervain's disease (tenosynovitis)    Dermatophytosis, scalp    Diabetes mellitus    Hypercholesteremia    Obesity    Polycystic ovarian disease    Psoriatic arthritis (HCC)    Wears glasses      Objective:   BP 138/84   Pulse 82   Temp 98.3 F (36.8 C)   Ht 5' 1 (1.549 m)   Wt 220 lb (99.8 kg)   SpO2 98%   BMI  41.57 kg/m  She was weighed on the bioimpedance scale: Body mass index is 41.57 kg/m.  Peak Weight:280 , Body Fat%:47.7, Visceral Fat Rating:16, Weight trend over the last 12 months: Decreasing  General:  Alert, oriented and cooperative. Patient is in no acute distress.  Respiratory: Normal respiratory effort, no problems with respiration noted   Gait: able to ambulate independently  Mental Status: Normal mood and affect. Normal behavior. Normal judgment and thought content.   DIAGNOSTIC DATA REVIEWED:  BMET    Component Value Date/Time   NA 138 09/15/2022 0000   K 4.4 09/15/2022 0000   CL 102 09/15/2022 0000   CO2 23 (A) 09/15/2022 0000   GLUCOSE 140 (H) 04/04/2022 0000   BUN 14 09/15/2022 0000   CREATININE 0.7 09/15/2022 0000   CREATININE 0.72 04/04/2022 0000   CALCIUM 9.9 09/15/2022 0000   GFRNONAA 98 08/21/2019 0834   GFRAA 114 08/21/2019 0834   Lab Results  Component Value Date   HGBA1C 6.2 12/19/2022   HGBA1C 7.2 09/06/2011   No results found for: INSULIN  CBC    Component Value Date/Time   WBC 6.4 09/15/2022 0000   WBC 7.9 04/04/2022 0000   RBC 5.02 09/15/2022 0000   HGB 15.1 09/15/2022 0000   HCT 46 09/15/2022 0000   PLT 190 09/15/2022 0000   MCV 91.1 04/04/2022 0000   MCH 30.6 04/04/2022 0000   MCHC 33.5 04/04/2022 0000   RDW 11.8 04/04/2022 0000   Iron/TIBC/Ferritin/ %Sat    Component Value Date/Time   FERRITIN 71 09/15/2022 0000   Lipid Panel  Component Value Date/Time   CHOL 189 12/19/2022 0000   TRIG 198 (A) 12/19/2022 0000   HDL 39 12/19/2022 0000   CHOLHDL 2.4 04/04/2022 0000   VLDL 25.0 09/25/2019 0920   LDLCALC 130 09/15/2022 0000   LDLCALC 87 04/04/2022 0000   LDLDIRECT 149 (H) 12/06/2011 1702   Hepatic Function Panel     Component Value Date/Time   PROT 7.0 04/04/2022 0000   ALBUMIN 4.2 09/15/2022 0000   AST 20 09/15/2022 0000   ALT 42 (A) 09/15/2022 0000   ALKPHOS 98 09/15/2022 0000   BILITOT 0.5 04/04/2022 0000       Component Value Date/Time   TSH 1.23 09/15/2022 0000   TSH 1.02 12/11/2018 1102     Assessment and Plan:   OSA (obstructive sleep apnea) on CPAP Assessment & Plan: On CPAP with reported good compliance. Continue PAP therapy. Losing 15% or more of body weight may improve AHI.      Class 3 severe obesity with serious comorbidity and body mass index (BMI) of 40.0 to 44.9 in adult, unspecified obesity type [facility]) Assessment & Plan: We reviewed anthropometrics, biometrics, associated medical conditions and contributing factors with patient. she would benefit from a medically tailored reduced calorie nutrional plan based on her REE (resting energy expenditure), which will be determined by indirect calorimetry.  We will also assess for cardiometabolic risk and nutritional derangements via fasting labs at intake appointment.     Type 2 diabetes mellitus with other specified complication, without long-term current use of insulin  [facility]) Assessment & Plan: HgbA1c is at goal for age and comorbid conditions. Denies symptoms of hypoglycemia or hyperglycemia. On SGLT2 and metformin  with good adherence and no side effects.  Had been on Victoza  in the past but medication was discontinued due to unspecified rash after 2 years of treatment.   Lab Results  Component Value Date   HGBA1C 6.2 12/19/2022   HGBA1C 7.4 09/15/2022   HGBA1C 6.5 (H) 04/04/2022   Lab Results  Component Value Date   MICROALBUR 10 02/02/2023   LDLCALC 130 09/15/2022   CREATININE 0.7 09/15/2022   We reviewed the carb insulin  model of obesity patient is definitely carb sensitive and would benefit from a low-carb moderate protein nutrition plan.  This will be implemented and further counseling will be provided at her new intake appointment.    Hyperlipidemia associated with type 2 diabetes mellitus (HCC) Assessment & Plan: LDL is not at goal. Elevated LDL may be secondary to nutrition, genetics and spillover effect from  excess adiposity. Recommended LDL goal is <70 to reduce the risk of fatty streaks and the progression to obstructive ASCVD in the future.   She is currently not on statin therapy might of had liver enzyme elevation in the past.  Her cardiovascular risk is likely higher than calculated due to the presence of diabetes and psoriatic arthritis.  Her 10 year risk is: The 10-year ASCVD risk score (Arnett DK, et al., 2019) is: 7.3%* (Cholesterol units were assumed)  Lab Results  Component Value Date   CHOL 189 12/19/2022   HDL 39 12/19/2022   LDLCALC 130 09/15/2022   LDLDIRECT 149 (H) 12/06/2011   TRIG 198 (A) 12/19/2022   CHOLHDL 2.4 04/04/2022   We will repeat fasting lipid profile with intake labs and assess cardiovascular risk.  We will also help guide decisions about lipid-lowering strategies.  Continue weight loss therapy, losing 10% or more of body weight may improve condition.  She would also benefit from reducing  saturated fats in diet to less than 10% of daily calories.              Obesity Treatment / Action Plan:  Patient will work on garnering support from family and friends to begin weight loss journey. Will work on eliminating or reducing the presence of highly palatable, calorie dense foods in the home. Will complete provided nutritional and psychosocial assessment questionnaire before the next appointment. Will be scheduled for indirect calorimetry to determine resting energy expenditure in a fasting state.  This will allow us  to create a reduced calorie, high-protein meal plan to promote loss of fat mass while preserving muscle mass. Counseled on the health benefits of losing 5%-15% of total body weight. Was counseled on nutritional approaches to weight loss and benefits of reducing processed foods and consuming plant-based foods and high quality protein as part of nutritional weight management. Was counseled on pharmacotherapy and role as an adjunct in weight  management.   Obesity Education Performed Today:  She was weighed on the bioimpedance scale and results were discussed and documented in the synopsis.  We discussed obesity as a disease and the importance of a more detailed evaluation of all the factors contributing to the disease.  We discussed the importance of long term lifestyle changes which include nutrition, exercise and behavioral modifications as well as the importance of customizing this to her specific health and social needs.  We discussed the benefits of reaching a healthier weight to alleviate the symptoms of existing conditions and reduce the risks of the biomechanical, metabolic and psychological effects of obesity.  ROSHELLE TRAUB appears to be in the action stage of change and states they are ready to start intensive lifestyle modifications and behavioral modifications.  30 minutes was spent today on this visit including the above counseling, pre-visit chart review, and post-visit documentation.  Reviewed by clinician on day of visit: allergies, medications, problem list, medical history, surgical history, family history, social history, and previous encounter notes pertinent to obesity diagnosis.   Lucas Parker, MD

## 2023-06-26 ENCOUNTER — Other Ambulatory Visit (HOSPITAL_COMMUNITY): Payer: Self-pay

## 2023-06-27 ENCOUNTER — Ambulatory Visit: Payer: Commercial Managed Care - PPO | Admitting: Family Medicine

## 2023-06-29 ENCOUNTER — Other Ambulatory Visit: Payer: Self-pay

## 2023-06-29 NOTE — Progress Notes (Signed)
Specialty Pharmacy Refill Coordination Note  Tabitha [name] is a 58 y.o. female contacted today regarding refills of specialty medication(s) Bimekizumab-bkzx (Bimzelx)   Patient requested [name] at [facility] at [place] date: 07/04/23   Medication will be filled on 07/02/23.  Patient is aware medication will need to be ordered and may be possible delays.

## 2023-06-29 NOTE — Progress Notes (Deleted)
  [name] [address] [place] [address] Phone: [phone] Subjective:    I'm seeing this patient by the request  of:  Everrett Coombe, DO  CC:   [id number]  Tabitha [name] is a 58 y.o. female coming in with complaint of back and neck pain. OMT on 05/24/2023. Patient states   Medications patient has been prescribed:   Taking:         Reviewed prior external information including notes and imaging from previsou exam, outside providers and external EMR if available.   As well as notes that were available from care everywhere and other healthcare systems.  Past medical history, social, surgical and family history all reviewed in electronic medical record.  No pertanent information unless stated regarding to the chief complaint.   Past Medical History:  Diagnosis Date   Arthritis    Asthma    De Quervain's disease (tenosynovitis)    Dermatophytosis, scalp    Diabetes mellitus    Hypercholesteremia    Obesity    Polycystic ovarian disease    Psoriatic arthritis (HCC)    Wears glasses     Allergies  Allergen Reactions   Chocolate Anaphylaxis   Food     chocolate   Statins     Intolerant  lft elevation   Latex Rash   Methotrexate Rash     Review of Systems:  No headache, visual changes, nausea, vomiting, diarrhea, constipation, dizziness, abdominal pain, skin rash, fevers, chills, night sweats, weight loss, swollen lymph nodes, body aches, joint swelling, chest pain, shortness of breath, mood changes. POSITIVE muscle aches  Objective  There were no vitals taken for this visit.   General: No apparent distress alert and oriented x3 mood and affect normal, dressed appropriately.  HEENT: Pupils equal, extraocular movements intact  Respiratory: Patient's speak in full sentences and does not appear short of breath  Cardiovascular: No lower extremity edema, non tender, no erythema  Gait MSK:  Back   Osteopathic  findings  C2 flexed rotated and side bent right C6 flexed rotated and side bent left T3 extended rotated and side bent right inhaled rib T9 extended rotated and side bent left L2 flexed rotated and side bent right Sacrum right on right       Assessment and Plan:  No problem-specific Assessment & Plan notes found for this encounter.    Nonallopathic problems  Decision today to treat with OMT was based on Physical Exam  After verbal consent patient was treated with HVLA, ME, FPR techniques in cervical, rib, thoracic, lumbar, and sacral  areas  Patient tolerated the procedure well with improvement in symptoms  Patient given exercises, stretches and lifestyle modifications  See medications in patient instructions if given  Patient will follow up in 4-8 weeks             Note: This dictation was prepared with Dragon dictation along with smaller phrase technology. Any transcriptional errors that result from this process are unintentional.

## 2023-07-02 ENCOUNTER — Other Ambulatory Visit: Payer: Self-pay

## 2023-07-03 ENCOUNTER — Ambulatory Visit: Payer: Commercial Managed Care - PPO | Admitting: Family Medicine

## 2023-07-04 ENCOUNTER — Other Ambulatory Visit: Payer: Self-pay | Admitting: Family Medicine

## 2023-07-04 ENCOUNTER — Ambulatory Visit (INDEPENDENT_AMBULATORY_CARE_PROVIDER_SITE_OTHER): Payer: Commercial Managed Care - PPO | Admitting: Family Medicine

## 2023-07-04 ENCOUNTER — Other Ambulatory Visit (HOSPITAL_COMMUNITY): Payer: Self-pay

## 2023-07-04 ENCOUNTER — Encounter: Payer: Self-pay | Admitting: Family Medicine

## 2023-07-04 ENCOUNTER — Other Ambulatory Visit: Payer: Self-pay

## 2023-07-04 VITALS — BP 131/72 | HR 85 | Ht 61.0 in | Wt 220.0 lb

## 2023-07-04 DIAGNOSIS — E0865 Diabetes mellitus due to underlying condition with hyperglycemia: Secondary | ICD-10-CM

## 2023-07-04 DIAGNOSIS — G4700 Insomnia, unspecified: Secondary | ICD-10-CM | POA: Insufficient documentation

## 2023-07-04 DIAGNOSIS — F5101 Primary insomnia: Secondary | ICD-10-CM | POA: Diagnosis not present

## 2023-07-04 DIAGNOSIS — L4050 Arthropathic psoriasis, unspecified: Secondary | ICD-10-CM

## 2023-07-04 DIAGNOSIS — E1169 Type 2 diabetes mellitus with other specified complication: Secondary | ICD-10-CM

## 2023-07-04 LAB — POCT GLYCOSYLATED HEMOGLOBIN (HGB A1C): HbA1c, POC (controlled diabetic range): 6.7 % (ref 0.0–7.0)

## 2023-07-04 MED ORDER — SYNJARDY XR 10-1000 MG PO TB24
1.0000 | ORAL_TABLET | Freq: Every day | ORAL | 4 refills | Status: DC
  Filled 2023-07-04: qty 90, 90d supply, fill #0
  Filled 2023-10-05: qty 90, 90d supply, fill #1

## 2023-07-04 MED ORDER — TRAZODONE HCL 50 MG PO TABS
50.0000 mg | ORAL_TABLET | Freq: Every day | ORAL | 1 refills | Status: DC
  Filled 2023-07-04: qty 90, 45d supply, fill #0
  Filled 2023-09-07: qty 90, 45d supply, fill #1

## 2023-07-04 NOTE — Progress Notes (Signed)
Tabitha [name] - 58 y.o. female MRN [id number]  Date of birth: December 20, 1965  Subjective Chief Complaint  Patient presents with   Weight Loss   Rash    HPI Tabitha [name] is a 58 y.o. female here today for follow up visit.   She had appt with healthy weight and wellness a couple of weeks ago.  It appears that dietary changes and increased activity were discussed.  She remains on SGLT-2 and metformin for management of diabetes.  They did discuss possibly adding a GLP-1 to help with glucose and weight management.  Her A1c is increased some today at 6.7%.   She has had increased rash and itching in the L ear.  She has history of psoriasis and is in the process of changing biologics.  Thinks this may be a flare from this.  She does have clobetasol solution she can use.    She is having some difficulty with sleep.  She is using magnesium supplement already.  She has tried melatonin in the past but this has not really worked.  She would be interested in trial of prescription option.  ROS:  A comprehensive ROS was completed and negative except as noted per HPI  Allergies  Allergen Reactions   Chocolate Anaphylaxis   Food     chocolate   Latex Rash   Methotrexate Rash    Past Medical History:  Diagnosis Date   Arthritis    Asthma    De Quervain's disease (tenosynovitis)    Dermatophytosis, scalp    Diabetes mellitus    Hypercholesteremia    Obesity    Polycystic ovarian disease    Psoriatic arthritis (HCC)    Wears glasses     Past Surgical History:  Procedure Laterality Date   BREAST BIOPSY Right    CARPAL TUNNEL RELEASE     COLONOSCOPY     DE QUERVAIN'S RELEASE  2012   left   KNEE ARTHROPLASTY     KNEE ARTHROSCOPY Right 05/06/2013   Procedure: RIGHT KNEE ARTHROSCOPY ;  Surgeon: Velna Ochs, MD;  Location: [facility];  Service: Orthopedics;  Laterality: Right;  partial lateral minisectomy and chondroplasty   WISDOM TOOTH EXTRACTION     WRIST SURGERY  2001    carpel tunnel -rt    Social History   Socioeconomic History   Marital status: Married    Spouse name: Not on file   Number of children: Not on file   Years of education: Not on file   Highest education level: Master's degree (e.g., MA, MS, MEng, MEd, MSW, MBA)  Occupational History   Occupation: Academic librarian: [employer]    Comment: at wom[facility] hospital  Tobacco Use   Smoking status: Former    Current packs/day: 0.00    Average packs/day: 1.3 packs/day for 12.0 years (15.6 ttl pk-yrs)    Types: Cigarettes    Start date: 06/12/1984    Quit date: 03/18/1995    Years since quitting: 28.3   Smokeless tobacco: Never   Tobacco comments:    Not interested in returning to smoking.   Vaping Use   Vaping status: Never Used  Substance and Sexual Activity   Alcohol use: Yes    Comment: rarely   Drug use: No   Sexual activity: Yes    Partners: Male    Comment: married  Other Topics Concern   Not on file  Social History Narrative   Not on file   Social  Drivers of Health   Financial Resource Strain: Low Risk  (07/01/2023)   Overall Financial Resource Strain (CARDIA)    Difficulty of Paying Living Expenses: Not hard at all  Food Insecurity: No Food Insecurity (07/01/2023)   Hunger Vital Sign    Worried About Running Out of Food in the Last Year: Never true    Ran Out of Food in the Last Year: Never true  Transportation Needs: No Transportation Needs (07/01/2023)   PRAPARE - Administrator, Civil Service (Medical): No    Lack of Transportation (Non-Medical): No  Physical Activity: Sufficiently Active (07/01/2023)   Exercise Vital Sign    Days of Exercise per Week: 3 days    Minutes of Exercise per Session: 60 min  Stress: Stress Concern Present (07/01/2023)   [employer] of Occupational Health - Occupational Stress Questionnaire    Feeling of Stress : To some extent  Social Connections: Moderately Integrated (07/01/2023)   Social Connection and Isolation  Panel [NHANES]    Frequency of Communication with Friends and Family: More than three times a week    Frequency of Social Gatherings with Friends and Family: Once a week    Attends Religious Services: 1 to 4 times per year    Active Member of [employer] or Organizations: No    Attends Engineer, structural: Not on file    Marital Status: Married    Family History  Problem Relation Age of Onset   Diabetes Father    Hypertension Father    Stroke Father    Kidney disease Father    Heart disease Father    Cancer Mother 49       breast ca died at age 34   Breast cancer Mother 87   Hypertension Other    Diabetes type II Other    Obesity Other    Obesity Brother    Hypertension Brother    Heart disease Maternal Grandmother    Alzheimer's disease Maternal Grandmother    Diabetes Paternal Grandmother    Heart disease Paternal Grandfather     Health Maintenance  Topic Date Due   Hepatitis C Screening  Never done   COVID-19 Vaccine (8 - 2024-25 season) 02/11/2023   Cervical Cancer Screening (HPV/Pap Cotest)  08/13/2023   Diabetic kidney evaluation - eGFR measurement  12/19/2023   HEMOGLOBIN A1C  01/01/2024   Diabetic kidney evaluation - Urine ACR  02/02/2024   FOOT EXAM  02/02/2024   OPHTHALMOLOGY EXAM  06/17/2024   MAMMOGRAM  07/12/2024   DTaP/Tdap/Td (3 - Td or Tdap) 01/06/2032   Colonoscopy  05/29/2032   Pneumococcal Vaccine 13-67 Years old  Completed   INFLUENZA VACCINE  Completed   HIV Screening  Completed   Zoster Vaccines- Shingrix  Completed   HPV VACCINES  Aged Out     ----------------------------------------------------------------------------------------------------------------------------------------------------------------------------------------------------------------- Physical Exam BP 131/72 (BP Location: Left Arm, Patient Position: Sitting, Cuff Size: Large)   Pulse 85   Ht 5\' 1"  (1.549 m)   Wt 220 lb (99.8 kg)   SpO2 99%   BMI 41.57 kg/m    Physical Exam Constitutional:      Appearance: Normal appearance.  Eyes:     General: No scleral icterus. Cardiovascular:     Rate and Rhythm: Normal rate and regular rhythm.  Pulmonary:     Effort: Pulmonary effort is normal.     Breath sounds: Normal breath sounds.  Musculoskeletal:     Cervical back: Neck supple.  Neurological:  Mental Status: She is alert.  Psychiatric:        Mood and Affect: Mood normal.        Behavior: Behavior normal.     ------------------------------------------------------------------------------------------------------------------------------------------------------------------------------------------------------------------- Assessment and Plan  Diabetes mellitus due to underlying condition with hyperglycemia (HCC) Blood sugars are worsened slightly since last check.  She will continue on Synjardy.  We did discuss GLP-1 medications and she would prefer to wait until she meets with healthy weight and wellness again and has full set of labs.  Psoriatic arthritis (HCC) Recently switched biologic and is having increased flare of the ear.  She will use clobetasol solution on the ear.   Insomnia Adding trazodone at bedtime.   Meds ordered this encounter  Medications   traZODone (DESYREL) 50 MG tablet    Sig: Take 1-2 tablets (50-100 mg total) by mouth at bedtime.    Dispense:  90 tablet    Refill:  1    Return in about 6 months (around 01/01/2024) for Type 2 Diabetes.    This visit occurred during the SARS-CoV-2 public health emergency.  Safety protocols were in place, including screening questions prior to the visit, additional usage of staff PPE, and extensive cleaning of exam room while observing appropriate contact time as indicated for disinfecting solutions.

## 2023-07-04 NOTE — Assessment & Plan Note (Signed)
Adding trazodone at bedtime.

## 2023-07-04 NOTE — Assessment & Plan Note (Signed)
Recently switched biologic and is having increased flare of the ear.  She will use clobetasol solution on the ear.

## 2023-07-04 NOTE — Assessment & Plan Note (Signed)
Blood sugars are worsened slightly since last check.  She will continue on Synjardy.  We did discuss GLP-1 medications and she would prefer to wait until she meets with healthy weight and wellness again and has full set of labs.

## 2023-07-06 NOTE — Progress Notes (Unsigned)
[name] [address] [place] [address] Phone: [phone] Subjective:   Tabitha [name], am serving as a scribe for Dr. Antoine Primas.  I'm seeing this patient by the request  of:  Everrett Coombe, DO  CC: back and neck pain follo wup   [id number]  Tabitha [name] is a 58 y.o. female coming in with complaint of back and neck pain. OMT 05/24/2023. Also R shoulder pain f/u. Patient states that she is in more pain in general. Feels as if R shoulder is going down path of L shoulder as if it feels similar.   Medications patient has been prescribed: None  Taking:       Reviewed prior external information including notes and imaging from previsou exam, outside providers and external EMR if available.   As well as notes that were available from care everywhere and other healthcare systems.  Past medical history, social, surgical and family history all reviewed in electronic medical record.  No pertanent information unless stated regarding to the chief complaint.   Past Medical History:  Diagnosis Date   Arthritis    Asthma    De Quervain's disease (tenosynovitis)    Dermatophytosis, scalp    Diabetes mellitus    Hypercholesteremia    Obesity    Polycystic ovarian disease    Psoriatic arthritis (HCC)    Wears glasses     Allergies  Allergen Reactions   Chocolate Anaphylaxis   Food     chocolate   Latex Rash   Methotrexate Rash     Review of Systems:  No headache, visual changes, nausea, vomiting, diarrhea, constipation, dizziness, abdominal pain, skin rash, fevers, chills, night sweats, weight loss, swollen lymph nodes, body aches, joint swelling, chest pain, shortness of breath, mood changes. POSITIVE muscle aches  Objective  Blood pressure 128/80, pulse 75, height 5\' 1"  (1.549 m), SpO2 98%.   General: No apparent distress alert and oriented x3 mood and affect normal, dressed appropriately.  HEENT: Pupils equal, extraocular  movements intact  Respiratory: Patient's speak in full sentences and does not appear short of breath  Cardiovascular: No lower extremity edema, non tender, no erythema  MSK:  Back does have some loss lordosis noted.  Severe tenderness in multiple joints.  Patient's shoulder multiple different difficulties within movement.  Osteopathic findings  C3 flexed rotated and side bent right C4 flexed rotated and side bent right T3 extended rotated and side bent right inhaled rib T4 extended rotated and side bent right L2 flexed rotated and side bent right Sacrum right on right  Limited muscular skeletal ultrasound was performed and interpreted by Antoine Primas, M  Limited ultrasound does have some erosive changes noted it appears of the shoulder itself.  Some hypoechoic changes consistent with more of a synovitis of the shoulder.  Rotator cuff some increasing Doppler flow but no specific tearing noted.     Assessment and Plan:  Psoriatic arthritis (HCC) Unfortunately I do believe the patient is having a flare.  No longer responding to the autoimmune and is in the third dose of any medication that I do not think is quite contributing.  Toradol and Depo-Medrol injections given today tempted osteopathic evaluation with some improvement.  I did not feel that a intra-articular injection would be significantly beneficial for this in the monitor.  Follow-up again in 6 to 8 weeks    Nonallopathic problems  Decision today to treat with OMT was based on Physical Exam  After  verbal consent patient was treated with HVLA, ME, FPR techniques in cervical, rib, thoracic, lumbar, and sacral  areas  Patient tolerated the procedure well with improvement in symptoms  Patient given exercises, stretches and lifestyle modifications  See medications in patient instructions if given  Patient will follow up in 4-8 weeks    The above documentation has been reviewed and is accurate and complete [name],  DO          Note: This dictation was prepared with Dragon dictation along with smaller phrase technology. Any transcriptional errors that result from this process are unintentional.

## 2023-07-09 ENCOUNTER — Other Ambulatory Visit (HOSPITAL_COMMUNITY): Payer: Self-pay

## 2023-07-09 ENCOUNTER — Other Ambulatory Visit: Payer: Self-pay

## 2023-07-09 ENCOUNTER — Ambulatory Visit (INDEPENDENT_AMBULATORY_CARE_PROVIDER_SITE_OTHER): Payer: Commercial Managed Care - PPO | Admitting: Family Medicine

## 2023-07-09 ENCOUNTER — Encounter: Payer: Self-pay | Admitting: Family Medicine

## 2023-07-09 VITALS — BP 128/80 | HR 75 | Ht 61.0 in

## 2023-07-09 DIAGNOSIS — M9903 Segmental and somatic dysfunction of lumbar region: Secondary | ICD-10-CM

## 2023-07-09 DIAGNOSIS — L4050 Arthropathic psoriasis, unspecified: Secondary | ICD-10-CM

## 2023-07-09 DIAGNOSIS — M9901 Segmental and somatic dysfunction of cervical region: Secondary | ICD-10-CM | POA: Diagnosis not present

## 2023-07-09 DIAGNOSIS — M9908 Segmental and somatic dysfunction of rib cage: Secondary | ICD-10-CM

## 2023-07-09 DIAGNOSIS — M9904 Segmental and somatic dysfunction of sacral region: Secondary | ICD-10-CM

## 2023-07-09 DIAGNOSIS — M25511 Pain in right shoulder: Secondary | ICD-10-CM | POA: Diagnosis not present

## 2023-07-09 DIAGNOSIS — M9902 Segmental and somatic dysfunction of thoracic region: Secondary | ICD-10-CM | POA: Diagnosis not present

## 2023-07-09 MED ORDER — METHYLPREDNISOLONE ACETATE 80 MG/ML IJ SUSP
80.0000 mg | Freq: Once | INTRAMUSCULAR | Status: AC
  Administered 2023-07-09: 80 mg via INTRAMUSCULAR

## 2023-07-09 MED ORDER — KETOROLAC TROMETHAMINE 60 MG/2ML IM SOLN
60.0000 mg | Freq: Once | INTRAMUSCULAR | Status: AC
  Administered 2023-07-09: 60 mg via INTRAMUSCULAR

## 2023-07-09 MED ORDER — LISDEXAMFETAMINE DIMESYLATE 60 MG PO CAPS
60.0000 mg | ORAL_CAPSULE | Freq: Every day | ORAL | 0 refills | Status: DC
  Filled 2023-07-09: qty 30, 30d supply, fill #0

## 2023-07-09 NOTE — Assessment & Plan Note (Signed)
Unfortunately I do believe the patient is having a flare.  No longer responding to the autoimmune and is in the third dose of any medication that I do not think is quite contributing.  Toradol and Depo-Medrol injections given today tempted osteopathic evaluation with some improvement.  I did not feel that a intra-articular injection would be significantly beneficial for this in the monitor.  Follow-up again in 6 to 8 weeks

## 2023-07-09 NOTE — Patient Instructions (Signed)
Injections in backside See me again in 5 weeks

## 2023-07-12 ENCOUNTER — Encounter (INDEPENDENT_AMBULATORY_CARE_PROVIDER_SITE_OTHER): Payer: Self-pay | Admitting: Internal Medicine

## 2023-07-12 ENCOUNTER — Ambulatory Visit (INDEPENDENT_AMBULATORY_CARE_PROVIDER_SITE_OTHER): Payer: Commercial Managed Care - PPO | Admitting: Internal Medicine

## 2023-07-12 VITALS — BP 134/82 | Temp 98.1°F | Ht 61.0 in | Wt 218.0 lb

## 2023-07-12 DIAGNOSIS — R5383 Other fatigue: Secondary | ICD-10-CM

## 2023-07-12 DIAGNOSIS — Z1331 Encounter for screening for depression: Secondary | ICD-10-CM | POA: Diagnosis not present

## 2023-07-12 DIAGNOSIS — R0602 Shortness of breath: Secondary | ICD-10-CM

## 2023-07-12 DIAGNOSIS — E1169 Type 2 diabetes mellitus with other specified complication: Secondary | ICD-10-CM

## 2023-07-12 DIAGNOSIS — E66813 Obesity, class 3: Secondary | ICD-10-CM

## 2023-07-12 DIAGNOSIS — E785 Hyperlipidemia, unspecified: Secondary | ICD-10-CM

## 2023-07-12 DIAGNOSIS — R638 Other symptoms and signs concerning food and fluid intake: Secondary | ICD-10-CM | POA: Diagnosis not present

## 2023-07-12 DIAGNOSIS — E0865 Diabetes mellitus due to underlying condition with hyperglycemia: Secondary | ICD-10-CM

## 2023-07-12 DIAGNOSIS — Z6841 Body Mass Index (BMI) 40.0 and over, adult: Secondary | ICD-10-CM

## 2023-07-12 DIAGNOSIS — G4733 Obstructive sleep apnea (adult) (pediatric): Secondary | ICD-10-CM | POA: Diagnosis not present

## 2023-07-12 DIAGNOSIS — Z7985 Long-term (current) use of injectable non-insulin antidiabetic drugs: Secondary | ICD-10-CM

## 2023-07-12 DIAGNOSIS — E1165 Type 2 diabetes mellitus with hyperglycemia: Secondary | ICD-10-CM

## 2023-07-12 LAB — HEPATIC FUNCTION PANEL
ALT: 33 U/L (ref 7–35)
AST: 23 (ref 13–35)
Alkaline Phosphatase: 99 (ref 25–125)
Bilirubin, Total: 0.3

## 2023-07-12 LAB — CBC: RBC: 5.09 (ref 3.87–5.11)

## 2023-07-12 LAB — HEMOGLOBIN A1C: Hemoglobin A1C: 6.7

## 2023-07-12 LAB — BASIC METABOLIC PANEL
BUN: 15 (ref 4–21)
CO2: 22 (ref 13–22)
Chloride: 100 (ref 99–108)
Creatinine: 0.6 (ref 0.5–1.1)
Glucose: 165
Potassium: 4.5 meq/L (ref 3.5–5.1)
Sodium: 139 (ref 137–147)

## 2023-07-12 LAB — COMPREHENSIVE METABOLIC PANEL
Albumin: 4.5 (ref 3.5–5.0)
Calcium: 9.6 (ref 8.7–10.7)
Globulin: 2.6
eGFR: 103

## 2023-07-12 LAB — CBC AND DIFFERENTIAL
HCT: 46 (ref 36–46)
Hemoglobin: 15.3 (ref 12.0–16.0)
Platelets: 183 10*3/uL (ref 150–400)
WBC: 6.6

## 2023-07-12 NOTE — Assessment & Plan Note (Signed)
See obesity treatment plan

## 2023-07-12 NOTE — Assessment & Plan Note (Signed)
According to records she had been on a statin previously and had elevated liver enzymes.  We will check fasting lipid profile assess cardiovascular risk and counsel patient accordingly.  It may be worthwhile rechallenging her with a low-dose statin.

## 2023-07-12 NOTE — Progress Notes (Signed)
Office: [phone]  /  Fax: [phone]   Subjective   Initial Visit  Tabitha [name] (MR# [id number]) is an 58 y.o. female who presents for evaluation and treatment of obesity and related comorbidities. Current BMI is Body mass index is 41.19 kg/m. Tabitha [name] has been struggling with her weight for many years and has been unsuccessful in either losing weight, maintaining weight loss, or reaching her healthy weight goal.  Tabitha [name] is currently in the action stage of change and ready to dedicate time achieving and maintaining a healthier weight. Tabitha [name] is interested in becoming our patient and working on intensive lifestyle modifications including (but not limited to) diet and exercise for weight loss.  She was referred by: Specialist EP for AFIB   When asked what else they would like to accomplish? She states: Improve energy levels and physical activity, Improve existing medical conditions, Reduce number of medications, Improve quality of life, and Improve self-confidence   Weight history: Since young, gained more weight in 2016 due to personal problems.   When asked how has your weight affected you? She states: Contributed to medical problems, Contributed to orthopedic problems or mobility issues, Having fatigue, Having poor endurance, and Problems with eating patterns   Some associated conditions: Hypertension, Hyperlipidemia, OSA, and Diabetes   Contributing factors: Family history of obesity, Disruption of circadian rhythm / sleep disordered breathing, Reduced physical activity, Eating patterns, and Menopause    Weight history:  She starting to note weight gain during : childhood and adulthood.  Life events associated with weight gain include :  personal problems .   Other contributing factors: Family history of obesity, Disruption of circadian rhythm / sleep disordered breathing, Moderate to high levels of stress, and Menopause.  Their highest weight has been:  280 lbs.  Desired  weight: 10-15%  Previous weight-loss programs : Weight Watchers.  Their maximum weight loss was:  6 lbs.  Their greatest challenge with dieting: dieting fatigue.  Current or previous pharmacotherapy: GLP-1, Phentermine, and Other: victoza or methotrexate might have caused rash .  Response to medication:  Not long enough  Nutritional History:  Current nutrition plan: None.  How many times do you eat outside the home: 2-4 per week  How often do they skip meals: does not skip meals  What beverages do they drink: caffeinated beverages 5-7 per week and unsweetened tea > 7 per week.   Use of artificial sweetners : Yes  Food intolerances or dislikes: gluten.  Food triggers: Stress, Boredom, Insomnia, Seeking reward, Help stay awake, and To help comfort self.  Food cravings: Starches / Carbohydrates  Do they struggle with excessive hunger or portion control : Yes   Current level of physical activity: Walking 60-90 minutes and Strength training 90-120 minutes  Past medical history includes:   Past Medical History:  Diagnosis Date   A-fib (HCC)    Alcohol abuse    Anemia    Arthritis    Asthma    Back pain    De Quervain's disease (tenosynovitis)    Dermatophytosis, scalp    Diabetes mellitus    Drug use    Edema of both lower extremities    Fatty liver    Female infertility    Gallbladder problem    Hypercholesteremia    Joint pain    Obesity    Osteoarthritis    Palpitations    PCOS (polycystic ovarian syndrome)    Polycystic ovarian disease    Psoriatic arthritis (HCC)  SOB (shortness of breath)    Vitamin D deficiency    Wears glasses      Objective   BP 134/82   Temp 98.1 F (36.7 C)   Ht 5\' 1"  (1.549 m)   Wt 218 lb (98.9 kg)   BMI 41.19 kg/m  She was weighed on the bioimpedance scale: Body mass index is 41.19 kg/m.    Anthropometrics:  Vitals Temp: 98.1 F (36.7 C) BP: 134/82   Anthropometric Measurements Height: 5\' 1"  (1.549  m) Weight: 218 lb (98.9 kg) BMI (Calculated): 41.21 Starting Weight: 218 lb Peak Weight: 280 lb Waist Measurement : 51 inches   Body Composition  Body Fat %: 46.3 % Fat Mass (lbs): 101.2 lbs Muscle Mass (lbs): 111.2 lbs Total Body Water (lbs): 76 lbs Visceral Fat Rating : 15   Other Clinical Data RMR: 1886 Fasting: Yes Labs: Yes Today's Visit #: 1 Starting Date: 07/11/22    Physical Exam:  General: She is overweight, cooperative, alert, well developed, and in no acute distress. PSYCH: Has normal mood, affect and thought process.   HEENT: EOMI, sclerae are anicteric. Lungs: Normal breathing effort, no conversational dyspnea. Extremities: No edema.  Neurologic: No gross sensory or motor deficits. No tremors or fasciculations noted.    Diagnostic Data Reviewed  EKG: Normal sinus rhythm, rate 63. No conduction abnormalities, abnormal Q waves or chamber enlargement.  Indirect Calorimeter completed today shows a VO2 of 273 and a REE of 1886.  Her calculated basal metabolic rate is  1645 thus her resting energy expenditure faster than calculated.  Depression Screen  Tabitha [name]'s PHQ-9 score was: 0.     07/04/2023    8:31 AM  Depression screen PHQ 2/9  Decreased Interest 0  Down, Depressed, Hopeless 0  PHQ - 2 Score 0    Screening for Sleep Related Breathing Disorders  Tabitha [name] admits to daytime somnolence and admits to waking up still tired. Patient denies a history of symptoms of OSA symptoms.  Tabitha [name] generally gets  6.5 to 8  hours of sleep per night, and states that she has poor sleep quality. Snoring is present. Apneic episodes are present. Epworth Sleepiness Score is 0.   BMET    Component Value Date/Time   NA 138 09/15/2022 0000   K 4.4 09/15/2022 0000   CL 102 09/15/2022 0000   CO2 23 (A) 09/15/2022 0000   GLUCOSE 140 (H) 04/04/2022 0000   BUN 14 09/15/2022 0000   CREATININE 0.7 09/15/2022 0000   CREATININE 0.72 04/04/2022 0000   CALCIUM 9.9 09/15/2022  0000   GFRNONAA 98 08/21/2019 0834   GFRAA 114 08/21/2019 0834   Lab Results  Component Value Date   HGBA1C 6.7 07/04/2023   HGBA1C 7.2 09/06/2011   No results found for: "INSULIN" CBC    Component Value Date/Time   WBC 6.4 09/15/2022 0000   WBC 7.9 04/04/2022 0000   RBC 5.02 09/15/2022 0000   HGB 15.1 09/15/2022 0000   HCT 46 09/15/2022 0000   PLT 190 09/15/2022 0000   MCV 91.1 04/04/2022 0000   MCH 30.6 04/04/2022 0000   MCHC 33.5 04/04/2022 0000   RDW 11.8 04/04/2022 0000   Iron/TIBC/Ferritin/ %Sat    Component Value Date/Time   FERRITIN 71 09/15/2022 0000   Lipid Panel     Component Value Date/Time   CHOL 189 12/19/2022 0000   TRIG 198 (A) 12/19/2022 0000   HDL 39 12/19/2022 0000   CHOLHDL 2.4 04/04/2022 0000  VLDL 25.0 09/25/2019 0920   LDLCALC 130 09/15/2022 0000   LDLCALC 87 04/04/2022 0000   LDLDIRECT 149 (H) 12/06/2011 1702   Hepatic Function Panel     Component Value Date/Time   PROT 7.0 04/04/2022 0000   ALBUMIN 4.2 09/15/2022 0000   AST 20 09/15/2022 0000   ALT 42 (A) 09/15/2022 0000   ALKPHOS 98 09/15/2022 0000   BILITOT 0.5 04/04/2022 0000      Component Value Date/Time   TSH 1.23 09/15/2022 0000   TSH 1.02 12/11/2018 1102     Assessment and Plan   TREATMENT PLAN FOR OBESITY:  Recommended Dietary Goals  Tabitha [name] is currently in the action stage of change. As such, her goal is to implement medically supervised weight loss plan.  She has agreed to implement: the Category 2 plan - 1200 kcal per day  Behavioral Intervention  We discussed the following Behavioral Modification Strategies today: increasing lean protein intake to established goals, decreasing simple carbohydrates , increasing vegetables, increasing lower glycemic fruits, increasing fiber rich foods, avoiding skipping meals, increasing water intake, work on meal planning and preparation, reading food labels , keeping healthy foods at home, identifying sources and decreasing  liquid calories, decreasing eating out or consumption of processed foods, and making healthy choices when eating convenient foods, planning for success, and better snacking choices  Additional resources provided today: Handout on healthy eating and balanced plate, Handout on complex carbohydrates and lean sources of protein, and Category 2 packet  Recommended Physical Activity Goals  Tabitha [name] has been advised to work up to 150 minutes of moderate intensity aerobic activity a week and strengthening exercises 2-3 times per week for cardiovascular health, weight loss maintenance and preservation of muscle mass.   She has agreed to :  Think about enjoyable ways to increase daily physical activity and overcoming barriers to exercise and Increase physical activity in their day and reduce sedentary time (increase NEAT).  Pharmacotherapy We will work on building a Therapist, art and behavioral strategies. We will discuss the role of pharmacotherapy as an adjunct at subsequent visits.   ASSOCIATED CONDITIONS ADDRESSED TODAY  Other Fatigue Tabitha [name] admits to daytime somnolence and admits to waking up still tired. Patient denies a history of symptoms of OSA symptoms.  Tabitha [name] generally gets 6.5 to 8 hours of sleep per night, and states that she has poor sleep quality. Snoring is present. Apneic episodes are present. Epworth Sleepiness Score is 0. Tabitha [name] does feel that her weight is causing her energy to be lower than it should be. Fatigue may be related to obesity, depression or many other causes. Labs will be ordered, and in the meanwhile, Tabitha [name] will focus on self care including making healthy food choices, increasing physical activity and focusing on stress reduction.  Shortness of Breath Tabitha [name] notes increasing shortness of breath with exercising and seems to be worsening over time with weight gain. She notes getting out of breath sooner with activity than she used to. This has not  gotten worse recently. Tabitha [name] denies shortness of breath at rest or orthopnea.Tabitha [name] notes increasing shortness of breath with exercising and seems to be worsening over time with weight gain. She notes getting out of breath sooner with activity than she used to. This has not gotten worse recently. Tabitha [name] denies shortness of breath at rest or orthopnea.  Other fatigue -     Vitamin B12 -     TSH  SOB (  shortness of breath) on exertion  Depression screen  Abnormal food appetite Assessment & Plan: She has increased orexigenic signaling, impaired satiety and inhibitory control. This is secondary to an abnormal energy regulation system and pathological neurohormonal pathways characteristic of excess adiposity.  She may also have insulin resistance and leptin resistance.  We will check leptin levels along with insulin.  In addition to nutritional and behavioral strategies she may benefit from pharmacotherapy.    Orders: -     Leptin, Serum  OSA (obstructive sleep apnea) Assessment & Plan: On CPAP with reported good compliance. Continue PAP therapy. Losing 15% or more of body weight may improve AHI.      Class 3 severe obesity with serious comorbidity and body mass index (BMI) of 40.0 to 44.9 in adult, unspecified obesity type [facility]) Assessment & Plan: See obesity treatment plan  Orders: -     VITAMIN D 25 Hydroxy (Vit-D Deficiency, Fractures) -     Leptin, Serum  Diabetes mellitus due to underlying condition with hyperglycemia, without long-term current use of insulin (HCC) Assessment & Plan: Her most recent A1c is 6.7 she is currently on Synjardy without any adverse effects.  She had been on Victoza in the past but experienced a rash also while taking methotrexate it was felt the methotrexate was likely the cause of the rash.  She is interested in trying [facility] which I think she would be a good candidate for considering degree of obesity and associated high risk  comorbidities.  Orders: -     CBC with Differential/Platelet -     Comprehensive metabolic panel -     Insulin, random -     Lipid Panel With LDL/HDL Ratio  Hyperlipidemia associated with type 2 diabetes mellitus (HCC) Assessment & Plan: According to records she had been on a statin previously and had elevated liver enzymes.  We will check fasting lipid profile assess cardiovascular risk and counsel patient accordingly.  It may be worthwhile rechallenging her with a low-dose statin.     Follow-up  She was informed of the importance of frequent follow-up visits to maximize her success with intensive lifestyle modifications for her multiple health conditions. She was informed we would discuss her lab results at her next visit unless there is a critical issue that needs to be addressed sooner. Tabitha [name] agreed to keep her next visit at the agreed upon time to discuss these results.  Attestation Statement  This is the patient's intake visit at [facility]. The patient's Health Questionnaire was reviewed at length. Included in the packet: current and past health history, medications, allergies, ROS, gynecologic history (women only), surgical history, family history, social history, weight history, weight loss surgery history (for those that have had weight loss surgery), nutritional evaluation, mood and food questionnaire, PHQ9, Epworth questionnaire, sleep habits questionnaire, patient life and health improvement goals questionnaire. These will all be scanned into the patient's chart under media.   During the visit, I independently reviewed the patient's, previous labs, bioimpedance scale results, and indirect calorimetry results. I used this information to medically tailor a meal plan for the patient that will help her to lose weight and will improve her obesity-related conditions. I performed a medically necessary appropriate examination and/or evaluation. I discussed the assessment  and treatment plan with the patient. The patient was provided an opportunity to ask questions and all were answered. The patient agreed with the plan and demonstrated an understanding of the instructions. Labs were ordered at this  visit and will be reviewed at the next visit unless critical results need to be addressed immediately. Clinical information was updated and documented in the EMR.   In addition, they received basic education on identification of processed foods and reduction of these, different sources of lean proteins and complex carbohydrates and how to eat balanced by incorporation of whole foods.  Reviewed by clinician on day of visit: allergies, medications, problem list, medical history, surgical history, family history, social history, and previous encounter notes.  I have spent 60 minutes in the care of the patient today including: preparing to see patient (e.g. review and interpretation of tests, old notes ), obtaining and/or reviewing separately obtained history, performing a medically appropriate examination or evaluation, counseling and educating the patient, ordering medications, test or procedures, documenting clinical information in the electronic or other health care record, and independently interpreting results and communicating results to the patient, family, or caregiver      Worthy Rancher, MD

## 2023-07-12 NOTE — Assessment & Plan Note (Signed)
On CPAP with reported good compliance. Continue PAP therapy. Losing 15% or more of body weight may improve AHI.

## 2023-07-12 NOTE — Assessment & Plan Note (Signed)
Her most recent A1c is 6.7 she is currently on Synjardy without any adverse effects.  She had been on Victoza in the past but experienced a rash also while taking methotrexate it was felt the methotrexate was likely the cause of the rash.  She is interested in trying [facility] which I think she would be a good candidate for considering degree of obesity and associated high risk comorbidities.

## 2023-07-12 NOTE — Assessment & Plan Note (Signed)
She has increased orexigenic signaling, impaired satiety and inhibitory control. This is secondary to an abnormal energy regulation system and pathological neurohormonal pathways characteristic of excess adiposity.  She may also have insulin resistance and leptin resistance.  We will check leptin levels along with insulin.  In addition to nutritional and behavioral strategies she may benefit from pharmacotherapy.

## 2023-07-13 ENCOUNTER — Ambulatory Visit: Payer: Commercial Managed Care - PPO | Admitting: Family Medicine

## 2023-07-20 ENCOUNTER — Other Ambulatory Visit (HOSPITAL_COMMUNITY)
Admission: RE | Admit: 2023-07-20 | Discharge: 2023-07-20 | Disposition: A | Discharge status: Home / Self Care | Payer: Self-pay | Source: Ambulatory Visit | Attending: Medical Genetics | Admitting: Medical Genetics

## 2023-07-20 ENCOUNTER — Other Ambulatory Visit (HOSPITAL_COMMUNITY): Payer: Self-pay

## 2023-07-23 ENCOUNTER — Other Ambulatory Visit: Payer: Self-pay

## 2023-07-23 ENCOUNTER — Encounter (INDEPENDENT_AMBULATORY_CARE_PROVIDER_SITE_OTHER): Payer: Self-pay | Admitting: Internal Medicine

## 2023-07-25 ENCOUNTER — Other Ambulatory Visit (HOSPITAL_COMMUNITY): Payer: Self-pay

## 2023-07-25 DIAGNOSIS — L4050 Arthropathic psoriasis, unspecified: Secondary | ICD-10-CM | POA: Diagnosis not present

## 2023-07-25 DIAGNOSIS — Z7960 Long term (current) use of unspecified immunomodulators and immunosuppressants: Secondary | ICD-10-CM | POA: Diagnosis not present

## 2023-07-25 DIAGNOSIS — L409 Psoriasis, unspecified: Secondary | ICD-10-CM | POA: Diagnosis not present

## 2023-07-25 DIAGNOSIS — M150 Primary generalized (osteo)arthritis: Secondary | ICD-10-CM | POA: Diagnosis not present

## 2023-07-25 MED ORDER — CELECOXIB 100 MG PO CAPS
100.0000 mg | ORAL_CAPSULE | Freq: Two times a day (BID) | ORAL | 3 refills | Status: AC
  Filled 2023-07-25: qty 60, 30d supply, fill #0
  Filled 2024-01-11: qty 60, 30d supply, fill #1
  Filled 2024-05-16: qty 60, 30d supply, fill #2

## 2023-07-26 ENCOUNTER — Encounter (INDEPENDENT_AMBULATORY_CARE_PROVIDER_SITE_OTHER): Payer: Self-pay | Admitting: Internal Medicine

## 2023-07-26 ENCOUNTER — Other Ambulatory Visit: Payer: Self-pay

## 2023-07-26 ENCOUNTER — Ambulatory Visit (INDEPENDENT_AMBULATORY_CARE_PROVIDER_SITE_OTHER): Payer: BC Managed Care – PPO | Admitting: Internal Medicine

## 2023-07-26 VITALS — BP 130/75 | HR 90 | Temp 98.4°F | Ht 61.0 in | Wt 214.0 lb

## 2023-07-26 DIAGNOSIS — Z7985 Long-term (current) use of injectable non-insulin antidiabetic drugs: Secondary | ICD-10-CM

## 2023-07-26 DIAGNOSIS — E785 Hyperlipidemia, unspecified: Secondary | ICD-10-CM

## 2023-07-26 DIAGNOSIS — E1169 Type 2 diabetes mellitus with other specified complication: Secondary | ICD-10-CM

## 2023-07-26 DIAGNOSIS — Z6841 Body Mass Index (BMI) 40.0 and over, adult: Secondary | ICD-10-CM | POA: Diagnosis not present

## 2023-07-26 DIAGNOSIS — E119 Type 2 diabetes mellitus without complications: Secondary | ICD-10-CM

## 2023-07-26 DIAGNOSIS — E669 Obesity, unspecified: Secondary | ICD-10-CM | POA: Diagnosis not present

## 2023-07-26 LAB — LIPID PANEL WITH LDL/HDL RATIO
Cholesterol, Total: 183 mg/dL (ref 100–199)
HDL: 54 mg/dL (ref >39)
LDL Chol Calc (NIH): 108 mg/dL — ABNORMAL HIGH (ref 0–99)
LDL/HDL Ratio: 2 {ratio} (ref 0.0–3.2)
Triglycerides: 119 mg/dL (ref 0–149)
VLDL Cholesterol Cal: 21 mg/dL (ref 5–40)

## 2023-07-26 LAB — VITAMIN B12: Vitamin B-12: >2000 pg/mL — ABNORMAL HIGH (ref 232–1245)

## 2023-07-26 LAB — LEPTIN, SERUM: Leptin, Serum: 40.2 ng/mL

## 2023-07-26 LAB — VITAMIN D 25 HYDROXY (VIT D DEFICIENCY, FRACTURES): Vit D, 25-Hydroxy: 57.2 ng/mL (ref 30.0–100.0)

## 2023-07-26 LAB — INSULIN, RANDOM: INSULIN: 19.3 u[IU]/mL (ref 2.6–24.9)

## 2023-07-26 NOTE — Progress Notes (Signed)
 Office: [phone]  /  Fax: [phone]  Weight Summary And Biometrics  Vitals Temp: 98.4 F (36.9 C) BP: 130/75 Pulse Rate: 90 SpO2: 99 %   Anthropometric Measurements Height: 5\' 1"  (1.549 m) Weight: 214 lb (97.1 kg) BMI (Calculated): 40.46 Weight at Last Visit: 218 lb Weight Lost Since Last Visit: 4 lb Weight Gained Since Last Visit: 0 lb Starting Weight: 218 lb Peak Weight: 280 lb   Body Composition  Body Fat %: 45.1 % Fat Mass (lbs): 96.6 lbs Muscle Mass (lbs): 111.6 lbs Total Body Water (lbs): 75.4 lbs Visceral Fat Rating : 14  The 10-year ASCVD risk score (Arnett DK, et al., 2019) is: 5%   RMR: 1886  Today's Visit #: 2  Starting Date: 07/12/23   Subjective   Chief Complaint: Obesity  Tabitha [name] is here to discuss her progress with her obesity treatment plan. She is on the the Category 2 Plan and states she is following her eating plan approximately 95 % of the time. She states she is exercising 30-60 minutes 3-4 times per week.  Weight Progress Since Last Visit:  Discussed the use of AI scribe software for clinical note transcription with the patient, who gave verbal consent to proceed.  History of Present Illness   Tabitha [name] "Tabitha [name]" is a 58 year old female with diabetes who presents for medical weight management.   She has successfully lost four pounds since her last visit, aligning with her goal of losing one to two pounds per week. She began her weight management plan on the Sunday following her last appointment due to a busy week. Her husband humorously described their diet as 'eating air,' although he has a hidden stash of chocolate.  She exercises regularly, working with a trainer twice a week for an hour focusing on strength building and heavy lifting. She is interested in competitive deadlifting in her age group. She exercises 30 to 60 minutes, three to four times per week. Her body composition has improved, with a decrease in body fat  percentage from 47.7% to 45% and a reduction in visceral fat rating from 16 to 14. Her muscle mass has increased from 109 to 111 pounds while her fat mass has decreased from 101 to 96 pounds.  Her dietary routine includes three meals and two snacks per day, with adjustments based on her schedule. She finds eating meals during the day and snacks at night works better for her. She uses Chat[employer] to build meal plans,She consumes more meat-based proteins but has not experienced constipation, a previous issue. She is mindful of her fruit and vegetable intake and is considering adjusting her fruit choices based on fructose content.  Her blood sugar levels have improved, with fasting levels around 120. Recent blood work showed a blood sugar level of 165 but this was before dietary changes, liver enzymes within normal limits, kidney function with a GFR of 103, and total cholesterol of 183. Her A1c is 6.7, indicating well-controlled diabetes. Her insulin levels are elevated at 19.3, suggesting insulin resistance. She has a history of high insulin levels, previously above 40, noted about 15 to 20 years ago.  She takes a B12 supplement, not daily, but more often than needed, resulting in high B12 levels. Her vitamin D levels are good. She is awaiting results for leptin levels to assess for leptin resistance or deficiency.      Challenges affecting patient progress: medical comorbidities.   Orexigenic Control: Reports improved problems with appetite and hunger  signals.  Reports improved problems with satiety and satiation.  Denies problems with eating patterns and portion control.  Denies abnormal cravings. Denies feeling deprived or restricted.   Pharmacotherapy for weight management: She is currently taking no anti-obesity medication.   Assessment and Plan   Treatment Plan For Obesity:  Recommended Dietary Goals  Tabitha [name] is currently in the action stage of change. As such, her goal is to continue weight  management plan. She has agreed to: continue current plan  Behavioral Health and Counseling  We discussed the following behavioral modification strategies today: continue to work on maintaining a reduced calorie state, getting the recommended amount of protein, incorporating whole foods, making healthy choices, staying well hydrated and practicing mindfulness when eating..  Additional education and resources provided today: None  Recommended Physical Activity Goals  Tabitha [name] has been advised to work up to 150 minutes of moderate intensity aerobic activity a week and strengthening exercises 2-3 times per week for cardiovascular health, weight loss maintenance and preservation of muscle mass.   She has agreed to :  continue to gradually increase the amount and intensity of exercise routine  Pharmacotherapy  We discussed various medication options to help Tabitha [name] with her weight loss efforts and we both agreed to : continue with nutritional and behavioral strategies  Associated Conditions Impacted by Obesity Treatment  There are no diagnoses linked to this encounter.  Assessment and Plan    Obesity Presents for medical weight management. Successfully lost 4 pounds since the last visit, with body fat percentage decreasing from 47.7% to 45% and visceral fat rating improving from 16 to 14. Muscle mass maintained. Discussed balanced diet, including protein intake and low glycemic index foods. Considering GLP-1 therapy (Mounjaro) but currently achieving weight loss through lifestyle changes. Discussed GLP-1 therapy's mechanism, potential side effects (nausea, constipation), and long-term use. Emphasized the importance of nutritional guidance to mitigate side effects. Mentioned that discontinuation could lead to weight regain unless lifestyle changes are rigorously maintained. - Continue current diet and exercise regimen - Consider GLP-1 therapy (Mounjaro) if weight loss plateaus or experiences  increased hunger  Type 2 Diabetes Mellitus Blood sugar levels improved, with fasting levels around 120 mg/dL. Hemoglobin A1c is 6.7%, indicating well-controlled diabetes. Insulin levels elevated at 19.3, suggesting insulin resistance.  Currently on Synjardy.  Discussed potential benefits of GLP-1 therapy in reducing A1c and aiding weight loss. - Continue current diabetes management - Consider GLP-1 therapy (Mounjaro) for further improvement in glycemic control  Hyperlipidemia Total cholesterol is 183 mg/dL. LDL cholesterol should be closer to 70 mg/dL due to diabetes. Discussed benefits of statins in reducing cardiovascular risk and preventing plaque buildup. Explained that statins reduce the risk of stroke and heart attack by about 30%. - Initiate statin therapy to lower LDL cholesterol  General Health Maintenance Up to date with colonoscopy (last done a year ago). Mammogram due this spring. Pap smear needed. Discussed lung cancer screening but does not meet criteria based on smoking history. - Schedule mammogram - Schedule Pap smear  Follow-up - Follow-up appointment in three weeks.       Objective   Physical Exam:  Blood pressure 130/75, pulse 90, temperature 98.4 F (36.9 C), height 5\' 1"  (1.549 m), weight 214 lb (97.1 kg), SpO2 99%. Body mass index is 40.43 kg/m.  General: She is overweight, cooperative, alert, well developed, and in no acute distress. PSYCH: Has normal mood, affect and thought process.   HEENT: EOMI, sclerae are anicteric. Lungs: Normal breathing  effort, no conversational dyspnea. Extremities: No edema.  Neurologic: No gross sensory or motor deficits. No tremors or fasciculations noted.    Diagnostic Data Reviewed:  BMET    Component Value Date/Time   NA 139 07/12/2023 0000   K 4.5 07/12/2023 0000   CL 100 07/12/2023 0000   CO2 22 07/12/2023 0000   GLUCOSE 140 (H) 04/04/2022 0000   BUN 15 07/12/2023 0000   CREATININE 0.6 07/12/2023 0000    CREATININE 0.72 04/04/2022 0000   CALCIUM 9.6 07/12/2023 0000   GFRNONAA 98 08/21/2019 0834   GFRAA 114 08/21/2019 0834   Lab Results  Component Value Date   HGBA1C 6.7 07/12/2023   HGBA1C 7.2 09/06/2011   Lab Results  Component Value Date   INSULIN 19.3 07/25/2023   Lab Results  Component Value Date   TSH 1.23 09/15/2022   CBC    Component Value Date/Time   WBC 6.6 07/12/2023 0000   WBC 7.9 04/04/2022 0000   RBC 5.09 07/12/2023 0000   HGB 15.3 07/12/2023 0000   HCT 46 07/12/2023 0000   PLT 183 07/12/2023 0000   MCV 91.1 04/04/2022 0000   MCH 30.6 04/04/2022 0000   MCHC 33.5 04/04/2022 0000   RDW 11.8 04/04/2022 0000   Iron Studies    Component Value Date/Time   FERRITIN 71 09/15/2022 0000   Lipid Panel     Component Value Date/Time   CHOL 183 07/25/2023 0754   TRIG 119 07/25/2023 0754   HDL 54 07/25/2023 0754   CHOLHDL 2.4 04/04/2022 0000   VLDL 25.0 09/25/2019 0920   LDLCALC 108 (H) 07/25/2023 0754   LDLCALC 87 04/04/2022 0000   LDLDIRECT 149 (H) 12/06/2011 1702   Hepatic Function Panel     Component Value Date/Time   PROT 7.0 04/04/2022 0000   ALBUMIN 4.5 07/12/2023 0000   AST 23 07/12/2023 0000   ALT 33 07/12/2023 0000   ALKPHOS 99 07/12/2023 0000   BILITOT 0.5 04/04/2022 0000      Component Value Date/Time   TSH 1.23 09/15/2022 0000   TSH 1.02 12/11/2018 1102   Nutritional Lab Results  Component Value Date   VD25OH 57.2 07/25/2023   VD25OH 38.8 09/15/2022    Follow-Up   No follow-ups on file.Marland Kitchen She was informed of the importance of frequent follow up visits to maximize her success with intensive lifestyle modifications for her multiple health conditions.  Attestation Statement   Reviewed by clinician on day of visit: allergies, medications, problem list, medical history, surgical history, family history, social history, and previous encounter notes.   I have spent 40 minutes in the care of the patient today including: preparing to  see patient (e.g. review and interpretation of tests, old notes ), obtaining and/or reviewing separately obtained history, performing a medically appropriate examination or evaluation, counseling and educating the patient, documenting clinical information in the electronic or other health care record, and independently interpreting results and communicating results to the patient, family, or caregiver   Worthy Rancher, MD

## 2023-07-30 ENCOUNTER — Other Ambulatory Visit: Payer: Self-pay

## 2023-07-30 ENCOUNTER — Other Ambulatory Visit (HOSPITAL_COMMUNITY): Payer: Self-pay

## 2023-07-30 NOTE — Progress Notes (Signed)
 Specialty Pharmacy Refill Coordination Note  Tabitha [name] is a 58 y.o. female contacted today regarding refills of specialty medication(s) Bimekizumab-bkzx (Bimzelx)   Patient requested [name] at [facility] at [place] date: 08/02/23   Medication will be filled on 08/01/23.   Patient is on week 12 of Loading Dose.   Injection is due Thursday, 08/02/23.

## 2023-08-01 ENCOUNTER — Other Ambulatory Visit: Payer: Self-pay

## 2023-08-01 DIAGNOSIS — F4322 Adjustment disorder with anxiety: Secondary | ICD-10-CM | POA: Diagnosis not present

## 2023-08-02 ENCOUNTER — Other Ambulatory Visit (HOSPITAL_COMMUNITY): Payer: Self-pay

## 2023-08-03 LAB — GENECONNECT MOLECULAR SCREEN: Genetic Analysis Overall Interpretation: NEGATIVE

## 2023-08-06 ENCOUNTER — Other Ambulatory Visit (HOSPITAL_COMMUNITY): Payer: Self-pay

## 2023-08-06 ENCOUNTER — Other Ambulatory Visit: Payer: Self-pay

## 2023-08-06 ENCOUNTER — Ambulatory Visit: Payer: Commercial Managed Care - PPO | Admitting: Family Medicine

## 2023-08-06 MED ORDER — LISDEXAMFETAMINE DIMESYLATE 60 MG PO CAPS
60.0000 mg | ORAL_CAPSULE | Freq: Every day | ORAL | 0 refills | Status: DC
  Filled 2023-08-07: qty 30, 30d supply, fill #0

## 2023-08-07 ENCOUNTER — Other Ambulatory Visit (HOSPITAL_COMMUNITY): Payer: Self-pay

## 2023-08-08 NOTE — Progress Notes (Signed)
 [name] [address] [place] [address] Phone: [phone] Subjective:   [name], am serving as a scribe for Dr. Antoine Primas.  I'm seeing this patient by the request  of:  Everrett Coombe, DO  CC: back and neck pain follow up   [id number]  Tabitha [name] is a 58 y.o. female coming in with complaint of back and neck pain. OMT on 07/09/2023. Patient states that she is having lower back and R SI joint pain since last week. Yesterday her pain was worse. Took celebrex and was able to hike. Pain returned again this morning. Patient notes shampooing carpets 2 weeks ago and wonders if she did something then. Denies any radicular symptoms.   Medications patient has been prescribed:   Taking:      Being seen for psoriatic arthritis by rheumatology.   Reviewed prior external information including notes and imaging from previsou exam, outside providers and external EMR if available.   As well as notes that were available from care everywhere and other healthcare systems.  Past medical history, social, surgical and family history all reviewed in electronic medical record.  No pertanent information unless stated regarding to the chief complaint.   Past Medical History:  Diagnosis Date   A-fib (HCC)    Alcohol abuse    Anemia    Arthritis    Asthma    Back pain    De Quervain's disease (tenosynovitis)    Dermatophytosis, scalp    Diabetes mellitus    Drug use    Edema of both lower extremities    Fatty liver    Female infertility    Gallbladder problem    Hypercholesteremia    Joint pain    Obesity    Osteoarthritis    Palpitations    PCOS (polycystic ovarian syndrome)    Polycystic ovarian disease    Psoriatic arthritis (HCC)    SOB (shortness of breath)    Vitamin D deficiency    Wears glasses     Allergies  Allergen Reactions   Chocolate Anaphylaxis   Corn-Containing Products Nausea Only    Fatigue, body aches,  upset stomach,   Food     chocolate   Gluten Meal    Latex Rash   Methotrexate Rash     Review of Systems:  No headache, visual changes, nausea, vomiting, diarrhea, constipation, dizziness, abdominal pain, skin rash, fevers, chills, night sweats, weight loss, swollen lymph nodes, body aches, joint swelling, chest pain, shortness of breath, mood changes. POSITIVE muscle aches  Objective  Blood pressure 124/72, pulse 67, height 5\' 1"  (1.549 m), SpO2 99%.   General: No apparent distress alert and oriented x3 mood and affect normal, dressed appropriately.  HEENT: Pupils equal, extraocular movements intact  Respiratory: Patient's speak in full sentences and does not appear short of breath  Cardiovascular: No lower extremity edema, non tender, no erythema  Gait relatively normal but a little cautious MSK:  Back does have some loss lordosis.  Tenderness to palpation noted.  More than have the more increased discomfort in the lower back.  Difficulty going from a seated to standing position.  Osteopathic findings  C2 flexed rotated and side bent right C6 flexed rotated and side bent left T3 extended rotated and side bent right inhaled rib T9 extended rotated and side bent left L2 flexed rotated and side bent right Sacrum right on right    Assessment and Plan:  Low back pain Low  back does have some loss lordosis noted.  Some tenderness to palpation noted.  Discussed icing regimen and home exercises, increase activity slowly.  Follow-up again in 6 to 8 weeks Toradol and Depo-Medrol given again today.  Hopeful that this will make some improvement.  Has had difficulty with pain more than usual.    Nonallopathic problems  Decision today to treat with OMT was based on Physical Exam  After verbal consent patient was treated with HVLA, ME, FPR techniques in cervical, rib, thoracic, lumbar, and sacral  areas  Patient tolerated the procedure well with improvement in symptoms  Patient  given exercises, stretches and lifestyle modifications  See medications in patient instructions if given  Patient will follow up in 4-8 weeks    The above documentation has been reviewed and is accurate and complete [name], DO          Note: This dictation was prepared with Dragon dictation along with smaller phrase technology. Any transcriptional errors that result from this process are unintentional.

## 2023-08-10 DIAGNOSIS — F411 Generalized anxiety disorder: Secondary | ICD-10-CM | POA: Diagnosis not present

## 2023-08-14 ENCOUNTER — Ambulatory Visit: Payer: Commercial Managed Care - PPO | Admitting: Family Medicine

## 2023-08-14 ENCOUNTER — Encounter: Payer: Self-pay | Admitting: Family Medicine

## 2023-08-14 VITALS — BP 124/72 | HR 67 | Ht 61.0 in

## 2023-08-14 DIAGNOSIS — M545 Low back pain, unspecified: Secondary | ICD-10-CM

## 2023-08-14 DIAGNOSIS — G8929 Other chronic pain: Secondary | ICD-10-CM

## 2023-08-14 DIAGNOSIS — M9904 Segmental and somatic dysfunction of sacral region: Secondary | ICD-10-CM

## 2023-08-14 DIAGNOSIS — M9901 Segmental and somatic dysfunction of cervical region: Secondary | ICD-10-CM

## 2023-08-14 DIAGNOSIS — M2550 Pain in unspecified joint: Secondary | ICD-10-CM | POA: Diagnosis not present

## 2023-08-14 DIAGNOSIS — M9902 Segmental and somatic dysfunction of thoracic region: Secondary | ICD-10-CM | POA: Diagnosis not present

## 2023-08-14 DIAGNOSIS — M9903 Segmental and somatic dysfunction of lumbar region: Secondary | ICD-10-CM | POA: Diagnosis not present

## 2023-08-14 DIAGNOSIS — M9908 Segmental and somatic dysfunction of rib cage: Secondary | ICD-10-CM | POA: Diagnosis not present

## 2023-08-14 MED ORDER — METHYLPREDNISOLONE ACETATE 80 MG/ML IJ SUSP
80.0000 mg | Freq: Once | INTRAMUSCULAR | Status: AC
  Administered 2023-08-14: 80 mg via INTRAMUSCULAR

## 2023-08-14 MED ORDER — KETOROLAC TROMETHAMINE 60 MG/2ML IM SOLN
60.0000 mg | Freq: Once | INTRAMUSCULAR | Status: AC
  Administered 2023-08-14: 60 mg via INTRAMUSCULAR

## 2023-08-14 NOTE — Patient Instructions (Signed)
 Injections in backside Tabitha [name] has to clean carpets See me in 4-6 weeks

## 2023-08-14 NOTE — Assessment & Plan Note (Addendum)
 Low back does have some loss lordosis noted.  Some tenderness to palpation noted.  Discussed icing regimen and home exercises, increase activity slowly.  Follow-up again in 6 to 8 weeks Toradol and Depo-Medrol given again today.  Hopeful that this will make some improvement.  Has had difficulty with pain more than usual.

## 2023-08-16 ENCOUNTER — Other Ambulatory Visit: Payer: Self-pay

## 2023-08-16 ENCOUNTER — Ambulatory Visit (INDEPENDENT_AMBULATORY_CARE_PROVIDER_SITE_OTHER): Payer: BC Managed Care – PPO | Admitting: Internal Medicine

## 2023-08-16 ENCOUNTER — Encounter (INDEPENDENT_AMBULATORY_CARE_PROVIDER_SITE_OTHER): Payer: Self-pay | Admitting: Internal Medicine

## 2023-08-16 VITALS — BP 129/75 | HR 56 | Temp 97.5°F | Ht 61.0 in | Wt 213.0 lb

## 2023-08-16 DIAGNOSIS — E1169 Type 2 diabetes mellitus with other specified complication: Secondary | ICD-10-CM | POA: Diagnosis not present

## 2023-08-16 DIAGNOSIS — Z6841 Body Mass Index (BMI) 40.0 and over, adult: Secondary | ICD-10-CM | POA: Diagnosis not present

## 2023-08-16 DIAGNOSIS — Z7984 Long term (current) use of oral hypoglycemic drugs: Secondary | ICD-10-CM | POA: Diagnosis not present

## 2023-08-16 DIAGNOSIS — G4733 Obstructive sleep apnea (adult) (pediatric): Secondary | ICD-10-CM

## 2023-08-16 DIAGNOSIS — E66813 Obesity, class 3: Secondary | ICD-10-CM

## 2023-08-16 DIAGNOSIS — E785 Hyperlipidemia, unspecified: Secondary | ICD-10-CM

## 2023-08-16 NOTE — Assessment & Plan Note (Signed)
 Patient had recent lapse due to multiple competing priorities but is getting back on track she actually did a good job maintaining and even lost 1 pound. - Refer to obesity management plan - Continue with current weight management strategy. - Discussed adjustments to overcome identified barriers. - Reinforce dietary and exercise goals.

## 2023-08-16 NOTE — Assessment & Plan Note (Signed)
 Patient counseled today on cardiovascular risk as well as strategies to reduce risk.  She is willing to consider statin therapy.  Had been on statin in the past

## 2023-08-16 NOTE — Assessment & Plan Note (Signed)
 On CPAP with reported good compliance. Continue PAP therapy. Losing 15% or more of body weight may improve AHI.

## 2023-08-16 NOTE — Assessment & Plan Note (Signed)
  On Synjardy XR with good blood sugar control, occasional afternoon lows. Nighttime blood sugars improved from 140-150 to 110-120. Discussed Synjardy XR benefits, including weight loss and blood sugar control. - Continue Synjardy XR

## 2023-08-16 NOTE — Progress Notes (Signed)
 Office: [phone]  /  Fax: [phone]  Weight Summary And Biometrics  Vitals Temp: (!) 97.5 F (36.4 C) BP: 129/75 Pulse Rate: (!) 56 SpO2: 100 %   Anthropometric Measurements Height: 5\' 1"  (1.549 m) Weight: 213 lb (96.6 kg) BMI (Calculated): 40.27 Weight at Last Visit: 214 lb Weight Lost Since Last Visit: 1 lb Weight Gained Since Last Visit: 0 Starting Weight: 218 lb Total Weight Loss (lbs): 5 lb (2.268 kg) Peak Weight: 280 lb   Body Composition  Body Fat %: 44.6 % Fat Mass (lbs): 95 lbs Muscle Mass (lbs): 112 lbs Total Body Water (lbs): 74.4 lbs Visceral Fat Rating : 14    RMR: 1886  Today's Visit #: 3  Starting Date: 07/12/23   Subjective   Chief Complaint: Obesity  Interval History Discussed the use of AI scribe software for clinical note transcription with the patient, who gave verbal consent to proceed.  History of Present Illness   Tabitha [name] "Tabitha [name]" is a 58 year old female with obesity, obstructive sleep apnea, type 2 diabetes, and psoriatic arthritis who presents for medical weight management.  She has lost one pound since her last visit and follows a category two plan 70-80% of the time, focusing on consuming more whole foods, maintaining hydration, and avoiding skipping meals, though she occasionally eats processed foods. Her exercise routine includes strength training twice a week for 60 minutes and cardio two to three times weekly for 30-45 minutes. She sleeps about 6-7 hours per night and experiences higher stress levels.  She is on Synjardy XR for diabetes, which has improved her blood sugar control. Her blood sugars occasionally drop to around 70 in the afternoon, causing symptoms, but nighttime levels have decreased from 140-150 to 110-120.  She has psoriatic arthritis and recently received a Toradol injection for a flare. Arthritis flares can make focusing more difficult, which sometimes necessitates the use of Adderall in the  afternoons.  She has a history of attention deficit disorder and is on Vyvanse and Adderall for ADHD, which helps her focus, especially after starting to work from home. She occasionally adds Adderall in the afternoons if needed, but hasn't recently.       Challenges affecting patient progress: multiple competing priorities, moderate to high levels of stress, and menopause.    Pharmacotherapy for weight management: She is currently taking  Synjardy for diabetes .   Assessment and Plan   Treatment Plan For Obesity:  Recommended Dietary Goals  Tabitha [name] [name] is currently in the action stage of change. As such, her goal is to continue weight management plan. She has agreed to: continue current plan  Behavioral Health and Counseling  We discussed the following behavioral modification strategies today: continue to work on maintaining a reduced calorie state, getting the recommended amount of protein, incorporating whole foods, making healthy choices, staying well hydrated and practicing mindfulness when eating..  Additional education and resources provided today: None  Recommended Physical Activity Goals  Tabitha [name] [name] has been advised to work up to 150 minutes of moderate intensity aerobic activity a week and strengthening exercises 2-3 times per week for cardiovascular health, weight loss maintenance and preservation of muscle mass.   She has agreed to :  continue to gradually increase the amount and intensity of exercise routine  Pharmacotherapy  We discussed various medication options to help Tabitha [name] with her weight loss efforts and we both agreed to : adequate clinical response to current dose, continue current regimen  Associated Conditions  Impacted by Obesity Treatment  OSA (obstructive sleep apnea) Assessment & Plan: On CPAP with reported good compliance. Continue PAP therapy. Losing 15% or more of body weight may improve AHI.      Type 2 diabetes mellitus with other specified  complication, without long-term current use of insulin (HCC) Assessment & Plan:  On Synjardy XR with good blood sugar control, occasional afternoon lows. Nighttime blood sugars improved from 140-150 to 110-120. Discussed Synjardy XR benefits, including weight loss and blood sugar control. - Continue Synjardy XR      Class 3 severe obesity with serious comorbidity and body mass index (BMI) of 40.0 to 44.9 in adult, unspecified obesity type [facility]) Assessment & Plan: Patient had recent lapse due to multiple competing priorities but is getting back on track she actually did a good job maintaining and even lost 1 pound. - Refer to obesity management plan - Continue with current weight management strategy. - Discussed adjustments to overcome identified barriers. - Reinforce dietary and exercise goals.    Hyperlipidemia associated with type 2 diabetes mellitus (HCC) Assessment & Plan: Patient counseled today on cardiovascular risk as well as strategies to reduce risk.  She is willing to consider statin therapy.  Had been on statin in the past         - Schedule follow-up appointment in 3-4 weeks.          Objective   Physical Exam:  Blood pressure 129/75, pulse (!) 56, temperature (!) 97.5 F (36.4 C), height 5\' 1"  (1.549 m), weight 213 lb (96.6 kg), SpO2 100%. Body mass index is 40.25 kg/m.  General: She is overweight, cooperative, alert, well developed, and in no acute distress. PSYCH: Has normal mood, affect and thought process.   HEENT: EOMI, sclerae are anicteric. Lungs: Normal breathing effort, no conversational dyspnea. Extremities: No edema.  Neurologic: No gross sensory or motor deficits. No tremors or fasciculations noted.    Diagnostic Data Reviewed:  BMET    Component Value Date/Time   NA 139 07/12/2023 0000   K 4.5 07/12/2023 0000   CL 100 07/12/2023 0000   CO2 22 07/12/2023 0000   GLUCOSE 140 (H) 04/04/2022 0000   BUN 15 07/12/2023 0000   CREATININE  0.6 07/12/2023 0000   CREATININE 0.72 04/04/2022 0000   CALCIUM 9.6 07/12/2023 0000   GFRNONAA 98 08/21/2019 0834   GFRAA 114 08/21/2019 0834   Lab Results  Component Value Date   HGBA1C 6.7 07/12/2023   HGBA1C 7.2 09/06/2011   Lab Results  Component Value Date   INSULIN 19.3 07/25/2023   Lab Results  Component Value Date   TSH 1.23 09/15/2022   CBC    Component Value Date/Time   WBC 6.6 07/12/2023 0000   WBC 7.9 04/04/2022 0000   RBC 5.09 07/12/2023 0000   HGB 15.3 07/12/2023 0000   HCT 46 07/12/2023 0000   PLT 183 07/12/2023 0000   MCV 91.1 04/04/2022 0000   MCH 30.6 04/04/2022 0000   MCHC 33.5 04/04/2022 0000   RDW 11.8 04/04/2022 0000   Iron Studies    Component Value Date/Time   FERRITIN 71 09/15/2022 0000   Lipid Panel     Component Value Date/Time   CHOL 183 07/25/2023 0754   TRIG 119 07/25/2023 0754   HDL 54 07/25/2023 0754   CHOLHDL 2.4 04/04/2022 0000   VLDL 25.0 09/25/2019 0920   LDLCALC 108 (H) 07/25/2023 0754   LDLCALC 87 04/04/2022 0000   LDLDIRECT 149 (H) 12/06/2011 1702  Hepatic Function Panel     Component Value Date/Time   PROT 7.0 04/04/2022 0000   ALBUMIN 4.5 07/12/2023 0000   AST 23 07/12/2023 0000   ALT 33 07/12/2023 0000   ALKPHOS 99 07/12/2023 0000   BILITOT 0.5 04/04/2022 0000      Component Value Date/Time   TSH 1.23 09/15/2022 0000   TSH 1.02 12/11/2018 1102   Nutritional Lab Results  Component Value Date   VD25OH 57.2 07/25/2023   VD25OH 38.8 09/15/2022    Medications: Outpatient Encounter Medications as of 08/16/2023  Medication Sig Note   ACCU-CHEK GUIDE test strip  11/26/2020: use   Acetaminophen (TYLENOL PO) Take by mouth as needed. Not sure the dose    amphetamine-dextroamphetamine (ADDERALL) 10 MG tablet Take 1 tablet by mouth daily as directed (Patient taking differently: Take 10 mg by mouth as needed. Only takes as needed)    bimekizumab-bkzx (BIMZELX) 160 MG/ML pen Inject 320 mg subcutaneously every 8  (eight) weeks MAINTENANCE DOSE: (Patient taking differently: Inject into the skin every 30 (thirty) days.)    bimekizumab-bkzx (BIMZELX) 160 MG/ML pen Inject 320 mg subcutaneously every 28 (twenty-eight) days LOADING DOSE: Inject 320mg  (two 160mg  injections) SubQ at week 0, 4, 8, 12, and 16    celecoxib (CELEBREX) 100 MG capsule Take 1 capsule (100 mg total) by mouth 2 (two) times daily.    clobetasol (TEMOVATE) 0.05 % external solution Apply topically 2 (two) times daily (Patient taking differently: Apply 1 Application topically as needed.)    clotrimazole-betamethasone (LOTRISONE) cream Apply to affected area(s) on skin 2 (two) times daily. (Patient taking differently: Apply 1 application  topically as needed.)    Continuous Glucose Sensor (FREESTYLE LIBRE 3 PLUS SENSOR) MISC Change sensor every 15 days.    desonide (DESOWEN) 0.05 % cream Apply 1 application topically 2 (two) times daily. (Patient taking differently: Apply 1 Application topically as needed.)    Empagliflozin-metFORMIN HCl ER (SYNJARDY XR) 03-999 MG TB24 Take 1 tablet by mouth daily with breakfast.    glucose blood (FREESTYLE LITE) test strip Use to check blood sugar 3 times a day    lisdexamfetamine (VYVANSE) 60 MG capsule Take 1 capsule by mouth daily    Naltrexone HCl, Pain, 4.5 MG CAPS Take 1 tablet by mouth every morning.    Omega-3 Fatty Acids (FISH OIL) 1200 MG CAPS Take 1,200-2,400 mg by mouth 2 (two) times daily. Takes 2400 mg qam and 1200 mg qpm    traZODone (DESYREL) 50 MG tablet Take 1-2 tablets (50-100 mg total) by mouth at bedtime.    TURMERIC PO Take 1 tablet by mouth as needed.    [DISCONTINUED] escitalopram (LEXAPRO) 10 MG tablet Take 1 tablet (10 mg total) by mouth daily. (Patient not taking: Reported on 07/26/2023) 08/16/2023: never filled   No facility-administered encounter medications on file as of 08/16/2023.     Follow-Up   Return in about 3 weeks (around 09/06/2023) for For Weight Mangement with Dr.  Rikki Spearing.Marland Kitchen She was informed of the importance of frequent follow up visits to maximize her success with intensive lifestyle modifications for her multiple health conditions.  Attestation Statement   Reviewed by clinician on day of visit: allergies, medications, problem list, medical history, surgical history, family history, social history, and previous encounter notes.     Worthy Rancher, MD

## 2023-08-20 DIAGNOSIS — F4322 Adjustment disorder with anxiety: Secondary | ICD-10-CM | POA: Diagnosis not present

## 2023-08-21 ENCOUNTER — Other Ambulatory Visit: Payer: Self-pay

## 2023-08-23 ENCOUNTER — Other Ambulatory Visit: Payer: Self-pay

## 2023-08-23 NOTE — Progress Notes (Signed)
 Specialty Pharmacy Refill Coordination Note  Tabitha [name] is a 58 y.o. female contacted today regarding refills of specialty medication(s) Bimekizumab-bkzx (Bimzelx)   Patient requested [name] at [facility] at [place] date: 08/28/23   Medication will be filled on 08/27/23.

## 2023-08-27 ENCOUNTER — Other Ambulatory Visit: Payer: Self-pay

## 2023-08-27 DIAGNOSIS — F4322 Adjustment disorder with anxiety: Secondary | ICD-10-CM | POA: Diagnosis not present

## 2023-09-06 ENCOUNTER — Ambulatory Visit (INDEPENDENT_AMBULATORY_CARE_PROVIDER_SITE_OTHER): Payer: BC Managed Care – PPO | Admitting: Internal Medicine

## 2023-09-06 ENCOUNTER — Encounter (INDEPENDENT_AMBULATORY_CARE_PROVIDER_SITE_OTHER): Payer: Self-pay | Admitting: Internal Medicine

## 2023-09-06 VITALS — BP 113/74 | HR 62 | Temp 97.5°F | Ht 61.0 in | Wt 210.0 lb

## 2023-09-06 DIAGNOSIS — E66813 Obesity, class 3: Secondary | ICD-10-CM | POA: Diagnosis not present

## 2023-09-06 DIAGNOSIS — E1169 Type 2 diabetes mellitus with other specified complication: Secondary | ICD-10-CM | POA: Diagnosis not present

## 2023-09-06 DIAGNOSIS — Z7985 Long-term (current) use of injectable non-insulin antidiabetic drugs: Secondary | ICD-10-CM

## 2023-09-06 DIAGNOSIS — G4733 Obstructive sleep apnea (adult) (pediatric): Secondary | ICD-10-CM

## 2023-09-06 DIAGNOSIS — Z6841 Body Mass Index (BMI) 40.0 and over, adult: Secondary | ICD-10-CM

## 2023-09-06 NOTE — Progress Notes (Signed)
 Office: [phone]  /  Fax: [phone]  Weight Summary And Biometrics  Vitals Temp: (!) 97.5 F (36.4 C) BP: 113/74 Pulse Rate: 62 SpO2: 98 %   Anthropometric Measurements Height: 5\' 1"  (1.549 m) Weight: 210 lb (95.3 kg) BMI (Calculated): 39.7 Weight at Last Visit: 213 lb Weight Lost Since Last Visit: 3 lb Weight Gained Since Last Visit: 0 lb Starting Weight: 218 lb Total Weight Loss (lbs): 6 lb (2.722 kg) Peak Weight: 280 lb   Body Composition  Body Fat %: 44 % Fat Mass (lbs): 92.6 lbs Muscle Mass (lbs): 112 lbs Total Body Water (lbs): 74.6 lbs Visceral Fat Rating : 14    RMR: 1886  Today's Visit #: 4  Starting Date: 07/12/23   Subjective   Chief Complaint: Obesity  Interval History Discussed the use of AI scribe software for clinical note transcription with the patient, who gave verbal consent to proceed.  History of Present Illness Tabitha [name] "Tabitha [name]" is a 58 year old female with obstructive sleep apnea and type 2 diabetes who presents for medical weight management.  She has a history of obstructive sleep apnea and type 2 diabetes. She is currently on Synjardy for diabetes, which aids in weight loss and maintenance, and uses a CPAP machine for her OSA. Since her last visit, she has lost three pounds. She follows her meal plan 70-80% of the time, incorporating more whole foods, getting the recommended amount of protein, and maintaining adequate hydration. She exercises four days a week for 30 to 60 minutes, including strength training, cardio, and yoga. Despite these efforts, she is not getting adequate sleep and is experiencing higher than normal stress levels. Her bioimpedance analysis shows preservation of muscle mass with a reduction in fat mass. Her husband helps with meal preparation, although she sometimes deviates from the meal plan due to travel and other activities. She emphasizes the importance of balanced meals, adequate protein, and increased  fruits and vegetables. She experiences fluctuations in appetite, particularly on days with high energy expenditure, such as leg workouts, and manages hunger by spacing out snacks, like having an apple and cottage cheese separately.  She is on Synjardy for diabetes management, which also aids in weight loss. Her blood glucose levels have improved, with nighttime readings dropping from 140-150 mg/dL to 324-401 mg/dL, and sometimes below 027 mg/dL. She anticipates a lower HbA1c in the next test. These improvements are attributed to dietary changes, particularly the reduction of high glycemic carbohydrates.  She uses CPAP therapy for OSA management.   She is on Vyvanse and Adderall for ADD management.  Which has not resulted in a restrictive eating pattern  She is actively engaged in lifestyle modifications, including exercise and dietary changes, to support overall health. The role of gut microbiome and the impact of diet on health, emphasizing whole foods and reducing processed foods, were discussed.    Challenges affecting patient progress: none.    Pharmacotherapy for weight management: She is currently taking no anti-obesity medication and Synjardy .   Assessment and Plan   Treatment Plan For Obesity:  Recommended Dietary Goals  Tabitha [name] [name] is currently in the action stage of change. As such, her goal is to continue weight management plan. She has agreed to: follow the Category 2 plan - 1200 kcal per day  Behavioral Health and Counseling  We discussed the following behavioral modification strategies today: increasing lean protein intake to established goals, avoiding skipping meals, and continue to work on maintaining a reduced  calorie state, getting the recommended amount of protein, incorporating whole foods, making healthy choices, staying well hydrated and practicing mindfulness when eating..  Additional education and resources provided today: Handout and personalized instruction on  tracking and journaling using Apps  Recommended Physical Activity Goals  Tabitha [name] [name] has been advised to work up to 150 minutes of moderate intensity aerobic activity a week and strengthening exercises 2-3 times per week for cardiovascular health, weight loss maintenance and preservation of muscle mass.   She has agreed to :  continue to gradually increase the amount and intensity of exercise routine  Pharmacotherapy  We discussed various medication options to help Tabitha [name] with her weight loss efforts and we both agreed to : continue with nutritional and behavioral strategies  Associated Conditions Impacted by Obesity Treatment  OSA (obstructive sleep apnea) Assessment & Plan: On CPAP with reported good compliance. Continue PAP therapy. Losing 15% or more of body weight may improve AHI.      Type 2 diabetes mellitus with other specified complication, without long-term current use of insulin [facility]) Assessment & Plan: She is on Synjardy for diabetes management. Her blood sugars have improved, with nighttime levels dropping from 140-150s to 110s, and sometimes below 100. Her fasting blood sugar is now around 120, which is an improvement attributed to reduced intake of high glycemic carbohydrates. - Continue Synjardy for diabetes management. - Monitor blood sugar levels regularly. - Evaluate A1c levels at the next visit.   Class 3 severe obesity with serious comorbidity and body mass index (BMI) of 40.0 to 44.9 in adult, unspecified obesity type [facility]) Assessment & Plan: She is undergoing medical weight management and has lost three pounds since the last visit. She adheres to the dietary plan 70-80% of the time, incorporating more whole foods, adequate protein, and maintaining hydration. Exercises four days a week, including strength training, cardio, and yoga, preserving muscle mass while reducing fat mass. She experiences inadequate sleep and elevated stress levels. Discussed the importance of  balanced meals, adequate protein intake, and the role of gut microbiome in weight management. She has maintained significant weight loss since 2016, indicating a change in her fat mass set point. Discussed that maintaining weight loss for 2-3 years can reset the fat mass set point, facilitating weight maintenance.  We discussed the role and limitations of GLP-1 therapy today. - Continue current dietary plan with emphasis on balanced meals and adequate protein intake. - Increase protein intake to 120-130 grams per day to aid satiety and muscle preservation. - Increase calorie intake by 200 calories on days with strenuous exercise if experiencing hunger. - Consider using My[employer] Diary app for easier meal tracking. - Follow up in 3-4 weeks for weight management review.          Objective   Physical Exam:  Blood pressure 113/74, pulse 62, temperature (!) 97.5 F (36.4 C), height 5\' 1"  (1.549 m), weight 210 lb (95.3 kg), SpO2 98%. Body mass index is 39.68 kg/m.  General: She is overweight, cooperative, alert, well developed, and in no acute distress. PSYCH: Has normal mood, affect and thought process.   HEENT: EOMI, sclerae are anicteric. Lungs: Normal breathing effort, no conversational dyspnea. Extremities: No edema.  Neurologic: No gross sensory or motor deficits. No tremors or fasciculations noted.    Diagnostic Data Reviewed:  BMET    Component Value Date/Time   NA 139 07/12/2023 0000   K 4.5 07/12/2023 0000   CL 100 07/12/2023 0000   CO2 22 07/12/2023  0000   GLUCOSE 140 (H) 04/04/2022 0000   BUN 15 07/12/2023 0000   CREATININE 0.6 07/12/2023 0000   CREATININE 0.72 04/04/2022 0000   CALCIUM 9.6 07/12/2023 0000   GFRNONAA 98 08/21/2019 0834   GFRAA 114 08/21/2019 0834   Lab Results  Component Value Date   HGBA1C 6.7 07/12/2023   HGBA1C 7.2 09/06/2011   Lab Results  Component Value Date   INSULIN 19.3 07/25/2023   Lab Results  Component Value Date   TSH 1.23  09/15/2022   CBC    Component Value Date/Time   WBC 6.6 07/12/2023 0000   WBC 7.9 04/04/2022 0000   RBC 5.09 07/12/2023 0000   HGB 15.3 07/12/2023 0000   HCT 46 07/12/2023 0000   PLT 183 07/12/2023 0000   MCV 91.1 04/04/2022 0000   MCH 30.6 04/04/2022 0000   MCHC 33.5 04/04/2022 0000   RDW 11.8 04/04/2022 0000   Iron Studies    Component Value Date/Time   FERRITIN 71 09/15/2022 0000   Lipid Panel     Component Value Date/Time   CHOL 183 07/25/2023 0754   TRIG 119 07/25/2023 0754   HDL 54 07/25/2023 0754   CHOLHDL 2.4 04/04/2022 0000   VLDL 25.0 09/25/2019 0920   LDLCALC 108 (H) 07/25/2023 0754   LDLCALC 87 04/04/2022 0000   LDLDIRECT 149 (H) 12/06/2011 1702   Hepatic Function Panel     Component Value Date/Time   PROT 7.0 04/04/2022 0000   ALBUMIN 4.5 07/12/2023 0000   AST 23 07/12/2023 0000   ALT 33 07/12/2023 0000   ALKPHOS 99 07/12/2023 0000   BILITOT 0.5 04/04/2022 0000      Component Value Date/Time   TSH 1.23 09/15/2022 0000   TSH 1.02 12/11/2018 1102   Nutritional Lab Results  Component Value Date   VD25OH 57.2 07/25/2023   VD25OH 38.8 09/15/2022    Medications: Outpatient Encounter Medications as of 09/06/2023  Medication Sig Note   ACCU-CHEK GUIDE test strip  11/26/2020: use   Acetaminophen (TYLENOL PO) Take by mouth as needed. Not sure the dose    amphetamine-dextroamphetamine (ADDERALL) 10 MG tablet Take 1 tablet by mouth daily as directed (Patient taking differently: Take 10 mg by mouth as needed. Only takes as needed)    bimekizumab-bkzx (BIMZELX) 160 MG/ML pen Inject 320 mg subcutaneously every 8 (eight) weeks MAINTENANCE DOSE: (Patient taking differently: Inject into the skin every 30 (thirty) days.)    bimekizumab-bkzx (BIMZELX) 160 MG/ML pen Inject 320 mg subcutaneously every 28 (twenty-eight) days LOADING DOSE: Inject 320mg  (two 160mg  injections) SubQ at week 0, 4, 8, 12, and 16    celecoxib (CELEBREX) 100 MG capsule Take 1 capsule (100  mg total) by mouth 2 (two) times daily.    clobetasol (TEMOVATE) 0.05 % external solution Apply topically 2 (two) times daily (Patient taking differently: Apply 1 Application topically as needed.)    clotrimazole-betamethasone (LOTRISONE) cream Apply to affected area(s) on skin 2 (two) times daily. (Patient taking differently: Apply 1 application  topically as needed.)    Continuous Glucose Sensor (FREESTYLE LIBRE 3 PLUS SENSOR) MISC Change sensor every 15 days.    desonide (DESOWEN) 0.05 % cream Apply 1 application topically 2 (two) times daily. (Patient taking differently: Apply 1 Application topically as needed.)    Empagliflozin-metFORMIN HCl ER (SYNJARDY XR) 03-999 MG TB24 Take 1 tablet by mouth daily with breakfast.    glucose blood (FREESTYLE LITE) test strip Use to check blood sugar 3 times a day  lisdexamfetamine (VYVANSE) 60 MG capsule Take 1 capsule by mouth daily    Naltrexone HCl, Pain, 4.5 MG CAPS Take 1 tablet by mouth every morning.    Omega-3 Fatty Acids (FISH OIL) 1200 MG CAPS Take 1,200-2,400 mg by mouth 2 (two) times daily. Takes 2400 mg qam and 1200 mg qpm    traZODone (DESYREL) 50 MG tablet Take 1-2 tablets (50-100 mg total) by mouth at bedtime.    TURMERIC PO Take 1 tablet by mouth as needed.    No facility-administered encounter medications on file as of 09/06/2023.     Follow-Up   Return in about 3 weeks (around 09/27/2023) for For Weight Mangement with Dr. Rikki Spearing.Marland Kitchen She was informed of the importance of frequent follow up visits to maximize her success with intensive lifestyle modifications for her multiple health conditions.  Attestation Statement   Reviewed by clinician on day of visit: allergies, medications, problem list, medical history, surgical history, family history, social history, and previous encounter notes.   I have spent 30 minutes in the care of the patient today including: preparing to see patient (e.g. review and interpretation of tests, old  notes ), obtaining and/or reviewing separately obtained history, performing a medically appropriate examination or evaluation, counseling and educating the patient, documenting clinical information in the electronic or other health care record, and independently interpreting results and communicating results to the patient, family, or caregiver   Worthy Rancher, MD

## 2023-09-06 NOTE — Assessment & Plan Note (Signed)
 She is on Synjardy for diabetes management. Her blood sugars have improved, with nighttime levels dropping from 140-150s to 110s, and sometimes below 100. Her fasting blood sugar is now around 120, which is an improvement attributed to reduced intake of high glycemic carbohydrates. - Continue Synjardy for diabetes management. - Monitor blood sugar levels regularly. - Evaluate A1c levels at the next visit.

## 2023-09-06 NOTE — Assessment & Plan Note (Signed)
 On CPAP with reported good compliance. Continue PAP therapy. Losing 15% or more of body weight may improve AHI.

## 2023-09-06 NOTE — Assessment & Plan Note (Signed)
 She is undergoing medical weight management and has lost three pounds since the last visit. She adheres to the dietary plan 70-80% of the time, incorporating more whole foods, adequate protein, and maintaining hydration. Exercises four days a week, including strength training, cardio, and yoga, preserving muscle mass while reducing fat mass. She experiences inadequate sleep and elevated stress levels. Discussed the importance of balanced meals, adequate protein intake, and the role of gut microbiome in weight management. She has maintained significant weight loss since 2016, indicating a change in her fat mass set point. Discussed that maintaining weight loss for 2-3 years can reset the fat mass set point, facilitating weight maintenance.  We discussed the role and limitations of GLP-1 therapy today. - Continue current dietary plan with emphasis on balanced meals and adequate protein intake. - Increase protein intake to 120-130 grams per day to aid satiety and muscle preservation. - Increase calorie intake by 200 calories on days with strenuous exercise if experiencing hunger. - Consider using MyNet Diary app for easier meal tracking. - Follow up in 3-4 weeks for weight management review.

## 2023-09-07 ENCOUNTER — Other Ambulatory Visit: Payer: Self-pay | Admitting: Family Medicine

## 2023-09-07 ENCOUNTER — Other Ambulatory Visit (HOSPITAL_COMMUNITY): Payer: Self-pay

## 2023-09-07 ENCOUNTER — Other Ambulatory Visit: Payer: Self-pay

## 2023-09-07 MED ORDER — FREESTYLE LIBRE 3 PLUS SENSOR MISC
0 refills | Status: DC
  Filled 2023-09-07: qty 6, 90d supply, fill #0

## 2023-09-07 MED ORDER — LISDEXAMFETAMINE DIMESYLATE 60 MG PO CAPS
60.0000 mg | ORAL_CAPSULE | Freq: Every day | ORAL | 0 refills | Status: DC
  Filled 2023-09-07: qty 30, 30d supply, fill #0

## 2023-09-07 NOTE — Progress Notes (Signed)
 [name] [address] [place] [address] Phone: [phone] Subjective:   [name], am serving as a scribe for Dr. Antoine Primas.  I'm seeing this patient by the request  of:  Everrett Coombe, DO  CC: Back and neck pain follow-up  [id number]  Tabitha [name] is a 58 y.o. female coming in with complaint of back and neck pain. OMT on 08/14/2023. Patient states same per usual. R thumb pain a little swollen.  Medications patient has been prescribed:   Taking:    Since we have seen patient was seen by [facility].  Has been down 6 pounds since visiting them.     Reviewed prior external information including notes and imaging from previsou exam, outside providers and external EMR if available.   As well as notes that were available from care everywhere and other healthcare systems.  Past medical history, social, surgical and family history all reviewed in electronic medical record.  No pertanent information unless stated regarding to the chief complaint.   Past Medical History:  Diagnosis Date   A-fib (HCC)    Alcohol abuse    Anemia    Arthritis    Asthma    Back pain    De Quervain's disease (tenosynovitis)    Dermatophytosis, scalp    Diabetes mellitus    Drug use    Edema of both lower extremities    Fatty liver    Female infertility    Gallbladder problem    Hypercholesteremia    Joint pain    Obesity    Osteoarthritis    Palpitations    PCOS (polycystic ovarian syndrome)    Polycystic ovarian disease    Psoriatic arthritis (HCC)    SOB (shortness of breath)    Vitamin D deficiency    Wears glasses     Allergies  Allergen Reactions   Chocolate Anaphylaxis   Corn-Containing Products Nausea Only    Fatigue, body aches, upset stomach,   Food     chocolate   Gluten Meal    Latex Rash   Methotrexate Rash     Review of Systems:  No headache, visual changes, nausea, vomiting, diarrhea,  constipation, dizziness, abdominal pain, skin rash, fevers, chills, night sweats, weight loss, swollen lymph nodes, body aches, joint swelling, chest pain, shortness of breath, mood changes. POSITIVE muscle aches  Objective  Blood pressure 124/70, pulse 80, height 5\' 1"  (1.549 m), SpO2 97%.   General: No apparent distress alert and oriented x3 mood and affect normal, dressed appropriately.  HEENT: Pupils equal, extraocular movements intact  Respiratory: Patient's speak in full sentences and does not appear short of breath  Cardiovascular: No lower extremity edema, non tender, no erythema  Gait MSK:  Back does have some loss lordosis noted.  Some tenderness in the paraspinal musculature.  Tightness with FABER bilaterally.  Still working on core strength.  Right wrist does have a positive Designer, industrial/product findings  C2 flexed rotated and side bent right C6 flexed rotated and side bent left T3 extended rotated and side bent right inhaled rib T9 extended rotated and side bent left L2 flexed rotated and side bent right Sacrum right on right       Assessment and Plan:  De Quervain's tenosynovitis, right Contralateral side did not need surgical intervention.  Discussed bracing with a thumb spica, discussed icing regimen, home exercises, discussed avoiding certain repetitive activities.  Follow-up again in 6 to 8  weeks  Low back pain Continue to stay active.  Discussed icing regimen and home exercises, posture and ergonomics, increasing activity slowly.  Follow-up again in 6 to 8 weeks.    Nonallopathic problems  Decision today to treat with OMT was based on Physical Exam  After verbal consent patient was treated with HVLA, ME, FPR techniques in cervical, rib, thoracic, lumbar, and sacral  areas  Patient tolerated the procedure well with improvement in symptoms  Patient given exercises, stretches and lifestyle modifications  See medications in patient instructions if  given  Patient will follow up in 4-8 weeks    The above documentation has been reviewed and is accurate and complete [name], DO          Note: This dictation was prepared with Dragon dictation along with smaller phrase technology. Any transcriptional errors that result from this process are unintentional.

## 2023-09-10 DIAGNOSIS — F4322 Adjustment disorder with anxiety: Secondary | ICD-10-CM | POA: Diagnosis not present

## 2023-09-13 ENCOUNTER — Ambulatory Visit: Admitting: Family Medicine

## 2023-09-13 ENCOUNTER — Encounter: Payer: Self-pay | Admitting: Family Medicine

## 2023-09-13 VITALS — BP 124/70 | HR 80 | Ht 61.0 in

## 2023-09-13 DIAGNOSIS — M9904 Segmental and somatic dysfunction of sacral region: Secondary | ICD-10-CM | POA: Diagnosis not present

## 2023-09-13 DIAGNOSIS — M9903 Segmental and somatic dysfunction of lumbar region: Secondary | ICD-10-CM | POA: Diagnosis not present

## 2023-09-13 DIAGNOSIS — M654 Radial styloid tenosynovitis [de Quervain]: Secondary | ICD-10-CM

## 2023-09-13 DIAGNOSIS — M545 Low back pain, unspecified: Secondary | ICD-10-CM | POA: Diagnosis not present

## 2023-09-13 DIAGNOSIS — M9902 Segmental and somatic dysfunction of thoracic region: Secondary | ICD-10-CM

## 2023-09-13 DIAGNOSIS — G8929 Other chronic pain: Secondary | ICD-10-CM | POA: Diagnosis not present

## 2023-09-13 DIAGNOSIS — M9901 Segmental and somatic dysfunction of cervical region: Secondary | ICD-10-CM | POA: Diagnosis not present

## 2023-09-13 DIAGNOSIS — M9908 Segmental and somatic dysfunction of rib cage: Secondary | ICD-10-CM | POA: Diagnosis not present

## 2023-09-13 NOTE — Assessment & Plan Note (Signed)
 Contralateral side did not need surgical intervention.  Discussed bracing with a thumb spica, discussed icing regimen, home exercises, discussed avoiding certain repetitive activities.  Follow-up again in 6 to 8 weeks

## 2023-09-13 NOTE — Patient Instructions (Signed)
 Good to see you!  Do prescribed exercises at least 3x a week See you again in 6-8 weeks

## 2023-09-13 NOTE — Assessment & Plan Note (Signed)
 Continue to stay active.  Discussed icing regimen and home exercises, posture and ergonomics, increasing activity slowly.  Follow-up again in 6 to 8 weeks.

## 2023-09-24 ENCOUNTER — Encounter (INDEPENDENT_AMBULATORY_CARE_PROVIDER_SITE_OTHER): Payer: Self-pay | Admitting: Internal Medicine

## 2023-09-24 ENCOUNTER — Ambulatory Visit (INDEPENDENT_AMBULATORY_CARE_PROVIDER_SITE_OTHER): Admitting: Internal Medicine

## 2023-09-24 ENCOUNTER — Other Ambulatory Visit (HOSPITAL_COMMUNITY): Payer: Self-pay

## 2023-09-24 VITALS — BP 120/61 | HR 65 | Temp 98.3°F | Ht 61.0 in | Wt 213.0 lb

## 2023-09-24 DIAGNOSIS — G4733 Obstructive sleep apnea (adult) (pediatric): Secondary | ICD-10-CM | POA: Diagnosis not present

## 2023-09-24 DIAGNOSIS — E1169 Type 2 diabetes mellitus with other specified complication: Secondary | ICD-10-CM

## 2023-09-24 DIAGNOSIS — F4322 Adjustment disorder with anxiety: Secondary | ICD-10-CM | POA: Diagnosis not present

## 2023-09-24 DIAGNOSIS — Z6841 Body Mass Index (BMI) 40.0 and over, adult: Secondary | ICD-10-CM

## 2023-09-24 DIAGNOSIS — E66813 Obesity, class 3: Secondary | ICD-10-CM

## 2023-09-24 DIAGNOSIS — Z7984 Long term (current) use of oral hypoglycemic drugs: Secondary | ICD-10-CM | POA: Diagnosis not present

## 2023-09-24 MED ORDER — METFORMIN HCL ER 500 MG PO TB24
500.0000 mg | ORAL_TABLET | Freq: Every evening | ORAL | 0 refills | Status: DC
  Filled 2023-09-24: qty 30, 30d supply, fill #0

## 2023-09-24 NOTE — Assessment & Plan Note (Signed)
 On CPAP with reported good compliance. Continue PAP therapy. Losing 15% or more of body weight may improve AHI.

## 2023-09-24 NOTE — Assessment & Plan Note (Signed)
 She presents for medical weight management with a 3-pound weight gain since the last visit. She follows her meal plan 50-60% of the time, does not track calories, and experiences increased stress and inadequate sleep. She engages in physical activity three days a week. Discussed the importance of avoiding an all-or-none mindset and making small adjustments to diet and lifestyle. Emphasized creating a supportive home environment and avoiding trigger foods. Counsel on avoiding an all-or-none mindset, encourage meal prepping, focus on healthier food choices, continue physical activity three days a week, and create a supportive home environment.

## 2023-09-24 NOTE — Assessment & Plan Note (Signed)
 She is on Synjardy for diabetes management. Her blood sugars have improved. - Continue Synjardy for diabetes management. - Start metformin XR 500 mg in the evening for weight management - Monitor blood sugar levels regularly. - Evaluate A1c levels at the next visit.

## 2023-09-24 NOTE — Progress Notes (Signed)
 Office: [phone]  /  Fax: [phone]  Weight Summary And Biometrics  Vitals Temp: 98.3 F (36.8 C) BP: 120/61 Pulse Rate: 65 SpO2: 100 %   Anthropometric Measurements Height: 5\' 1"  (1.549 m) Weight: 213 lb (96.6 kg) BMI (Calculated): 40.27 Weight at Last Visit: 210 lb Weight Lost Since Last Visit: 0 lb Weight Gained Since Last Visit: 3 lb Starting Weight: 218 lb Total Weight Loss (lbs): 3 lb (1.361 kg) Peak Weight: 280 lb   Body Composition  Body Fat %: 45.2 % Fat Mass (lbs): 96.4 lbs Muscle Mass (lbs): 111 lbs Total Body Water (lbs): 75.4 lbs Visceral Fat Rating : 14    RMR: 1886  Today's Visit #: 5  Starting Date: 07/12/23   Subjective   Chief Complaint: Obesity  Interval History Discussed the use of AI scribe software for clinical note transcription with the patient, who gave verbal consent to proceed.   History of Present Illness   Tabitha [name] "Tabitha [name]" is a 58 year old female with type 2 diabetes, obstructive sleep apnea, and atrial fibrillation who presents for medical weight management.  She has gained three pounds since her last visit and adheres to her category two meal plan 50-60% of the time without tracking calories. She consumes more whole foods, meets the recommended protein intake, and maintains adequate hydration but skips meals. She exercises three days a week for 30-60 minutes, incorporating both strength and cardio exercises.  She experiences inadequate sleep and increased stress levels, attributed to work stress and recent holiday preparations. She uses a CPAP machine nightly for her obstructive sleep apnea. Her husband usually prepares meals during the day, but due to his stress, the meals have been inconsistent.  She feels her body is 'fairly inflamed' and her psoriasis is flaring up. Her gut feels 'kind of messed up' and her psoriasis has been worsening over the last three weeks.  She experiences nasal symptoms and runny eyes  after eating, suspecting gluten sensitivity, as she has noticed changes in her bowel habits after consuming gluten-containing foods. Her home environment and availability of certain foods can trigger cravings, leading to increased consumption of snacks, such as nuts, due to convenience.        Challenges affecting patient progress: none.    Pharmacotherapy for weight management: She is currently taking  Synjardy for type 2 diabetes .   Assessment and Plan   Treatment Plan For Obesity:  Recommended Dietary Goals  Tabitha [name] is currently in the action stage of change. As such, her goal is to continue weight management plan. She has agreed to: continue current plan  Behavioral Health and Counseling  We discussed the following behavioral modification strategies today: continue to work on maintaining a reduced calorie state, getting the recommended amount of protein, incorporating whole foods, making healthy choices, staying well hydrated and practicing mindfulness when eating..  Additional education and resources provided today: None  Recommended Physical Activity Goals  Tabitha [name] has been advised to work up to 150 minutes of moderate intensity aerobic activity a week and strengthening exercises 2-3 times per week for cardiovascular health, weight loss maintenance and preservation of muscle mass.   She has agreed to :  Think about enjoyable ways to increase daily physical activity and overcoming barriers to exercise and Increase physical activity in their day and reduce sedentary time (increase NEAT).  Pharmacotherapy  We discussed various medication options to help [name] with her weight loss efforts and we both agreed to :  Will add metformin XR 500 mg in the evening  Associated Conditions Impacted by Obesity Treatment  OSA (obstructive sleep apnea) Assessment & Plan: On CPAP with reported good compliance. Continue PAP therapy. Losing 15% or more of body weight may improve AHI.       Type 2 diabetes mellitus with other specified complication, without long-term current use of insulin [facility]) Assessment & Plan: She is on Synjardy for diabetes management. Her blood sugars have improved. - Continue Synjardy for diabetes management. - Start metformin XR 500 mg in the evening for weight management - Monitor blood sugar levels regularly. - Evaluate A1c levels at the next visit.  Orders: -     metFORMIN HCl ER; Take 1 tablet (500 mg total) by mouth every evening.  Dispense: 30 tablet; Refill: 0  Class 3 severe obesity with serious comorbidity and body mass index (BMI) of 40.0 to 44.9 in adult, unspecified obesity type [facility]) Assessment & Plan: She presents for medical weight management with a 3-pound weight gain since the last visit. She follows her meal plan 50-60% of the time, does not track calories, and experiences increased stress and inadequate sleep. She engages in physical activity three days a week. Discussed the importance of avoiding an all-or-none mindset and making small adjustments to diet and lifestyle. Emphasized creating a supportive home environment and avoiding trigger foods. Counsel on avoiding an all-or-none mindset, encourage meal prepping, focus on healthier food choices, continue physical activity three days a week, and create a supportive home environment.              Objective   Physical Exam:  Blood pressure 120/61, pulse 65, temperature 98.3 F (36.8 C), height 5\' 1"  (1.549 m), weight 213 lb (96.6 kg), SpO2 100%. Body mass index is 40.25 kg/m.  General: She is overweight, cooperative, alert, well developed, and in no acute distress. PSYCH: Has normal mood, affect and thought process.   HEENT: EOMI, sclerae are anicteric. Lungs: Normal breathing effort, no conversational dyspnea. Extremities: No edema.  Neurologic: No gross sensory or motor deficits. No tremors or fasciculations noted.    Diagnostic Data Reviewed:  BMET     Component Value Date/Time   NA 139 07/12/2023 0000   K 4.5 07/12/2023 0000   CL 100 07/12/2023 0000   CO2 22 07/12/2023 0000   GLUCOSE 140 (H) 04/04/2022 0000   BUN 15 07/12/2023 0000   CREATININE 0.6 07/12/2023 0000   CREATININE 0.72 04/04/2022 0000   CALCIUM 9.6 07/12/2023 0000   GFRNONAA 98 08/21/2019 0834   GFRAA 114 08/21/2019 0834   Lab Results  Component Value Date   HGBA1C 6.7 07/12/2023   HGBA1C 7.2 09/06/2011   Lab Results  Component Value Date   INSULIN 19.3 07/25/2023   Lab Results  Component Value Date   TSH 1.23 09/15/2022   CBC    Component Value Date/Time   WBC 6.6 07/12/2023 0000   WBC 7.9 04/04/2022 0000   RBC 5.09 07/12/2023 0000   HGB 15.3 07/12/2023 0000   HCT 46 07/12/2023 0000   PLT 183 07/12/2023 0000   MCV 91.1 04/04/2022 0000   MCH 30.6 04/04/2022 0000   MCHC 33.5 04/04/2022 0000   RDW 11.8 04/04/2022 0000   Iron Studies    Component Value Date/Time   FERRITIN 71 09/15/2022 0000   Lipid Panel     Component Value Date/Time   CHOL 183 07/25/2023 0754   TRIG 119 07/25/2023 0754   HDL 54 07/25/2023 0754  CHOLHDL 2.4 04/04/2022 0000   VLDL 25.0 09/25/2019 0920   LDLCALC 108 (H) 07/25/2023 0754   LDLCALC 87 04/04/2022 0000   LDLDIRECT 149 (H) 12/06/2011 1702   Hepatic Function Panel     Component Value Date/Time   PROT 7.0 04/04/2022 0000   ALBUMIN 4.5 07/12/2023 0000   AST 23 07/12/2023 0000   ALT 33 07/12/2023 0000   ALKPHOS 99 07/12/2023 0000   BILITOT 0.5 04/04/2022 0000      Component Value Date/Time   TSH 1.23 09/15/2022 0000   TSH 1.02 12/11/2018 1102   Nutritional Lab Results  Component Value Date   VD25OH 57.2 07/25/2023   VD25OH 38.8 09/15/2022    Medications: Outpatient Encounter Medications as of 09/24/2023  Medication Sig Note   ACCU-CHEK GUIDE test strip  11/26/2020: use   Acetaminophen (TYLENOL PO) Take by mouth as needed. Not sure the dose    amphetamine-dextroamphetamine (ADDERALL) 10 MG tablet  Take 1 tablet by mouth daily as directed (Patient taking differently: Take 10 mg by mouth as needed. Only takes as needed)    bimekizumab-bkzx (BIMZELX) 160 MG/ML pen Inject 320 mg subcutaneously every 8 (eight) weeks MAINTENANCE DOSE: (Patient taking differently: Inject into the skin every 30 (thirty) days.)    bimekizumab-bkzx (BIMZELX) 160 MG/ML pen Inject 320 mg subcutaneously every 28 (twenty-eight) days LOADING DOSE: Inject 320mg  (two 160mg  injections) SubQ at week 0, 4, 8, 12, and 16    celecoxib (CELEBREX) 100 MG capsule Take 1 capsule (100 mg total) by mouth 2 (two) times daily.    clobetasol (TEMOVATE) 0.05 % external solution Apply topically 2 (two) times daily (Patient taking differently: Apply 1 Application topically as needed.)    clotrimazole-betamethasone (LOTRISONE) cream Apply to affected area(s) on skin 2 (two) times daily. (Patient taking differently: Apply 1 application  topically as needed.)    Continuous Glucose Sensor (FREESTYLE LIBRE 3 PLUS SENSOR) MISC Change sensor every 15 days.    desonide (DESOWEN) 0.05 % cream Apply 1 application topically 2 (two) times daily. (Patient taking differently: Apply 1 Application topically as needed.)    Empagliflozin-metFORMIN HCl ER (SYNJARDY XR) 03-999 MG TB24 Take 1 tablet by mouth daily with breakfast.    glucose blood (FREESTYLE LITE) test strip Use to check blood sugar 3 times a day    lisdexamfetamine (VYVANSE) 60 MG capsule Take 1 capsule by mouth daily    metFORMIN (GLUCOPHAGE-XR) 500 MG 24 hr tablet Take 1 tablet (500 mg total) by mouth every evening.    Naltrexone HCl, Pain, 4.5 MG CAPS Take 1 tablet by mouth every morning.    Omega-3 Fatty Acids (FISH OIL) 1200 MG CAPS Take 1,200-2,400 mg by mouth 2 (two) times daily. Takes 2400 mg qam and 1200 mg qpm    traZODone (DESYREL) 50 MG tablet Take 1-2 tablets (50-100 mg total) by mouth at bedtime.    TURMERIC PO Take 1 tablet by mouth as needed.    No facility-administered encounter  medications on file as of 09/24/2023.     Follow-Up   Return in about 4 weeks (around 10/22/2023) for For Weight Mangement with Dr. Rikki Spearing.Marland Kitchen She was informed of the importance of frequent follow up visits to maximize her success with intensive lifestyle modifications for her multiple health conditions.  Attestation Statement   Reviewed by clinician on day of visit: allergies, medications, problem list, medical history, surgical history, family history, social history, and previous encounter notes.     Worthy Rancher, MD

## 2023-09-27 DIAGNOSIS — G4733 Obstructive sleep apnea (adult) (pediatric): Secondary | ICD-10-CM | POA: Diagnosis not present

## 2023-10-01 DIAGNOSIS — F4322 Adjustment disorder with anxiety: Secondary | ICD-10-CM | POA: Diagnosis not present

## 2023-10-08 ENCOUNTER — Other Ambulatory Visit (HOSPITAL_COMMUNITY): Payer: Self-pay

## 2023-10-08 DIAGNOSIS — F4322 Adjustment disorder with anxiety: Secondary | ICD-10-CM | POA: Diagnosis not present

## 2023-10-08 MED ORDER — LISDEXAMFETAMINE DIMESYLATE 60 MG PO CAPS
60.0000 mg | ORAL_CAPSULE | Freq: Every day | ORAL | 0 refills | Status: DC
  Filled 2023-10-08: qty 30, 30d supply, fill #0

## 2023-10-10 ENCOUNTER — Other Ambulatory Visit (HOSPITAL_COMMUNITY): Payer: Self-pay

## 2023-10-15 DIAGNOSIS — F4322 Adjustment disorder with anxiety: Secondary | ICD-10-CM | POA: Diagnosis not present

## 2023-10-16 ENCOUNTER — Other Ambulatory Visit: Payer: Self-pay

## 2023-10-16 ENCOUNTER — Ambulatory Visit (INDEPENDENT_AMBULATORY_CARE_PROVIDER_SITE_OTHER): Admitting: Internal Medicine

## 2023-10-16 ENCOUNTER — Ambulatory Visit: Admitting: Family Medicine

## 2023-10-16 ENCOUNTER — Encounter: Payer: Self-pay | Admitting: Family Medicine

## 2023-10-16 VITALS — BP 126/74 | HR 105 | Ht 61.0 in

## 2023-10-16 DIAGNOSIS — M9903 Segmental and somatic dysfunction of lumbar region: Secondary | ICD-10-CM | POA: Diagnosis not present

## 2023-10-16 DIAGNOSIS — M25511 Pain in right shoulder: Secondary | ICD-10-CM | POA: Diagnosis not present

## 2023-10-16 DIAGNOSIS — M9904 Segmental and somatic dysfunction of sacral region: Secondary | ICD-10-CM | POA: Diagnosis not present

## 2023-10-16 DIAGNOSIS — M19041 Primary osteoarthritis, right hand: Secondary | ICD-10-CM

## 2023-10-16 DIAGNOSIS — M19011 Primary osteoarthritis, right shoulder: Secondary | ICD-10-CM | POA: Diagnosis not present

## 2023-10-16 DIAGNOSIS — M7551 Bursitis of right shoulder: Secondary | ICD-10-CM

## 2023-10-16 DIAGNOSIS — M19012 Primary osteoarthritis, left shoulder: Secondary | ICD-10-CM

## 2023-10-16 DIAGNOSIS — M19042 Primary osteoarthritis, left hand: Secondary | ICD-10-CM

## 2023-10-16 DIAGNOSIS — M9901 Segmental and somatic dysfunction of cervical region: Secondary | ICD-10-CM | POA: Diagnosis not present

## 2023-10-16 DIAGNOSIS — M9902 Segmental and somatic dysfunction of thoracic region: Secondary | ICD-10-CM | POA: Diagnosis not present

## 2023-10-16 DIAGNOSIS — M9908 Segmental and somatic dysfunction of rib cage: Secondary | ICD-10-CM

## 2023-10-16 NOTE — Assessment & Plan Note (Signed)
 Given injection today.  Tolerated the procedure well with near complete resolution of pain immediately.  Follow-up again in 6 to 8 weeks

## 2023-10-16 NOTE — Patient Instructions (Signed)
 Injection in R shoulder and R thumb Good to see you! See you again at next appointment

## 2023-10-16 NOTE — Assessment & Plan Note (Signed)
 Chronic problem with exacerbation that has been over 2 years since any injection has been done.  Discussed icing regimen and home exercises, increase activity slowly.  Discussed home exercises.  Follow-up again in 6 to 8 weeks

## 2023-10-16 NOTE — Progress Notes (Addendum)
 [facility] [address] [place] [address] Phone: [phone] Subjective:   IBryan Caprio, am serving as a scribe for Dr. Ronnell Coins.  I'm seeing this patient by the request  of:  Adela Holter, DO  CC: Back and neck pain follow-up  [id number]  Tabitha [name] is a 58 y.o. female coming in with complaint of back and neck pain. OMT on 09/13/2023. Patient states hurt R shoulder doing yard work. Ice and celebrex  when needed.  Medications patient has been prescribed:   Taking:         Reviewed prior external information including notes and imaging from previsou exam, outside providers and external EMR if available.   As well as notes that were available from care everywhere and other healthcare systems.  Past medical history, social, surgical and family history all reviewed in electronic medical record.  No pertanent information unless stated regarding to the chief complaint.   Past Medical History:  Diagnosis Date   A-fib (HCC)    Alcohol abuse    Anemia    Arthritis    Asthma    Back pain    De Quervain's disease (tenosynovitis)    Dermatophytosis, scalp    Diabetes mellitus    Drug use    Edema of both lower extremities    Fatty liver    Female infertility    Gallbladder problem    Hypercholesteremia    Joint pain    Obesity    Osteoarthritis    Palpitations    PCOS (polycystic ovarian syndrome)    Polycystic ovarian disease    Psoriatic arthritis (HCC)    SOB (shortness of breath)    Vitamin D  deficiency    Wears glasses     Allergies  Allergen Reactions   Chocolate Anaphylaxis   Corn-Containing Products Nausea Only    Fatigue, body aches, upset stomach,   Food     chocolate   Gluten Meal    Latex Rash   Methotrexate  Rash     Review of Systems:  No headache, visual changes, nausea, vomiting, diarrhea, constipation, dizziness, abdominal pain, skin rash, fevers, chills, night sweats, weight loss, swollen  lymph nodes, body aches, joint swelling, chest pain, shortness of breath, mood changes. POSITIVE muscle aches  Objective  Blood pressure 126/74, pulse (!) 105, height 5\' 1"  (1.549 m), SpO2 98%.   General: No apparent distress alert and oriented x3 mood and affect normal, dressed appropriately.  HEENT: Pupils equal, extraocular movements intact  Respiratory: Patient's speak in full sentences and does not appear short of breath  Cardiovascular: No lower extremity edema, non tender, no erythema  Gait relatively normal MSK:  Right shoulder does have positive impingement noted.  Fairly severe overall.  Positive crossover noted.  Rotator cuff strength appears to be intact Back significant tightness noted more in the thoracolumbar juncture but also in the lumbosacral area.  Neck exam does have some limited sidebending bilaterally.  Procedure: Real-time Ultrasound Guided Injection of right subacromial space shoulder. Device: GE Logiq Q7  Ultrasound guided injection is preferred based studies that show increased duration, increased effect, greater accuracy, decreased procedural pain, increased response rate with ultrasound guided versus blind injection.  Verbal informed consent obtained.  Time-out conducted.  Noted no overlying erythema, induration, or other signs of local infection.  Skin prepped in a sterile fashion.  Local anesthesia: Topical Ethyl chloride.  With sterile technique and under real time ultrasound guidance:  Joint visualized.  23g  1  inch needle inserted posterior approach. Pictures taken for needle placement. Patient did have injection of 2 cc of 0.5% Marcaine , and 1.0 cc of Kenalog  40 mg/dL. Completed without difficulty  Pain immediately resolved suggesting accurate placement of the medication.  Advised to call if fevers/chills, erythema, induration, drainage, or persistent bleeding.  Images stored Impression: Technically successful ultrasound guided injection.  Procedure:  Real-time Ultrasound Guided Injection of right acromioclavicular joint Device: GE Logiq Q7 Ultrasound guided injection is preferred based studies that show increased duration, increased effect, greater accuracy, decreased procedural pain, increased response rate, and decreased cost with ultrasound guided versus blind injection.  Verbal informed consent obtained.  Time-out conducted.  Noted no overlying erythema, induration, or other signs of local infection.  Skin prepped in a sterile fashion.  Local anesthesia: Topical Ethyl chloride.  With sterile technique and under real time ultrasound guidance: With a 25-gauge half inch needle injected with 0.5 cc of 0.5% Marcaine  and 0.5 cc of Kenalog  40 mg/mL Completed without difficulty  Pain immediately resolved suggesting accurate placement of the medication.  Advised to call if fevers/chills, erythema, induration, drainage, or persistent bleeding.  Impression: Technically successful ultrasound guided injection.  Osteopathic findings  C2 flexed rotated and side bent right C3 flexed rotated and side bent left C6 flexed rotated and side bent left T3 extended rotated and side bent right inhaled rib T9 extended rotated and side bent left L2 flexed rotated and side bent right Sacrum right on right       Assessment and Plan:  Arthritis of both acromioclavicular joints Chronic problem with exacerbation that has been over 2 years since any injection has been done.  Discussed icing regimen and home exercises, increase activity slowly.  Discussed home exercises.  Follow-up again in 6 to 8 weeks  Acute bursitis of right shoulder Given injection today.  Tolerated the procedure well with near complete resolution of pain immediately.  Follow-up again in 6 to 8 weeks    Nonallopathic problems  Decision today to treat with OMT was based on Physical Exam  After verbal consent patient was treated with HVLA, ME, FPR techniques in cervical, rib,  thoracic, lumbar, and sacral  areas  Patient tolerated the procedure well with improvement in symptoms  Patient given exercises, stretches and lifestyle modifications  See medications in patient instructions if given  Patient will follow up in 4-8 weeks     The above documentation has been reviewed and is accurate and complete Meriah Shands M Arlan Birks, DO         Note: This dictation was prepared with Dragon dictation along with smaller phrase technology. Any transcriptional errors that result from this process are unintentional.

## 2023-10-17 ENCOUNTER — Other Ambulatory Visit: Payer: Self-pay

## 2023-10-18 ENCOUNTER — Other Ambulatory Visit: Payer: Self-pay

## 2023-10-18 ENCOUNTER — Other Ambulatory Visit (HOSPITAL_COMMUNITY): Payer: Self-pay

## 2023-10-18 NOTE — Progress Notes (Signed)
 Specialty Pharmacy Refill Coordination Note  Tabitha [name] is a 58 y.o. female contacted today regarding refills of specialty medication(s) Bimekizumab -bkzx (Bimzelx )   Patient requested (Patient-Rptd) Pickup at [facility] date: (Patient-Rptd) 10/23/23   Medication will be filled on 10/22/23.

## 2023-10-22 ENCOUNTER — Other Ambulatory Visit: Payer: Self-pay

## 2023-10-22 ENCOUNTER — Other Ambulatory Visit (HOSPITAL_COMMUNITY): Payer: Self-pay

## 2023-10-23 ENCOUNTER — Other Ambulatory Visit: Payer: Self-pay

## 2023-10-23 ENCOUNTER — Other Ambulatory Visit (INDEPENDENT_AMBULATORY_CARE_PROVIDER_SITE_OTHER): Payer: Self-pay | Admitting: Internal Medicine

## 2023-10-23 DIAGNOSIS — E1169 Type 2 diabetes mellitus with other specified complication: Secondary | ICD-10-CM

## 2023-10-26 NOTE — Progress Notes (Signed)
 [facility] [address] [place] [address] Phone: [phone] Subjective:   Tabitha [name], am serving as a scribe for Dr. Ronnell Coins.  I'm seeing this patient by the request  of:  Adela Holter, DO  CC: Low back pain follow-up  [id number]  Tabitha [name] is a 58 y.o. female coming in with complaint of back and neck pain. OMT on 10/16/2023. Patient states that her neck is tight and painful today.   Shoulder pops with flexion but it is less painful.   Thumb pain is intermittent but manageable. Has some weakness but that has improved as well.   Leaving for river cruise in 2 weeks.   Medications patient has been prescribed:   Taking:         Reviewed prior external information including notes and imaging from previsou exam, outside providers and external EMR if available.   As well as notes that were available from care everywhere and other healthcare systems.  Past medical history, social, surgical and family history all reviewed in electronic medical record.  No pertanent information unless stated regarding to the chief complaint.   Past Medical History:  Diagnosis Date   A-fib (HCC)    Alcohol abuse    Anemia    Arthritis    Asthma    Back pain    De Quervain's disease (tenosynovitis)    Dermatophytosis, scalp    Diabetes mellitus    Drug use    Edema of both lower extremities    Fatty liver    Female infertility    Gallbladder problem    Hypercholesteremia    Joint pain    Obesity    Osteoarthritis    Palpitations    PCOS (polycystic ovarian syndrome)    Polycystic ovarian disease    Psoriatic arthritis (HCC)    SOB (shortness of breath)    Vitamin D  deficiency    Wears glasses     Allergies  Allergen Reactions   Chocolate Anaphylaxis   Corn-Containing Products Nausea Only    Fatigue, body aches, upset stomach,   Food     chocolate   Gluten Meal    Latex Rash   Methotrexate  Rash     Review of  Systems:  No headache, visual changes, nausea, vomiting, diarrhea, constipation, dizziness, abdominal pain, skin rash, fevers, chills, night sweats, weight loss, swollen lymph nodes, body aches, joint swelling, chest pain, shortness of breath, mood changes. POSITIVE muscle aches  Objective  Blood pressure 128/82, pulse 86, height 5\' 1"  (1.549 m), weight 213 lb (96.6 kg), SpO2 98%.   General: No apparent distress alert and oriented x3 mood and affect normal, dressed appropriately.  HEENT: Pupils equal, extraocular movements intact  Respiratory: Patient's speak in full sentences and does not appear short of breath  Cardiovascular: No lower extremity edema, non tender, no erythema  Gait MSK:  Back does have some loss of lordosis noted.  Some tenderness to palpation in the paraspinal musculature.  Tightness with Veldon German right greater than left.  Negative straight leg test noted.  Osteopathic findings   T6 extended rotated and side bent left L1 flexed rotated and side bent right L3 flexed rotated and side bent left Sacrum right on right       Assessment and Plan:  Low back pain Chronic problem but doing relatively well.  Watching weight closely.  Continuing to get stronger.  Does have the underlying psoriatic arthritis, will be traveling out of the  country, prednisone  40 mg for 7 days prescribed.  Follow-up again 6 to 8 weeks.  After evaluation did respond well to osteopathic manipulation.    Nonallopathic problems  Decision today to treat with OMT was based on Physical Exam  After verbal consent patient was treated with HVLA, ME, FPR techniques in  thoracic, lumbar, and sacral  areas  Patient tolerated the procedure well with improvement in symptoms  Patient given exercises, stretches and lifestyle modifications  See medications in patient instructions if given  Patient will follow up in 4-8 weeks     The above documentation has been reviewed and is accurate and complete  Aarik Blank M Mansour Balboa, DO         Note: This dictation was prepared with Dragon dictation along with smaller phrase technology. Any transcriptional errors that result from this process are unintentional.

## 2023-10-27 ENCOUNTER — Other Ambulatory Visit (HOSPITAL_COMMUNITY): Payer: Self-pay

## 2023-10-29 ENCOUNTER — Other Ambulatory Visit: Payer: Self-pay

## 2023-10-30 ENCOUNTER — Encounter: Payer: Self-pay | Admitting: Family Medicine

## 2023-10-30 ENCOUNTER — Other Ambulatory Visit (HOSPITAL_COMMUNITY): Payer: Self-pay

## 2023-10-30 ENCOUNTER — Ambulatory Visit (INDEPENDENT_AMBULATORY_CARE_PROVIDER_SITE_OTHER): Admitting: Family Medicine

## 2023-10-30 VITALS — BP 128/82 | HR 86 | Ht 61.0 in | Wt 213.0 lb

## 2023-10-30 DIAGNOSIS — M545 Low back pain, unspecified: Secondary | ICD-10-CM

## 2023-10-30 DIAGNOSIS — M9902 Segmental and somatic dysfunction of thoracic region: Secondary | ICD-10-CM | POA: Diagnosis not present

## 2023-10-30 DIAGNOSIS — G8929 Other chronic pain: Secondary | ICD-10-CM

## 2023-10-30 DIAGNOSIS — M9903 Segmental and somatic dysfunction of lumbar region: Secondary | ICD-10-CM

## 2023-10-30 DIAGNOSIS — M9904 Segmental and somatic dysfunction of sacral region: Secondary | ICD-10-CM | POA: Diagnosis not present

## 2023-10-30 MED ORDER — PREDNISONE 20 MG PO TABS
40.0000 mg | ORAL_TABLET | Freq: Every day | ORAL | 0 refills | Status: DC
  Filled 2023-10-30: qty 14, 7d supply, fill #0

## 2023-10-30 NOTE — Assessment & Plan Note (Signed)
 Chronic problem but doing relatively well.  Watching weight closely.  Continuing to get stronger.  Does have the underlying psoriatic arthritis, will be traveling out of the country, prednisone  40 mg for 7 days prescribed.  Follow-up again 6 to 8 weeks.  After evaluation did respond well to osteopathic manipulation.

## 2023-10-30 NOTE — Patient Instructions (Addendum)
 Prednisone  40mg  for 7 days 1mg  of melatonin, 500mg  of ashwaganda before flight When you wake up b complex and 500mg  of Vit C See me in 6-8 weeks

## 2023-10-31 DIAGNOSIS — F4322 Adjustment disorder with anxiety: Secondary | ICD-10-CM | POA: Diagnosis not present

## 2023-11-01 ENCOUNTER — Other Ambulatory Visit (HOSPITAL_COMMUNITY): Payer: Self-pay

## 2023-11-01 ENCOUNTER — Encounter (INDEPENDENT_AMBULATORY_CARE_PROVIDER_SITE_OTHER): Payer: Self-pay | Admitting: Internal Medicine

## 2023-11-01 ENCOUNTER — Ambulatory Visit (INDEPENDENT_AMBULATORY_CARE_PROVIDER_SITE_OTHER): Admitting: Internal Medicine

## 2023-11-01 VITALS — BP 136/82 | HR 80 | Temp 98.2°F | Ht 61.0 in | Wt 208.0 lb

## 2023-11-01 DIAGNOSIS — Z7984 Long term (current) use of oral hypoglycemic drugs: Secondary | ICD-10-CM | POA: Diagnosis not present

## 2023-11-01 DIAGNOSIS — E1169 Type 2 diabetes mellitus with other specified complication: Secondary | ICD-10-CM

## 2023-11-01 DIAGNOSIS — E0865 Diabetes mellitus due to underlying condition with hyperglycemia: Secondary | ICD-10-CM

## 2023-11-01 DIAGNOSIS — E66813 Obesity, class 3: Secondary | ICD-10-CM | POA: Diagnosis not present

## 2023-11-01 DIAGNOSIS — R638 Other symptoms and signs concerning food and fluid intake: Secondary | ICD-10-CM

## 2023-11-01 DIAGNOSIS — Z6841 Body Mass Index (BMI) 40.0 and over, adult: Secondary | ICD-10-CM

## 2023-11-01 DIAGNOSIS — G4733 Obstructive sleep apnea (adult) (pediatric): Secondary | ICD-10-CM

## 2023-11-01 DIAGNOSIS — E1165 Type 2 diabetes mellitus with hyperglycemia: Secondary | ICD-10-CM | POA: Diagnosis not present

## 2023-11-01 MED ORDER — METFORMIN HCL ER 500 MG PO TB24
500.0000 mg | ORAL_TABLET | Freq: Every evening | ORAL | 0 refills | Status: DC
  Filled 2023-11-01: qty 90, 90d supply, fill #0

## 2023-11-01 NOTE — Progress Notes (Signed)
 Office: [phone]  /  Fax: [phone]  Weight Summary And Biometrics  Vitals Temp: 98.2 F (36.8 C) BP: 136/82 Pulse Rate: 80 SpO2: 98 %   Anthropometric Measurements Height: 5\' 1"  (1.549 m) Weight: 208 lb (94.3 kg) BMI (Calculated): 39.32 Weight at Last Visit: 213lb Weight Lost Since Last Visit: 5lb Weight Gained Since Last Visit: 0lb Starting Weight: 218lb Total Weight Loss (lbs): 8 lb (3.629 kg) Peak Weight: 280lb   Body Composition  Body Fat %: 45.7 % Fat Mass (lbs): 95.4 lbs Muscle Mass (lbs): 107.4 lbs Total Body Water (lbs): 76.4 lbs Visceral Fat Rating : 14    RMR: 1886  Today's Visit #: 6  Starting Date: 07/12/23   Subjective   Chief Complaint: Obesity  Interval History Discussed the use of AI scribe software for clinical note transcription with the patient, who gave verbal consent to proceed.  History of Present Illness   Tabitha [name] "Tabitha [name]" is a 58 year old female who presents for medical weight management.  She has lost five pounds since her last visit and adheres to a 1200 calorie meal plan 70-80% of the time, though she is not tracking her calories. She consumes more whole foods and maintains adequate hydration. Despite a busy work schedule, she has managed to lose weight.  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 This workload is both rewarding and demanding.  Her blood sugar levels have improved at night since increasing her metformin  dose, with no side effects reported. Her appetite has not changed significantly with the higher dose, and she often does not feel full. Her husband assists with food preparation due to her work schedule, which involves working late into the evening.  Her physical activity has increased to four to six days a week,  including two one-hour weightlifting sessions and two to four 30-minute cardio sessions weekly. She had a recent flare-up requiring steroids for her shoulder and thumb, but she reports feeling better now.  She is on a new biologic, but feels it is not effective. She has had her second maintenance dose and plans to follow up with her rheumatologist after her upcoming trip to [place].  She is taking Synjardy , Vyvanse , and occasionally Adderall. Vyvanse  has helped reduce obsessive thoughts about food, and she uses Adderall as needed during flares. She is concerned about bringing Vyvanse  to [place] due to medication regulations.  She often feels hungry, especially in the evenings, and acknowledges mindless eating habits. She tries to incorporate protein-rich snacks like yogurt with fruit or nuts to manage hunger.       Challenges affecting patient progress: medical comorbidities, moderate to high levels of stress, and menopause.    Pharmacotherapy for weight management: She is currently taking Metformin  with diabetes as primary indication with adequate clinical response  and without side effects. and SGLT2.  Her metformin  was increased last office visit.  No side effects reported  Assessment and Plan   Treatment Plan For Obesity:  Recommended Dietary Goals  Tabitha [name] L is currently in the action stage of change. As such, her goal is to continue weight management plan. She has agreed to: continue current plan  Behavioral Health and Counseling  We discussed the following behavioral modification strategies today: continue to work on maintaining a reduced calorie state, getting the recommended amount of protein,  incorporating whole foods, making healthy choices, staying well hydrated and practicing mindfulness when eating..  Additional education and resources provided today: None  Recommended Physical Activity Goals  Tabitha [name] L has been advised to work up to 150 minutes of moderate intensity  aerobic activity a week and strengthening exercises 2-3 times per week for cardiovascular health, weight loss maintenance and preservation of muscle mass.   She has agreed to :  Think about enjoyable ways to increase daily physical activity and overcoming barriers to exercise and Increase physical activity in their day and reduce sedentary time (increase NEAT).  Pharmacotherapy  We discussed various medication options to help [name] with her weight loss efforts and we both agreed to : adequate clinical response to anti-obesity medication, continue current regimen and she is now considering GLP-1 therapy and considering degree of adiposity and comorbidities she would benefit from treatment.  Associated Conditions Impacted by Obesity Treatment  Diabetes mellitus due to underlying condition with hyperglycemia, without long-term current use of insulin  (HCC) Assessment & Plan: Improved glycemic control with increased metformin  dose. No side effects reported. Considering GLP-1 receptor agonist (Mounjaro) post-trip for weight management and enhanced diabetes control. GLP-1's role in enhancing satiety and slowing digestion was discussed, with potential benefits for weight loss and diabetes management. Responsiveness to calorie reduction suggests potential benefit from GLP-1 therapy. Discussed possible discontinuation of Synjardy  if Mounjaro is initiated, given its dual role in diabetes and weight management. - Continue metformin  plus Synjardy  with a 90-day supply to be sent to [facility]. - Discuss starting Mounjaro at the next visit after the trip. - Consider discontinuing Synjardy  if Mounjaro is initiated.   Type 2 diabetes mellitus with other specified complication, without long-term current use of insulin  (HCC) -     metFORMIN  HCl ER; Take 1 tablet (500 mg total) by mouth every evening.  Dispense: 90 tablet; Refill: 0  OSA (obstructive sleep apnea) Assessment & Plan: On CPAP with  reported good compliance. Continue PAP therapy. Losing 15% or more of body weight may improve AHI.  She would also benefit from GLP-1 therapy    Abnormal food appetite Assessment & Plan: She has increased orexigenic signaling, impaired satiety and inhibitory control. This is secondary to an abnormal energy regulation system and pathological neurohormonal pathways characteristic of excess adiposity.  She may also have insulin  resistance and leptin resistance.  In addition to nutritional and behavioral strategies she may benefit from pharmacotherapy.  Consider GLP-1 therapy at the next office visit    Class 3 severe obesity with serious comorbidity and body mass index (BMI) of 40.0 to 44.9 in adult Assessment & Plan: Lost 5 pounds in the last four weeks despite a stressful work environment. Engages in regular physical activity, including weightlifting and cardio. Reports persistent hunger, indicating possible insufficient caloric intake. Discussed potential benefit of GLP-1 receptor agonist therapy for weight management by reducing hunger and increasing satiety. Open to starting GLP-1 therapy post-trip. Emphasized chronic nature of obesity and GLP-1's role in regulating hunger signals. - Discuss GLP-1 receptor agonist therapy (Mounjaro) at the next visit. - Encourage protein-rich snacks to manage hunger, such as yogurt with fruit or nuts with fruit.           Objective   Physical Exam:  Blood pressure 136/82, pulse 80, temperature 98.2 F (36.8 C), height 5\' 1"  (1.549 m), weight 208 lb (94.3 kg), SpO2 98%. Body mass index is 39.3 kg/m.  General: She is overweight, cooperative, alert, well developed, and in no  acute distress. PSYCH: Has normal mood, affect and thought process.   HEENT: EOMI, sclerae are anicteric. Lungs: Normal breathing effort, no conversational dyspnea. Extremities: No edema.  Neurologic: No gross sensory or motor deficits. No tremors or fasciculations noted.     Diagnostic Data Reviewed:  BMET    Component Value Date/Time   NA 139 07/12/2023 0000   K 4.5 07/12/2023 0000   CL 100 07/12/2023 0000   CO2 22 07/12/2023 0000   GLUCOSE 140 (H) 04/04/2022 0000   BUN 15 07/12/2023 0000   CREATININE 0.6 07/12/2023 0000   CREATININE 0.72 04/04/2022 0000   CALCIUM 9.6 07/12/2023 0000   GFRNONAA 98 08/21/2019 0834   GFRAA 114 08/21/2019 0834   Lab Results  Component Value Date   HGBA1C 6.7 07/12/2023   HGBA1C 7.2 09/06/2011   Lab Results  Component Value Date   INSULIN  19.3 07/25/2023   Lab Results  Component Value Date   TSH 1.23 09/15/2022   CBC    Component Value Date/Time   WBC 6.6 07/12/2023 0000   WBC 7.9 04/04/2022 0000   RBC 5.09 07/12/2023 0000   HGB 15.3 07/12/2023 0000   HCT 46 07/12/2023 0000   PLT 183 07/12/2023 0000   MCV 91.1 04/04/2022 0000   MCH 30.6 04/04/2022 0000   MCHC 33.5 04/04/2022 0000   RDW 11.8 04/04/2022 0000   Iron Studies    Component Value Date/Time   FERRITIN 71 09/15/2022 0000   Lipid Panel     Component Value Date/Time   CHOL 183 07/25/2023 0754   TRIG 119 07/25/2023 0754   HDL 54 07/25/2023 0754   CHOLHDL 2.4 04/04/2022 0000   VLDL 25.0 09/25/2019 0920   LDLCALC 108 (H) 07/25/2023 0754   LDLCALC 87 04/04/2022 0000   LDLDIRECT 149 (H) 12/06/2011 1702   Hepatic Function Panel     Component Value Date/Time   PROT 7.0 04/04/2022 0000   ALBUMIN 4.5 07/12/2023 0000   AST 23 07/12/2023 0000   ALT 33 07/12/2023 0000   ALKPHOS 99 07/12/2023 0000   BILITOT 0.5 04/04/2022 0000      Component Value Date/Time   TSH 1.23 09/15/2022 0000   TSH 1.02 12/11/2018 1102   Nutritional Lab Results  Component Value Date   VD25OH 57.2 07/25/2023   VD25OH 38.8 09/15/2022    Medications: Outpatient Encounter Medications as of 11/01/2023  Medication Sig Note   ACCU-CHEK GUIDE test strip  11/26/2020: use   Acetaminophen  (TYLENOL  PO) Take by mouth as needed. Not sure the dose     amphetamine -dextroamphetamine  (ADDERALL) 10 MG tablet Take 1 tablet by mouth daily as directed (Patient taking differently: Take 10 mg by mouth as needed. Only takes as needed)    bimekizumab -bkzx (BIMZELX ) 160 MG/ML pen Inject 320 mg subcutaneously every 8 (eight) weeks MAINTENANCE DOSE: (Patient taking differently: Inject into the skin every 30 (thirty) days.)    celecoxib  (CELEBREX ) 100 MG capsule Take 1 capsule (100 mg total) by mouth 2 (two) times daily.    clobetasol  (TEMOVATE ) 0.05 % external solution Apply topically 2 (two) times daily (Patient taking differently: Apply 1 Application topically as needed.)    clotrimazole -betamethasone  (LOTRISONE ) cream Apply to affected area(s) on skin 2 (two) times daily. (Patient taking differently: Apply 1 application  topically as needed.)    Continuous Glucose Sensor (FREESTYLE LIBRE 3 PLUS SENSOR) MISC Change sensor every 15 days.    desonide  (DESOWEN ) 0.05 % cream Apply 1 application topically 2 (two) times daily. (Patient  taking differently: Apply 1 Application topically as needed.)    Empagliflozin -metFORMIN  HCl ER (SYNJARDY  XR) 03-999 MG TB24 Take 1 tablet by mouth daily with breakfast.    glucose blood (FREESTYLE LITE) test strip Use to check blood sugar 3 times a day    lisdexamfetamine (VYVANSE ) 60 MG capsule Take 1 capsule by mouth daily    Naltrexone HCl, Pain, 4.5 MG CAPS Take 1 tablet by mouth every morning.    Omega-3 Fatty Acids (FISH OIL) 1200 MG CAPS Take 1,200-2,400 mg by mouth 2 (two) times daily. Takes 2400 mg qam and 1200 mg qpm    predniSONE  (DELTASONE ) 20 MG tablet Take 2 tablets (40 mg total) by mouth daily with breakfast.    traZODone  (DESYREL ) 50 MG tablet Take 1-2 tablets (50-100 mg total) by mouth at bedtime.    TURMERIC PO Take 1 tablet by mouth as needed.    [DISCONTINUED] metFORMIN  (GLUCOPHAGE -XR) 500 MG 24 hr tablet Take 1 tablet (500 mg total) by mouth every evening.    metFORMIN  (GLUCOPHAGE -XR) 500 MG 24 hr tablet Take 1  tablet (500 mg total) by mouth every evening.    [DISCONTINUED] bimekizumab -bkzx (BIMZELX ) 160 MG/ML pen Inject 320 mg subcutaneously every 28 (twenty-eight) days LOADING DOSE: Inject 320mg  (two 160mg  injections) SubQ at week 0, 4, 8, 12, and 16    No facility-administered encounter medications on file as of 11/01/2023.     Follow-Up   Return for Schedule future 3-4 week follow-up.Aaron Aas She was informed of the importance of frequent follow up visits to maximize her success with intensive lifestyle modifications for her multiple health conditions.  Attestation Statement   Reviewed by clinician on day of visit: allergies, medications, problem list, medical history, surgical history, family history, social history, and previous encounter notes.     Ladd Picker, MD

## 2023-11-02 NOTE — Assessment & Plan Note (Signed)
 On CPAP with reported good compliance. Continue PAP therapy. Losing 15% or more of body weight may improve AHI.  She would also benefit from GLP-1 therapy

## 2023-11-02 NOTE — Assessment & Plan Note (Signed)
 She has increased orexigenic signaling, impaired satiety and inhibitory control. This is secondary to an abnormal energy regulation system and pathological neurohormonal pathways characteristic of excess adiposity.  She may also have insulin  resistance and leptin resistance.  In addition to nutritional and behavioral strategies she may benefit from pharmacotherapy.  Consider GLP-1 therapy at the next office visit

## 2023-11-02 NOTE — Assessment & Plan Note (Signed)
 Lost 5 pounds in the last four weeks despite a stressful work environment. Engages in regular physical activity, including weightlifting and cardio. Reports persistent hunger, indicating possible insufficient caloric intake. Discussed potential benefit of GLP-1 receptor agonist therapy for weight management by reducing hunger and increasing satiety. Open to starting GLP-1 therapy post-trip. Emphasized chronic nature of obesity and GLP-1's role in regulating hunger signals. - Discuss GLP-1 receptor agonist therapy (Mounjaro) at the next visit. - Encourage protein-rich snacks to manage hunger, such as yogurt with fruit or nuts with fruit.

## 2023-11-02 NOTE — Assessment & Plan Note (Signed)
 Improved glycemic control with increased metformin  dose. No side effects reported. Considering GLP-1 receptor agonist (Mounjaro) post-trip for weight management and enhanced diabetes control. GLP-1's role in enhancing satiety and slowing digestion was discussed, with potential benefits for weight loss and diabetes management. Responsiveness to calorie reduction suggests potential benefit from GLP-1 therapy. Discussed possible discontinuation of Synjardy  if Mounjaro is initiated, given its dual role in diabetes and weight management. - Continue metformin  plus Synjardy  with a 90-day supply to be sent to [facility]. - Discuss starting Mounjaro at the next visit after the trip. - Consider discontinuing Synjardy  if Mounjaro is initiated.

## 2023-11-08 ENCOUNTER — Ambulatory Visit (INDEPENDENT_AMBULATORY_CARE_PROVIDER_SITE_OTHER): Admitting: Internal Medicine

## 2023-11-12 ENCOUNTER — Other Ambulatory Visit (HOSPITAL_COMMUNITY): Payer: Self-pay

## 2023-11-12 DIAGNOSIS — F4322 Adjustment disorder with anxiety: Secondary | ICD-10-CM | POA: Diagnosis not present

## 2023-11-12 MED ORDER — LISDEXAMFETAMINE DIMESYLATE 60 MG PO CAPS
60.0000 mg | ORAL_CAPSULE | Freq: Every day | ORAL | 0 refills | Status: DC
  Filled 2023-11-12: qty 30, 30d supply, fill #0

## 2023-11-15 ENCOUNTER — Ambulatory Visit (INDEPENDENT_AMBULATORY_CARE_PROVIDER_SITE_OTHER): Admitting: Family Medicine

## 2023-11-15 ENCOUNTER — Other Ambulatory Visit: Payer: Self-pay

## 2023-11-15 ENCOUNTER — Encounter: Payer: Self-pay | Admitting: Family Medicine

## 2023-11-15 VITALS — BP 104/68 | HR 77 | Ht 61.0 in

## 2023-11-15 DIAGNOSIS — M19012 Primary osteoarthritis, left shoulder: Secondary | ICD-10-CM

## 2023-11-15 DIAGNOSIS — M545 Low back pain, unspecified: Secondary | ICD-10-CM | POA: Diagnosis not present

## 2023-11-15 DIAGNOSIS — M9903 Segmental and somatic dysfunction of lumbar region: Secondary | ICD-10-CM

## 2023-11-15 DIAGNOSIS — M9901 Segmental and somatic dysfunction of cervical region: Secondary | ICD-10-CM

## 2023-11-15 DIAGNOSIS — M9908 Segmental and somatic dysfunction of rib cage: Secondary | ICD-10-CM

## 2023-11-15 DIAGNOSIS — M25511 Pain in right shoulder: Secondary | ICD-10-CM

## 2023-11-15 DIAGNOSIS — M9902 Segmental and somatic dysfunction of thoracic region: Secondary | ICD-10-CM

## 2023-11-15 DIAGNOSIS — M19011 Primary osteoarthritis, right shoulder: Secondary | ICD-10-CM

## 2023-11-15 DIAGNOSIS — M9904 Segmental and somatic dysfunction of sacral region: Secondary | ICD-10-CM

## 2023-11-15 DIAGNOSIS — G8929 Other chronic pain: Secondary | ICD-10-CM

## 2023-11-15 NOTE — Progress Notes (Signed)
 [name] [address] [place] [address] Phone: [phone] Subjective:   [name], am serving as a scribe for Dr. Ronnell Coins.  I'm seeing this patient by the request  of:  Adela Holter, DO  CC: Back and neck pain, right shoulder pain  [id number]  Tabitha [name] is a 58 y.o. female coming in with complaint of back and neck pain. Patient states shoulder pain. Popped last night and not hurting as bad. Injection did help up until yesterday.  Medications patient has been prescribed:   Taking:         Reviewed prior external information including notes and imaging from previsou exam, outside providers and external EMR if available.   As well as notes that were available from care everywhere and other healthcare systems.  Past medical history, social, surgical and family history all reviewed in electronic medical record.  No pertanent information unless stated regarding to the chief complaint.   Past Medical History:  Diagnosis Date   A-fib (HCC)    Alcohol abuse    Anemia    Arthritis    Asthma    Back pain    De Quervain's disease (tenosynovitis)    Dermatophytosis, scalp    Diabetes mellitus    Drug use    Edema of both lower extremities    Fatty liver    Female infertility    Gallbladder problem    Hypercholesteremia    Joint pain    Obesity    Osteoarthritis    Palpitations    PCOS (polycystic ovarian syndrome)    Polycystic ovarian disease    Psoriatic arthritis (HCC)    SOB (shortness of breath)    Vitamin D  deficiency    Wears glasses     Allergies  Allergen Reactions   Chocolate Anaphylaxis   Corn-Containing Products Nausea Only    Fatigue, body aches, upset stomach,   Food     chocolate   Gluten Meal    Latex Rash   Methotrexate  Rash     Review of Systems:  No headache, visual changes, nausea, vomiting, diarrhea, constipation, dizziness, abdominal pain, skin rash, fevers, chills, night  sweats, weight loss, swollen lymph nodes, body aches, joint swelling, chest pain, shortness of breath, mood changes. POSITIVE muscle aches  Objective  Blood pressure 104/68, pulse 77, height 5\' 1"  (1.549 m), SpO2 98%.   General: No apparent distress alert and oriented x3 mood and affect normal, dressed appropriately.  HEENT: Pupils equal, extraocular movements intact  Respiratory: Patient's speak in full sentences and does not appear short of breath  Cardiovascular: No lower extremity edema, non tender, no erythema  Right shoulder significant discomfort noted over the acromioclavicular joint.  Some mild grinding sensation noted.  Positive crossover noted.  Rotator cuff strength seems to be intact.   Procedure: Real-time Ultrasound Guided Injection of right acromioclavicular joint Device: GE Logiq Q7 Ultrasound guided injection is preferred based studies that show increased duration, increased effect, greater accuracy, decreased procedural pain, increased response rate, and decreased cost with ultrasound guided versus blind injection.  Verbal informed consent obtained.  Time-out conducted.  Noted no overlying erythema, induration, or other signs of local infection.  Skin prepped in a sterile fashion.  Local anesthesia: Topical Ethyl chloride.  With sterile technique and under real time ultrasound guidance: With a 25-gauge half inch needle injected with 0.5 cc of 0.5% Marcaine  and 0.5 cc of Kenalog  40 mg/mL Completed without difficulty  Pain  immediately resolved suggesting accurate placement of the medication.  Advised to call if fevers/chills, erythema, induration, drainage, or persistent bleeding.  Images saved Impression: Technically successful ultrasound guided injection. Osteopathic findings  C2 flexed rotated and side bent right C6 flexed rotated and side bent right T3 extended rotated and side bent right inhaled rib L2 flexed rotated and side bent right Sacrum right on  right       Assessment and Plan:  Arthritis of both acromioclavicular joints Chronic problem with recurrent exacerbation.  Continuing to have difficulty I do think we need to consider the possibility of advanced imaging.  Increase activity slowly.    Nonallopathic problems  Decision today to treat with OMT was based on Physical Exam  After verbal consent patient was treated with HVLA, ME, FPR techniques in cervical, rib, thoracic, lumbar, and sacral  areas  Patient tolerated the procedure well with improvement in symptoms  Patient given exercises, stretches and lifestyle modifications  See medications in patient instructions if given  Patient will follow up in 4-8 weeks             Note: This dictation was prepared with Dragon dictation along with smaller phrase technology. Any transcriptional errors that result from this process are unintentional.

## 2023-11-15 NOTE — Patient Instructions (Addendum)
 Have fun on your trip.

## 2023-11-15 NOTE — Assessment & Plan Note (Signed)
 Chronic problem that likely did have some exacerbation noted.  Discussed which activities to do and which ones to avoid.  Increase activity slowly.  Follow-up again in 6 to 8 weeks

## 2023-11-15 NOTE — Assessment & Plan Note (Signed)
 Chronic problem with recurrent exacerbation.  Continuing to have difficulty I do think we need to consider the possibility of advanced imaging.  Increase activity slowly.

## 2023-11-27 DIAGNOSIS — F4322 Adjustment disorder with anxiety: Secondary | ICD-10-CM | POA: Diagnosis not present

## 2023-12-04 ENCOUNTER — Other Ambulatory Visit: Payer: Self-pay

## 2023-12-04 NOTE — Progress Notes (Unsigned)
 [name] [address] [place] [address] Phone: [phone] Subjective:   [name], am serving as a scribe for Dr. Arthea Claudene.  I'm seeing this patient by the request  of:  Alvia Bring, DO  CC: Back and neck pain follow-up  [name]  Tabitha [name] is a 58 y.o. female coming in with complaint of back and neck pain. OMT on 11/15/2023. Also seen for shoulder pain. Patient states doing well. Shoulder gets a little tight, but doing well overall. Having leg cramps.  Patient does not know exactly why, was very active doing a lot of walking initially.          Reviewed prior external information including notes and imaging from previsou exam, outside providers and external EMR if available.   As well as notes that were available from care everywhere and other healthcare systems.  Past medical history, social, surgical and family history all reviewed in electronic medical record.  No pertanent information unless stated regarding to the chief complaint.   Past Medical History:  Diagnosis Date   A-fib (HCC)    Alcohol abuse    Anemia    Arthritis    Asthma    Back pain    De Quervain's disease (tenosynovitis)    Dermatophytosis, scalp    Diabetes mellitus    Drug use    Edema of both lower extremities    Fatty liver    Female infertility    Gallbladder problem    Hypercholesteremia    Joint pain    Obesity    Osteoarthritis    Palpitations    PCOS (polycystic ovarian syndrome)    Polycystic ovarian disease    Psoriatic arthritis (HCC)    SOB (shortness of breath)    Vitamin D  deficiency    Wears glasses     Allergies  Allergen Reactions   Chocolate Anaphylaxis   Corn-Containing Products Nausea Only    Fatigue, body aches, upset stomach,   Food     chocolate   Gluten Meal    Latex Rash   Methotrexate  Rash     Review of Systems:  No headache, visual changes, nausea, vomiting, diarrhea, constipation,  dizziness, abdominal pain, skin rash, fevers, chills, night sweats, weight loss, swollen lymph nodes, body aches, joint swelling, chest pain, shortness of breath, mood changes. POSITIVE muscle aches  Objective  Blood pressure 108/66, pulse 94, height 5' 1 (1.549 m), SpO2 95%.   General: No apparent distress alert and oriented x3 mood and affect normal, dressed appropriately.  HEENT: Pupils equal, extraocular movements intact  Respiratory: Patient's speak in full sentences and does not appear short of breath  Cardiovascular: No lower extremity edema, non tender, no erythema  Gait MSK:  Back still has significant tightness noted.  Seems to be in all planes at the moment.  Tightness with Deri right greater than left  Osteopathic findings  C2 flexed rotated and side bent right C5 flexed rotated and side bent left T5 extended rotated and side bent right inhaled rib T9 extended rotated and side bent left L5 flexed rotated and side bent right Sacrum right on right       Assessment and Plan:  Low back pain Tightness noted everywhere.  No significant swelling of the joints so do not think that this is an exacerbation of the other difficulties or problems patient has had in the past.  Discussed icing regimen and home exercises, discussed which activities to do  and which ones to avoid.  Increase activity slowly otherwise.  Discussed icing regimen.  Follow-up again in 6 to 8 weeks.    Nonallopathic problems  Decision today to treat with OMT was based on Physical Exam  After verbal consent patient was treated with HVLA, ME, FPR techniques in cervical, rib, thoracic, lumbar, and sacral  areas avoided HVLA on the cervical spine  Patient tolerated the procedure well with improvement in symptoms  Patient given exercises, stretches and lifestyle modifications  See medications in patient instructions if given  Patient will follow up in 4-8 weeks      The above documentation has been  reviewed and is accurate and complete Ladean Steinmeyer M Danniell Rotundo, DO        Note: This dictation was prepared with Dragon dictation along with smaller phrase technology. Any transcriptional errors that result from this process are unintentional.

## 2023-12-05 ENCOUNTER — Ambulatory Visit (INDEPENDENT_AMBULATORY_CARE_PROVIDER_SITE_OTHER): Admitting: Internal Medicine

## 2023-12-05 ENCOUNTER — Encounter (INDEPENDENT_AMBULATORY_CARE_PROVIDER_SITE_OTHER): Payer: Self-pay | Admitting: Internal Medicine

## 2023-12-05 ENCOUNTER — Other Ambulatory Visit (HOSPITAL_COMMUNITY): Payer: Self-pay

## 2023-12-05 VITALS — BP 115/71 | HR 62 | Temp 98.3°F | Ht 61.0 in | Wt 209.0 lb

## 2023-12-05 DIAGNOSIS — I4891 Unspecified atrial fibrillation: Secondary | ICD-10-CM | POA: Diagnosis not present

## 2023-12-05 DIAGNOSIS — E669 Obesity, unspecified: Secondary | ICD-10-CM | POA: Diagnosis not present

## 2023-12-05 DIAGNOSIS — Z6841 Body Mass Index (BMI) 40.0 and over, adult: Secondary | ICD-10-CM | POA: Diagnosis not present

## 2023-12-05 DIAGNOSIS — L4050 Arthropathic psoriasis, unspecified: Secondary | ICD-10-CM

## 2023-12-05 DIAGNOSIS — I480 Paroxysmal atrial fibrillation: Secondary | ICD-10-CM

## 2023-12-05 DIAGNOSIS — Z7985 Long-term (current) use of injectable non-insulin antidiabetic drugs: Secondary | ICD-10-CM

## 2023-12-05 DIAGNOSIS — E785 Hyperlipidemia, unspecified: Secondary | ICD-10-CM | POA: Diagnosis not present

## 2023-12-05 DIAGNOSIS — G4733 Obstructive sleep apnea (adult) (pediatric): Secondary | ICD-10-CM | POA: Diagnosis not present

## 2023-12-05 DIAGNOSIS — E1169 Type 2 diabetes mellitus with other specified complication: Secondary | ICD-10-CM | POA: Diagnosis not present

## 2023-12-05 DIAGNOSIS — Z7984 Long term (current) use of oral hypoglycemic drugs: Secondary | ICD-10-CM

## 2023-12-05 MED ORDER — TIRZEPATIDE 2.5 MG/0.5ML ~~LOC~~ SOAJ
2.5000 mg | SUBCUTANEOUS | 0 refills | Status: DC
  Filled 2023-12-05: qty 2, 28d supply, fill #0

## 2023-12-05 NOTE — Progress Notes (Signed)
 Office: [phone]  /  Fax: [phone]  Weight Summary and Body Composition Analysis (BIA)  Vitals Temp: 98.3 F (36.8 C) BP: 115/71 Pulse Rate: 62 SpO2: 97 %   Anthropometric Measurements Height: 5' 1 (1.549 m) Weight: 209 lb (94.8 kg) BMI (Calculated): 39.51 Weight at Last Visit: 208 lb Weight Lost Since Last Visit: 0 lb Weight Gained Since Last Visit: 1 lb Starting Weight: 218 lb Total Weight Loss (lbs): 9 lb (4.082 kg) Peak Weight: 280   Body Composition  Body Fat %: 44.3 % Fat Mass (lbs): 92.8 lbs Muscle Mass (lbs): 110.8 lbs Total Body Water (lbs): 75.2 lbs Visceral Fat Rating : 14    RMR: 1886  Today's Visit #: 7  Starting Date: 07/12/23   Subjective   Chief Complaint: Obesity  Interval History Discussed the use of AI scribe software for clinical note transcription with the patient, who gave verbal consent to proceed.  History of Present Illness   Tabitha [name] is a 58 year old female with atrial fibrillation, sleep apnea, hyperlipidemia, and type 2 diabetes who presents for medical weight management.  She has experienced a recent weight gain of one pound since her last visit, following a cruise. She adheres to a 1200 calorie diet about 50% of the time but does not track her calories. She is eating more whole foods, getting the recommended amount of protein, and occasionally skipping meals.  She exercises six days a week for 30 to 45 minutes, engaging in strength training, cardio, and yoga. However, she reports inadequate sleep and increased stress levels. She recently returned from a cruise where her husband contracted COVID-19, but she did not test positive, experiencing only sinus-like symptoms.  Her current medications include CPAP for sleep apnea and Synjardy  for diabetes management. She also takes metformin , which was recently increased. She has a history of psoriatic arthritis and has been on multiple biologics, currently on her  sixth, and still experiences psoriasis outbreaks.       Challenges affecting patient progress: strong hunger signals and/or impaired satiety / inhibitory control, medical comorbidities, and presence of obesogenic drugs.    Pharmacotherapy for weight management: She is currently taking Synjardy  for type 2 diabetes.   Assessment and Plan   Treatment Plan For Obesity:  Recommended Dietary Goals  Tabitha [name] is currently in the action stage of change. As such, her goal is to continue weight management plan. She has agreed to: continue current plan  Behavioral Health and Counseling  We discussed the following behavioral modification strategies today: continue to work on maintaining a reduced calorie state, getting the recommended amount of protein, incorporating whole foods, making healthy choices, staying well hydrated and practicing mindfulness when eating..  Additional education and resources provided today: None  Recommended Physical Activity Goals  Tabitha [name] has been advised to work up to 150 minutes of moderate intensity aerobic activity a week and strengthening exercises 2-3 times per week for cardiovascular health, weight loss maintenance and preservation of muscle mass.   She has agreed to :  Think about enjoyable ways to increase daily physical activity and overcoming barriers to exercise and Increase physical activity in their day and reduce sedentary time (increase NEAT).  Medical Interventions and Pharmacotherapy  We discussed various medication options to help Tabitha [name] with her weight loss efforts and we both agreed to : Start anti-obesity medication.  In addition to reduced calorie nutrition plan (RCNP), behavioral strategies and physical activity, Tabitha [name] would benefit from pharmacotherapy  to assist with hunger signals, satiety and cravings. This will reduce obesity-related health risks by inducing weight loss, and help reduce food consumption and adherence to [facility]) . It may also  improve QOL by improving self-confidence and reduce the  setbacks associated with metabolic adaptations.  She has high risk obesity related conditions these include atrial fibrillation, sleep apnea, hyperlipidemia, type 2 diabetes, biomechanical joint pain, and strong orexigenic signaling.  These conditions increase her cardiovascular risk and risk of cancer.  After discussion of treatment options, mechanisms of action, benefits, side effects, contraindications and shared decision making she is agreeable to starting Mounjaro 2.5 mg once a week. Patient also made aware that medication is indicated for long-term management of obesity and the risk of weight regain following discontinuation of treatment and hence the importance of adhering to medical weight loss plan.  We demonstrated use of device and patient using teach back method was able to demonstrate proper technique.  Associated Conditions Impacted by Obesity Treatment  Type 2 diabetes mellitus with other specified complication, without long-term current use of insulin  (HCC) -     Tirzepatide; Inject 2.5 mg into the skin once a week.  Dispense: 2 mL; Refill: 0  Paroxysmal atrial fibrillation (HCC)  OSA (obstructive sleep apnea)  Hyperlipidemia associated with type 2 diabetes mellitus (HCC)  Class 3 severe obesity with serious comorbidity and body mass index (BMI) of 40.0 to 44.9 in adult     Assessment and Plan    Obesity She presents for medical weight management with comorbidities including atrial fibrillation, sleep apnea on CPAP, hyperlipidemia, type 2 diabetes, and abnormal food appetite, all influenced by her weight. She gained one pound since her last visit, adheres to a 1200 calorie diet 50% of the time, experiences inadequate sleep and increased stress, and exercises six days a week for 30-45 minutes. Discussed GLP-1 receptor agonist therapy, specifically Mounjaro, which can lead to 15-22% weight loss. Explained its mechanism of  action, potential side effects, and the necessity of lifestyle modifications.  Reviewed mechanisms of action and common side effects as well as management - Initiate Mounjaro at 2.5 mg once weekly, titrate dose every 3-4 weeks based on response and side effects. - Discontinue Synjardy  once Mounjaro is at an effective dose. - Continue Metformin  as it works well with Mounjaro. - Provide handout on common side effects of GLP-1s and their management. - Use [facility] for initial prescription to assist with prior authorization. - Encourage intake of 90 ounces of water, 25 grams of fiber, and 90 grams of protein daily.  Type 2 Diabetes Mellitus Currently managed with Synjardy  and Metformin . GLP-1 therapy is expected to improve diabetes control and potentially reduce the need for Synjardy . Mounjaro is anticipated to be covered due to her diabetes diagnosis. - Discontinue Synjardy  once Mounjaro is at an effective dose. - Continue Metformin .  Atrial Fibrillation Chronic condition that may improve with weight management and GLP-1 therapy. Weight loss can reduce the burden of atrial fibrillation.  Obstructive Sleep Apnea Managed with CPAP. Weight management and GLP-1 therapy may improve this condition by reducing weight-related airway obstruction.  Hyperlipidemia Chronic condition that may improve with weight management and GLP-1 therapy. Weight loss can lead to improved lipid profiles.  Psoriatic Arthritis Currently on her sixth biologic with occasional psoriasis outbreaks. Discussed potential anti-inflammatory benefits of GLP-1 therapy, which may help reduce inflammation and improve joint symptoms.  General Health Maintenance Emphasized the importance of lifestyle modifications including diet and exercise as foundational to weight  management and overall health improvement. - Encourage healthy eating, increased intake of vegetables, fruits, and lean proteins. - Continue regular exercise  regimen.  Follow-up Necessary to monitor response to Mounjaro and adjust dosage as needed. Regular monitoring will help assess effectiveness and manage any side effects. - Schedule follow-up appointment in 4 weeks to assess response to Mounjaro and adjust dosage if necessary.          Objective   Physical Exam:  Blood pressure 115/71, pulse 62, temperature 98.3 F (36.8 C), height 5' 1 (1.549 m), weight 209 lb (94.8 kg), SpO2 97%. Body mass index is 39.49 kg/m.  General: She is overweight, cooperative, alert, well developed, and in no acute distress. PSYCH: Has normal mood, affect and thought process.   HEENT: EOMI, sclerae are anicteric. Lungs: Normal breathing effort, no conversational dyspnea. Extremities: No edema.  Neurologic: No gross sensory or motor deficits. No tremors or fasciculations noted.    Diagnostic Data Reviewed:  BMET    Component Value Date/Time   NA 139 07/12/2023 0000   K 4.5 07/12/2023 0000   CL 100 07/12/2023 0000   CO2 22 07/12/2023 0000   GLUCOSE 140 (H) 04/04/2022 0000   BUN 15 07/12/2023 0000   CREATININE 0.6 07/12/2023 0000   CREATININE 0.72 04/04/2022 0000   CALCIUM 9.6 07/12/2023 0000   GFRNONAA 98 08/21/2019 0834   GFRAA 114 08/21/2019 0834   Lab Results  Component Value Date   HGBA1C 6.7 07/12/2023   HGBA1C 7.2 09/06/2011   Lab Results  Component Value Date   INSULIN  19.3 07/25/2023   Lab Results  Component Value Date   TSH 1.23 09/15/2022   CBC    Component Value Date/Time   WBC 6.6 07/12/2023 0000   WBC 7.9 04/04/2022 0000   RBC 5.09 07/12/2023 0000   HGB 15.3 07/12/2023 0000   HCT 46 07/12/2023 0000   PLT 183 07/12/2023 0000   MCV 91.1 04/04/2022 0000   MCH 30.6 04/04/2022 0000   MCHC 33.5 04/04/2022 0000   RDW 11.8 04/04/2022 0000   Iron Studies    Component Value Date/Time   FERRITIN 71 09/15/2022 0000   Lipid Panel     Component Value Date/Time   CHOL 183 07/25/2023 0754   TRIG 119 07/25/2023 0754    HDL 54 07/25/2023 0754   CHOLHDL 2.4 04/04/2022 0000   VLDL 25.0 09/25/2019 0920   LDLCALC 108 (H) 07/25/2023 0754   LDLCALC 87 04/04/2022 0000   LDLDIRECT 149 (H) 12/06/2011 1702   Hepatic Function Panel     Component Value Date/Time   PROT 7.0 04/04/2022 0000   ALBUMIN 4.5 07/12/2023 0000   AST 23 07/12/2023 0000   ALT 33 07/12/2023 0000   ALKPHOS 99 07/12/2023 0000   BILITOT 0.5 04/04/2022 0000      Component Value Date/Time   TSH 1.23 09/15/2022 0000   TSH 1.02 12/11/2018 1102   Nutritional Lab Results  Component Value Date   VD25OH 57.2 07/25/2023   VD25OH 38.8 09/15/2022    Medications: Outpatient Encounter Medications as of 12/05/2023  Medication Sig Note   ACCU-CHEK GUIDE test strip  11/26/2020: use   Acetaminophen  (TYLENOL  PO) Take by mouth as needed. Not sure the dose    amphetamine -dextroamphetamine  (ADDERALL) 10 MG tablet Take 1 tablet by mouth daily as directed (Patient taking differently: Take 10 mg by mouth as needed. Only takes as needed)    bimekizumab -bkzx (BIMZELX ) 160 MG/ML pen Inject 320 mg subcutaneously every 8 (eight)  weeks MAINTENANCE DOSE: (Patient taking differently: Inject into the skin every 30 (thirty) days.)    celecoxib  (CELEBREX ) 100 MG capsule Take 1 capsule (100 mg total) by mouth 2 (two) times daily.    clobetasol  (TEMOVATE ) 0.05 % external solution Apply topically 2 (two) times daily (Patient taking differently: Apply 1 Application topically as needed.)    clotrimazole -betamethasone  (LOTRISONE ) cream Apply to affected area(s) on skin 2 (two) times daily. (Patient taking differently: Apply 1 application  topically as needed.)    Continuous Glucose Sensor (FREESTYLE LIBRE 3 PLUS SENSOR) MISC Change sensor every 15 days.    desonide  (DESOWEN ) 0.05 % cream Apply 1 application topically 2 (two) times daily. (Patient taking differently: Apply 1 Application topically as needed.)    Empagliflozin -metFORMIN  HCl ER (SYNJARDY  XR) 03-999 MG TB24  Take 1 tablet by mouth daily with breakfast.    glucose blood (FREESTYLE LITE) test strip Use to check blood sugar 3 times a day    lisdexamfetamine (VYVANSE ) 60 MG capsule Take 1 capsule by mouth daily    metFORMIN  (GLUCOPHAGE -XR) 500 MG 24 hr tablet Take 1 tablet (500 mg total) by mouth every evening.    Naltrexone HCl, Pain, 4.5 MG CAPS Take 1 tablet by mouth every morning.    Omega-3 Fatty Acids (FISH OIL) 1200 MG CAPS Take 1,200-2,400 mg by mouth 2 (two) times daily. Takes 2400 mg qam and 1200 mg qpm    predniSONE  (DELTASONE ) 20 MG tablet Take 2 tablets (40 mg total) by mouth daily with breakfast.    tirzepatide (MOUNJARO) 2.5 MG/0.5ML Pen Inject 2.5 mg into the skin once a week.    traZODone  (DESYREL ) 50 MG tablet Take 1-2 tablets (50-100 mg total) by mouth at bedtime.    TURMERIC PO Take 1 tablet by mouth as needed.    No facility-administered encounter medications on file as of 12/05/2023.     Follow-Up   Return in about 4 weeks (around 01/02/2024) for For Weight Mangement with Dr. Francyne.SABRA She was informed of the importance of frequent follow up visits to maximize her success with intensive lifestyle modifications for her multiple health conditions.  Attestation Statement   Reviewed by clinician on day of visit: allergies, medications, problem list, medical history, surgical history, family history, social history, and previous encounter notes.     Lucas Francyne, MD

## 2023-12-07 ENCOUNTER — Other Ambulatory Visit (HOSPITAL_COMMUNITY): Payer: Self-pay

## 2023-12-07 DIAGNOSIS — F4322 Adjustment disorder with anxiety: Secondary | ICD-10-CM | POA: Diagnosis not present

## 2023-12-10 ENCOUNTER — Other Ambulatory Visit (HOSPITAL_COMMUNITY): Payer: Self-pay

## 2023-12-10 DIAGNOSIS — F902 Attention-deficit hyperactivity disorder, combined type: Secondary | ICD-10-CM | POA: Diagnosis not present

## 2023-12-10 DIAGNOSIS — Z79899 Other long term (current) drug therapy: Secondary | ICD-10-CM | POA: Diagnosis not present

## 2023-12-10 MED ORDER — LISDEXAMFETAMINE DIMESYLATE 60 MG PO CAPS
60.0000 mg | ORAL_CAPSULE | Freq: Every day | ORAL | 0 refills | Status: DC
  Filled 2023-12-10: qty 30, 30d supply, fill #0

## 2023-12-11 ENCOUNTER — Ambulatory Visit: Admitting: Family Medicine

## 2023-12-11 VITALS — BP 108/66 | HR 94 | Ht 61.0 in

## 2023-12-11 DIAGNOSIS — M9903 Segmental and somatic dysfunction of lumbar region: Secondary | ICD-10-CM | POA: Diagnosis not present

## 2023-12-11 DIAGNOSIS — M9904 Segmental and somatic dysfunction of sacral region: Secondary | ICD-10-CM | POA: Diagnosis not present

## 2023-12-11 DIAGNOSIS — M9902 Segmental and somatic dysfunction of thoracic region: Secondary | ICD-10-CM | POA: Diagnosis not present

## 2023-12-11 DIAGNOSIS — M545 Low back pain, unspecified: Secondary | ICD-10-CM

## 2023-12-11 DIAGNOSIS — G8929 Other chronic pain: Secondary | ICD-10-CM | POA: Diagnosis not present

## 2023-12-11 DIAGNOSIS — M9908 Segmental and somatic dysfunction of rib cage: Secondary | ICD-10-CM | POA: Diagnosis not present

## 2023-12-11 DIAGNOSIS — M9901 Segmental and somatic dysfunction of cervical region: Secondary | ICD-10-CM

## 2023-12-11 NOTE — Patient Instructions (Signed)
Good to see you! See you again in 6-8 weeks 

## 2023-12-12 ENCOUNTER — Other Ambulatory Visit (HOSPITAL_COMMUNITY): Payer: Self-pay

## 2023-12-12 ENCOUNTER — Encounter: Payer: Self-pay | Admitting: Family Medicine

## 2023-12-12 ENCOUNTER — Other Ambulatory Visit: Payer: Self-pay

## 2023-12-12 MED ORDER — AZITHROMYCIN 500 MG PO TABS
500.0000 mg | ORAL_TABLET | Freq: Every day | ORAL | 0 refills | Status: DC
  Filled 2023-12-12: qty 3, 3d supply, fill #0

## 2023-12-12 NOTE — Assessment & Plan Note (Signed)
 Tightness noted everywhere.  No significant swelling of the joints so do not think that this is an exacerbation of the other difficulties or problems patient has had in the past.  Discussed icing regimen and home exercises, discussed which activities to do and which ones to avoid.  Increase activity slowly otherwise.  Discussed icing regimen.  Follow-up again in 6 to 8 weeks.

## 2023-12-13 ENCOUNTER — Other Ambulatory Visit: Payer: Self-pay

## 2023-12-13 DIAGNOSIS — F4322 Adjustment disorder with anxiety: Secondary | ICD-10-CM | POA: Diagnosis not present

## 2023-12-17 ENCOUNTER — Other Ambulatory Visit: Payer: Self-pay

## 2023-12-17 DIAGNOSIS — H40013 Open angle with borderline findings, low risk, bilateral: Secondary | ICD-10-CM | POA: Diagnosis not present

## 2023-12-18 ENCOUNTER — Other Ambulatory Visit (HOSPITAL_COMMUNITY): Payer: Self-pay

## 2023-12-18 ENCOUNTER — Encounter: Payer: Self-pay | Admitting: Family Medicine

## 2023-12-18 ENCOUNTER — Telehealth (HOSPITAL_COMMUNITY): Payer: Self-pay

## 2023-12-18 NOTE — Progress Notes (Signed)
 PA approved.

## 2023-12-18 NOTE — Telephone Encounter (Signed)
 Pharmacy Patient Advocate Encounter  Received notification from [facility] that Prior Authorization for Bimzelx  has been APPROVED from 12/18/23 to 12/17/24   PA #/Case ID/Reference #: [id number]

## 2023-12-18 NOTE — Telephone Encounter (Signed)
 Pharmacy Patient Advocate Encounter   Received notification from Patient Pharmacy that prior authorization for Bimzelx  is required/requested.   Insurance verification completed.   The patient is insured through [facility] .   Per test claim: PA required; PA submitted to above mentioned insurance via CoverMyMeds Key/confirmation #/EOC [id number] Status is pending

## 2023-12-18 NOTE — Progress Notes (Signed)
 PA pending

## 2023-12-19 ENCOUNTER — Encounter: Payer: Self-pay | Admitting: Family Medicine

## 2023-12-19 ENCOUNTER — Ambulatory Visit (INDEPENDENT_AMBULATORY_CARE_PROVIDER_SITE_OTHER): Admitting: Family Medicine

## 2023-12-19 ENCOUNTER — Other Ambulatory Visit (HOSPITAL_COMMUNITY): Payer: Self-pay

## 2023-12-19 ENCOUNTER — Encounter (INDEPENDENT_AMBULATORY_CARE_PROVIDER_SITE_OTHER): Payer: Self-pay

## 2023-12-19 ENCOUNTER — Other Ambulatory Visit: Payer: Self-pay

## 2023-12-19 VITALS — BP 122/74 | HR 74 | Temp 98.3°F | Resp 20 | Ht 61.0 in | Wt 216.0 lb

## 2023-12-19 DIAGNOSIS — J0100 Acute maxillary sinusitis, unspecified: Secondary | ICD-10-CM | POA: Insufficient documentation

## 2023-12-19 DIAGNOSIS — E1169 Type 2 diabetes mellitus with other specified complication: Secondary | ICD-10-CM

## 2023-12-19 DIAGNOSIS — Z7984 Long term (current) use of oral hypoglycemic drugs: Secondary | ICD-10-CM

## 2023-12-19 DIAGNOSIS — F4322 Adjustment disorder with anxiety: Secondary | ICD-10-CM | POA: Diagnosis not present

## 2023-12-19 LAB — POCT GLYCOSYLATED HEMOGLOBIN (HGB A1C): Hemoglobin A1C: 6 % — AB (ref 4.0–5.6)

## 2023-12-19 MED ORDER — DOXYCYCLINE HYCLATE 100 MG PO TABS
100.0000 mg | ORAL_TABLET | Freq: Two times a day (BID) | ORAL | 0 refills | Status: DC
  Filled 2023-12-19: qty 20, 10d supply, fill #0

## 2023-12-19 NOTE — Assessment & Plan Note (Signed)
 Blood sugars are well-controlled at this time.  She will continue Mounjaro .  Healthy weight and wellness is managing this at this time.

## 2023-12-19 NOTE — Progress Notes (Signed)
 Specialty Pharmacy Refill Coordination Note  Tabitha [name] is a 58 y.o. female contacted today regarding refills of specialty medication(s) Bimekizumab -bkzx (Bimzelx )    Patient requested (Patient-Rptd) Pickup at [facility] date: (Patient-Rptd) 12/24/23   Medication will be filled on 12/21/23.

## 2023-12-19 NOTE — Assessment & Plan Note (Signed)
 Treated with course of doxycycline .  Encouraged to stay well-hydrated.  She will let me know if not improving with this.

## 2023-12-19 NOTE — Progress Notes (Signed)
 [name] - 58 y.o. female MRN [id number]  Date of birth: [birth year]  Subjective Chief Complaint  Patient presents with   Medical Management of Chronic Issues    Type 2 diabetes mellitus with other specified complication, without long-term current use of insulin  (HCC)   Sinus Problem    HPI Tabitha [name] is a 58 y.o. female here today for follow up visit.   She was recently seen by Dr. Darlyn Sharps for orthopedic issue and mentioned having fever and sinus pain.  Z-pack added and she had brief improvement but had elevated temp again recently.  She has continued sinus pain and pressure.   She continues on mounjaro  for treatment of diabetes.  She is seeing healthy weight and wellness.  She is currently at 2.5mg .  She feels that this is working pretty well for her.  A1c is decreased to 6.0%.  ROS:  A comprehensive ROS was completed and negative except as noted per HPI  Allergies  Allergen Reactions   Chocolate Anaphylaxis   Corn-Containing Products Nausea Only    Fatigue, body aches, upset stomach,   Food     chocolate   Gluten Meal    Latex Rash   Methotrexate  Rash    Past Medical History:  Diagnosis Date   A-fib (HCC)    Alcohol abuse    Anemia    Arthritis    Asthma    Back pain    De Quervain's disease (tenosynovitis)    Dermatophytosis, scalp    Diabetes mellitus    Drug use    Edema of both lower extremities    Fatty liver    Female infertility    Gallbladder problem    Hypercholesteremia    Joint pain    Obesity    Osteoarthritis    Palpitations    PCOS (polycystic ovarian syndrome)    Polycystic ovarian disease    Psoriatic arthritis (HCC)    SOB (shortness of breath)    Vitamin D  deficiency    Wears glasses     Past Surgical History:  Procedure Laterality Date   BREAST BIOPSY Right    CARPAL TUNNEL RELEASE     COLONOSCOPY     DE QUERVAIN'S RELEASE  2012   left   KNEE ARTHROPLASTY     KNEE ARTHROSCOPY Right 05/06/2013   Procedure: RIGHT KNEE  ARTHROSCOPY ;  Surgeon: Maude KANDICE Herald, MD;  Location: [facility];  Service: Orthopedics;  Laterality: Right;  partial lateral minisectomy and chondroplasty   WISDOM TOOTH EXTRACTION     WRIST SURGERY  2001   carpel tunnel -rt    Social History   Socioeconomic History   Marital status: Married    Spouse name: Carlin   Number of children: Not on file   Years of education: Not on file   Highest education level: Master's degree (e.g., MA, MS, MEng, MEd, MSW, MBA)  Occupational History   Occupation: Academic librarian: [employer]    Comment: at wom[facility]   Occupation: Nurse, children's, 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   Vaping Use   Vaping status: Never Used  Substance and Sexual Activity   Alcohol use: Yes    Comment: rarely   Drug use: No   Sexual activity: Yes    Partners: Male    Comment: married  Other Topics Concern   Not on file  Social History Narrative   Not on file   Social Drivers of Health   Financial Resource Strain: Low Risk  (07/01/2023)   Overall Financial Resource Strain (CARDIA)    Difficulty of Paying Living Expenses: Not hard at all  Food Insecurity: No Food Insecurity (07/01/2023)   Hunger Vital Sign    Worried About Running Out of Food in the Last Year: Never true    Ran Out of Food in the Last Year: Never true  Transportation Needs: No Transportation Needs (07/01/2023)   PRAPARE - Administrator, Civil Service (Medical): No    Lack of Transportation (Non-Medical): No  Physical Activity: Sufficiently Active (07/01/2023)   Exercise Vital Sign    Days of Exercise per Week: 3 days     Minutes of Exercise per Session: 60 min  Stress: Stress Concern Present (07/01/2023)   [employer] of Occupational Health - Occupational Stress Questionnaire    Feeling of Stress : To some extent  Social Connections: Moderately Integrated (07/01/2023)   Social Connection and Isolation Panel    Frequency of Communication with Friends and Family: More than three times a week    Frequency of Social Gatherings with Friends and Family: Once a week    Attends Religious Services: 1 to 4 times per year    Active Member of [employer] or Organizations: No    Attends Engineer, structural: Not on file    Marital Status: Married    Family History  Problem Relation Age of Onset   Cancer Mother 29       breast ca died at age 34   Breast cancer Mother 64   Depression Mother    Anxiety disorder Mother    Obesity Mother    Diabetes Father    Hypertension Father    Stroke Father    Kidney disease Father    Heart disease Father    High Cholesterol Father    Obesity Father    Obesity Brother    Hypertension Brother    Heart disease Maternal Grandmother    Alzheimer's disease Maternal Grandmother    Diabetes Paternal Grandmother    Heart disease Paternal Grandfather    Hypertension Other    Diabetes type II Other    Obesity Other     Health Maintenance  Topic Date Due   Hepatitis C Screening  Never done   Hepatitis B Vaccines (1 of 3 - 19+ 3-dose series) Never done   COVID-19 Vaccine (8 - 2024-25 season) 02/11/2023   Cervical Cancer Screening (HPV/Pap Cotest)  08/13/2023   INFLUENZA VACCINE  01/11/2024   Diabetic kidney evaluation - Urine ACR  02/02/2024   FOOT EXAM  02/02/2024   OPHTHALMOLOGY EXAM  06/17/2024   HEMOGLOBIN A1C  06/20/2024   Diabetic kidney evaluation - eGFR measurement  07/11/2024   MAMMOGRAM  07/12/2024   DTaP/Tdap/Td (3 - Td or Tdap) 01/06/2032   Colonoscopy  05/29/2032   Pneumococcal Vaccine 61-46 Years old  Completed   HIV Screening  Completed   Zoster  Vaccines- Shingrix   Completed   HPV VACCINES  Aged Out   Meningococcal B Vaccine  Aged Out     -----------------------------------------------------------------------------------------------------------------------------------------------------------------------------------------------------------------  Physical Exam BP 122/74   Pulse 74   Temp 98.3 F (36.8 C) (Oral)   Resp 20   Ht 5' 1 (1.549 m)   Wt 216 lb (98 kg)   SpO2 99%   BMI 40.81 kg/m   Physical Exam Constitutional:      Appearance: Normal appearance.  HENT:     Head: Normocephalic and atraumatic.     Right Ear: Tympanic membrane normal.     Left Ear: Tympanic membrane normal.  Eyes:     General: No scleral icterus. Cardiovascular:     Rate and Rhythm: Normal rate and regular rhythm.  Pulmonary:     Effort: Pulmonary effort is normal.     Breath sounds: Normal breath sounds.  Musculoskeletal:     Cervical back: Neck supple.  Neurological:     General: No focal deficit present.     Mental Status: She is alert.  Psychiatric:        Mood and Affect: Mood normal.        Behavior: Behavior normal.     ------------------------------------------------------------------------------------------------------------------------------------------------------------------------------------------------------------------- Assessment and Plan  Diabetes mellitus (HCC) Blood sugars are well-controlled at this time.  She will continue Mounjaro .  Healthy weight and wellness is managing this at this time.  Acute maxillary sinusitis Treated with course of doxycycline .  Encouraged to stay well-hydrated.  She will let me know if not improving with this.   Meds ordered this encounter  Medications   doxycycline  (VIBRA -TABS) 100 MG tablet    Sig: Take 1 tablet (100 mg total) by mouth 2 (two) times daily.    Dispense:  20 tablet    Refill:  0    Return in about 2 months (around 02/19/2024) for Annual Exam.

## 2023-12-19 NOTE — Patient Instructions (Signed)

## 2023-12-20 ENCOUNTER — Other Ambulatory Visit: Payer: Self-pay

## 2023-12-22 ENCOUNTER — Other Ambulatory Visit (HOSPITAL_COMMUNITY): Payer: Self-pay

## 2023-12-26 ENCOUNTER — Other Ambulatory Visit (HOSPITAL_COMMUNITY): Payer: Self-pay

## 2023-12-26 ENCOUNTER — Ambulatory Visit (INDEPENDENT_AMBULATORY_CARE_PROVIDER_SITE_OTHER): Admitting: Internal Medicine

## 2023-12-26 ENCOUNTER — Encounter (INDEPENDENT_AMBULATORY_CARE_PROVIDER_SITE_OTHER): Payer: Self-pay | Admitting: Internal Medicine

## 2023-12-26 VITALS — BP 126/85 | HR 59 | Temp 97.9°F | Ht 61.0 in | Wt 209.0 lb

## 2023-12-26 DIAGNOSIS — Z7984 Long term (current) use of oral hypoglycemic drugs: Secondary | ICD-10-CM

## 2023-12-26 DIAGNOSIS — E66813 Obesity, class 3: Secondary | ICD-10-CM

## 2023-12-26 DIAGNOSIS — Z7985 Long-term (current) use of injectable non-insulin antidiabetic drugs: Secondary | ICD-10-CM

## 2023-12-26 DIAGNOSIS — G4733 Obstructive sleep apnea (adult) (pediatric): Secondary | ICD-10-CM

## 2023-12-26 DIAGNOSIS — E1169 Type 2 diabetes mellitus with other specified complication: Secondary | ICD-10-CM | POA: Diagnosis not present

## 2023-12-26 DIAGNOSIS — Z6841 Body Mass Index (BMI) 40.0 and over, adult: Secondary | ICD-10-CM | POA: Diagnosis not present

## 2023-12-26 MED ORDER — METFORMIN HCL ER 500 MG PO TB24
500.0000 mg | ORAL_TABLET | Freq: Every evening | ORAL | 0 refills | Status: DC
  Filled 2023-12-26 – 2024-01-30 (×2): qty 90, 90d supply, fill #0

## 2023-12-26 MED ORDER — TIRZEPATIDE 2.5 MG/0.5ML ~~LOC~~ SOAJ
2.5000 mg | SUBCUTANEOUS | 0 refills | Status: DC
  Filled 2023-12-26: qty 2, 28d supply, fill #0

## 2023-12-26 NOTE — Progress Notes (Unsigned)
 Office: [phone]  /  Fax: [phone]  Weight Summary and Body Composition Analysis (BIA)  Vitals Temp: 97.9 F (36.6 C) BP: 126/85 Pulse Rate: (!) 59 SpO2: 99 %   Anthropometric Measurements Height: 5' 1 (1.549 m) Weight: 209 lb (94.8 kg) BMI (Calculated): 39.51 Weight at Last Visit: 209lb Weight Lost Since Last Visit: 0 Weight Gained Since Last Visit: 0 Starting Weight: 218lb Total Weight Loss (lbs): 9 lb (4.082 kg) Peak Weight: 280lb   Body Composition  Body Fat %: 44.2 % Fat Mass (lbs): 92.4 lbs Muscle Mass (lbs): 110.8 lbs Total Body Water (lbs): 73.8 lbs Visceral Fat Rating : 14    RMR: 1886  Today's Visit #: 8  Starting Date: 07/12/23   Subjective   Chief Complaint: Obesity  Interval History Discussed the use of AI scribe software for clinical note transcription with the patient, who gave verbal consent to proceed.  History of Present Illness   Tabitha [name] is a 59 year old female with type 2 diabetes who presents for follow-up on medical weight management.  She is on Mounjaro  treatment.She started Mounjaro  at a dose of 2.5 mg once a week, in addition to her ongoing metformin  500 mg daily. Previously, she was on Synjardy . Since initiating Mounjaro , she has experienced nausea and constipation, particularly after the second injection, which occurred on a Sunday and persisted into Monday. She has completed two injections and is due for her third.Her blood sugars have been well-controlled, with 96% of readings in range, although slightly elevated at night over the past two days. Her recent A1c was 6.0.She has been on antibiotics, initially azithromycin  and currently doxycycline , for a sinus infection that began after returning from [place]. She uses a neti pot every other day to manage her symptoms.The effects of Mounjaro  on appetite and 'food noise' are noticeable, though they taper off by day five of the weekly cycle. She is mindful of her eating  patterns, especially with concurrent Vyvanse  use, and has adjusted her diet to manage nausea and constipation, using ginger essential oil and increasing fiber intake.       Challenges affecting patient progress: none.    Pharmacotherapy for weight management: She is currently taking Monjauro with diabetes as the primary indication with adequate clinical response but is experiencing some side effects of current dose in the form of nausea.  Assessment and Plan   Treatment Plan For Obesity:  Recommended Dietary Goals  Tabitha [name] is currently in the action stage of change. As such, her goal is to continue weight management plan. She has agreed to: continue current plan  Behavioral Health and Counseling  We discussed the following behavioral modification strategies today: continue to work on maintaining a reduced calorie state, getting the recommended amount of protein, incorporating whole foods, making healthy choices, staying well hydrated and practicing mindfulness when eating..  Additional education and resources provided today: None  Recommended Physical Activity Goals  Tabitha [name] has been advised to work up to 150 minutes of moderate intensity aerobic activity a week and strengthening exercises 2-3 times per week for cardiovascular health, weight loss maintenance and preservation of muscle mass.   She has agreed to :  continue to gradually increase the amount and intensity of exercise routine  Medical Interventions and Pharmacotherapy  We discussed various medication options to help [name] with her weight loss efforts and we both agreed to : Adequate clinical response to anti-obesity medication, continue current regimen and do not recommend further  increases in GLP-1 due to gastrointestinal side effects  Associated Conditions Impacted by Obesity Treatment  Assessment & Plan Type 2 diabetes mellitus with other specified complication, without long-term current use of insulin  (HCC) Most  recent A1c is 6.0 she is currently on Mounjaro  2.5 mg once a week and metformin .  She is no longer on Synjardy .  She will continue on combination for synergy.  She will stay at current dose of Mounjaro  due to GI side effects. OSA (obstructive sleep apnea) On CPAP with reported good compliance. Continue PAP therapy. Losing 15% or more of body weight may improve AHI.  She would also benefit from GLP-1 therapy  Class 3 severe obesity with serious comorbidity and body mass index (BMI) of 40.0 to 44.9 in adult Has lost 9 pounds.  She is currently on Mounjaro  for diabetes management and weight management but is experiencing nausea at the 2.5 mg dose.  We again reviewed nutritional approaches to minimize side effects.  She will stay at this dose for another 4 weeks.  Continue current nutrition and behavioral strategies.Provided guidance on healthy eating, managing hunger, and food cravings, especially in the evening. Discussed managing food noise and appetite for long-term diabetes management.- Encourage healthy eating habits, focusing on whole foods and home-cooked meals.- Advise on healthy snacks with protein and healthy fats to manage evening hunger.- Discuss strategies for managing food cravings and appetite.         Objective   Physical Exam:  Blood pressure 126/85, pulse (!) 59, temperature 97.9 F (36.6 C), height 5' 1 (1.549 m), weight 209 lb (94.8 kg), SpO2 99%. Body mass index is 39.49 kg/m.  General: She is overweight, cooperative, alert, well developed, and in no acute distress. PSYCH: Has normal mood, affect and thought process.   HEENT: EOMI, sclerae are anicteric. Lungs: Normal breathing effort, no conversational dyspnea. Extremities: No edema.  Neurologic: No gross sensory or motor deficits. No tremors or fasciculations noted.    Diagnostic Data Reviewed:  BMET    Component Value Date/Time   NA 139 07/12/2023 0000   K 4.5 07/12/2023 0000   CL 100 07/12/2023 0000   CO2 22  07/12/2023 0000   GLUCOSE 140 (H) 04/04/2022 0000   BUN 15 07/12/2023 0000   CREATININE 0.6 07/12/2023 0000   CREATININE 0.72 04/04/2022 0000   CALCIUM 9.6 07/12/2023 0000   GFRNONAA 98 08/21/2019 0834   GFRAA 114 08/21/2019 0834   Lab Results  Component Value Date   HGBA1C 6.0 (A) 12/19/2023   HGBA1C 7.2 09/06/2011   Lab Results  Component Value Date   INSULIN  19.3 07/25/2023   Lab Results  Component Value Date   TSH 1.23 09/15/2022   CBC    Component Value Date/Time   WBC 6.6 07/12/2023 0000   WBC 7.9 04/04/2022 0000   RBC 5.09 07/12/2023 0000   HGB 15.3 07/12/2023 0000   HCT 46 07/12/2023 0000   PLT 183 07/12/2023 0000   MCV 91.1 04/04/2022 0000   MCH 30.6 04/04/2022 0000   MCHC 33.5 04/04/2022 0000   RDW 11.8 04/04/2022 0000   Iron Studies    Component Value Date/Time   FERRITIN 71 09/15/2022 0000   Lipid Panel     Component Value Date/Time   CHOL 183 07/25/2023 0754   TRIG 119 07/25/2023 0754   HDL 54 07/25/2023 0754   CHOLHDL 2.4 04/04/2022 0000   VLDL 25.0 09/25/2019 0920   LDLCALC 108 (H) 07/25/2023 0754   LDLCALC 87 04/04/2022 0000  LDLDIRECT 149 (H) 12/06/2011 1702   Hepatic Function Panel     Component Value Date/Time   PROT 7.0 04/04/2022 0000   ALBUMIN 4.5 07/12/2023 0000   AST 23 07/12/2023 0000   ALT 33 07/12/2023 0000   ALKPHOS 99 07/12/2023 0000   BILITOT 0.5 04/04/2022 0000      Component Value Date/Time   TSH 1.23 09/15/2022 0000   TSH 1.02 12/11/2018 1102   Nutritional Lab Results  Component Value Date   VD25OH 57.2 07/25/2023   VD25OH 38.8 09/15/2022    Medications: Outpatient Encounter Medications as of 12/26/2023  Medication Sig Note   ACCU-CHEK GUIDE test strip  11/26/2020: use   Acetaminophen  (TYLENOL  PO) Take by mouth as needed. Not sure the dose    amphetamine -dextroamphetamine  (ADDERALL) 10 MG tablet Take 1 tablet by mouth daily as directed (Patient taking differently: Take 10 mg by mouth as needed. Only takes  as needed)    bimekizumab -bkzx (BIMZELX ) 160 MG/ML pen Inject 320 mg subcutaneously every 8 (eight) weeks MAINTENANCE DOSE: (Patient taking differently: Inject into the skin every 30 (thirty) days.)    celecoxib  (CELEBREX ) 100 MG capsule Take 1 capsule (100 mg total) by mouth 2 (two) times daily.    clobetasol  (TEMOVATE ) 0.05 % external solution Apply topically 2 (two) times daily (Patient taking differently: Apply 1 Application topically as needed.)    clotrimazole -betamethasone  (LOTRISONE ) cream Apply to affected area(s) on skin 2 (two) times daily. (Patient taking differently: Apply 1 application  topically as needed.)    Continuous Glucose Sensor (FREESTYLE LIBRE 3 PLUS SENSOR) MISC Change sensor every 15 days.    desonide  (DESOWEN ) 0.05 % cream Apply 1 application topically 2 (two) times daily. (Patient taking differently: Apply 1 Application topically as needed.)    doxycycline  (VIBRA -TABS) 100 MG tablet Take 1 tablet (100 mg total) by mouth 2 (two) times daily.    lisdexamfetamine (VYVANSE ) 60 MG capsule Take 1 capsule by mouth daily    Naltrexone HCl, Pain, 4.5 MG CAPS Take 1 tablet by mouth every morning.    Omega-3 Fatty Acids (FISH OIL) 1200 MG CAPS Take 1,200-2,400 mg by mouth 2 (two) times daily. Takes 2400 mg qam and 1200 mg qpm    traZODone  (DESYREL ) 50 MG tablet Take 1-2 tablets (50-100 mg total) by mouth at bedtime.    [DISCONTINUED] metFORMIN  (GLUCOPHAGE -XR) 500 MG 24 hr tablet Take 1 tablet (500 mg total) by mouth every evening.    [DISCONTINUED] tirzepatide  (MOUNJARO ) 2.5 MG/0.5ML Pen Inject 2.5 mg into the skin once a week.    glucose blood (FREESTYLE LITE) test strip Use to check blood sugar 3 times a day    metFORMIN  (GLUCOPHAGE -XR) 500 MG 24 hr tablet Take 1 tablet (500 mg total) by mouth every evening.    tirzepatide  (MOUNJARO ) 2.5 MG/0.5ML Pen Inject 2.5 mg into the skin once a week.    TURMERIC PO Take 1 tablet by mouth as needed.    [DISCONTINUED] predniSONE  (DELTASONE )  20 MG tablet Take 2 tablets (40 mg total) by mouth daily with breakfast.    No facility-administered encounter medications on file as of 12/26/2023.     Follow-Up   Return in about 4 weeks (around 01/23/2024) for For Weight Mangement with Dr. Francyne.SABRA She was informed of the importance of frequent follow up visits to maximize her success with intensive lifestyle modifications for her multiple health conditions.  Attestation Statement   Reviewed by clinician on day of visit: allergies, medications, problem list, medical history, surgical  history, family history, social history, and previous encounter notes.     Lucas Parker, MD

## 2023-12-27 ENCOUNTER — Ambulatory Visit (INDEPENDENT_AMBULATORY_CARE_PROVIDER_SITE_OTHER): Payer: Commercial Managed Care - PPO | Admitting: Dermatology

## 2023-12-27 ENCOUNTER — Other Ambulatory Visit: Payer: Self-pay

## 2023-12-27 ENCOUNTER — Other Ambulatory Visit (HOSPITAL_COMMUNITY): Payer: Self-pay

## 2023-12-27 ENCOUNTER — Encounter: Payer: Self-pay | Admitting: Dermatology

## 2023-12-27 VITALS — BP 121/81 | HR 83

## 2023-12-27 DIAGNOSIS — L739 Follicular disorder, unspecified: Secondary | ICD-10-CM | POA: Diagnosis not present

## 2023-12-27 DIAGNOSIS — L304 Erythema intertrigo: Secondary | ICD-10-CM

## 2023-12-27 DIAGNOSIS — L409 Psoriasis, unspecified: Secondary | ICD-10-CM | POA: Diagnosis not present

## 2023-12-27 MED ORDER — ZORYVE 0.3 % EX FOAM: 1.0000 | Freq: Every day | CUTANEOUS | 9 refills | Status: DC

## 2023-12-27 MED ORDER — MUPIROCIN 2 % EX OINT
1.0000 | TOPICAL_OINTMENT | Freq: Two times a day (BID) | CUTANEOUS | 2 refills | Status: DC
  Filled 2023-12-27: qty 22, 11d supply, fill #0

## 2023-12-27 MED ORDER — NYSTATIN-TRIAMCINOLONE 100000-0.1 UNIT/GM-% EX OINT
1.0000 | TOPICAL_OINTMENT | Freq: Two times a day (BID) | CUTANEOUS | 0 refills | Status: DC
  Filled 2023-12-27: qty 60, 30d supply, fill #0

## 2023-12-27 NOTE — Assessment & Plan Note (Signed)
 Most recent A1c is 6.0 she is currently on Mounjaro  2.5 mg once a week and metformin .  She is no longer on Synjardy .  She will continue on combination for synergy.  She will stay at current dose of Mounjaro  due to GI side effects.

## 2023-12-27 NOTE — Assessment & Plan Note (Signed)
 Has lost 9 pounds.  She is currently on Mounjaro  for diabetes management and weight management but is experiencing nausea at the 2.5 mg dose.  We again reviewed nutritional approaches to minimize side effects.  She will stay at this dose for another 4 weeks.  Continue current nutrition and behavioral strategies.Provided guidance on healthy eating, managing hunger, and food cravings, especially in the evening. Discussed managing food noise and appetite for long-term diabetes management.- Encourage healthy eating habits, focusing on whole foods and home-cooked meals.- Advise on healthy snacks with protein and healthy fats to manage evening hunger.- Discuss strategies for managing food cravings and appetite.

## 2023-12-27 NOTE — Assessment & Plan Note (Signed)
 On CPAP with reported good compliance. Continue PAP therapy. Losing 15% or more of body weight may improve AHI.  She would also benefit from GLP-1 therapy

## 2023-12-27 NOTE — Patient Instructions (Addendum)
 Date: Thu Dec 27 2023  Hello Tabitha [name],  Thank you for visiting today. Here is a summary of the key instructions:  - Medications:   - Continue Bimzelx  as prescribed by your rheumatologist   - Use Zoryve  foam once a day for 8 weeks or until skin is clear   - After clearing, use 2-3 times a week for maintenance   - Apply nystatin -triamcinolone  ointment twice a day for up to 10 days   - Stop if condition clears sooner   - Apply topical mupirocin  twice a day to buttocks until healed  - Over-the-counter Products:   - Use ZeaSorb antifungal powder for maintenance after clearing   - Available at [facility]  - Treatment Instructions:   - Zoryve  foam will be sent to [facility]   - They will call you and mail it to you  - Follow-up:   - Return for follow-up appointment in 3 months  Please reach out if you have any questions or concerns.  Warm regards,  Dr. Delon Lenis [facility]at[facility]     Important Information   Due to recent changes in healthcare laws, you may see results of your pathology and/or laboratory studies on MyChart before the doctors have had a chance to review them. We understand that in some cases there may be results that are confusing or concerning to you. Please understand that not all results are received at the same time and often the doctors may need to interpret multiple results in order to provide you with the best plan of care or course of treatment. Therefore, we ask that you please give us  2 business days to thoroughly review all your results before contacting the office for clarification. Should we see a critical lab result, you will be contacted sooner.     If You Need Anything After Your Visit   If you have any questions or concerns for your doctor, please call our main line at [phone]. If no one answers, please leave a voicemail as directed and we will return your call as soon as possible. Messages left after 4 pm will be answered the following  business day.    You may also send us  a message via MyChart. We typically respond to MyChart messages within 1-2 business days.  For prescription refills, please ask your pharmacy to contact our office. Our fax number is [phone].  If you have an urgent issue when the clinic is closed that cannot wait until the next business day, you can page your doctor at the number below.     Please note that while we do our best to be available for urgent issues outside of office hours, we are not available 24/7.    If you have an urgent issue and are unable to reach us , you may choose to seek medical care at your doctor's office, retail clinic, urgent care center, or emergency room.   If you have a medical emergency, please immediately call 911 or go to the emergency department. In the event of inclement weather, please call our main line at [phone] for an update on the status of any delays or closures.  Dermatology Medication Tips: Please keep the boxes that topical medications come in in order to help keep track of the instructions about where and how to use these. Pharmacies typically print the medication instructions only on the boxes and not directly on the medication tubes.   If your medication is too expensive, please contact our office at  [phone] or send us  a message through MyChart.    We are unable to tell what your co-pay for medications will be in advance as this is different depending on your insurance coverage. However, we may be able to find a substitute medication at lower cost or fill out paperwork to get insurance to cover a needed medication.    If a prior authorization is required to get your medication covered by your insurance company, please allow us  1-2 business days to complete this process.   Drug prices often vary depending on where the prescription is filled and some pharmacies may offer cheaper prices.   The website www.goodrx.com contains coupons for  medications through different pharmacies. The prices here do not account for what the cost may be with help from insurance (it may be cheaper with your insurance), but the website can give you the price if you did not use any insurance.  - You can print the associated coupon and take it with your prescription to the pharmacy.  - You may also stop by our office during regular business hours and pick up a GoodRx coupon card.  - If you need your prescription sent electronically to a different pharmacy, notify our office through [facility] or by phone at [phone]

## 2023-12-27 NOTE — Progress Notes (Signed)
 New Patient Visit   Subjective  Tabitha [name] is a 58 y.o. female who presents for the following: Psoriasis  Patient states she has psoriasis located at the B/L Ears that she would like to have examined. Patient reports the areas have been there for several years. She reports the areas are bothersome. Patient rates the areas are itchy. Patient rates irritation 4 out of 10. Patient reports she has previously been treated for these areas by Dr. Tobie (Rheumatologist). She has previously tried and failed Remacaide, Embrel, Cosentyx , Taltz . She is currently on Bimzelyx. She has been on the Bimzelyx in November.   Patient also has irritation in b/L Axilla. She reports the area have been present for about 5 weeks. The irritation originally started as red, itchy rash with with papules. She applied otc Hydrocortisone that relieve the itch but now she has discoloration. She states the areas will itch every now and again but not like when the rash originally appeared.    She also has a sore a scab on her left buttock that will get better but it will never heal. She states she has been previously prescribed Doxy 100  mg BID for 9 days.   The following portions of the chart were reviewed this encounter and updated as appropriate: medications, allergies, medical history  Review of Systems:  No other skin or systemic complaints except as noted in HPI or Assessment and Plan.  Objective  Well appearing patient in no apparent distress; mood and affect are within normal limits.  A full examination was performed including scalp, head, eyes, ears, nose, lips, neck, chest, axillae, abdomen, back, buttocks, bilateral upper extremities, bilateral lower extremities, hands, feet, fingers, toes, fingernails, and toenails. All findings within normal limits unless otherwise noted below.   Relevant exam findings are noted in the Assessment and Plan.    Assessment & Plan   1. Psoriasis - Assessment: Patient has a  long-standing history of psoriasis, previously refractory to multiple biologic therapies including Remicade , Enbrel, Cosentyx , and Taltz . Currently on Bimzelx  as prescribed by her rheumatologist, with recent improvement noted after 8-10 weeks post-loading dose. Primary areas of concern are the inner ear, with erythematous plaques and significant scale, a few spots on the scalp, and a small plaque on the medial canthus of the left eye. Patient rates severity as 4/10.  BSA:2%, IGA:4  - Plan:    Continue Bimzelx  as prescribed by rheumatologist    Prescribe Zoryve  foam for ear and face involvement     - Apply once daily for 8 weeks or until skin clears     - Transition to 2-3 times weekly for maintenance and prevention    Follow up in 3 months  2. Intertrigo - Assessment: Patient presents with bilateral axillary irritation, characterized by pink atrophic plaques with satellite papules and pustules, consistent with intertrigo. This condition appears distinct from the patient's psoriasis. - Plan:    Prescribe nystatin -triamcinolone  ointment     - Apply twice daily for up to 10 days or until cleared    Recommend ZeaSorb antifungal powder for maintenance and prevention     - Available [facility]-the-counter at [facility]  3. Folliculitis of Buttock  - Assessment: Patient reports a persistent sore on her buttock, initially thought to be an insect bite. The lesion has been exacerbated by nocturnal scratching. Patient is currently on doxycycline  for a sinus infection, which has provided some improvement, but the area remains unhealed. - Plan:    Prescribe topical mupirocin      -  Apply twice daily until healed    Consider biopsy or advanced therapy if lesion persists at 76-month follow-up    Follow up in 3 months  INTERTRIGO   Related Medications nystatin -triamcinolone  ointment (MYCOLOG) Apply 1 Application topically 2 (two) times daily. Apply for 5 days then STOP PSORIASIS   Related  Medications Roflumilast  (ZORYVE ) 0.3 % FOAM Apply 1 Application topically daily. FOLLICULITIS   Related Medications mupirocin  ointment (BACTROBAN ) 2 % Apply 1 Application topically 2 (two) times daily.  Return in about 3 months (around 03/28/2024) for Psoriasis & Intertrigo F/U.  I, Jetta Ager, am acting as Neurosurgeon for [employer], DO.  Documentation: I have reviewed the above documentation for accuracy and completeness, and I agree with the above.  Delon Lenis, DO

## 2023-12-29 ENCOUNTER — Other Ambulatory Visit (HOSPITAL_COMMUNITY): Payer: Self-pay

## 2024-01-02 ENCOUNTER — Ambulatory Visit: Payer: Commercial Managed Care - PPO | Admitting: Family Medicine

## 2024-01-02 DIAGNOSIS — F4322 Adjustment disorder with anxiety: Secondary | ICD-10-CM | POA: Diagnosis not present

## 2024-01-03 ENCOUNTER — Encounter (INDEPENDENT_AMBULATORY_CARE_PROVIDER_SITE_OTHER): Payer: Self-pay | Admitting: Internal Medicine

## 2024-01-03 NOTE — Telephone Encounter (Signed)
**Note De-identified  [place] Obfuscation** Please advise 

## 2024-01-07 ENCOUNTER — Other Ambulatory Visit: Payer: Self-pay

## 2024-01-07 ENCOUNTER — Other Ambulatory Visit (HOSPITAL_COMMUNITY): Payer: Self-pay

## 2024-01-07 DIAGNOSIS — L409 Psoriasis, unspecified: Secondary | ICD-10-CM

## 2024-01-07 MED ORDER — CALCIPOTRIENE 0.005 % EX CREA
TOPICAL_CREAM | Freq: Two times a day (BID) | CUTANEOUS | 9 refills | Status: DC
  Filled 2024-01-07: qty 120, 60d supply, fill #0

## 2024-01-08 ENCOUNTER — Other Ambulatory Visit: Payer: Self-pay

## 2024-01-08 ENCOUNTER — Other Ambulatory Visit (HOSPITAL_COMMUNITY): Payer: Self-pay

## 2024-01-08 ENCOUNTER — Other Ambulatory Visit (INDEPENDENT_AMBULATORY_CARE_PROVIDER_SITE_OTHER): Payer: Self-pay | Admitting: Internal Medicine

## 2024-01-08 ENCOUNTER — Other Ambulatory Visit (HOSPITAL_BASED_OUTPATIENT_CLINIC_OR_DEPARTMENT_OTHER): Payer: Self-pay

## 2024-01-08 DIAGNOSIS — E1169 Type 2 diabetes mellitus with other specified complication: Secondary | ICD-10-CM

## 2024-01-08 MED ORDER — TIRZEPATIDE 2.5 MG/0.5ML ~~LOC~~ SOAJ
2.5000 mg | SUBCUTANEOUS | 0 refills | Status: DC
  Filled 2024-01-08: qty 2, 28d supply, fill #0

## 2024-01-08 NOTE — Progress Notes (Signed)
 Called patient to address concerns regarding swallowing she had been on medication for at least 5 to 6 weeks was tolerating it well.  She acknowledges that they eating a little bit more than usual which she feels might of caused that sense of fullness or sense of food getting stuck.  She feels well and denies any gastrointestinal symptoms at present time.  She may resume treatment with Mounjaro  at 2.5 mg once a week.  She will be reevaluated at the office as scheduled

## 2024-01-11 ENCOUNTER — Other Ambulatory Visit: Payer: Self-pay

## 2024-01-15 ENCOUNTER — Other Ambulatory Visit (HOSPITAL_COMMUNITY): Payer: Self-pay

## 2024-01-15 ENCOUNTER — Other Ambulatory Visit: Payer: Self-pay

## 2024-01-15 MED ORDER — LISDEXAMFETAMINE DIMESYLATE 60 MG PO CAPS
60.0000 mg | ORAL_CAPSULE | Freq: Every day | ORAL | 0 refills | Status: DC
  Filled 2024-01-15: qty 30, 30d supply, fill #0

## 2024-01-16 ENCOUNTER — Ambulatory Visit (INDEPENDENT_AMBULATORY_CARE_PROVIDER_SITE_OTHER): Admitting: Internal Medicine

## 2024-01-17 DIAGNOSIS — G4733 Obstructive sleep apnea (adult) (pediatric): Secondary | ICD-10-CM | POA: Diagnosis not present

## 2024-01-18 DIAGNOSIS — F4322 Adjustment disorder with anxiety: Secondary | ICD-10-CM | POA: Diagnosis not present

## 2024-01-23 ENCOUNTER — Encounter: Payer: Self-pay | Admitting: Pulmonary Disease

## 2024-01-23 ENCOUNTER — Other Ambulatory Visit: Payer: Self-pay

## 2024-01-28 ENCOUNTER — Other Ambulatory Visit (HOSPITAL_COMMUNITY): Payer: Self-pay

## 2024-01-28 ENCOUNTER — Ambulatory Visit (INDEPENDENT_AMBULATORY_CARE_PROVIDER_SITE_OTHER): Admitting: Internal Medicine

## 2024-01-28 ENCOUNTER — Encounter: Payer: Self-pay | Admitting: Family Medicine

## 2024-01-28 ENCOUNTER — Encounter (INDEPENDENT_AMBULATORY_CARE_PROVIDER_SITE_OTHER): Payer: Self-pay | Admitting: Internal Medicine

## 2024-01-28 VITALS — BP 127/83 | HR 72 | Temp 98.8°F | Ht 61.0 in | Wt 210.0 lb

## 2024-01-28 DIAGNOSIS — L4050 Arthropathic psoriasis, unspecified: Secondary | ICD-10-CM | POA: Diagnosis not present

## 2024-01-28 DIAGNOSIS — E66813 Obesity, class 3: Secondary | ICD-10-CM

## 2024-01-28 DIAGNOSIS — Z111 Encounter for screening for respiratory tuberculosis: Secondary | ICD-10-CM | POA: Diagnosis not present

## 2024-01-28 DIAGNOSIS — Z7985 Long-term (current) use of injectable non-insulin antidiabetic drugs: Secondary | ICD-10-CM | POA: Diagnosis not present

## 2024-01-28 DIAGNOSIS — L409 Psoriasis, unspecified: Secondary | ICD-10-CM | POA: Diagnosis not present

## 2024-01-28 DIAGNOSIS — Z7984 Long term (current) use of oral hypoglycemic drugs: Secondary | ICD-10-CM

## 2024-01-28 DIAGNOSIS — M150 Primary generalized (osteo)arthritis: Secondary | ICD-10-CM | POA: Diagnosis not present

## 2024-01-28 DIAGNOSIS — Z6841 Body Mass Index (BMI) 40.0 and over, adult: Secondary | ICD-10-CM | POA: Diagnosis not present

## 2024-01-28 DIAGNOSIS — G4733 Obstructive sleep apnea (adult) (pediatric): Secondary | ICD-10-CM

## 2024-01-28 DIAGNOSIS — Z7960 Long term (current) use of unspecified immunomodulators and immunosuppressants: Secondary | ICD-10-CM | POA: Diagnosis not present

## 2024-01-28 DIAGNOSIS — E1169 Type 2 diabetes mellitus with other specified complication: Secondary | ICD-10-CM | POA: Diagnosis not present

## 2024-01-28 MED ORDER — TIRZEPATIDE 5 MG/0.5ML ~~LOC~~ SOAJ
5.0000 mg | SUBCUTANEOUS | 0 refills | Status: DC
  Filled 2024-01-28 – 2024-02-04 (×4): qty 2, 28d supply, fill #0

## 2024-01-28 NOTE — Assessment & Plan Note (Signed)
 Obesity management with Mounjaro . Currently on 2.5 mg dose due to previous side effects. Weight loss has been challenging despite low caloric intake. She has lost 23% of her weight from her peak of 270 lbs, primarily through nutritional changes and increased physical activity. - Increase Mounjaro  dose to 5 mg to achieve therapeutic effect and enhance weight loss.  - Monitor weight and caloric intake. - Encourage addition of protein powder to meals to increase protein intake. - Advise on incorporating short bouts of exercise, such as 10-minute sessions, throughout the day.

## 2024-01-28 NOTE — Progress Notes (Signed)
 Office: [phone]  /  Fax: [phone]  Weight Summary and Body Composition Analysis (BIA)  Vitals Temp: 98.8 F (37.1 C) BP: 127/83 Pulse Rate: 72 SpO2: 99 %   Anthropometric Measurements Height: 5' 1 (1.549 m) Weight: 210 lb (95.3 kg) BMI (Calculated): 39.7 Weight at Last Visit: 209 lb Weight Lost Since Last Visit: 0 lb Weight Gained Since Last Visit: 1 lb Starting Weight: 218 lb Total Weight Loss (lbs): 8 lb (3.629 kg) Peak Weight: 280 lb   Body Composition  Body Fat %: 46.2 % Fat Mass (lbs): 97.2 lbs Muscle Mass (lbs): 107.2 lbs Total Body Water (lbs): 76.4 lbs Visceral Fat Rating : 15    RMR: 1886  Today's Visit #: 9  Starting Date: 07/12/23   Subjective   Chief Complaint: Obesity  Interval History Discussed the use of AI scribe software for clinical note transcription with the patient, who gave verbal consent to proceed.  History of Present Illness   Tabitha [name] is a 58 year old female with type 2 diabetes and sleep apnea who presents for medical weight management.  She is experiencing a flare of her arthritis, with significant pain in her shoulder and knee, as well as generalized body aches resembling flu-like symptoms. She has difficulty sleeping due to discomfort.  She has been on Mounjaro  for weight management, completing two months of treatment with some interruptions due to side effects. She has difficulty consuming more than 1200 calories most days, with appetite fluctuations post-injection. Occasional nausea and slow bowel movements are noted, but no other significant side effects.  Her current medications include metformin , which she tolerates well, and she reports good blood sugar control. She occasionally uses trazodone  for sleep, though she finds it minimally effective, and takes Vyvanse  for attention issues, which has also helped with 'food noise'. She has not used Adderall in several months.  Her weight history includes a  peak of 270 pounds in 2016, with a gradual decrease to her current weight of 210 pounds. She attributes her weight loss primarily to nutritional changes, with increased physical activity contributing during certain periods. She has experienced weight fluctuations due to life stressors, such as supporting a friend with cancer, which led to increased eating out and weight gain.  Her current physical activity includes strength training and cardio, though she finds it challenging to maintain a consistent schedule due to work demands. She is exploring ways to incorporate more activity into her routine, such as short sessions on a treadmill at home.       Challenges affecting patient progress: orthopedic problems, medical conditions or chronic pain affecting mobility and medical comorbidities.    Pharmacotherapy for weight management: She is currently taking Monjauro with diabetes as the primary indication and obesity secondary with adequate clinical response  and without side effects..   Assessment and Plan   Treatment Plan For Obesity:  Recommended Dietary Goals  Tabitha [name] is currently in the action stage of change. As such, her goal is to continue weight management plan. She has agreed to: continue current plan  Behavioral Health and Counseling  We discussed the following behavioral modification strategies today: continue to work on maintaining a reduced calorie state, getting the recommended amount of protein, incorporating whole foods, making healthy choices, staying well hydrated and practicing mindfulness when eating..  Additional education and resources provided today: None  Recommended Physical Activity Goals  Tabitha [name] has been advised to work up to 150 minutes of moderate intensity aerobic activity  a week and strengthening exercises 2-3 times per week for cardiovascular health, weight loss maintenance and preservation of muscle mass.   She has agreed to :  Think about enjoyable ways to  increase daily physical activity and overcoming barriers to exercise and Increase physical activity in their day and reduce sedentary time (increase NEAT).  Medical Interventions and Pharmacotherapy  We discussed various medication options to help Tabitha [name] with her weight loss efforts and we both agreed to : Increase Zepbound  to 5 mg once a week  Associated Conditions Impacted by Obesity Treatment  Assessment & Plan OSA (obstructive sleep apnea) On CPAP with reported good compliance. Continue PAP therapy. Losing 15% or more of body weight may improve AHI.  She benefits from GLP-1 aided weight management. Type 2 diabetes mellitus with other specified complication, without long-term current use of insulin  (HCC) Most recent A1c is 6.0 she is currently on Mounjaro  2.5 mg once a week and metformin .  She is no longer on Synjardy .  She will continue on combination for synergy.  We will increase Mounjaro  to 5 mg once a week she may be able to come off metformin .  She benefits from GLP-1 considering multiple cardiometabolic risk associated with obesity. Class 3 severe obesity with serious comorbidity and body mass index (BMI) of 40.0 to 44.9 in adult Obesity management with Mounjaro . Currently on 2.5 mg dose due to previous side effects. Weight loss has been challenging despite low caloric intake. She has lost 23% of her weight from her peak of 270 lbs, primarily through nutritional changes and increased physical activity. - Increase Mounjaro  dose to 5 mg to achieve therapeutic effect and enhance weight loss.  - Monitor weight and caloric intake. - Encourage addition of protein powder to meals to increase protein intake. - Advise on incorporating short bouts of exercise, such as 10-minute sessions, throughout the day.          Objective   Physical Exam:  Blood pressure 127/83, pulse 72, temperature 98.8 F (37.1 C), height 5' 1 (1.549 m), weight 210 lb (95.3 kg), SpO2 99%. Body mass index is  39.68 kg/m.  General: She is overweight, cooperative, alert, well developed, and in no acute distress. PSYCH: Has normal mood, affect and thought process.   HEENT: EOMI, sclerae are anicteric. Lungs: Normal breathing effort, no conversational dyspnea. Extremities: No edema.  Neurologic: No gross sensory or motor deficits. No tremors or fasciculations noted.    Diagnostic Data Reviewed:  BMET    Component Value Date/Time   NA 139 07/12/2023 0000   K 4.5 07/12/2023 0000   CL 100 07/12/2023 0000   CO2 22 07/12/2023 0000   GLUCOSE 140 (H) 04/04/2022 0000   BUN 15 07/12/2023 0000   CREATININE 0.6 07/12/2023 0000   CREATININE 0.72 04/04/2022 0000   CALCIUM 9.6 07/12/2023 0000   GFRNONAA 98 08/21/2019 0834   GFRAA 114 08/21/2019 0834   Lab Results  Component Value Date   HGBA1C 6.0 (A) 12/19/2023   HGBA1C 7.2 09/06/2011   Lab Results  Component Value Date   INSULIN  19.3 07/25/2023   Lab Results  Component Value Date   TSH 1.23 09/15/2022   CBC    Component Value Date/Time   WBC 6.6 07/12/2023 0000   WBC 7.9 04/04/2022 0000   RBC 5.09 07/12/2023 0000   HGB 15.3 07/12/2023 0000   HCT 46 07/12/2023 0000   PLT 183 07/12/2023 0000   MCV 91.1 04/04/2022 0000   MCH 30.6 04/04/2022 0000  MCHC 33.5 04/04/2022 0000   RDW 11.8 04/04/2022 0000   Iron Studies    Component Value Date/Time   FERRITIN 71 09/15/2022 0000   Lipid Panel     Component Value Date/Time   CHOL 183 07/25/2023 0754   TRIG 119 07/25/2023 0754   HDL 54 07/25/2023 0754   CHOLHDL 2.4 04/04/2022 0000   VLDL 25.0 09/25/2019 0920   LDLCALC 108 (H) 07/25/2023 0754   LDLCALC 87 04/04/2022 0000   LDLDIRECT 149 (H) 12/06/2011 1702   Hepatic Function Panel     Component Value Date/Time   PROT 7.0 04/04/2022 0000   ALBUMIN 4.5 07/12/2023 0000   AST 23 07/12/2023 0000   ALT 33 07/12/2023 0000   ALKPHOS 99 07/12/2023 0000   BILITOT 0.5 04/04/2022 0000      Component Value Date/Time   TSH 1.23  09/15/2022 0000   TSH 1.02 12/11/2018 1102   Nutritional Lab Results  Component Value Date   VD25OH 57.2 07/25/2023   VD25OH 38.8 09/15/2022    Medications: Outpatient Encounter Medications as of 01/28/2024  Medication Sig Note   ACCU-CHEK GUIDE test strip  11/26/2020: use   Acetaminophen  (TYLENOL  PO) Take by mouth as needed. Not sure the dose    amphetamine -dextroamphetamine  (ADDERALL) 10 MG tablet Take 1 tablet by mouth daily as directed    bimekizumab -bkzx (BIMZELX ) 160 MG/ML pen Inject 320 mg subcutaneously every 8 (eight) weeks MAINTENANCE DOSE:    calcipotriene  (DOVONOX) 0.005 % cream Apply topically 2 (two) times daily. Apply to ears and face until areas are clear    celecoxib  (CELEBREX ) 100 MG capsule Take 1 capsule (100 mg total) by mouth 2 (two) times daily.    clobetasol  (TEMOVATE ) 0.05 % external solution Apply topically 2 (two) times daily    clotrimazole -betamethasone  (LOTRISONE ) cream Apply to affected area(s) on skin 2 (two) times daily.    Continuous Glucose Sensor (FREESTYLE LIBRE 3 PLUS SENSOR) MISC Change sensor every 15 days.    desonide  (DESOWEN ) 0.05 % cream Apply 1 application topically 2 (two) times daily.    glucose blood (FREESTYLE LITE) test strip Use to check blood sugar 3 times a day    lisdexamfetamine (VYVANSE ) 60 MG capsule Take 1 capsule by mouth daily    metFORMIN  (GLUCOPHAGE -XR) 500 MG 24 hr tablet Take 1 tablet (500 mg total) by mouth every evening.    mupirocin  ointment (BACTROBAN ) 2 % Apply 1 Application topically 2 (two) times daily.    Naltrexone HCl, Pain, 4.5 MG CAPS Take 1 tablet by mouth every morning.    nystatin -triamcinolone  ointment (MYCOLOG) Apply 1 Application topically 2 (two) times daily. Apply for 5 days then STOP    Omega-3 Fatty Acids (FISH OIL) 1200 MG CAPS Take 1,200-2,400 mg by mouth 2 (two) times daily. Takes 2400 mg qam and 1200 mg qpm    tirzepatide  (MOUNJARO ) 5 MG/0.5ML Pen Inject 5 mg into the skin once a week.    traZODone   (DESYREL ) 50 MG tablet Take 1-2 tablets (50-100 mg total) by mouth at bedtime.    [DISCONTINUED] tirzepatide  (MOUNJARO ) 2.5 MG/0.5ML Pen Inject 2.5 mg into the skin once a week.    [DISCONTINUED] doxycycline  (VIBRA -TABS) 100 MG tablet Take 1 tablet (100 mg total) by mouth 2 (two) times daily.    No facility-administered encounter medications on file as of 01/28/2024.     Follow-Up   Return in about 4 weeks (around 02/25/2024) for For Weight Mangement with Dr. Francyne.SABRA She was informed of the importance of  frequent follow up visits to maximize her success with intensive lifestyle modifications for her multiple health conditions.  Attestation Statement   Reviewed by clinician on day of visit: allergies, medications, problem list, medical history, surgical history, family history, social history, and previous encounter notes.     Lucas Parker, MD

## 2024-01-28 NOTE — Assessment & Plan Note (Signed)
 Most recent A1c is 6.0 she is currently on Mounjaro  2.5 mg once a week and metformin .  She is no longer on Synjardy .  She will continue on combination for synergy.  We will increase Mounjaro  to 5 mg once a week she may be able to come off metformin .  She benefits from GLP-1 considering multiple cardiometabolic risk associated with obesity.

## 2024-01-28 NOTE — Assessment & Plan Note (Signed)
 On CPAP with reported good compliance. Continue PAP therapy. Losing 15% or more of body weight may improve AHI.  She benefits from GLP-1 aided weight management.

## 2024-01-29 ENCOUNTER — Other Ambulatory Visit (HOSPITAL_COMMUNITY): Payer: Self-pay

## 2024-01-29 ENCOUNTER — Other Ambulatory Visit: Payer: Self-pay

## 2024-01-29 DIAGNOSIS — M25511 Pain in right shoulder: Secondary | ICD-10-CM

## 2024-01-29 DIAGNOSIS — F4322 Adjustment disorder with anxiety: Secondary | ICD-10-CM | POA: Diagnosis not present

## 2024-01-30 ENCOUNTER — Other Ambulatory Visit: Payer: Self-pay

## 2024-01-30 ENCOUNTER — Other Ambulatory Visit (HOSPITAL_COMMUNITY): Payer: Self-pay

## 2024-02-04 ENCOUNTER — Other Ambulatory Visit (HOSPITAL_COMMUNITY): Payer: Self-pay

## 2024-02-05 ENCOUNTER — Other Ambulatory Visit: Payer: Self-pay

## 2024-02-05 ENCOUNTER — Other Ambulatory Visit: Payer: Self-pay | Admitting: Pharmacy Technician

## 2024-02-05 ENCOUNTER — Other Ambulatory Visit (HOSPITAL_COMMUNITY): Payer: Self-pay

## 2024-02-05 ENCOUNTER — Telehealth: Payer: Self-pay

## 2024-02-05 MED ORDER — TREMFYA PEN 100 MG/ML ~~LOC~~ SOAJ: SUBCUTANEOUS | 6 refills | Status: DC

## 2024-02-05 MED ORDER — TREMFYA PEN 100 MG/ML ~~LOC~~ SOAJ
SUBCUTANEOUS | 1 refills | Status: DC
  Filled 2024-02-05: qty 1, fill #0

## 2024-02-05 NOTE — Telephone Encounter (Signed)
 Pharmacy Patient Advocate Encounter   Received notification from Patient Pharmacy that prior authorization for Tremfya  is required/requested.   Insurance verification completed.   The patient is insured through [facility] .   Per test claim: PA required; PA submitted to above mentioned insurance via Latent Key/confirmation #/EOC [facility] Status is pending

## 2024-02-05 NOTE — Progress Notes (Signed)
 Patient is switching to Tremfya . PA required.

## 2024-02-06 ENCOUNTER — Ambulatory Visit: Attending: Family Medicine | Admitting: Pharmacist

## 2024-02-06 ENCOUNTER — Other Ambulatory Visit: Payer: Self-pay

## 2024-02-06 ENCOUNTER — Other Ambulatory Visit: Payer: Self-pay | Admitting: Pharmacist

## 2024-02-06 DIAGNOSIS — Z79899 Other long term (current) drug therapy: Secondary | ICD-10-CM

## 2024-02-06 MED ORDER — TREMFYA PEN 100 MG/ML ~~LOC~~ SOAJ
SUBCUTANEOUS | 1 refills | Status: DC
  Filled 2024-02-06: qty 1, 28d supply, fill #0
  Filled 2024-02-28: qty 1, 56d supply, fill #1

## 2024-02-06 MED ORDER — TREMFYA PEN 100 MG/ML ~~LOC~~ SOAJ
SUBCUTANEOUS | 6 refills | Status: AC
  Filled 2024-02-06: qty 1, fill #0
  Filled 2024-04-23: qty 1, 56d supply, fill #0
  Filled 2024-06-20: qty 1, 56d supply, fill #1

## 2024-02-06 NOTE — Progress Notes (Signed)
 PA approved.   Insurance verification completed.   The patient is insured through [facility]   Ran test claim for Tremfya . Co-pay is $0.  This test claim was processed through [facility]- copay amounts may vary at other pharmacies due to pharmacy/plan contracts, or as the patient moves through the different stages of their insurance plan.

## 2024-02-06 NOTE — Telephone Encounter (Signed)
 Pharmacy Patient Advocate Encounter  Received notification from [employer] that Prior Authorization for Tremfya  has been APPROVED from 02/06/24 to 07/07/24   PA #/Case ID/Reference #: [id number]   Initial PA approved-1 ml per 28 days x1 and maintenance approved-1 ml per 56 days x5 months.

## 2024-02-06 NOTE — Progress Notes (Signed)
 Specialty Pharmacy Initial Fill Coordination Note  Tabitha [name] is a 58 y.o. female contacted today regarding initial fill of specialty medication(s) Guselkumab  (Tremfya  Pen)   Patient requested [name] at [facility] at [place] date: 02/07/24   Medication will be filled on 8/28.   Patient is aware of $0 copayment.

## 2024-02-06 NOTE — Progress Notes (Signed)
 Please see OV from 02/06/24 for complete documentation.   Herlene Fleeta Minion, PharmD, JAQUELINE, CPP Clinical Pharmacist [facility] [phone]

## 2024-02-06 NOTE — Progress Notes (Signed)
   S: Patient presents today for review of their specialty medication.   Patient is currently taking Tremfya  for psoriasis. Patient is managed by Dr. Tobie for this.   Dosing: Psoriatic arthritis: SubQ: 100 mg at weeks 0, 4, and then every 8 weeks thereafter.  Adherence: has not started   Efficacy: has not started   Monitoring:  S/sx of infection: none  S/sx of hypersensitivity: none   Current adverse effects: none   O:     Lab Results  Component Value Date   WBC 6.6 07/12/2023   HGB 15.3 07/12/2023   HCT 46 07/12/2023   MCV 91.1 04/04/2022   PLT 183 07/12/2023      Chemistry      Component Value Date/Time   NA 139 07/12/2023 0000   K 4.5 07/12/2023 0000   CL 100 07/12/2023 0000   CO2 22 07/12/2023 0000   BUN 15 07/12/2023 0000   CREATININE 0.6 07/12/2023 0000   CREATININE 0.72 04/04/2022 0000   GLU 165 07/12/2023 0000      Component Value Date/Time   CALCIUM 9.6 07/12/2023 0000   ALKPHOS 99 07/12/2023 0000   AST 23 07/12/2023 0000   ALT 33 07/12/2023 0000   BILITOT 0.5 04/04/2022 0000       A/P: 1. Medication review: patient currently on Tremfya  for plaque psoriasis/psoriatic arthritis. Reviewed the medication with the patient, including the following: Tremfya  is a medication used in the treatment of plaque psoriasis. Administer SubQ into front of thighs, lower abdomen (except for 2 inches around navel), or back of upper arms; do not inject into areas where the skin is tender, bruised, red, hard, thick, scaly, or affected by psoriasis. Possible adverse reactions include increased risk of infection, headache, and hypersensitivity reactions. Live vaccinations should be avoided. No recommendations for any changes.  Herlene Fleeta Heiden, PharmD, JAQUELINE, CPP Clinical Pharmacist [facility] [phone]

## 2024-02-06 NOTE — Progress Notes (Signed)
 [name] [address] [place] [address] Phone: [phone] Subjective:   Tabitha [name], am serving as a scribe for Dr. Arthea Claudene.  I'm seeing this patient by the request  of:  Alvia Bring, DO  CC: Neck, back pain, shoulder pain follow-up.  Also worsening knee pain.  [name]  Tabitha [name] is a 58 y.o. female coming in with complaint of back and neck pain. OMT on 12/11/2023. Patient states having some R shoulder pain, having MRI soon. And 2nd toe pain, just asking about it.  Medications patient has been prescribed:   Taking:      Patient is scheduled for a right shoulder MRI arthrogram on September 4.   Reviewed prior external information including notes and imaging from previsou exam, outside providers and external EMR if available.   As well as notes that were available from care everywhere and other healthcare systems.  Past medical history, social, surgical and family history all reviewed in electronic medical record.  No pertanent information unless stated regarding to the chief complaint.   Past Medical History:  Diagnosis Date   A-fib (HCC)    Alcohol abuse    Anemia    Arthritis    Asthma    Back pain    De Quervain's disease (tenosynovitis)    Dermatophytosis, scalp    Diabetes mellitus    Drug use    Edema of both lower extremities    Fatty liver    Female infertility    Gallbladder problem    Hypercholesteremia    Joint pain    Obesity    Osteoarthritis    Palpitations    PCOS (polycystic ovarian syndrome)    Polycystic ovarian disease    Psoriatic arthritis (HCC)    SOB (shortness of breath)    Vitamin D  deficiency    Wears glasses     Allergies  Allergen Reactions   Chocolate Anaphylaxis   Corn-Containing Products Nausea Only    Fatigue, body aches, upset stomach,   Food     chocolate   Gluten Meal    Latex Rash   Methotrexate  Rash     Review of Systems:  No headache, visual  changes, nausea, vomiting, diarrhea, constipation, dizziness, abdominal pain, skin rash, fevers, chills, night sweats, weight loss, swollen lymph nodes, body aches, joint swelling, chest pain, shortness of breath, mood changes. POSITIVE muscle aches  Objective  Blood pressure (!) 140/80, pulse 88, height 5' 1 (1.549 m), SpO2 98%.   General: No apparent distress alert and oriented x3 mood and affect normal, dressed appropriately.  HEENT: Pupils equal, extraocular movements intact  Respiratory: Patient's speak in full sentences and does not appear short of breath  Cardiovascular: No lower extremity edema, non tender, no erythema  MSK:  Right shoulder exam shows positive impingement patient does have some weakness with 4 out of 5 strength of the right shoulder. Back loss of lordosis tightness on the right side.   Osteopathic findings  C3 flexed rotated and side bent right C5 flexed rotated and side bent right T3 extended rotated and side bent right inhaled rib T5 extended rotated and side bent right L2 flexed rotated and side bent right Sacrum right on right       Assessment and Plan:  Low back pain Does have some loss lordosis noted.  Some tenderness to palpation in the paraspinal musculature.  Some tightness noted with FABER.  Some limited sidebending bilaterally.  Will continue to  monitor he does respond fairly well to osteopathic manipulation.  Follow-up again in 6 to 8 weeks.    Nonallopathic problems  Decision today to treat with OMT was based on Physical Exam  After verbal consent patient was treated with HVLA, ME, FPR techniques in cervical, rib, thoracic, lumbar, and sacral  areas  Patient tolerated the procedure well with improvement in symptoms  Patient given exercises, stretches and lifestyle modifications  See medications in patient instructions if given  Patient will follow up in 4-8 weeks    The above documentation has been reviewed and is accurate and  complete Ryelle Ruvalcaba M Jocelynne Duquette, DO          Note: This dictation was prepared with Dragon dictation along with smaller phrase technology. Any transcriptional errors that result from this process are unintentional.

## 2024-02-07 ENCOUNTER — Other Ambulatory Visit: Payer: Self-pay

## 2024-02-08 ENCOUNTER — Encounter: Payer: Self-pay | Admitting: Family Medicine

## 2024-02-12 ENCOUNTER — Encounter: Payer: Self-pay | Admitting: Family Medicine

## 2024-02-12 ENCOUNTER — Other Ambulatory Visit (HOSPITAL_COMMUNITY): Payer: Self-pay

## 2024-02-12 ENCOUNTER — Ambulatory Visit (INDEPENDENT_AMBULATORY_CARE_PROVIDER_SITE_OTHER): Admitting: Family Medicine

## 2024-02-12 VITALS — BP 140/80 | HR 88 | Ht 61.0 in

## 2024-02-12 DIAGNOSIS — M9902 Segmental and somatic dysfunction of thoracic region: Secondary | ICD-10-CM

## 2024-02-12 DIAGNOSIS — M545 Low back pain, unspecified: Secondary | ICD-10-CM

## 2024-02-12 DIAGNOSIS — M9904 Segmental and somatic dysfunction of sacral region: Secondary | ICD-10-CM

## 2024-02-12 DIAGNOSIS — M9908 Segmental and somatic dysfunction of rib cage: Secondary | ICD-10-CM

## 2024-02-12 DIAGNOSIS — M9901 Segmental and somatic dysfunction of cervical region: Secondary | ICD-10-CM | POA: Diagnosis not present

## 2024-02-12 DIAGNOSIS — M9903 Segmental and somatic dysfunction of lumbar region: Secondary | ICD-10-CM

## 2024-02-12 DIAGNOSIS — G8929 Other chronic pain: Secondary | ICD-10-CM

## 2024-02-12 MED ORDER — LISDEXAMFETAMINE DIMESYLATE 60 MG PO CAPS
60.0000 mg | ORAL_CAPSULE | Freq: Every day | ORAL | 0 refills | Status: DC
  Filled 2024-02-12: qty 30, 30d supply, fill #0

## 2024-02-12 NOTE — Assessment & Plan Note (Signed)
Continue to work on weight loss 

## 2024-02-12 NOTE — Assessment & Plan Note (Signed)
 Does have some loss lordosis noted.  Some tenderness to palpation in the paraspinal musculature.  Some tightness noted with FABER.  Some limited sidebending bilaterally.  Will continue to monitor he does respond fairly well to osteopathic manipulation.  Follow-up again in 6 to 8 weeks.

## 2024-02-12 NOTE — Patient Instructions (Signed)
 Good to see you as always. See me again in 6 to 8 weeks.

## 2024-02-13 ENCOUNTER — Other Ambulatory Visit: Payer: Self-pay | Admitting: Family Medicine

## 2024-02-13 ENCOUNTER — Other Ambulatory Visit: Payer: Self-pay

## 2024-02-13 ENCOUNTER — Ambulatory Visit (INDEPENDENT_AMBULATORY_CARE_PROVIDER_SITE_OTHER): Admitting: Family Medicine

## 2024-02-13 ENCOUNTER — Other Ambulatory Visit (HOSPITAL_COMMUNITY): Payer: Self-pay

## 2024-02-13 VITALS — BP 118/73 | HR 74 | Ht 61.0 in | Wt 210.0 lb

## 2024-02-13 DIAGNOSIS — Z0000 Encounter for general adult medical examination without abnormal findings: Secondary | ICD-10-CM

## 2024-02-13 DIAGNOSIS — E1169 Type 2 diabetes mellitus with other specified complication: Secondary | ICD-10-CM

## 2024-02-13 DIAGNOSIS — Z7984 Long term (current) use of oral hypoglycemic drugs: Secondary | ICD-10-CM

## 2024-02-13 DIAGNOSIS — F5101 Primary insomnia: Secondary | ICD-10-CM | POA: Diagnosis not present

## 2024-02-13 DIAGNOSIS — F4322 Adjustment disorder with anxiety: Secondary | ICD-10-CM | POA: Diagnosis not present

## 2024-02-13 MED ORDER — DOXEPIN HCL 3 MG PO TABS
1.0000 | ORAL_TABLET | Freq: Every evening | ORAL | 1 refills | Status: DC | PRN
  Filled 2024-02-13 – 2024-02-22 (×2): qty 90, 45d supply, fill #0

## 2024-02-13 NOTE — Assessment & Plan Note (Signed)
 Well adult Orders Placed This Encounter  Procedures   Lipid Panel With LDL/HDL Ratio   Urine Microalbumin w/creat. ratio  Screenings: per lab orders.  Immunizations: UTD Anticipatory guidance/Risk factor reduction:  Recommendations per AVS.

## 2024-02-13 NOTE — Patient Instructions (Signed)
 Preventive Care 58-59 Years Old, Female  Preventive care refers to lifestyle choices and visits with your health care provider that can promote health and wellness. Preventive care visits are also called wellness exams.  What can I expect for my preventive care visit?  Counseling  Your health care provider may ask you questions about your:  Medical history, including:  Past medical problems.  Family medical history.  Pregnancy history.  Current health, including:  Menstrual cycle.  Method of birth control.  Emotional well-being.  Home life and relationship well-being.  Sexual activity and sexual health.  Lifestyle, including:  Alcohol, nicotine or tobacco, and drug use.  Access to firearms.  Diet, exercise, and sleep habits.  Work and work Astronomer.  Sunscreen use.  Safety issues such as seatbelt and bike helmet use.  Physical exam  Your health care provider will check your:  Height and weight. These may be used to calculate your BMI (body mass index). BMI is a measurement that tells if you are at a healthy weight.  Waist circumference. This measures the distance around your waistline. This measurement also tells if you are at a healthy weight and may help predict your risk of certain diseases, such as type 2 diabetes and high blood pressure.  Heart rate and blood pressure.  Body temperature.  Skin for abnormal spots.  What immunizations do I need?    Vaccines are usually given at various ages, according to a schedule. Your health care provider will recommend vaccines for you based on your age, medical history, and lifestyle or other factors, such as travel or where you work.  What tests do I need?  Screening  Your health care provider may recommend screening tests for certain conditions. This may include:  Lipid and cholesterol levels.  Diabetes screening. This is done by checking your blood sugar (glucose) after you have not eaten for a while (fasting).  Pelvic exam and Pap test.  Hepatitis B test.  Hepatitis C  test.  HIV (human immunodeficiency virus) test.  STI (sexually transmitted infection) testing, if you are at risk.  Lung cancer screening.  Colorectal cancer screening.  Mammogram. Talk with your health care provider about when you should start having regular mammograms. This may depend on whether you have a family history of breast cancer.  BRCA-related cancer screening. This may be done if you have a family history of breast, ovarian, tubal, or peritoneal cancers.  Bone density scan. This is done to screen for osteoporosis.  Talk with your health care provider about your test results, treatment options, and if necessary, the need for more tests.  Follow these instructions at home:  Eating and drinking    Eat a diet that includes fresh fruits and vegetables, whole grains, lean protein, and low-fat dairy products.  Take vitamin and mineral supplements as recommended by your health care provider.  Do not drink alcohol if:  Your health care provider tells you not to drink.  You are pregnant, may be pregnant, or are planning to become pregnant.  If you drink alcohol:  Limit how much you have to 0-1 drink a day.  Know how much alcohol is in your drink. In the [place] one drink equals one 12 oz bottle of beer (355 mL), one 5 oz glass of wine (148 mL), or one 1 oz glass of hard liquor (44 mL).  Lifestyle  Brush your teeth every morning and night with fluoride toothpaste. Floss one time each day.  Exercise for at least  30 minutes 5 or more days each week.  Do not use any products that contain nicotine or tobacco. These products include cigarettes, chewing tobacco, and vaping devices, such as e-cigarettes. If you need help quitting, ask your health care provider.  Do not use drugs.  If you are sexually active, practice safe sex. Use a condom or other form of protection to prevent STIs.  If you do not wish to become pregnant, use a form of birth control. If you plan to become pregnant, see your health care provider for a  prepregnancy visit.  Take aspirin only as told by your health care provider. Make sure that you understand how much to take and what form to take. Work with your health care provider to find out whether it is safe and beneficial for you to take aspirin daily.  Find healthy ways to manage stress, such as:  Meditation, yoga, or listening to music.  Journaling.  Talking to a trusted person.  Spending time with friends and family.  Minimize exposure to UV radiation to reduce your risk of skin cancer.  Safety  Always wear your seat belt while driving or riding in a vehicle.  Do not drive:  If you have been drinking alcohol. Do not ride with someone who has been drinking.  When you are tired or distracted.  While texting.  If you have been using any mind-altering substances or drugs.  Wear a helmet and other protective equipment during sports activities.  If you have firearms in your house, make sure you follow all gun safety procedures.  Seek help if you have been physically or sexually abused.  What's next?  Visit your health care provider once a year for an annual wellness visit.  Ask your health care provider how often you should have your eyes and teeth checked.  Stay up to date on all vaccines.  This information is not intended to replace advice given to you by your health care provider. Make sure you discuss any questions you have with your health care provider.  Document Revised: 11/24/2020 Document Reviewed: 11/24/2020  Elsevier Patient Education  2024 [employer].

## 2024-02-13 NOTE — Progress Notes (Signed)
 Tabitha [name] - 58 y.o. female MRN [id number]  Date of birth: 04/08/1966  Subjective Chief Complaint  Patient presents with   Annual Exam    HPI Tabitha [name] is a 58 y.o. female here today for annual exam.   She reports that she is doing ok.  Stressful day today.  Having some increased sleeping difficulty.    She does try to stay pretty active.  Working on diet.  Increasing protein intake.   She is a non-smoker.  Rare EtOH intake.   She plans to have pap with gyn.    Review of Systems  Constitutional:  Negative for chills, fever, malaise/fatigue and weight loss.  HENT:  Negative for congestion, ear pain and sore throat.   Eyes:  Negative for blurred vision, double vision and pain.  Respiratory:  Negative for cough and shortness of breath.   Cardiovascular:  Negative for chest pain and palpitations.  Gastrointestinal:  Negative for abdominal pain, blood in stool, constipation, heartburn and nausea.  Genitourinary:  Negative for dysuria and urgency.  Musculoskeletal:  Negative for joint pain and myalgias.  Neurological:  Negative for dizziness and headaches.  Endo/Heme/Allergies:  Does not bruise/bleed easily.  Psychiatric/Behavioral:  Negative for depression. The patient is not nervous/anxious and does not have insomnia.       Allergies  Allergen Reactions   Chocolate Anaphylaxis   Corn-Containing Products Nausea Only    Fatigue, body aches, upset stomach,   Food     chocolate   Gluten Meal    Latex Rash   Methotrexate  Rash    Past Medical History:  Diagnosis Date   A-fib (HCC)    Alcohol abuse    Allergy 15+ Years ago   Chocolate seasonal fall allergies   Anemia    Arrhythmia May 2020   Arthritis    Asthma    Back pain    Clotting disorder (HCC)    chronic nose bleeds since childhood   De Quervain's disease (tenosynovitis)    Depression Previous history not currently being treated   Dermatophytosis, scalp    Diabetes mellitus    Drug use    Edema of  both lower extremities    Fatty liver    Female infertility    Gallbladder problem    Hypercholesteremia    Joint pain    Obesity    Osteoarthritis    Palpitations    PCOS (polycystic ovarian syndrome)    Polycystic ovarian disease    Psoriatic arthritis (HCC)    Sleep apnea CPAP therapy started in [employer]   SOB (shortness of breath)    Vitamin D  deficiency    Wears glasses     Past Surgical History:  Procedure Laterality Date   BREAST BIOPSY Right    CARPAL TUNNEL RELEASE     COLONOSCOPY     DE QUERVAIN'S RELEASE  2012   left   KNEE ARTHROPLASTY     KNEE ARTHROSCOPY Right 05/06/2013   Procedure: RIGHT KNEE ARTHROSCOPY ;  Surgeon: Maude KANDICE Herald, MD;  Location: [facility];  Service: Orthopedics;  Laterality: Right;  partial lateral minisectomy and chondroplasty   WISDOM TOOTH EXTRACTION     WRIST SURGERY  2001   carpel tunnel -rt    Social History   Socioeconomic History   Marital status: Married    Spouse name: Carlin   Number of children: Not on file   Years of education: Not on file   Highest education level: Master's degree (  e.g., MA, MS, MEng, MEd, MSW, MBA)  Occupational History   Occupation: nurse    Employer: [employer]    Comment: at wom[employer]   Occupation: Nurse, children's, 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   Vaping Use   Vaping status: Never Used  Substance and Sexual Activity   Alcohol use: Yes    Alcohol/week: 2.0 - 3.0 standard drinks of alcohol    Types: 2 - 3 Shots of liquor per week    Comment: 1-4 drinks/ month   Drug use: Not Currently    Types: Marijuana    Comment: Rare useage 1-2  x year   Sexual activity: Yes    Partners: Male    Birth control/protection: Post-menopausal, None    Comment: married  Other Topics Concern   Not on file  Social History Narrative   Not on file   Social Drivers of Health   Financial Resource Strain: Low Risk  (02/13/2024)   Overall Financial Resource Strain (CARDIA)    Difficulty of Paying Living Expenses: Not hard at all  Food Insecurity: No Food Insecurity (02/13/2024)   Hunger Vital Sign    Worried About Running Out of Food in the Last Year: Never true    Ran Out of Food in the Last Year: Never true  Transportation Needs: No Transportation Needs (02/13/2024)   PRAPARE - Administrator, Civil Service (Medical): No    Lack of Transportation (Non-Medical): No  Physical Activity: Sufficiently Active (02/13/2024)   Exercise Vital Sign    Days of Exercise per Week: 4 days    Minutes of Exercise per Session: 40 min  Stress: Stress Concern Present (02/13/2024)   [employer] of Occupational Health - Occupational Stress Questionnaire    Feeling of Stress: To some extent  Social Connections: Moderately Isolated (02/13/2024)   Social Connection and Isolation Panel    Frequency of Communication with Friends and Family: More than three times a week    Frequency of Social Gatherings with Friends and Family: Once a week    Attends Religious Services: Never    Database administrator or Organizations: No    Attends Engineer, structural: Not on file    Marital Status: Married    Family History  Problem Relation Age of Onset   Cancer Mother 40       breast ca died at age 77   Breast cancer Mother 62   Depression Mother    Anxiety disorder Mother    Obesity Mother    Early death Mother    Diabetes Father    Hypertension Father    Stroke Father    Kidney disease Father    Heart disease Father    High Cholesterol Father    Obesity Father    Obesity Brother    Hypertension Brother    Heart disease Maternal  Grandmother    Alzheimer's disease Maternal Grandmother    Hyperlipidemia Maternal Grandmother    Diabetes Paternal Grandmother    Obesity Paternal Grandmother    Heart disease Paternal Grandfather  Hypertension Other    Diabetes type II Other    Obesity Other     Health Maintenance  Topic Date Due   Hepatitis B Vaccines 19-59 Average Risk (1 of 3 - 19+ 3-dose series) Never done   Cervical Cancer Screening (HPV/Pap Cotest)  08/13/2023   Diabetic kidney evaluation - Urine ACR  02/02/2024   INFLUENZA VACCINE  09/09/2024 (Originally 01/11/2024)   Hepatitis C Screening  02/12/2025 (Originally 08/13/1982)   COVID-19 Vaccine (8 - 2025-26 season) 02/28/2025 (Originally 02/11/2024)   OPHTHALMOLOGY EXAM  06/17/2024   HEMOGLOBIN A1C  06/20/2024   Diabetic kidney evaluation - eGFR measurement  07/11/2024   MAMMOGRAM  07/12/2024   FOOT EXAM  02/12/2025   DTaP/Tdap/Td (3 - Td or Tdap) 01/06/2032   Colonoscopy  05/29/2032   Pneumococcal Vaccine: 50+ Years  Completed   HIV Screening  Completed   Zoster Vaccines- Shingrix   Completed   HPV VACCINES  Aged Out   Meningococcal B Vaccine  Aged Out     ----------------------------------------------------------------------------------------------------------------------------------------------------------------------------------------------------------------- Physical Exam BP 118/73 (BP Location: Left Arm, Patient Position: Sitting, Cuff Size: Normal)   Pulse 74   Ht 5' 1 (1.549 m)   Wt 210 lb (95.3 kg)   SpO2 100%   BMI 39.68 kg/m   Physical Exam Constitutional:      General: She is not in acute distress. HENT:     Head: Normocephalic and atraumatic.     Right Ear: Tympanic membrane and ear canal normal.     Left Ear: Tympanic membrane and ear canal normal.     Nose: Nose normal.  Eyes:     General: No scleral icterus.    Conjunctiva/sclera: Conjunctivae normal.  Neck:     Thyroid : No thyromegaly.  Cardiovascular:     Rate and  Rhythm: Normal rate and regular rhythm.     Heart sounds: Normal heart sounds.  Pulmonary:     Effort: Pulmonary effort is normal.     Breath sounds: Normal breath sounds.  Abdominal:     General: Bowel sounds are normal. There is no distension.     Palpations: Abdomen is soft.     Tenderness: There is no abdominal tenderness. There is no guarding.  Musculoskeletal:        General: Normal range of motion.     Cervical back: Normal range of motion and neck supple.  Lymphadenopathy:     Cervical: No cervical adenopathy.  Skin:    General: Skin is warm and dry.     Findings: No rash.  Neurological:     General: No focal deficit present.     Mental Status: She is alert and oriented to person, place, and time.     Cranial Nerves: No cranial nerve deficit.     Coordination: Coordination normal.  Psychiatric:        Mood and Affect: Mood normal.        Behavior: Behavior normal.     ------------------------------------------------------------------------------------------------------------------------------------------------------------------------------------------------------------------- Assessment and Plan  Well adult exam Well adult Orders Placed This Encounter  Procedures   Lipid Panel With LDL/HDL Ratio   Urine Microalbumin w/creat. ratio  Screenings: per lab orders.  Immunizations: UTD Anticipatory guidance/Risk factor reduction:  Recommendations per AVS.    Insomnia Stop trazodone .  Start doxepin  to replace trazodone .    Meds ordered this encounter  Medications   Doxepin  HCl 3 MG TABS    Sig: Take 1-2 tablets (3-6 mg total) by mouth at bedtime as needed.    Dispense:  90 tablet    Refill:  1    No follow-ups on file.

## 2024-02-13 NOTE — Assessment & Plan Note (Signed)
 Stop trazodone .  Start doxepin  to replace trazodone .

## 2024-02-14 ENCOUNTER — Ambulatory Visit
Admission: RE | Admit: 2024-02-14 | Discharge: 2024-02-14 | Disposition: A | Discharge status: Home / Self Care | Source: Ambulatory Visit | Attending: Family Medicine | Admitting: Family Medicine

## 2024-02-14 ENCOUNTER — Other Ambulatory Visit (HOSPITAL_BASED_OUTPATIENT_CLINIC_OR_DEPARTMENT_OTHER): Payer: Self-pay

## 2024-02-14 DIAGNOSIS — M25511 Pain in right shoulder: Secondary | ICD-10-CM

## 2024-02-14 DIAGNOSIS — M75121 Complete rotator cuff tear or rupture of right shoulder, not specified as traumatic: Secondary | ICD-10-CM | POA: Diagnosis not present

## 2024-02-14 MED ORDER — IOPAMIDOL (ISOVUE-M 200) INJECTION 41%
10.0000 mL | Freq: Once | INTRAMUSCULAR | Status: AC
  Administered 2024-02-14: 10 mL via INTRA_ARTICULAR

## 2024-02-15 ENCOUNTER — Other Ambulatory Visit: Payer: Self-pay

## 2024-02-15 ENCOUNTER — Other Ambulatory Visit (HOSPITAL_COMMUNITY): Payer: Self-pay

## 2024-02-16 ENCOUNTER — Encounter: Payer: Self-pay | Admitting: Family Medicine

## 2024-02-16 ENCOUNTER — Other Ambulatory Visit (HOSPITAL_COMMUNITY): Payer: Self-pay

## 2024-02-17 ENCOUNTER — Ambulatory Visit: Payer: Self-pay | Admitting: Family Medicine

## 2024-02-18 ENCOUNTER — Other Ambulatory Visit: Payer: Self-pay

## 2024-02-18 DIAGNOSIS — M25511 Pain in right shoulder: Secondary | ICD-10-CM

## 2024-02-20 ENCOUNTER — Encounter: Admitting: Family Medicine

## 2024-02-22 ENCOUNTER — Other Ambulatory Visit (HOSPITAL_COMMUNITY): Payer: Self-pay

## 2024-02-25 ENCOUNTER — Other Ambulatory Visit: Payer: Self-pay

## 2024-02-25 ENCOUNTER — Other Ambulatory Visit (HOSPITAL_COMMUNITY): Payer: Self-pay

## 2024-02-26 ENCOUNTER — Ambulatory Visit: Admitting: Pulmonary Disease

## 2024-02-26 ENCOUNTER — Other Ambulatory Visit (HOSPITAL_COMMUNITY): Payer: Self-pay

## 2024-02-26 ENCOUNTER — Encounter: Payer: Self-pay | Admitting: Family Medicine

## 2024-02-26 VITALS — BP 132/81 | HR 79 | Ht 61.0 in | Wt 210.0 lb

## 2024-02-26 DIAGNOSIS — G4733 Obstructive sleep apnea (adult) (pediatric): Secondary | ICD-10-CM | POA: Diagnosis not present

## 2024-02-26 NOTE — Patient Instructions (Signed)
 I will see you a year from now  The download from your machine shows that your CPAP is working well, we do not need to make any changes  Continue using your CPAP nightly  Call us  with significant concerns   For oral venting at night-you can try a mouth tape, another choice would be a chinstrap--if you do a general search, you will come upon options available to you

## 2024-02-26 NOTE — Progress Notes (Signed)
 Tabitha [name]    [id number]    [birth year]  Primary Care Physician:Matthews, Velma, DO  Referring Physician: Alvia Velma, DO [address] [facility] [address] [place],  [place] [address]  Chief complaint:    Follow-up for obstructive sleep apnea  HPI:  Continues to use CPAP on a nightly basis No significant concerns  CT in 2023 did show a lung nodule, CT was repeated in 2024 showing a stable nodule - Benign nodule -No follow-up needed  No previous CT scans She has no symptoms No shortness of breath, no chest pain or chest discomfort  Quit smoking over 30 years ago  Tries to stay active  -Having some shoulder trouble  Outpatient Encounter Medications as of 02/26/2024  Medication Sig   ACCU-CHEK GUIDE test strip    Acetaminophen  (TYLENOL  PO) Take by mouth as needed. Not sure the dose   amphetamine -dextroamphetamine  (ADDERALL) 10 MG tablet Take 1 tablet by mouth daily as directed   calcipotriene  (DOVONOX) 0.005 % cream Apply topically 2 (two) times daily. Apply to ears and face until areas are clear   celecoxib  (CELEBREX ) 100 MG capsule Take 1 capsule (100 mg total) by mouth 2 (two) times daily.   clobetasol  (TEMOVATE ) 0.05 % external solution Apply topically 2 (two) times daily   clotrimazole -betamethasone  (LOTRISONE ) cream Apply to affected area(s) on skin 2 (two) times daily.   Continuous Glucose Sensor (FREESTYLE LIBRE 3 PLUS SENSOR) MISC Change sensor every 15 days.   desonide  (DESOWEN ) 0.05 % cream Apply 1 application topically 2 (two) times daily.   Doxepin  HCl 3 MG TABS Take 1-2 tablets (3-6 mg total) by mouth at bedtime as needed.   glucose blood (FREESTYLE LITE) test strip Use to check blood sugar 3 times a day   guselkumab  (TREMFYA  PEN) 100 MG/ML pen Inject 100mg  subcutanously at week 4 then every 8 weeks as directed   guselkumab  (TREMFYA  PEN) 100 MG/ML pen Inject 100mg  subcutaneously at week 0 and week 4 as directed   lisdexamfetamine (VYVANSE ) 60  MG capsule Take 1 capsule (60 mg total) by mouth daily.   metFORMIN  (GLUCOPHAGE -XR) 500 MG 24 hr tablet Take 1 tablet (500 mg total) by mouth every evening.   mupirocin  ointment (BACTROBAN ) 2 % Apply 1 Application topically 2 (two) times daily.   Naltrexone HCl, Pain, 4.5 MG CAPS Take 1 tablet by mouth every morning.   nystatin -triamcinolone  ointment (MYCOLOG) Apply 1 Application topically 2 (two) times daily. Apply for 5 days then STOP   Omega-3 Fatty Acids (FISH OIL) 1200 MG CAPS Take 1,200-2,400 mg by mouth 2 (two) times daily. Takes 2400 mg qam and 1200 mg qpm   tirzepatide  (MOUNJARO ) 5 MG/0.5ML Pen Inject 5 mg into the skin once a week.   No facility-administered encounter medications on file as of 02/26/2024.    Allergies as of 02/26/2024 - Review Complete 02/26/2024  Allergen Reaction Noted   Chocolate Anaphylaxis 05/06/2013   Corn-containing products Nausea Only 07/12/2023   Food  08/16/2013   Gluten meal  07/12/2023   Latex Rash 05/06/2013   Methotrexate  Rash 02/02/2021    Past Medical History:  Diagnosis Date   A-fib [facility])    Alcohol abuse    Allergy 15+ Years ago   Chocolate seasonal fall allergies   Anemia    Arrhythmia May 2020   Arthritis    Asthma    Back pain    Clotting disorder (HCC)    chronic nose bleeds  since childhood   De Quervain's disease (tenosynovitis)    Depression Previous history not currently being treated   Dermatophytosis, scalp    Diabetes mellitus    Drug use    Edema of both lower extremities    Fatty liver    Female infertility    Gallbladder problem    Hypercholesteremia    Joint pain    Obesity    Osteoarthritis    Palpitations    PCOS (polycystic ovarian syndrome)    Polycystic ovarian disease    Psoriatic arthritis (HCC)    Sleep apnea CPAP therapy started in [employer]   SOB (shortness of breath)    Vitamin D  deficiency    Wears glasses     Past Surgical History:  Procedure Laterality Date   BREAST BIOPSY Right    CARPAL  TUNNEL RELEASE     COLONOSCOPY     DE QUERVAIN'S RELEASE  2012   left   KNEE ARTHROPLASTY     KNEE ARTHROSCOPY Right 05/06/2013   Procedure: RIGHT KNEE ARTHROSCOPY ;  Surgeon: Maude KANDICE Herald, MD;  Location: [facility];  Service: Orthopedics;  Laterality: Right;  partial lateral minisectomy and chondroplasty   WISDOM TOOTH EXTRACTION     WRIST SURGERY  2001   carpel tunnel -rt    Family History  Problem Relation Age of Onset   Cancer Mother 74       breast ca died at age 48   Breast cancer Mother 59   Depression Mother    Anxiety disorder Mother    Obesity Mother    Early death Mother    Diabetes Father    Hypertension Father    Stroke Father    Kidney disease Father    Heart disease Father    High Cholesterol Father    Obesity Father    Obesity Brother    Hypertension Brother    Heart disease Maternal Grandmother    Alzheimer's disease Maternal Grandmother    Hyperlipidemia Maternal Grandmother    Diabetes Paternal Grandmother    Obesity Paternal Grandmother    Heart disease Paternal Grandfather    Hypertension Other    Diabetes type II Other    Obesity Other     Social History   Socioeconomic History   Marital status: Married    Spouse name: Carlin   Number of children: Not on file   Years of education: Not on file   Highest education level: Master's degree (e.g., MA, MS, MEng, MEd, MSW, MBA)  Occupational History   Occupation: Academic librarian: [employer]    Comment: at [facility]   Occupation: Nurse, children's, 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   Vaping Use   Vaping status: Never Used  Substance  and Sexual Activity   Alcohol use: Yes    Alcohol/week: 2.0 - 3.0 standard drinks of alcohol    Types: 2 - 3 Shots of liquor per week    Comment: 1-4 drinks/ month   Drug use: Not Currently    Types: Marijuana    Comment:  Rare useage 1-2 x year   Sexual activity: Yes    Partners: Male    Birth control/protection: Post-menopausal, None    Comment: married  Other Topics Concern   Not on file  Social History Narrative   Not on file   Social Drivers of Health   Financial Resource Strain: Low Risk  (02/13/2024)   Overall Financial Resource Strain (CARDIA)    Difficulty of Paying Living Expenses: Not hard at all  Food Insecurity: No Food Insecurity (02/13/2024)   Hunger Vital Sign    Worried About Running Out of Food in the Last Year: Never true    Ran Out of Food in the Last Year: Never true  Transportation Needs: No Transportation Needs (02/13/2024)   PRAPARE - Administrator, Civil Service (Medical): No    Lack of Transportation (Non-Medical): No  Physical Activity: Sufficiently Active (02/13/2024)   Exercise Vital Sign    Days of Exercise per Week: 4 days    Minutes of Exercise per Session: 40 min  Stress: Stress Concern Present (02/13/2024)   [employer] of Occupational Health - Occupational Stress Questionnaire    Feeling of Stress: To some extent  Social Connections: Moderately Isolated (02/13/2024)   Social Connection and Isolation Panel    Frequency of Communication with Friends and Family: More than three times a week    Frequency of Social Gatherings with Friends and Family: Once a week    Attends Religious Services: Never    Database administrator or Organizations: No    Attends Engineer, structural: Not on file    Marital Status: Married  Catering manager Violence: Not on file    Review of Systems  Constitutional:  Negative for fatigue.  Respiratory:  Positive for apnea.   Psychiatric/Behavioral:  Positive for sleep disturbance.     Vitals:    02/26/24 1543  BP: 132/81  Pulse: 79  SpO2: 98%     Physical Exam Constitutional:      Appearance: She is obese.  HENT:     Head: Normocephalic.     Mouth/Throat:     Mouth: Mucous membranes are moist.  Eyes:     Pupils: Pupils are equal, round, and reactive to light.  Cardiovascular:     Rate and Rhythm: Normal rate and regular rhythm.     Heart sounds: No murmur heard.    No friction rub.  Pulmonary:     Effort: No respiratory distress.     Breath sounds: No stridor. No wheezing or rhonchi.  Musculoskeletal:     Cervical back: No rigidity or tenderness.  Neurological:     Mental Status: She is alert.  Psychiatric:        Mood and Affect: Mood normal.    Sleep study reviewed showing moderate obstructive sleep apnea  Compliance data reviewed showing Compliance at 7 hours 50 minutes AutoSet 5-15 Residual AHI of 2.0  Assessment:  Moderate obstructive sleep apnea adequately treated with CPAP therapy  Class III obesity Continues to work on weight loss efforts  Lung nodule Will stable on follow-up - Benign nodule - No follow-up CT needed  Plan/Recommendations: Continue CPAP nightly  For oral venting, a mouth tape can be tried  Follow-up a year from now   Jennet Epley MD [facility] 02/26/2024, 3:51 PM  CC: Alvia Bring, DO

## 2024-02-27 ENCOUNTER — Other Ambulatory Visit (HOSPITAL_COMMUNITY): Payer: Self-pay

## 2024-02-27 ENCOUNTER — Telehealth: Payer: Self-pay

## 2024-02-27 ENCOUNTER — Ambulatory Visit (INDEPENDENT_AMBULATORY_CARE_PROVIDER_SITE_OTHER): Admitting: Internal Medicine

## 2024-02-27 ENCOUNTER — Encounter (INDEPENDENT_AMBULATORY_CARE_PROVIDER_SITE_OTHER): Payer: Self-pay | Admitting: Internal Medicine

## 2024-02-27 VITALS — BP 120/78 | HR 67 | Temp 98.4°F | Ht 61.0 in | Wt 213.0 lb

## 2024-02-27 DIAGNOSIS — E66813 Obesity, class 3: Secondary | ICD-10-CM | POA: Diagnosis not present

## 2024-02-27 DIAGNOSIS — Z7985 Long-term (current) use of injectable non-insulin antidiabetic drugs: Secondary | ICD-10-CM

## 2024-02-27 DIAGNOSIS — K5903 Drug induced constipation: Secondary | ICD-10-CM | POA: Insufficient documentation

## 2024-02-27 DIAGNOSIS — F4322 Adjustment disorder with anxiety: Secondary | ICD-10-CM | POA: Diagnosis not present

## 2024-02-27 DIAGNOSIS — E1169 Type 2 diabetes mellitus with other specified complication: Secondary | ICD-10-CM

## 2024-02-27 DIAGNOSIS — Z6841 Body Mass Index (BMI) 40.0 and over, adult: Secondary | ICD-10-CM | POA: Diagnosis not present

## 2024-02-27 DIAGNOSIS — M75121 Complete rotator cuff tear or rupture of right shoulder, not specified as traumatic: Secondary | ICD-10-CM | POA: Diagnosis not present

## 2024-02-27 DIAGNOSIS — R110 Nausea: Secondary | ICD-10-CM | POA: Insufficient documentation

## 2024-02-27 DIAGNOSIS — G4733 Obstructive sleep apnea (adult) (pediatric): Secondary | ICD-10-CM | POA: Diagnosis not present

## 2024-02-27 MED ORDER — ONDANSETRON 4 MG PO TBDP
4.0000 mg | ORAL_TABLET | Freq: Three times a day (TID) | ORAL | 0 refills | Status: AC | PRN
  Filled 2024-02-27: qty 30, 10d supply, fill #0

## 2024-02-27 MED ORDER — TIRZEPATIDE 7.5 MG/0.5ML ~~LOC~~ SOAJ
7.5000 mg | SUBCUTANEOUS | 0 refills | Status: DC
  Filled 2024-02-27: qty 2, 28d supply, fill #0

## 2024-02-27 NOTE — Assessment & Plan Note (Signed)
 Most recent A1c is 6.0 she is currently on Mounjaro  medication will be increased to 7.5 mg once a week.  Blood pressure is well-controlled.  Continue metformin  at 500 mg once a day.

## 2024-02-27 NOTE — Telephone Encounter (Signed)
   Pre-operative Risk Assessment    Patient Name: Tabitha [name]  DOB: February 16, 1966 MRN: [id number]   Date of last office visit: 05/14/23 FAIRY HEINRICH, PA-C ([facility]), 11/08/22 WILL CAMNITZ, MD Date of next office visit: NONE   Request for Surgical Clearance    Procedure:  RIGHT SHOULDER ARTHROPLASTY ROTATOR CUFF REPAIR, SUBACROMIAL DECOMPRESSION  Date of Surgery:  Clearance 03/11/24                                Surgeon:  JUSTIN W. DOZIER, MD Surgeon's Group or Practice Name:  [facility] Phone number:  [phone] Fax number:  [phone]  ATTN: VINA BONINE   Type of Clearance Requested:   - Medical    Type of Anesthesia:  CHOICE   Additional requests/questions:    SignedLucie DELENA Ku   02/27/2024, 4:36 PM

## 2024-02-27 NOTE — Assessment & Plan Note (Signed)
 Weight management is ongoing with a slight weight gain of three pounds since the last visit. She is following a 1200 calorie nutrition plan 70% of the time, engaging in regular physical activity, and consuming a diet rich in whole foods. There is a noted shift in body composition with a decrease in body fat percentage, indicating a positive change despite the weight gain. The current dose of Mounjaro  may not be sufficient for appetite suppression and weight management. - Increase Mounjaro  to 7.5 mg once a week. - Provide nutritional guidance and encourage tracking of food intake two to three days a week. - Encourage regular physical activity, focusing on lower body workouts and walking, especially post-surgery. - Discuss the importance of muscle activation and its role in metabolism and weight management.

## 2024-02-27 NOTE — Progress Notes (Signed)
 Office: [phone]  /  Fax: [phone]  Weight Summary and Body Composition Analysis (BIA)  Vitals Temp: 98.4 F (36.9 C) BP: 120/78 Pulse Rate: 67 SpO2: 100 %   Anthropometric Measurements Height: 5' 1 (1.549 m) Weight: 213 lb (96.6 kg) BMI (Calculated): 40.27 Weight at Last Visit: 210 lb Weight Lost Since Last Visit: 0 lb Weight Gained Since Last Visit: 3 lb Starting Weight: 218lb Total Weight Loss (lbs): 5 lb (2.268 kg) Peak Weight: 280 lb   Body Composition  Body Fat %: 45.5 % Fat Mass (lbs): 97.2 lbs Muscle Mass (lbs): 110.6 lbs Total Body Water (lbs): 79.6 lbs Visceral Fat Rating : 15    RMR: 1886  Today's Visit #: 10  Starting Date: 07/12/23   Subjective   Chief Complaint: Obesity  Interval History Discussed the use of AI scribe software for clinical note transcription with the patient, who gave verbal consent to proceed.  History of Present Illness Tabitha [name] is a 58 year old female with sleep apnea and type 2 diabetes who presents for medical weight management.  She has gained three pounds since her last visit. She adheres to a 1200 calorie nutrition plan 70% of the time and incorporates more whole foods into her diet. She exercises five days a week for 30 minutes, focusing on strengthening exercises. She reports she has not noticed as much reduction in 'food noise' or the 'stop gap' effect as she experienced with a lower dose of her medication.  She is currently taking Mounjaro  for diabetes and weight management. She experiences constipation, with daily bowel movements that are less substantial than expected. She has tried using Smooth Move tea and fiber tablets to aid with this issue. Her diet includes a large portion of fruits, vegetables, brown rice, and occasionally potatoes, while limiting white carbs. She also uses ginger and peppermint for nausea relief.  She has a history of a torn rotator cuff, with three tendons torn and a  partial tear on the fourth, one of which is retracted. She previously had surgery on her left shoulder two years ago. She worked as a Horticulturist, commercial for 30 years, which involved moving patients, but she attributes the current injury to a bone spur. She is scheduled to see an orthopedic surgeon for further evaluation.  She reports difficulty managing life stressors, with her sleep reduced to about five hours per night. She attributes this partly to shoulder pain and is in a transition phase between medications for psoriatic arthritis, switching from Bimzelx  to Tremfya , which has resulted in poorly managed pain. Her sleep is not disrupted by stress or anxiety, but she becomes fidgety upon lying down.     Challenges affecting patient progress: orthopedic problems, medical conditions or chronic pain affecting mobility, medical comorbidities, and menopause.    Pharmacotherapy for weight management: She is currently taking Metformin  with diabetes as primary indication with adequate clinical response  and without side effects. and Monjauro with diabetes as the primary indication and obesity secondary with adequate clinical response  and without side effects..   Assessment and Plan   Treatment Plan For Obesity:  Recommended Dietary Goals  Tabitha [name] is currently in the action stage of change. As such, her goal is to continue weight management plan. She has agreed to: continue current plan  Behavioral Health and Counseling  We discussed the following behavioral modification strategies today: continue to work on maintaining a reduced calorie state, getting the recommended amount of protein, incorporating  whole foods, making healthy choices, staying well hydrated and practicing mindfulness when eating. and increase protein intake, fibrous foods (25 grams per day for women, 30 grams for men) and water to improve satiety and decrease hunger signals. .  Additional education and resources provided  today: None  Recommended Physical Activity Goals  Tabitha [name] has been advised to work up to 150 minutes of moderate intensity aerobic activity a week and strengthening exercises 2-3 times per week for cardiovascular health, weight loss maintenance and preservation of muscle mass.  She has agreed to :  Work on exercising involving lower body and increasing walking time and distance  Medical Interventions and Pharmacotherapy  We discussed various medication options to help Tabitha [name] with her weight loss efforts and we both agreed to : Increase Mounjaro  to 7.5 mg once a week  Associated Conditions Impacted by Obesity Treatment  Assessment & Plan Type 2 diabetes mellitus with other specified complication, without long-term current use of insulin  (HCC) Most recent A1c is 6.0 she is currently on Mounjaro  medication will be increased to 7.5 mg once a week.  Blood pressure is well-controlled.  Continue metformin  at 500 mg once a day. OSA (obstructive sleep apnea) On CPAP with reported good compliance. Continue PAP therapy. Losing 15% or more of body weight may improve AHI.  She benefits from GLP-1 aided weight management. Class 3 severe obesity with serious comorbidity and body mass index (BMI) of 40.0 to 44.9 in adult Weight management is ongoing with a slight weight gain of three pounds since the last visit. She is following a 1200 calorie nutrition plan 70% of the time, engaging in regular physical activity, and consuming a diet rich in whole foods. There is a noted shift in body composition with a decrease in body fat percentage, indicating a positive change despite the weight gain. The current dose of Mounjaro  may not be sufficient for appetite suppression and weight management. - Increase Mounjaro  to 7.5 mg once a week. - Provide nutritional guidance and encourage tracking of food intake two to three days a week. - Encourage regular physical activity, focusing on lower body workouts and walking,  especially post-surgery. - Discuss the importance of muscle activation and its role in metabolism and weight management. Nausea Prescribed ondansetron  4 mg every 8 hours as needed in case she develops nausea pertaining to GLP-1 Drug-induced constipation She is on several constipating medications.  High-protein diets may also contribute to this.Constipation is present, likely related to GLP-1 therapy. She reports daily bowel movements, though they are firmer and drier than expected. She is using fiber supplements and Smooth Move tea but may benefit from a more structured bowel regimen. - Recommend a bowel regimen with laxatives such as Docolax and Miralax, up to two servings per day, to maintain regular bowel movements. - Ensure dietary fiber intake of at least 25 grams per day. - Provide education on managing constipation and the potential side effects of GLP-1 therapy.         Objective   Physical Exam:  Blood pressure 120/78, pulse 67, temperature 98.4 F (36.9 C), height 5' 1 (1.549 m), weight 213 lb (96.6 kg), SpO2 100%. Body mass index is 40.25 kg/m.  General: She is overweight, cooperative, alert, well developed, and in no acute distress. PSYCH: Has normal mood, affect and thought process.   HEENT: EOMI, sclerae are anicteric. Lungs: Normal breathing effort, no conversational dyspnea. Extremities: No edema.  Neurologic: No gross sensory or motor deficits. No tremors or fasciculations  noted.    Diagnostic Data Reviewed:  BMET    Component Value Date/Time   NA 139 07/12/2023 0000   K 4.5 07/12/2023 0000   CL 100 07/12/2023 0000   CO2 22 07/12/2023 0000   GLUCOSE 140 (H) 04/04/2022 0000   BUN 15 07/12/2023 0000   CREATININE 0.6 07/12/2023 0000   CREATININE 0.72 04/04/2022 0000   CALCIUM 9.6 07/12/2023 0000   GFRNONAA 98 08/21/2019 0834   GFRAA 114 08/21/2019 0834   Lab Results  Component Value Date   HGBA1C 6.0 (A) 12/19/2023   HGBA1C 7.2 09/06/2011   Lab  Results  Component Value Date   INSULIN  19.3 07/25/2023   Lab Results  Component Value Date   TSH 1.23 09/15/2022   CBC    Component Value Date/Time   WBC 6.6 07/12/2023 0000   WBC 7.9 04/04/2022 0000   RBC 5.09 07/12/2023 0000   HGB 15.3 07/12/2023 0000   HCT 46 07/12/2023 0000   PLT 183 07/12/2023 0000   MCV 91.1 04/04/2022 0000   MCH 30.6 04/04/2022 0000   MCHC 33.5 04/04/2022 0000   RDW 11.8 04/04/2022 0000   Iron Studies    Component Value Date/Time   FERRITIN 71 09/15/2022 0000   Lipid Panel     Component Value Date/Time   CHOL 183 07/25/2023 0754   TRIG 119 07/25/2023 0754   HDL 54 07/25/2023 0754   CHOLHDL 2.4 04/04/2022 0000   VLDL 25.0 09/25/2019 0920   LDLCALC 108 (H) 07/25/2023 0754   LDLCALC 87 04/04/2022 0000   LDLDIRECT 149 (H) 12/06/2011 1702   Hepatic Function Panel     Component Value Date/Time   PROT 7.0 04/04/2022 0000   ALBUMIN 4.5 07/12/2023 0000   AST 23 07/12/2023 0000   ALT 33 07/12/2023 0000   ALKPHOS 99 07/12/2023 0000   BILITOT 0.5 04/04/2022 0000      Component Value Date/Time   TSH 1.23 09/15/2022 0000   TSH 1.02 12/11/2018 1102   Nutritional Lab Results  Component Value Date   VD25OH 57.2 07/25/2023   VD25OH 38.8 09/15/2022    Medications: Outpatient Encounter Medications as of 02/27/2024  Medication Sig Note   ACCU-CHEK GUIDE test strip  11/26/2020: use   Acetaminophen  (TYLENOL  PO) Take by mouth as needed. Not sure the dose    amphetamine -dextroamphetamine  (ADDERALL) 10 MG tablet Take 1 tablet by mouth daily as directed    calcipotriene  (DOVONOX) 0.005 % cream Apply topically 2 (two) times daily. Apply to ears and face until areas are clear    celecoxib  (CELEBREX ) 100 MG capsule Take 1 capsule (100 mg total) by mouth 2 (two) times daily.    clobetasol  (TEMOVATE ) 0.05 % external solution Apply topically 2 (two) times daily    clotrimazole -betamethasone  (LOTRISONE ) cream Apply to affected area(s) on skin 2 (two) times  daily.    Continuous Glucose Sensor (FREESTYLE LIBRE 3 PLUS SENSOR) MISC Change sensor every 15 days.    desonide  (DESOWEN ) 0.05 % cream Apply 1 application topically 2 (two) times daily.    glucose blood (FREESTYLE LITE) test strip Use to check blood sugar 3 times a day    guselkumab  (TREMFYA  PEN) 100 MG/ML pen Inject 100mg  subcutanously at week 4 then every 8 weeks as directed    guselkumab  (TREMFYA  PEN) 100 MG/ML pen Inject 100mg  subcutaneously at week 0 and week 4 as directed    lisdexamfetamine (VYVANSE ) 60 MG capsule Take 1 capsule (60 mg total) by mouth daily.  metFORMIN  (GLUCOPHAGE -XR) 500 MG 24 hr tablet Take 1 tablet (500 mg total) by mouth every evening.    mupirocin  ointment (BACTROBAN ) 2 % Apply 1 Application topically 2 (two) times daily.    Naltrexone HCl, Pain, 4.5 MG CAPS Take 1 tablet by mouth every morning.    nystatin -triamcinolone  ointment (MYCOLOG) Apply 1 Application topically 2 (two) times daily. Apply for 5 days then STOP    Omega-3 Fatty Acids (FISH OIL) 1200 MG CAPS Take 1,200-2,400 mg by mouth 2 (two) times daily. Takes 2400 mg qam and 1200 mg qpm    ondansetron  (ZOFRAN -ODT) 4 MG disintegrating tablet Take 1 tablet (4 mg total) by mouth every 8 (eight) hours as needed.    tirzepatide  (MOUNJARO ) 7.5 MG/0.5ML Pen Inject 7.5 mg into the skin once a week.    [DISCONTINUED] tirzepatide  (MOUNJARO ) 5 MG/0.5ML Pen Inject 5 mg into the skin once a week.    Doxepin  HCl 3 MG TABS Take 1-2 tablets (3-6 mg total) by mouth at bedtime as needed.    No facility-administered encounter medications on file as of 02/27/2024.     Follow-Up   Return in about 4 weeks (around 03/26/2024) for For Weight Mangement with Dr. Francyne.SABRA She was informed of the importance of frequent follow up visits to maximize her success with intensive lifestyle modifications for her multiple health conditions.  Attestation Statement   Reviewed by clinician on day of visit: allergies, medications,  problem list, medical history, surgical history, family history, social history, and previous encounter notes.     Lucas Francyne, MD

## 2024-02-27 NOTE — Assessment & Plan Note (Signed)
 She is on several constipating medications.  High-protein diets may also contribute to this.Constipation is present, likely related to GLP-1 therapy. She reports daily bowel movements, though they are firmer and drier than expected. She is using fiber supplements and Smooth Move tea but may benefit from a more structured bowel regimen. - Recommend a bowel regimen with laxatives such as Docolax and Miralax, up to two servings per day, to maintain regular bowel movements. - Ensure dietary fiber intake of at least 25 grams per day. - Provide education on managing constipation and the potential side effects of GLP-1 therapy.

## 2024-02-27 NOTE — Assessment & Plan Note (Signed)
 On CPAP with reported good compliance. Continue PAP therapy. Losing 15% or more of body weight may improve AHI.  She benefits from GLP-1 aided weight management.

## 2024-02-27 NOTE — Assessment & Plan Note (Signed)
 Prescribed ondansetron  4 mg every 8 hours as needed in case she develops nausea pertaining to GLP-1

## 2024-02-28 ENCOUNTER — Other Ambulatory Visit: Payer: Self-pay | Admitting: Pharmacy Technician

## 2024-02-28 ENCOUNTER — Encounter (INDEPENDENT_AMBULATORY_CARE_PROVIDER_SITE_OTHER): Payer: Self-pay

## 2024-02-28 ENCOUNTER — Other Ambulatory Visit: Payer: Self-pay

## 2024-02-28 ENCOUNTER — Other Ambulatory Visit (HOSPITAL_COMMUNITY): Payer: Self-pay

## 2024-02-28 ENCOUNTER — Telehealth: Payer: Self-pay

## 2024-02-28 MED ORDER — DOXEPIN HCL 10 MG PO CAPS
10.0000 mg | ORAL_CAPSULE | Freq: Every evening | ORAL | 1 refills | Status: AC | PRN
  Filled 2024-02-28: qty 90, 90d supply, fill #0

## 2024-02-28 NOTE — Telephone Encounter (Signed)
 Received surgical assessment form from [facility]. Right shoulder arthroscopic rotator cuff repair scheduled for 03/11/2024. Patient last seen 02/26/2024.  AO, please advise. Thanks

## 2024-02-28 NOTE — Progress Notes (Signed)
 Specialty Pharmacy Refill Coordination Note  Tabitha [name] is a 58 y.o. female contacted today regarding refills of specialty medication(s) Guselkumab  (Tremfya  Pen)   Patient requested [name] at [facility] [place] date: 03/05/24   Medication will be filled on 03/04/24.  Per chart in [facility]: 8/27 start date at week 0 then 9/27 at week 4 and 8 weeks thereafter Injection date on 03/08/24.  Answered questionnaire.

## 2024-02-28 NOTE — Telephone Encounter (Signed)
   Name: Tabitha [name]  DOB: 1965-12-29  MRN: [id number]  Primary Cardiologist: None  Chart reviewed as part of pre-operative protocol coverage. Because of Nelissa Bolduc Peer's past medical history and time since last visit, she will require a follow-up in-office visit in order to better assess preoperative cardiovascular risk.  Pre-op covering staff: - Please schedule appointment and call patient to inform them. If patient already had an upcoming appointment within acceptable timeframe, please add pre-op clearance to the appointment notes so provider is aware. - Please contact requesting surgeon's office via preferred method (i.e, phone, fax) to inform them of need for appointment prior to surgery.  No medications indicated as needing held.   Orren LOISE Fabry, PA-C  02/28/2024, 8:46 AM

## 2024-02-28 NOTE — Telephone Encounter (Signed)
 Staff message was sent to our EP scheduling team to help schedule patient for clearance

## 2024-02-29 NOTE — Telephone Encounter (Signed)
 Attempted to call patient x1, to schedule w/ EP APP for 6 month follow up + pre-op clearance for 9/30 surgery; no answer... [email or web address], appointment made for 9/23 w/ Aniceto, NP, text sent via 9/23 appointment made for patient and MyChart message sent to patient.   Text sent to patient: Ellouise, this is Dr. Carolyn office. You have an appt w/ Daphne Aniceto, NP on 9/23 at 8am for clearance and overdue 6 month follow up. Here is our address - [address] floor, [place]. Confirm @ [phone]. Thanks!

## 2024-03-02 NOTE — Progress Notes (Unsigned)
 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   Since last being seen in our clinic the patient reports she has not had significant burden of AF.  She might have episodes of AF 1-2 x per month or go 6 weeks with none. Episodes lasting less than 5 minutes and longest lasting 2h. She wears an Scientist, physiological and has <2% burden on device.  He has been taking magnesium supplementation and she feels this has helped. 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    In terms of pre-op clearance, she denies new shortness of breath or chest pain. She is limited by her shoulder issues but otherwise does activity as tolerated.   She denies chest pain, palpitations, dyspnea, PND, orthopnea, nausea, vomiting, dizziness, syncope, edema, weight gain, or early satiety.   Review of systems complete and found to be negative unless listed in HPI.   EP Information / Studies Reviewed:    EKG is ordered today. Personal review as below.  EKG Interpretation Date/Time:  Tuesday March 04 2024 08:06:16 EDT Ventricular Rate:  65 PR Interval:  154 QRS Duration:  70 QT Interval:  408 QTC Calculation: 424 R Axis:   22  Text Interpretation: Normal sinus rhythm Confirmed by Aniceto Jarvis ([id number]) on 03/04/2024 8:10:29 AM    Arrhythmia / AAD / Pertinent EP Studies AF  Metoprolol  > previously did not tolerate / tired & dizzy Eliquis  > previously on short term before seeing EP, had significant nosebleeds on OAC  Risk Assessment/Calculations:    CHA2DS2-VASc Score = 2   This indicates a 2.2% annual risk of stroke. The patient's  score is based upon: CHF History: 0 HTN History: 0 Diabetes History: 1 Stroke History: 0 Vascular Disease History: 0 Age Score: 0 Gender Score: 1              Physical Exam:   VS:  BP 127/80   Pulse 65   Ht 5' 2 (1.575 m)   Wt 215 lb (97.5 kg)   SpO2 99%   BMI 39.32 kg/m    Wt Readings from Last 3 Encounters:  03/04/24 215 lb (97.5 kg)  02/27/24 213 lb (96.6 kg)  02/26/24 210 lb (95.3 kg)     GEN: Well nourished, well developed in no acute distress NECK: No JVD; No carotid bruits CARDIAC: Regular rate and rhythm, no murmurs, rubs, gallops RESPIRATORY:  Clear to auscultation without rales, wheezing or rhonchi  ABDOMEN: Soft, non-tender, non-distended EXTREMITIES:  No edema; No deformity   ASSESSMENT AND PLAN:    Paroxysmal Atrial Fibrillation  CHA2DS2-VASc 2 -EKG with NSR    -not on OAC due to low risk score  -low subjective burden > discussed in detail if becomes increased can consider ablation or AAD  -pt instructed to call if more frequent or longer duration of AF  -reviewed modifiable risk factors for AF > exercise, 10% weight reduction, treating sleep apena and avoidance of excessive ETOH   OSA  -CPAP compliance encouraged    Pre-Procedure Clearance  Surgery: Right shoulder arthroplasty rotator cuff repair  Date of Surgery: 03/11/24  Surgeon:  JUSTIN W. DOZIER, MD Surgeon's Group or Practice Name:  [facility] Phone number:  [phone] Fax number:  [phone]  ATTN: VINA BONINE Type of Clearance Requested:  Medical  Type of Anesthesia:  CHOICE     Ms. Tonkovich's perioperative risk of a major cardiac event is 0.4% according to the Revised Cardiac Risk Index (RCRI).  Therefore, she is at low risk for perioperative complications an may proceed with no further testing.     Recommendations: According to ACC/AHA guidelines, no further cardiovascular testing needed.  The patient may proceed to surgery at acceptable risk.     Antiplatelet and/or Anticoagulation Recommendations: N/A    Follow up with Dr. Inocencio in 12 months or sooner if new needs arise.   Signed, Daphne Barrack, NP-C, AGACNP-BC [facility] - Electrophysiology  03/04/2024, 8:11 AM

## 2024-03-04 ENCOUNTER — Other Ambulatory Visit: Payer: Self-pay | Admitting: Pharmacy Technician

## 2024-03-04 ENCOUNTER — Other Ambulatory Visit: Payer: Self-pay

## 2024-03-04 ENCOUNTER — Encounter: Payer: Self-pay | Admitting: Pulmonary Disease

## 2024-03-04 ENCOUNTER — Telehealth: Payer: Self-pay

## 2024-03-04 ENCOUNTER — Ambulatory Visit: Attending: Pulmonary Disease | Admitting: Pulmonary Disease

## 2024-03-04 VITALS — BP 127/80 | HR 65 | Ht 62.0 in | Wt 215.0 lb

## 2024-03-04 DIAGNOSIS — G4733 Obstructive sleep apnea (adult) (pediatric): Secondary | ICD-10-CM

## 2024-03-04 DIAGNOSIS — I480 Paroxysmal atrial fibrillation: Secondary | ICD-10-CM

## 2024-03-04 DIAGNOSIS — Z992 Dependence on renal dialysis: Secondary | ICD-10-CM | POA: Diagnosis not present

## 2024-03-04 NOTE — Patient Instructions (Signed)
 Medication Instructions:  Your physician recommends that you continue on your current medications as directed. Please refer to the Current Medication list given to you today.  *If you need a refill on your cardiac medications before your next appointment, please call your pharmacy*  Lab Work: None ordered If you have labs (blood work) drawn today and your tests are completely normal, you will receive your results only by: MyChart Message (if you have MyChart) OR A paper copy in the mail If you have any lab test that is abnormal or we need to change your treatment, we will call you to review the results.  Follow-Up: At [facility], you and your health needs are our priority.  As part of our continuing mission to provide you with exceptional heart care, our providers are all part of one team.  This team includes your primary Cardiologist (physician) and Advanced Practice Providers or APPs (Physician Assistants and Nurse Practitioners) who all work together to provide you with the care you need, when you need it.  Your next appointment:   1 year(s)  Provider:   You may see Will Gladis Norton, MD or the following Advanced Practice Provider on your designated Care Team:    Daphne Barrack, NP

## 2024-03-04 NOTE — Telephone Encounter (Signed)
 Pharmacy Patient Advocate Encounter   Received notification from Patient Pharmacy that prior authorization for Tremfya  is required/requested.   Insurance verification completed.   The patient is insured through [facility] .   Per test claim: PA required; PA submitted to above mentioned insurance via Latent Key/confirmation #/EOC [employer] Status is pending

## 2024-03-04 NOTE — Progress Notes (Signed)
 PA pending

## 2024-03-05 ENCOUNTER — Other Ambulatory Visit: Payer: Self-pay

## 2024-03-05 NOTE — Telephone Encounter (Signed)
 Pharmacy Patient Advocate Encounter  Received notification from [facility] that Prior Authorization for Tremfya  has been APPROVED from 02/24/24 to 07/23/24   PA #/Case ID/Reference #: [id number]

## 2024-03-06 NOTE — Telephone Encounter (Signed)
Clearance has been faxed to requesting office.  

## 2024-03-06 NOTE — Telephone Encounter (Signed)
 Preoperative team,  Patient was recently seen in clinic on 03/04/2024.  She was felt to be acceptable risk to proceed with upcoming planned surgery.  Please forward appointment note to requesting office.  Thank you for your help.  Josefa HERO. Alexsa Flaum NP-C     03/06/2024, 2:48 PM [facility] [address] [place], [place] [address] Office [phone]

## 2024-03-06 NOTE — Telephone Encounter (Signed)
 Patient was seen on 03/04/24. Status update please.

## 2024-03-06 NOTE — Progress Notes (Signed)
 OV note faxed to surgeons office as requested.

## 2024-03-07 NOTE — Telephone Encounter (Signed)
 Cleared for surgery from a pulmonary perspective  Low risk for pulmonary complications  Patient has obstructive sleep apnea that is adequately controlled with CPAP therapy

## 2024-03-10 NOTE — Telephone Encounter (Signed)
 Copy of this encounter as well as lov note 02/26/24 faxed to [facility].

## 2024-03-12 DIAGNOSIS — F4322 Adjustment disorder with anxiety: Secondary | ICD-10-CM | POA: Diagnosis not present

## 2024-03-13 NOTE — Progress Notes (Signed)
Sent message, via epic in basket, requesting order in epic from surgeon  

## 2024-03-17 ENCOUNTER — Other Ambulatory Visit (HOSPITAL_COMMUNITY): Payer: Self-pay

## 2024-03-17 MED ORDER — LISDEXAMFETAMINE DIMESYLATE 60 MG PO CAPS
60.0000 mg | ORAL_CAPSULE | Freq: Every day | ORAL | 0 refills | Status: DC
  Filled 2024-03-17: qty 30, 30d supply, fill #0

## 2024-03-17 NOTE — Progress Notes (Signed)
 Anesthesia Review:  PCP: Moldonado LVO 02/27/24  Cardiologist : Daphne aniceto PIETY 03/04/24- LOV   PPM/ ICD: Device Orders: Rep Notified:  Chest x-ray : EKG : 03/04/24 CT Card-2023  Echo : 08/29/22  Monitor-2020  Stress test: Cardiac Cath :   Activity level:  Sleep Study/ CPAP : Fasting Blood Sugar :      / Checks Blood Sugar -- times a day:     DM- type Hgba1c-    Blood Thinner/ Instructions /Last Dose: ASA / Instructions/ Last Dose :

## 2024-03-17 NOTE — Progress Notes (Signed)
 Second request for pre op orders spoke with Vina.

## 2024-03-18 ENCOUNTER — Other Ambulatory Visit: Payer: Self-pay

## 2024-03-18 NOTE — Patient Instructions (Addendum)
 SURGICAL WAITING ROOM VISITATION  Patients having surgery or a procedure may have no more than 2 support people in the waiting area - these visitors may rotate.    Children under the age of 57 must have an adult with them who is not the patient.  Visitors with respiratory illnesses are discouraged from visiting and should remain at home.  If the patient needs to stay at the hospital during part of their recovery, the visitor guidelines for inpatient rooms apply. Pre-op nurse will coordinate an appropriate time for 1 support person to accompany patient in pre-op.  This support person may not rotate.    Please refer to the [facility] website for the visitor guidelines for Inpatients (after your surgery is over and you are in a regular room).       Your procedure is scheduled on:  03/27/2024    Report to [facility] Main Entrance    Report to admitting at   0730AM   Call this number if you have problems the morning of surgery [phone]   Do not eat food :After Midnight.   After Midnight you may have the following liquids until __ 0615____ AM/ PM DAY OF SURGERY  Water Non-Citrus Juices (without pulp, NO RED-Apple, White grape, White cranberry) Black Coffee (NO MILK/CREAM OR CREAMERS, sugar ok)  Clear Tea (NO MILK/CREAM OR CREAMERS, sugar ok) regular and decaf                             Plain Jell-O (NO RED)                                           Fruit ices (not with fruit pulp, NO RED)                                     Popsicles (NO RED)                                                               Sports drinks like Gatorade (NO RED)                 .          If you have questions, please contact your surgeon's office.       Oral Hygiene is also important to reduce your risk of infection.                                    Remember - BRUSH YOUR TEETH THE MORNING OF SURGERY WITH YOUR REGULAR TOOTHPASTE  DENTURES WILL BE REMOVED PRIOR TO SURGERY PLEASE DO  NOT APPLY Poly grip OR ADHESIVES!!!   Do NOT smoke after Midnight   Stop all vitamins and herbal supplements 7 days before surgery.   Take these medicines the morning of surgery with A SIP OF WATER:  none               Metformin - none am of surgery  Mounjaro - last dose on   DO NOT TAKE ANY ORAL DIABETIC MEDICATIONS DAY OF YOUR SURGERY  Bring CPAP mask and tubing day of surgery.                              You may not have any metal on your body including hair pins, jewelry, and body piercing             Do not wear make-up, lotions, powders, perfumes/cologne, or deodorant  Do not wear nail polish including gel and S&S, artificial/acrylic nails, or any other type of covering on natural nails including finger and toenails. If you have artificial nails, gel coating, etc. that needs to be removed by a nail salon please have this removed prior to surgery or surgery may need to be canceled/ delayed if the surgeon/ anesthesia feels like they are unable to be safely monitored.   Do not shave  48 hours prior to surgery.               Men may shave face and neck.   Do not bring valuables to the hospital. [facility] IS NOT             RESPONSIBLE   FOR VALUABLES.   Contacts, glasses, dentures or bridgework may not be worn into surgery.   Bring small overnight bag day of surgery.   DO NOT BRING YOUR HOME MEDICATIONS TO THE HOSPITAL. PHARMACY WILL DISPENSE MEDICATIONS LISTED ON YOUR MEDICATION LIST TO YOU DURING YOUR ADMISSION IN THE HOSPITAL!    Patients discharged on the day of surgery will not be allowed to drive home.  Someone NEEDS to stay with you for the first 24 hours after anesthesia.   Special Instructions: Bring a copy of your healthcare power of attorney and living will documents the day of surgery if you haven't scanned them before.              Please read over the following fact sheets you were given: IF YOU HAVE QUESTIONS ABOUT YOUR PRE-OP INSTRUCTIONS PLEASE  CALL [phone].   If you received a COVID test during your pre-op visit  it is requested that you wear a mask when out in public, stay away from anyone that may not be feeling well and notify your surgeon if you develop symptoms. If you test positive for Covid or have been in contact with anyone that has tested positive in the last 10 days please notify you surgeon.    [facility] - Preparing for Surgery Before surgery, you can play an important role.  Because skin is not sterile, your skin needs to be as free of germs as possible.  You can reduce the number of germs on your skin by washing with CHG (chlorahexidine gluconate) soap before surgery.  CHG is an antiseptic cleaner which kills germs and bonds with the skin to continue killing germs even after washing. Please DO NOT use if you have an allergy to CHG or antibacterial soaps.  If your skin becomes reddened/irritated stop using the CHG and inform your nurse when you arrive at Short Stay. Do not shave (including legs and underarms) for at least 48 hours prior to the first CHG shower.  You may shave your face/neck. Please follow these instructions carefully:  1.  Shower with CHG Soap the night before surgery and the  morning of Surgery.  2.  If you choose to wash your hair,  wash your hair first as usual with your  normal  shampoo.  3.  After you shampoo, rinse your hair and body thoroughly to remove the  shampoo.                           4.  Use CHG as you would any other liquid soap.  You can apply chg directly  to the skin and wash                       Gently with a scrungie or clean washcloth.  5.  Apply the CHG Soap to your body ONLY FROM THE NECK DOWN.   Do not use on face/ open                           Wound or open sores. Avoid contact with eyes, ears mouth and genitals (private parts).                       Wash face,  Genitals (private parts) with your normal soap.             6.  Wash thoroughly, paying special attention to the area  where your surgery  will be performed.  7.  Thoroughly rinse your body with warm water from the neck down.  8.  DO NOT shower/wash with your normal soap after using and rinsing off  the CHG Soap.                9.  Pat yourself dry with a clean towel.            10.  Wear clean pajamas.            11.  Place clean sheets on your bed the night of your first shower and do not  sleep with pets. Day of Surgery : Do not apply any lotions/deodorants the morning of surgery.  Please wear clean clothes to the hospital/surgery center.  FAILURE TO FOLLOW THESE INSTRUCTIONS MAY RESULT IN THE CANCELLATION OF YOUR SURGERY PATIENT SIGNATURE_________________________________  NURSE SIGNATURE__________________________________  ________________________________________________________________________

## 2024-03-19 ENCOUNTER — Other Ambulatory Visit (HOSPITAL_COMMUNITY): Payer: Self-pay

## 2024-03-19 ENCOUNTER — Encounter (HOSPITAL_COMMUNITY)
Admission: RE | Admit: 2024-03-19 | Discharge: 2024-03-19 | Disposition: A | Discharge status: Home / Self Care | Source: Ambulatory Visit | Attending: Orthopedic Surgery | Admitting: Orthopedic Surgery

## 2024-03-19 ENCOUNTER — Encounter (HOSPITAL_COMMUNITY): Payer: Self-pay

## 2024-03-19 ENCOUNTER — Other Ambulatory Visit: Payer: Self-pay

## 2024-03-19 VITALS — BP 145/79 | HR 72 | Temp 98.6°F | Resp 16 | Ht 62.0 in | Wt 211.0 lb

## 2024-03-19 DIAGNOSIS — M75101 Unspecified rotator cuff tear or rupture of right shoulder, not specified as traumatic: Secondary | ICD-10-CM | POA: Insufficient documentation

## 2024-03-19 DIAGNOSIS — Z1151 Encounter for screening for human papillomavirus (HPV): Secondary | ICD-10-CM | POA: Diagnosis not present

## 2024-03-19 DIAGNOSIS — I4891 Unspecified atrial fibrillation: Secondary | ICD-10-CM | POA: Insufficient documentation

## 2024-03-19 DIAGNOSIS — G4733 Obstructive sleep apnea (adult) (pediatric): Secondary | ICD-10-CM | POA: Insufficient documentation

## 2024-03-19 DIAGNOSIS — Z130 Encounter for screening for diseases of the blood and blood-forming organs and certain disorders involving the immune mechanism: Secondary | ICD-10-CM | POA: Diagnosis not present

## 2024-03-19 DIAGNOSIS — Z124 Encounter for screening for malignant neoplasm of cervix: Secondary | ICD-10-CM | POA: Diagnosis not present

## 2024-03-19 DIAGNOSIS — Z01812 Encounter for preprocedural laboratory examination: Secondary | ICD-10-CM | POA: Insufficient documentation

## 2024-03-19 DIAGNOSIS — Z1231 Encounter for screening mammogram for malignant neoplasm of breast: Secondary | ICD-10-CM | POA: Diagnosis not present

## 2024-03-19 DIAGNOSIS — Z87891 Personal history of nicotine dependence: Secondary | ICD-10-CM | POA: Diagnosis not present

## 2024-03-19 DIAGNOSIS — Z1389 Encounter for screening for other disorder: Secondary | ICD-10-CM | POA: Diagnosis not present

## 2024-03-19 DIAGNOSIS — I498 Other specified cardiac arrhythmias: Secondary | ICD-10-CM | POA: Insufficient documentation

## 2024-03-19 DIAGNOSIS — Z79899 Other long term (current) drug therapy: Secondary | ICD-10-CM | POA: Diagnosis not present

## 2024-03-19 DIAGNOSIS — Z1272 Encounter for screening for malignant neoplasm of vagina: Secondary | ICD-10-CM | POA: Diagnosis not present

## 2024-03-19 DIAGNOSIS — I10 Essential (primary) hypertension: Secondary | ICD-10-CM | POA: Insufficient documentation

## 2024-03-19 DIAGNOSIS — Z01818 Encounter for other preprocedural examination: Secondary | ICD-10-CM

## 2024-03-19 DIAGNOSIS — Z01419 Encounter for gynecological examination (general) (routine) without abnormal findings: Secondary | ICD-10-CM | POA: Diagnosis not present

## 2024-03-19 DIAGNOSIS — N952 Postmenopausal atrophic vaginitis: Secondary | ICD-10-CM | POA: Diagnosis not present

## 2024-03-19 DIAGNOSIS — Z0142 Encounter for cervical smear to confirm findings of recent normal smear following initial abnormal smear: Secondary | ICD-10-CM | POA: Diagnosis not present

## 2024-03-19 HISTORY — DX: Pneumonia, unspecified organism: J18.9

## 2024-03-19 HISTORY — DX: Other specified postprocedural states: Z98.890

## 2024-03-19 HISTORY — DX: Anxiety disorder, unspecified: F41.9

## 2024-03-19 HISTORY — DX: Cardiac arrhythmia, unspecified: I49.9

## 2024-03-19 HISTORY — DX: Nausea with vomiting, unspecified: R11.2

## 2024-03-19 LAB — CBC
HCT: 44.3 % (ref 36.0–46.0)
Hemoglobin: 14.2 g/dL (ref 12.0–15.0)
MCH: 29.1 pg (ref 26.0–34.0)
MCHC: 32.1 g/dL (ref 30.0–36.0)
MCV: 90.8 fL (ref 80.0–100.0)
Platelets: 187 K/uL (ref 150–400)
RBC: 4.88 MIL/uL (ref 3.87–5.11)
RDW: 12.3 % (ref 11.5–15.5)
WBC: 8.3 K/uL (ref 4.0–10.5)
nRBC: 0 % (ref 0.0–0.2)

## 2024-03-19 LAB — BASIC METABOLIC PANEL WITH GFR
Anion gap: 13 (ref 5–15)
BUN: 25 mg/dL — ABNORMAL HIGH (ref 6–20)
CO2: 22 mmol/L (ref 22–32)
Calcium: 9.7 mg/dL (ref 8.9–10.3)
Chloride: 104 mmol/L (ref 98–111)
Creatinine, Ser: 0.8 mg/dL (ref 0.44–1.00)
GFR, Estimated: >60 mL/min (ref >60)
Glucose, Bld: 174 mg/dL — ABNORMAL HIGH (ref 70–99)
Potassium: 4.3 mmol/L (ref 3.5–5.1)
Sodium: 138 mmol/L (ref 135–145)

## 2024-03-19 LAB — GLUCOSE, CAPILLARY: Glucose-Capillary: 168 mg/dL — ABNORMAL HIGH (ref 70–99)

## 2024-03-19 LAB — HEMOGLOBIN A1C
Hgb A1c MFr Bld: 5.6 % (ref 4.8–5.6)
Mean Plasma Glucose: 114.02 mg/dL

## 2024-03-19 MED ORDER — ESTRADIOL 0.1 MG/GM VA CREA
TOPICAL_CREAM | VAGINAL | 11 refills | Status: AC
  Filled 2024-03-19: qty 42.5, 84d supply, fill #0

## 2024-03-19 NOTE — Progress Notes (Signed)
 PT had preop on 03/19/2024.  Surgery on 03/27/2024.  Needds orders in preop.  Thank You.

## 2024-03-20 ENCOUNTER — Encounter: Payer: Self-pay | Admitting: Family Medicine

## 2024-03-20 NOTE — Progress Notes (Signed)
 Anesthesia Chart Review   Case: [id number] Date/Time: 03/27/24 0914   Procedures:      ARTHROSCOPY, SHOULDER, WITH ROTATOR CUFF REPAIR (Right)     DECOMPRESSION, SUBACROMIAL SPACE (Right)   Anesthesia type: Choice   Diagnosis: Tear of right rotator cuff, unspecified tear extent, unspecified whether traumatic [M75.101]   Pre-op diagnosis: RIGHT SHOULDER ROTATOR CUFF TEAR   Location: [facility] ROOM 07 / [facility]   Surgeons: Dozier Soulier, MD       DISCUSSION:58 y.o. former smoker with h/o PONV, sleep apnea with cpap, a-fib, right shoulder rotator cuff tear scheduled for above procedure 03/27/2024 with Dr. Soulier Dozier.   Per cardiology preoperative evaluation 03/04/24, Ms. Buitron's perioperative risk of a major cardiac event is 0.4% according to the Revised Cardiac Risk Index (RCRI).  Therefore, she is at low risk for perioperative complications an may proceed with no further testing.      Recommendations: According to ACC/AHA guidelines, no further cardiovascular testing needed.  The patient may proceed to surgery at acceptable risk.    Per pulmonology note, Cleared for surgery from a pulmonary perspective Low risk for pulmonary complications Patient has obstructive sleep apnea that is adequately controlled with CPAP therapy  Pt instructed to hold Mounjaro  1 week prior to surgery.   VS: BP (!) 145/79   Pulse 72   Temp 37 C (Oral)   Resp 16   Ht 5' 2 (1.575 m)   Wt 95.7 kg   SpO2 98%   BMI 38.59 kg/m   PROVIDERS: Alvia Bring, DO is PCP   Electrophysiologist: Will Gladis Norton, MD  LABS: Labs reviewed: Acceptable for surgery. (all labs ordered are listed, but only abnormal results are displayed)  Labs Reviewed  BASIC METABOLIC PANEL WITH GFR - Abnormal; Notable for the following components:      Result Value   Glucose, Bld 174 (*)    BUN 25 (*)    All other components within normal limits  GLUCOSE, CAPILLARY - Abnormal; Notable for the following components:    Glucose-Capillary 168 (*)    All other components within normal limits  HEMOGLOBIN A1C  CBC     IMAGES:   EKG:   CV: Echo 08/29/2022 1. Left ventricular ejection fraction by 3D volume is 62 %. The left  ventricle has normal function. The left ventricle has no regional wall  motion abnormalities. Left ventricular diastolic parameters are consistent  with Grade I diastolic dysfunction  (impaired relaxation).   2. Right ventricular systolic function is normal. The right ventricular  size is normal. Tricuspid regurgitation signal is inadequate for assessing  PA pressure.   3. The mitral valve is normal in structure. Trivial mitral valve  regurgitation. No evidence of mitral stenosis.   4. The aortic valve is tricuspid. Aortic valve regurgitation is not  visualized. No aortic stenosis is present.   5. The inferior vena cava is normal in size with <50% respiratory  variability, suggesting right atrial pressure of 8 mmHg.   Past Medical History:  Diagnosis Date   A-fib [facility])    Alcohol abuse    Allergy 15+ Years ago   Chocolate seasonal fall allergies   Anxiety    Arrhythmia May 2020   Arthritis    Back pain    Clotting disorder    chronic nose bleeds since childhood   De Quervain's disease (tenosynovitis)    Depression Previous history not currently being treated   Dermatophytosis, scalp    Diabetes mellitus    Drug  use    Dysrhythmia    afib   Edema of both lower extremities    Fatty liver    Female infertility    Gallbladder problem    Hypercholesteremia    Joint pain    Obesity    Osteoarthritis    Palpitations    PCOS (polycystic ovarian syndrome)    Pneumonia    Polycystic ovarian disease    PONV (postoperative nausea and vomiting)    Psoriatic arthritis (HCC)    Sleep apnea CPAP therapy started in [facility]   SOB (shortness of breath)    Vitamin D  deficiency    Wears glasses     Past Surgical History:  Procedure Laterality Date   BREAST BIOPSY Right     CARPAL TUNNEL RELEASE     COLONOSCOPY     DE QUERVAIN'S RELEASE  2012   left   KNEE ARTHROPLASTY     KNEE ARTHROSCOPY Right 05/06/2013   Procedure: RIGHT KNEE ARTHROSCOPY ;  Surgeon: Maude KANDICE Herald, MD;  Location: [facility];  Service: Orthopedics;  Laterality: Right;  partial lateral minisectomy and chondroplasty   left rotator cuff surgery      WISDOM TOOTH EXTRACTION     WRIST SURGERY  2001   carpel tunnel -rt    MEDICATIONS:  magnesium gluconate (MAGONATE) 500 (27 Mg) MG TABS tablet   ACCU-CHEK GUIDE test strip   acetaminophen  (TYLENOL ) 325 MG tablet   amphetamine -dextroamphetamine  (ADDERALL) 10 MG tablet   calcipotriene  (DOVONOX) 0.005 % cream   celecoxib  (CELEBREX ) 100 MG capsule   clobetasol  (TEMOVATE ) 0.05 % external solution   clotrimazole -betamethasone  (LOTRISONE ) cream   Continuous Glucose Sensor (FREESTYLE LIBRE 3 PLUS SENSOR) MISC   desonide  (DESOWEN ) 0.05 % cream   doxepin  (SINEQUAN ) 10 MG capsule   estradiol (ESTRACE) 0.1 MG/GM vaginal cream   glucose blood (FREESTYLE LITE) test strip   guselkumab  (TREMFYA  PEN) 100 MG/ML pen   guselkumab  (TREMFYA  PEN) 100 MG/ML pen   lisdexamfetamine (VYVANSE ) 60 MG capsule   metFORMIN  (GLUCOPHAGE -XR) 500 MG 24 hr tablet   mupirocin  ointment (BACTROBAN ) 2 %   nystatin -triamcinolone  ointment (MYCOLOG)   Omega-3 Fatty Acids (FISH OIL) 1200 MG CAPS   ondansetron  (ZOFRAN -ODT) 4 MG disintegrating tablet   tirzepatide  (MOUNJARO ) 7.5 MG/0.5ML Pen   No current facility-administered medications for this encounter.    Harlene Hoots Ward, PA-C [facility] Pre-Surgical Testing [phone]

## 2024-03-20 NOTE — Anesthesia Preprocedure Evaluation (Addendum)
 Anesthesia Evaluation  Patient identified by MRN, date of birth, ID band Patient awake    Reviewed: Allergy & Precautions, NPO status , Patient's Chart, lab work & pertinent test results  History of Anesthesia Complications (+) PONV and history of anesthetic complications  Airway Mallampati: II  TM Distance: >3 FB Neck ROM: Full    Dental no notable dental hx.    Pulmonary sleep apnea and Continuous Positive Airway Pressure Ventilation , former smoker   Pulmonary exam normal        Cardiovascular negative cardio ROS + dysrhythmias Atrial Fibrillation  Rhythm:Regular Rate:Normal  CV: Echo 08/29/2022 1. Left ventricular ejection fraction by 3D volume is 62 %. The left  ventricle has normal function. The left ventricle has no regional wall  motion abnormalities. Left ventricular diastolic parameters are consistent  with Grade I diastolic dysfunction  (impaired relaxation).   2. Right ventricular systolic function is normal. The right ventricular  size is normal. Tricuspid regurgitation signal is inadequate for assessing  PA pressure.   3. The mitral valve is normal in structure. Trivial mitral valve  regurgitation. No evidence of mitral stenosis.   4. The aortic valve is tricuspid. Aortic valve regurgitation is not  visualized. No aortic stenosis is present.   5. The inferior vena cava is normal in size with <50% respiratory  variability, suggesting right atrial pressure of 8 mmHg.        Neuro/Psych   Anxiety Depression    negative neurological ROS     GI/Hepatic negative GI ROS, Neg liver ROS,,,  Endo/Other  diabetes, Type 2, Oral Hypoglycemic Agents    Renal/GU negative Renal ROS  negative genitourinary   Musculoskeletal  (+) Arthritis , Osteoarthritis,    Abdominal Normal abdominal exam  (+)   Peds  Hematology Lab Results      Component                Value               Date                      WBC                       8.3                 03/19/2024                HGB                      14.2                03/19/2024                HCT                      44.3                03/19/2024                MCV                      90.8                03/19/2024                PLT  187                 03/19/2024              Anesthesia Other Findings   Reproductive/Obstetrics                              Anesthesia Physical Anesthesia Plan  ASA: 3  Anesthesia Plan: General and Regional   Post-op Pain Management: Regional block*   Induction: Intravenous  PONV Risk Score and Plan: 4 or greater and Ondansetron , Dexamethasone , Midazolam  and Treatment may vary due to age or medical condition  Airway Management Planned: Mask and Oral ETT  Additional Equipment: None  Intra-op Plan:   Post-operative Plan: Extubation in OR  Informed Consent: I have reviewed the patients History and Physical, chart, labs and discussed the procedure including the risks, benefits and alternatives for the proposed anesthesia with the patient or authorized representative who has indicated his/her understanding and acceptance.     Dental advisory given  Plan Discussed with: CRNA  Anesthesia Plan Comments: (See PAT note 03/19/2024)         Anesthesia Quick Evaluation

## 2024-03-24 ENCOUNTER — Other Ambulatory Visit (HOSPITAL_COMMUNITY): Payer: Self-pay

## 2024-03-24 MED ORDER — FREESTYLE LIBRE 3 PLUS SENSOR MISC
0 refills | Status: AC
  Filled 2024-03-24: qty 6, 90d supply, fill #0

## 2024-03-24 NOTE — Addendum Note (Signed)
 Addended by: ALVIA VELMA BRAVO on: 03/24/2024 05:03 PM   Modules accepted: Orders

## 2024-03-26 DIAGNOSIS — F4322 Adjustment disorder with anxiety: Secondary | ICD-10-CM | POA: Diagnosis not present

## 2024-03-27 ENCOUNTER — Other Ambulatory Visit (HOSPITAL_COMMUNITY): Payer: Self-pay

## 2024-03-27 ENCOUNTER — Ambulatory Visit (HOSPITAL_COMMUNITY): Payer: Self-pay | Admitting: Medical

## 2024-03-27 ENCOUNTER — Encounter (HOSPITAL_COMMUNITY)
Admission: RE | Disposition: A | Discharge status: Home / Self Care | Payer: Self-pay | Source: Ambulatory Visit | Attending: Orthopedic Surgery

## 2024-03-27 ENCOUNTER — Encounter (HOSPITAL_COMMUNITY): Payer: Self-pay | Admitting: Orthopedic Surgery

## 2024-03-27 ENCOUNTER — Ambulatory Visit (HOSPITAL_BASED_OUTPATIENT_CLINIC_OR_DEPARTMENT_OTHER): Payer: Self-pay | Admitting: Certified Registered"

## 2024-03-27 ENCOUNTER — Ambulatory Visit (HOSPITAL_COMMUNITY)
Admission: RE | Admit: 2024-03-27 | Discharge: 2024-03-27 | Disposition: A | Discharge status: Home / Self Care | Source: Ambulatory Visit | Attending: Orthopedic Surgery | Admitting: Orthopedic Surgery

## 2024-03-27 DIAGNOSIS — E119 Type 2 diabetes mellitus without complications: Secondary | ICD-10-CM | POA: Diagnosis not present

## 2024-03-27 DIAGNOSIS — G8918 Other acute postprocedural pain: Secondary | ICD-10-CM | POA: Diagnosis not present

## 2024-03-27 DIAGNOSIS — I4891 Unspecified atrial fibrillation: Secondary | ICD-10-CM | POA: Diagnosis not present

## 2024-03-27 DIAGNOSIS — M1990 Unspecified osteoarthritis, unspecified site: Secondary | ICD-10-CM | POA: Diagnosis not present

## 2024-03-27 DIAGNOSIS — G4730 Sleep apnea, unspecified: Secondary | ICD-10-CM | POA: Diagnosis not present

## 2024-03-27 DIAGNOSIS — M25811 Other specified joint disorders, right shoulder: Secondary | ICD-10-CM | POA: Diagnosis not present

## 2024-03-27 DIAGNOSIS — M75121 Complete rotator cuff tear or rupture of right shoulder, not specified as traumatic: Secondary | ICD-10-CM

## 2024-03-27 DIAGNOSIS — M7541 Impingement syndrome of right shoulder: Secondary | ICD-10-CM | POA: Diagnosis not present

## 2024-03-27 DIAGNOSIS — Z6838 Body mass index (BMI) 38.0-38.9, adult: Secondary | ICD-10-CM | POA: Diagnosis not present

## 2024-03-27 DIAGNOSIS — S46011A Strain of muscle(s) and tendon(s) of the rotator cuff of right shoulder, initial encounter: Secondary | ICD-10-CM | POA: Diagnosis not present

## 2024-03-27 DIAGNOSIS — Z01818 Encounter for other preprocedural examination: Secondary | ICD-10-CM

## 2024-03-27 DIAGNOSIS — M75101 Unspecified rotator cuff tear or rupture of right shoulder, not specified as traumatic: Secondary | ICD-10-CM | POA: Diagnosis present

## 2024-03-27 DIAGNOSIS — Z7984 Long term (current) use of oral hypoglycemic drugs: Secondary | ICD-10-CM | POA: Diagnosis not present

## 2024-03-27 DIAGNOSIS — Z87891 Personal history of nicotine dependence: Secondary | ICD-10-CM | POA: Diagnosis not present

## 2024-03-27 DIAGNOSIS — I480 Paroxysmal atrial fibrillation: Secondary | ICD-10-CM | POA: Diagnosis not present

## 2024-03-27 DIAGNOSIS — M65811 Other synovitis and tenosynovitis, right shoulder: Secondary | ICD-10-CM | POA: Insufficient documentation

## 2024-03-27 DIAGNOSIS — S43431A Superior glenoid labrum lesion of right shoulder, initial encounter: Secondary | ICD-10-CM | POA: Diagnosis not present

## 2024-03-27 HISTORY — PX: SUBACROMIAL DECOMPRESSION: SHX5174

## 2024-03-27 HISTORY — PX: SHOULDER ARTHROSCOPY WITH ROTATOR CUFF REPAIR: SHX5685

## 2024-03-27 LAB — GLUCOSE, CAPILLARY
Glucose-Capillary: 150 mg/dL — ABNORMAL HIGH (ref 70–99)
Glucose-Capillary: 142 mg/dL — ABNORMAL HIGH (ref 70–99)
Glucose-Capillary: 136 mg/dL — ABNORMAL HIGH (ref 70–99)

## 2024-03-27 SURGERY — ARTHROSCOPY, SHOULDER, WITH ROTATOR CUFF REPAIR
Anesthesia: Regional | Site: Shoulder | Laterality: Right

## 2024-03-27 MED ORDER — MIDAZOLAM HCL (PF) 2 MG/2ML IJ SOLN
2.0000 mg | Freq: Once | INTRAMUSCULAR | Status: DC
  Filled 2024-03-27: qty 2

## 2024-03-27 MED ORDER — SODIUM CHLORIDE 0.9 % IR SOLN
Status: DC | PRN
  Administered 2024-03-27: 6000 mL
  Administered 2024-03-27: 3000 mL

## 2024-03-27 MED ORDER — CEFAZOLIN SODIUM-DEXTROSE 2-4 GM/100ML-% IV SOLN
INTRAVENOUS | Status: AC
  Filled 2024-03-27: qty 100

## 2024-03-27 MED ORDER — PROPOFOL 10 MG/ML IV BOLUS
INTRAVENOUS | Status: DC | PRN
Start: 2024-03-27 — End: 2024-03-27
  Administered 2024-03-27: 150 mg via INTRAVENOUS

## 2024-03-27 MED ORDER — SUGAMMADEX SODIUM 200 MG/2ML IV SOLN
INTRAVENOUS | Status: DC | PRN
  Administered 2024-03-27: 225 mg via INTRAVENOUS

## 2024-03-27 MED ORDER — PROPOFOL 500 MG/50ML IV EMUL
INTRAVENOUS | Status: DC | PRN
  Administered 2024-03-27: 180 ug/kg/min via INTRAVENOUS

## 2024-03-27 MED ORDER — FENTANYL CITRATE (PF) 50 MCG/ML IJ SOSY
50.0000 ug | PREFILLED_SYRINGE | INTRAMUSCULAR | Status: AC
  Administered 2024-03-27: 50 ug via INTRAVENOUS
  Filled 2024-03-27: qty 2

## 2024-03-27 MED ORDER — DROPERIDOL 2.5 MG/ML IJ SOLN: 0.6250 mg | Freq: Once | INTRAMUSCULAR | Status: DC | PRN

## 2024-03-27 MED ORDER — BUPIVACAINE LIPOSOME 1.3 % IJ SUSP
INTRAMUSCULAR | Status: DC | PRN
  Administered 2024-03-27: 10 mL via PERINEURAL

## 2024-03-27 MED ORDER — ONDANSETRON HCL 4 MG/2ML IJ SOLN
INTRAMUSCULAR | Status: DC | PRN
  Administered 2024-03-27: 4 mg via INTRAVENOUS

## 2024-03-27 MED ORDER — ORAL CARE MOUTH RINSE: 15.0000 mL | Freq: Once | OROMUCOSAL | Status: DC

## 2024-03-27 MED ORDER — ROCURONIUM BROMIDE 100 MG/10ML IV SOLN
INTRAVENOUS | Status: DC | PRN
Start: 2024-03-27 — End: 2024-03-27
  Administered 2024-03-27: 10 mg via INTRAVENOUS
  Administered 2024-03-27: 50 mg via INTRAVENOUS

## 2024-03-27 MED ORDER — DEXAMETHASONE SOD PHOSPHATE PF 10 MG/ML IJ SOLN
INTRAMUSCULAR | Status: DC | PRN
  Administered 2024-03-27: 4 mg via INTRAVENOUS

## 2024-03-27 MED ORDER — PHENYLEPHRINE HCL (PRESSORS) 10 MG/ML IV SOLN
INTRAVENOUS | Status: DC | PRN
Start: 2024-03-27 — End: 2024-03-27
  Administered 2024-03-27 (×3): 40 ug via INTRAVENOUS
  Administered 2024-03-27: 80 ug via INTRAVENOUS
  Administered 2024-03-27: 40 ug via INTRAVENOUS

## 2024-03-27 MED ORDER — ACETAMINOPHEN 10 MG/ML IV SOLN
1000.0000 mg | Freq: Once | INTRAVENOUS | Status: DC | PRN
Start: 2024-03-27 — End: 2024-03-27

## 2024-03-27 MED ORDER — METHOCARBAMOL 500 MG PO TABS
500.0000 mg | ORAL_TABLET | Freq: Four times a day (QID) | ORAL | 0 refills | Status: DC | PRN
  Filled 2024-03-27: qty 30, 8d supply, fill #0

## 2024-03-27 MED ORDER — LIDOCAINE HCL (CARDIAC) PF 100 MG/5ML IV SOSY
PREFILLED_SYRINGE | INTRAVENOUS | Status: DC | PRN
Start: 2024-03-27 — End: 2024-03-27
  Administered 2024-03-27: 60 mg via INTRAVENOUS

## 2024-03-27 MED ORDER — FENTANYL CITRATE (PF) 100 MCG/2ML IJ SOLN
INTRAMUSCULAR | Status: DC | PRN
  Administered 2024-03-27: 25 ug via INTRAVENOUS
  Administered 2024-03-27: 50 ug via INTRAVENOUS

## 2024-03-27 MED ORDER — INSULIN ASPART 100 UNIT/ML IJ SOLN
INTRAMUSCULAR | Status: AC
  Administered 2024-03-27: 2 [IU] via SUBCUTANEOUS
  Filled 2024-03-27: qty 1

## 2024-03-27 MED ORDER — FENTANYL CITRATE (PF) 50 MCG/ML IJ SOSY: 25.0000 ug | PREFILLED_SYRINGE | INTRAMUSCULAR | Status: DC | PRN

## 2024-03-27 MED ORDER — CEFAZOLIN SODIUM-DEXTROSE 2-3 GM-%(50ML) IV SOLR
INTRAVENOUS | Status: DC | PRN
  Administered 2024-03-27: 2 g via INTRAVENOUS

## 2024-03-27 MED ORDER — OXYCODONE HCL 5 MG PO TABS
5.0000 mg | ORAL_TABLET | ORAL | 0 refills | Status: DC | PRN
  Filled 2024-03-27: qty 30, 5d supply, fill #0

## 2024-03-27 MED ORDER — LACTATED RINGERS IV SOLN: INTRAVENOUS | Status: DC

## 2024-03-27 MED ORDER — LIDOCAINE HCL (PF) 2 % IJ SOLN
INTRAMUSCULAR | Status: DC | PRN
  Administered 2024-03-27: 60 mg via INTRADERMAL

## 2024-03-27 MED ORDER — CHLORHEXIDINE GLUCONATE 0.12 % MT SOLN: 15.0000 mL | Freq: Once | OROMUCOSAL | Status: DC

## 2024-03-27 MED ORDER — BUPIVACAINE HCL (PF) 0.5 % IJ SOLN
INTRAMUSCULAR | Status: DC | PRN
  Administered 2024-03-27: 10 mL via PERINEURAL

## 2024-03-27 MED ORDER — INSULIN ASPART 100 UNIT/ML IJ SOLN: 0.0000 [IU] | INTRAMUSCULAR | Status: DC | PRN

## 2024-03-27 SURGICAL SUPPLY — 52 items
ANCHOR PEEK 4.75X19.1 SWLK C (Anchor) IMPLANT
ANCHOR SVLK SP PEEK 4.75BLUE#2 (Anchor) IMPLANT
BAG COUNTER SPONGE SURGICOUNT (BAG) IMPLANT
BOOTIES KNEE HIGH SLOAN (MISCELLANEOUS) ×2 IMPLANT
BURR OVAL 8 FLU 4.0X13 (MISCELLANEOUS) ×1 IMPLANT
CANNULA 5.75X7 CRYSTAL CLEAR (CANNULA) ×1 IMPLANT
CANNULA TWIST IN 8.25X7CM (CANNULA) IMPLANT
COVER SURGICAL LIGHT HANDLE (MISCELLANEOUS) IMPLANT
CUTTER BONE 4.0MM X 13CM (MISCELLANEOUS) ×1 IMPLANT
DISSECTOR 3.8MM X 13CM (MISCELLANEOUS) IMPLANT
DRAPE FOOT SWITCH (DRAPES) ×2 IMPLANT
DRAPE IMP U-DRAPE 54X76 (DRAPES) ×1 IMPLANT
DRAPE INCISE IOBAN 66X45 STRL (DRAPES) IMPLANT
DRAPE STERI 35X30 U-POUCH (DRAPES) ×1 IMPLANT
DRAPE SURG 17X23 STRL (DRAPES) ×1 IMPLANT
DRAPE SURG ORHT 6 SPLT 77X108 (DRAPES) ×2 IMPLANT
DRAPE TOP 10253 STERILE (DRAPES) ×1 IMPLANT
DRAPE U-SHAPE 47X51 STRL (DRAPES) ×1 IMPLANT
DRSG XEROFORM 1X8 (GAUZE/BANDAGES/DRESSINGS) IMPLANT
DURAPREP 26ML APPLICATOR (WOUND CARE) ×1 IMPLANT
ELECT REM PT RETURN 15FT ADLT (MISCELLANEOUS) IMPLANT
GAUZE PAD ABD 8X10 STRL (GAUZE/BANDAGES/DRESSINGS) ×2 IMPLANT
GAUZE SPONGE 4X4 12PLY STRL (GAUZE/BANDAGES/DRESSINGS) ×1 IMPLANT
GAUZE XEROFORM 1X8 LF (GAUZE/BANDAGES/DRESSINGS) ×1 IMPLANT
GLOVE BIO SURGEON STRL SZ7.5 (GLOVE) ×1 IMPLANT
GLOVE BIOGEL PI IND STRL 6.5 (GLOVE) ×1 IMPLANT
GLOVE BIOGEL PI IND STRL 8 (GLOVE) ×1 IMPLANT
GLOVE SURG SS PI 6.5 STRL IVOR (GLOVE) ×1 IMPLANT
GOWN STRL REUS W/ TWL LRG LVL3 (GOWN DISPOSABLE) ×1 IMPLANT
GOWN STRL REUS W/ TWL XL LVL3 (GOWN DISPOSABLE) ×1 IMPLANT
KIT BASIN OR (CUSTOM PROCEDURE TRAY) ×1 IMPLANT
KIT TURNOVER KIT A (KITS) ×1 IMPLANT
LASSO CRESCENT QUICKPASS (SUTURE) IMPLANT
MANIFOLD NEPTUNE II (INSTRUMENTS) ×1 IMPLANT
MAT ABSORB FLUID 56X50 GRAY (MISCELLANEOUS) ×1 IMPLANT
NDL HD SCORPION MEGA LOADER (NEEDLE) IMPLANT
PACK ARTHROSCOPY WL (CUSTOM PROCEDURE TRAY) ×1 IMPLANT
PROTECTOR NERVE ULNAR (MISCELLANEOUS) ×1 IMPLANT
RESTRAINT HEAD UNIVERSAL NS (MISCELLANEOUS) ×1 IMPLANT
SLING ARM FOAM STRAP LRG (SOFTGOODS) IMPLANT
SLING ARM FOAM STRAP MED (SOFTGOODS) IMPLANT
SUPPORT WRAP ARM LG (MISCELLANEOUS) IMPLANT
SUT ETHILON 3 0 PS 1 (SUTURE) ×1 IMPLANT
SUT PDS AB 1 CT1 27 (SUTURE) IMPLANT
SUT TIGER TAPE 7 IN WHITE (SUTURE) IMPLANT
SUT VIC AB 0 CT1 36 (SUTURE) IMPLANT
SUTURE TAPE 1.3 40 TPR END (SUTURE) IMPLANT
TAPE CLOTH SURG 6X10 NS LF (GAUZE/BANDAGES/DRESSINGS) IMPLANT
TAPE FIBER 2MM 7IN #2 BLUE (SUTURE) IMPLANT
TOWEL OR 17X26 10 PK STRL BLUE (TOWEL DISPOSABLE) ×1 IMPLANT
TUBING ARTHROSCOPY IRRIG 16FT (MISCELLANEOUS) ×1 IMPLANT
WAND ABLATOR APOLLO I90 (BUR) ×1 IMPLANT

## 2024-03-27 NOTE — Transfer of Care (Signed)
 Immediate Anesthesia Transfer of Care Note  Patient: Tabitha [name]  Procedure(s) Performed: ARTHROSCOPY, SHOULDER, WITH ROTATOR CUFF REPAIR (Right: Shoulder) DECOMPRESSION, SUBACROMIAL SPACE (Right: Shoulder)  Patient Location: PACU  Anesthesia Type:General  Level of Consciousness: awake, alert , oriented, and patient cooperative  Airway & Oxygen Therapy: Patient Spontanous Breathing and Patient connected to face mask oxygen  Post-op Assessment: Report given to RN and Post -op Vital signs reviewed and stable  Post vital signs: Reviewed and stable  Last Vitals:  Vitals Value Taken Time  BP 105/92 03/27/24 11:15  Temp    Pulse 69 03/27/24 11:16  Resp 22 03/27/24 11:16  SpO2 98 % 03/27/24 11:16  Vitals shown include unfiled device data.  Last Pain:  Vitals:   03/27/24 0840  TempSrc: Oral  PainSc: 0-No pain         Complications: No notable events documented.

## 2024-03-27 NOTE — Anesthesia Procedure Notes (Signed)
 Anesthesia Regional Block: Interscalene brachial plexus block   Pre-Anesthetic Checklist: , timeout performed,  Correct Patient, Correct Site, Correct Laterality,  Correct Procedure, Correct Position, site marked,  Risks and benefits discussed,  Surgical consent,  Pre-op evaluation,  At surgeon's request and post-op pain management  Laterality: Right  Prep: Dura Prep       Needles:  Injection technique: Single-shot  Needle Type: Echogenic Stimulator Needle     Needle Length: 5cm  Needle Gauge: 20     Additional Needles:   Procedures:,,,, ultrasound used (permanent image in chart),,    Narrative:  Start time: 03/27/2024 9:10 AM End time: 03/27/2024 9:15 AM Injection made incrementally with aspirations every 5 mL.  Performed by: Personally  Anesthesiologist: Dorethea Cordella SQUIBB, DO  Additional Notes: Patient identified. Risks/Benefits/Options discussed with patient including but not limited to bleeding, infection, nerve damage, failed block, incomplete pain control. Patient expressed understanding and wished to proceed. All questions were answered. Sterile technique was used throughout the entire procedure. Please see nursing notes for vital signs. Aspirated in 5cc intervals with injection for negative confirmation. Patient was given instructions on fall risk and not to get out of bed. All questions and concerns addressed with instructions to call with any issues or inadequate analgesia.

## 2024-03-27 NOTE — Discharge Instructions (Addendum)
Discharge Instructions after Arthroscopic Shoulder Repair ° ° °A sling has been provided for you. Remain in your sling at all times. This includes sleeping in your sling.  °Use ice on the shoulder intermittently over the first 48 hours after surgery.  °Pain medicine has been prescribed for you.  °Use your medicine liberally over the first 48 hours, and then you can begin to taper your use. You may take Extra Strength Tylenol or Tylenol only in place of the pain pills. DO NOT take ANY nonsteroidal anti-inflammatory pain medications: Advil, Motrin, Ibuprofen, Aleve, Naproxen, or Narprosyn.  °You may remove your dressing after two days. If the incision sites are still moist, place a Band-Aid over the moist site(s). Change Band-Aids daily until dry.  °You may shower 5 days after surgery. The incisions CANNOT get wet prior to 5 days. Simply allow the water to wash over the site and then pat dry. Do not rub the incisions. Make sure your axilla (armpit) is completely dry after showering.  °Take one aspirin a day for 2 weeks after surgery, unless you have an aspirin sensitivity/ allergy or asthma. ° ° °Please call [phone] during normal business hours or [phone] after hours for any problems. Including the following: ° °- excessive redness of the incisions °- drainage for more than 4 days °- fever of more than 101.5 F ° °*Please note that pain medications will not be refilled after hours or on weekends. ° ° ° °

## 2024-03-27 NOTE — Anesthesia Procedure Notes (Signed)
 Procedure Name: Intubation Date/Time: 03/27/2024 10:51 AM  Performed by: Kathern Rollene LABOR, CRNAPre-anesthesia Checklist: Patient identified, Emergency Drugs available, Suction available and Patient being monitored Patient Re-evaluated:Patient Re-evaluated prior to induction Oxygen Delivery Method: Circle system utilized Preoxygenation: Pre-oxygenation with 100% oxygen Induction Type: IV induction, Cricoid Pressure applied and Rapid sequence Ventilation: Mask ventilation without difficulty Laryngoscope Size: Mac and 4 Grade View: Grade II Tube type: Oral Tube size: 7.0 mm Number of attempts: 1 Airway Equipment and Method: Stylet Placement Confirmation: ETT inserted through vocal cords under direct vision, positive ETCO2 and breath sounds checked- equal and bilateral Secured at: 23 cm Tube secured with: Tape Dental Injury: Teeth and Oropharynx as per pre-operative assessment

## 2024-03-27 NOTE — H&P (Signed)
 Tabitha [name] is an 58 y.o. female.   Chief Complaint: R shoulder pain  HPI: R shoulder symptomatic full thickness rotator cuff tear, failed conservative treatment.  Past Medical History:  Diagnosis Date   A-fib [facility])    Alcohol abuse    Allergy 15+ Years ago   Chocolate seasonal fall allergies   Anxiety    Arrhythmia May 2020   Arthritis    Back pain    Clotting disorder    chronic nose bleeds since childhood   De Quervain's disease (tenosynovitis)    Depression Previous history not currently being treated   Dermatophytosis, scalp    Diabetes mellitus    Drug use    Dysrhythmia    afib   Edema of both lower extremities    Fatty liver    Female infertility    Gallbladder problem    Hypercholesteremia    Joint pain    Obesity    Osteoarthritis    Palpitations    PCOS (polycystic ovarian syndrome)    Pneumonia    Polycystic ovarian disease    PONV (postoperative nausea and vomiting)    Psoriatic arthritis (HCC)    Sleep apnea CPAP therapy started in [employer]   SOB (shortness of breath)    Vitamin D  deficiency    Wears glasses     Past Surgical History:  Procedure Laterality Date   BREAST BIOPSY Right    CARPAL TUNNEL RELEASE     COLONOSCOPY     DE QUERVAIN'S RELEASE  2012   left   KNEE ARTHROPLASTY     KNEE ARTHROSCOPY Right 05/06/2013   Procedure: RIGHT KNEE ARTHROSCOPY ;  Surgeon: Maude KANDICE Herald, MD;  Location: [facility];  Service: Orthopedics;  Laterality: Right;  partial lateral minisectomy and chondroplasty   left rotator cuff surgery      WISDOM TOOTH EXTRACTION     WRIST SURGERY  2001   carpel tunnel -rt    Family History  Problem Relation Age of Onset   Cancer Mother 73       breast ca died at age 64   Breast cancer Mother 66   Depression Mother    Anxiety disorder Mother    Obesity Mother    Early death Mother    Diabetes Father    Hypertension Father    Stroke Father    Kidney disease Father    Heart disease Father     High Cholesterol Father    Obesity Father    Obesity Brother    Hypertension Brother    Heart disease Maternal Grandmother    Alzheimer's disease Maternal Grandmother    Hyperlipidemia Maternal Grandmother    Diabetes Paternal Grandmother    Obesity Paternal Grandmother    Heart disease Paternal Grandfather    Hypertension Other    Diabetes type II Other    Obesity Other    Social History:  reports that she quit smoking about 29 years ago. Her smoking use included cigarettes. She started smoking about 39 years ago. She has a 18 pack-year smoking history. She has never used smokeless tobacco. She reports current alcohol use of about 2.0 - 3.0 standard drinks of alcohol per week. She reports that she does not use drugs.  Allergies:  Allergies  Allergen Reactions   Chocolate Anaphylaxis   Corn-Containing Products Nausea Only    Fatigue, body aches, upset stomach,   Food     chocolate   Gluten Meal    Latex  Rash   Methotrexate  Rash    Medications Prior to Admission  Medication Sig Dispense Refill   ACCU-CHEK GUIDE test strip   3   acetaminophen  (TYLENOL ) 325 MG tablet Take 650 mg by mouth every 6 (six) hours as needed for moderate pain (pain score 4-6).     amphetamine -dextroamphetamine  (ADDERALL) 10 MG tablet Take 1 tablet by mouth daily as directed (Patient taking differently: Take 10 mg by mouth daily as needed (ADHD).) 30 tablet 0   calcipotriene  (DOVONOX) 0.005 % cream Apply topically 2 (two) times daily. Apply to ears and face until areas are clear (Patient taking differently: Apply 1 Application topically 2 (two) times daily as needed (psoriasis). Apply to ears and face until areas are clear) 120 g 9   clobetasol  (TEMOVATE ) 0.05 % external solution Apply topically 2 (two) times daily (Patient taking differently: Apply 1 Application topically 2 (two) times daily as needed (psoriasis).) 50 mL 5   clotrimazole -betamethasone  (LOTRISONE ) cream Apply to affected area(s) on skin 2  (two) times daily. (Patient taking differently: Apply 1 application  topically 2 (two) times daily as needed (psoriasis).) 30 g 0   desonide  (DESOWEN ) 0.05 % cream Apply 1 application topically 2 (two) times daily. (Patient taking differently: Apply 1 Application topically 2 (two) times daily as needed (psoriasis).) 60 g 5   doxepin  (SINEQUAN ) 10 MG capsule Take 1 capsule (10 mg total) by mouth at bedtime as needed. 90 capsule 1   estradiol (ESTRACE) 0.1 MG/GM vaginal cream Insert one gram vaginally every other day for 2 weeks.  Then insert one gram vaginally twice a week for maintenence. 42.5 g 11   glucose blood (FREESTYLE LITE) test strip Use to check blood sugar 3 times a day 300 strip 3   guselkumab  (TREMFYA  PEN) 100 MG/ML pen Inject 100mg  subcutanously at week 4 then every 8 weeks as directed 1 mL 6   lisdexamfetamine (VYVANSE ) 60 MG capsule Take 1 capsule (60 mg total) by mouth daily. 30 capsule 0   magnesium gluconate (MAGONATE) 500 (27 Mg) MG TABS tablet Take 500 mg by mouth in the morning and at bedtime. Magnesium gluconoate and citrate pt takes 1000mg  in pm for atrial fib     metFORMIN  (GLUCOPHAGE -XR) 500 MG 24 hr tablet Take 1 tablet (500 mg total) by mouth every evening. 90 tablet 0   mupirocin  ointment (BACTROBAN ) 2 % Apply 1 Application topically 2 (two) times daily. (Patient taking differently: Apply 1 Application topically 2 (two) times daily as needed (psoriasis).) 22 g 2   ondansetron  (ZOFRAN -ODT) 4 MG disintegrating tablet Take 1 tablet (4 mg total) by mouth every 8 (eight) hours as needed. 30 tablet 0   tirzepatide  (MOUNJARO ) 7.5 MG/0.5ML Pen Inject 7.5 mg into the skin once a week. 2 mL 0   celecoxib  (CELEBREX ) 100 MG capsule Take 1 capsule (100 mg total) by mouth 2 (two) times daily. 60 capsule 3   Continuous Glucose Sensor (FREESTYLE LIBRE 3 PLUS SENSOR) MISC Change sensor every 15 days. 6 each 0   guselkumab  (TREMFYA  PEN) 100 MG/ML pen Inject 100mg  subcutaneously at week 0 and  week 4 as directed (Patient not taking: Reported on 03/18/2024) 1 mL 1   nystatin -triamcinolone  ointment (MYCOLOG) Apply 1 Application topically 2 (two) times daily. Apply for 5 days then STOP (Patient not taking: Reported on 03/18/2024) 60 g 0   Omega-3 Fatty Acids (FISH OIL) 1200 MG CAPS Take 1,200-2,400 mg by mouth See admin instructions. Take 2,400 mg by mouth in the  morning and 1,200 mg in the evening      No results found. However, due to the size of the patient record, not all encounters were searched. Please check Results Review for a complete set of results. No results found.  Review of Systems  All other systems reviewed and are negative.   Blood pressure 136/61, pulse 80, temperature 98.3 F (36.8 C), temperature source Oral, resp. rate 18, height 5' 2 (1.575 m), weight 95.7 kg, SpO2 97%. Physical Exam HENT:     Head: Atraumatic.  Eyes:     Extraocular Movements: Extraocular movements intact.  Cardiovascular:     Pulses: Normal pulses.  Pulmonary:     Effort: Pulmonary effort is normal.  Musculoskeletal:     Comments: R shoulder pain and weakness with RC testing.  Neurological:     Mental Status: She is alert.      Assessment/Plan R shoulder symptomatic full thickness rotator cuff tear, failed conservative treatment. Plan R shoulder arth RCR, SAD Risks / benefits of surgery discussed Consent on chart  NPO for OR Preop antibiotics   Josefa LELON Herring, MD 03/27/2024, 8:48 AM

## 2024-03-27 NOTE — Anesthesia Postprocedure Evaluation (Signed)
 Anesthesia Post Note  Patient: ZYION LEIDNER  Procedure(s) Performed: ARTHROSCOPY, SHOULDER, WITH ROTATOR CUFF REPAIR (Right: Shoulder) DECOMPRESSION, SUBACROMIAL SPACE (Right: Shoulder)     Patient location during evaluation: PACU Anesthesia Type: Regional and General Level of consciousness: awake and alert Pain management: pain level controlled Vital Signs Assessment: post-procedure vital signs reviewed and stable Respiratory status: spontaneous breathing, nonlabored ventilation, respiratory function stable and patient connected to nasal cannula oxygen Cardiovascular status: blood pressure returned to baseline and stable Postop Assessment: no apparent nausea or vomiting Anesthetic complications: no   No notable events documented.  Last Vitals:  Vitals:   03/27/24 1130 03/27/24 1145  BP: (!) 104/54 105/61  Pulse: 67 66  Resp: 19 19  Temp:    SpO2: 94% 95%    Last Pain:  Vitals:   03/27/24 1145  TempSrc:   PainSc: 0-No pain                 Cordella P Omri Bertran

## 2024-03-27 NOTE — Op Note (Signed)
 Procedure(s): ARTHROSCOPY, SHOULDER, WITH ROTATOR CUFF REPAIR DECOMPRESSION, SUBACROMIAL SPACE Procedure Note  DANIYAH FOHL female 58 y.o. 03/27/2024  Preoperative diagnosis: Right shoulder full-thickness rotator cuff tear Right shoulder impingement with unfavorable acromial anatomy  Postoperative diagnosis: Same + superior labral tear, synovitis, partial subscapularis tear  Procedure(s) and Anesthesia Type:    * ARTHROSCOPY, SHOULDER, WITH ROTATOR CUFF REPAIR - General    * DECOMPRESSION, SUBACROMIAL SPACE - General Arthroscopic Extensive debridement right shoulder superior labral tear, synovitis, partial subscapularis tear  Surgeons and Role:    DEWAINE Dozier Soulier, MD - Primary     Surgeon: Josefa LELON Dozier   Assistants: Jeoffrey Northern PA-C Amber was present and scrubbed throughout the procedure and was essential in positioning, assisting with the camera and instrumentation,, and closure)  Anesthesia: General endotracheal anesthesia with preoperative interscalene block given by the attending anesthesiologist   Procedure Detail  ARTHROSCOPY, SHOULDER, WITH ROTATOR CUFF REPAIR, DECOMPRESSION, SUBACROMIAL SPACE  Estimated Blood Loss: Min         Drains: none  Blood Given: none         Specimens: none        Complications:  * No complications entered in OR log *         Disposition: PACU - hemodynamically stable.         Condition: stable    Procedure:   INDICATIONS FOR SURGERY: The patient is 58 y.o. female who has a symptomatic full-thickness tear of the supraspinatus with retraction.  Indicated for surgical treatment to try and decrease pain and restore function.  OPERATIVE FINDINGS: Examination under anesthesia: No stiffness or instability   DESCRIPTION OF PROCEDURE: The patient was identified in preoperative  holding area where I personally marked the operative site after  verifying site, side, and procedure with the patient. An interscalene block was  given by the attending anesthesiologist the holding area.  The patient was taken back to the operating room where general anesthesia was induced without complication and was placed in the beach-chair position with the back  elevated about 60 degrees and all extremities and head and neck carefully padded and  positioned.   The right upper extremity was then prepped and  draped in a standard sterile fashion. The appropriate time-out  procedure was carried out. The patient did receive IV antibiotics  within 30 minutes of incision.   A small posterior portal incision was made and the arthroscope was introduced into the joint. An anterior portal was then established above the subscapularis using needle localization. Small cannula was placed anteriorly. Diagnostic arthroscopy was then carried out.  Glenohumeral joint surfaces were intact without any significant chondromalacia.  The labrum had some superior fraying with absence of the proximal biceps tendon.  This was debrided.  There was extensive synovitis in the joint this was debrided back to stable capsule.  The anterior subscapularis was partially torn.  This was debrided back to healthy tendon.  The undersurface of the supraspinatus was examined and noted to have retracted tear.  This was lightly debrided from the undersurface.  The arthroscope was then introduced into the subacromial space a standard lateral portal was established with needle localization. The shaver was used through the lateral portal to perform extensive bursectomy.   The bursal surface of the rotator cuff tear was examined.  The tear was large and retracted and went into the infraspinatus as well.  The tear edge was debrided back.  It was easily reducible back over the tuberosity.  Therefore  the repair was carried out first placing 3 4.75 peek swivel lock anchors just off the articular margin evenly spaced with fiber tape and each.  The 6 suture strands were then passed evenly  throughout the tear and brought over to 2 additional lateral swivel lock anchors in a crossing suture bridge pattern.  Bone quality was fair.  The additional sutures from the lateral anchors were used for additional fixation.  The final repair was smooth and watertight.  The coracoacromial ligament was taken down off the anterior acromion with the ArthroCare exposing a moderate hooked anterior acromial spur. A high-speed bur was then used through the lateral portal to take down the anterior acromial spur from lateral to medial in a standard acromioplasty.  The acromioplasty was also viewed from the lateral portal and the bur was used as necessary to ensure that the acromion was completely flat from posterior to anterior.  The arthroscopic equipment was removed from the joint and the portals were closed with 3-0 nylon in an interrupted fashion. Sterile dressings were then applied including Xeroform 4 x 4's ABDs and tape. The patient was then allowed to awaken from general anesthesia, placed in a sling, transferred to the stretcher and taken to the recovery room in stable condition.   POSTOPERATIVE PLAN: The patient will be discharged home today and will followup in one week for suture removal and wound check.

## 2024-03-28 ENCOUNTER — Encounter (HOSPITAL_COMMUNITY): Payer: Self-pay | Admitting: Orthopedic Surgery

## 2024-03-28 ENCOUNTER — Other Ambulatory Visit (HOSPITAL_COMMUNITY): Payer: Self-pay

## 2024-03-31 ENCOUNTER — Ambulatory Visit (INDEPENDENT_AMBULATORY_CARE_PROVIDER_SITE_OTHER): Admitting: Internal Medicine

## 2024-03-31 ENCOUNTER — Ambulatory Visit: Admitting: Dermatology

## 2024-04-02 ENCOUNTER — Ambulatory Visit: Admitting: Family Medicine

## 2024-04-08 ENCOUNTER — Other Ambulatory Visit (HOSPITAL_COMMUNITY): Payer: Self-pay

## 2024-04-08 DIAGNOSIS — M6281 Muscle weakness (generalized): Secondary | ICD-10-CM | POA: Insufficient documentation

## 2024-04-08 DIAGNOSIS — M25611 Stiffness of right shoulder, not elsewhere classified: Secondary | ICD-10-CM | POA: Diagnosis not present

## 2024-04-08 MED ORDER — FLUZONE 0.5 ML IM SUSY
0.5000 mL | PREFILLED_SYRINGE | Freq: Once | INTRAMUSCULAR | 0 refills | Status: AC
  Filled 2024-04-08: qty 0.5, 1d supply, fill #0

## 2024-04-10 DIAGNOSIS — M25611 Stiffness of right shoulder, not elsewhere classified: Secondary | ICD-10-CM | POA: Diagnosis not present

## 2024-04-10 DIAGNOSIS — M6281 Muscle weakness (generalized): Secondary | ICD-10-CM | POA: Diagnosis not present

## 2024-04-11 ENCOUNTER — Encounter: Payer: Self-pay | Admitting: Family Medicine

## 2024-04-12 ENCOUNTER — Encounter: Payer: Self-pay | Admitting: Family Medicine

## 2024-04-14 DIAGNOSIS — M25611 Stiffness of right shoulder, not elsewhere classified: Secondary | ICD-10-CM | POA: Diagnosis not present

## 2024-04-14 DIAGNOSIS — M6281 Muscle weakness (generalized): Secondary | ICD-10-CM | POA: Diagnosis not present

## 2024-04-15 ENCOUNTER — Encounter (INDEPENDENT_AMBULATORY_CARE_PROVIDER_SITE_OTHER): Payer: Self-pay | Admitting: Internal Medicine

## 2024-04-16 ENCOUNTER — Other Ambulatory Visit: Payer: Self-pay

## 2024-04-16 ENCOUNTER — Other Ambulatory Visit (HOSPITAL_COMMUNITY): Payer: Self-pay

## 2024-04-16 MED ORDER — TIRZEPATIDE 5 MG/0.5ML ~~LOC~~ SOAJ
5.0000 mg | SUBCUTANEOUS | 0 refills | Status: DC
  Filled 2024-04-16: qty 6, 84d supply, fill #0

## 2024-04-16 MED ORDER — LISDEXAMFETAMINE DIMESYLATE 60 MG PO CAPS
60.0000 mg | ORAL_CAPSULE | Freq: Every day | ORAL | 0 refills | Status: DC
  Filled 2024-04-16: qty 30, 30d supply, fill #0

## 2024-04-17 ENCOUNTER — Ambulatory Visit: Admitting: Family Medicine

## 2024-04-17 DIAGNOSIS — M6281 Muscle weakness (generalized): Secondary | ICD-10-CM | POA: Diagnosis not present

## 2024-04-17 DIAGNOSIS — M25611 Stiffness of right shoulder, not elsewhere classified: Secondary | ICD-10-CM | POA: Diagnosis not present

## 2024-04-18 DIAGNOSIS — F4322 Adjustment disorder with anxiety: Secondary | ICD-10-CM | POA: Diagnosis not present

## 2024-04-21 DIAGNOSIS — M25611 Stiffness of right shoulder, not elsewhere classified: Secondary | ICD-10-CM | POA: Diagnosis not present

## 2024-04-21 DIAGNOSIS — M6281 Muscle weakness (generalized): Secondary | ICD-10-CM | POA: Diagnosis not present

## 2024-04-22 ENCOUNTER — Other Ambulatory Visit: Payer: Self-pay

## 2024-04-23 ENCOUNTER — Other Ambulatory Visit (HOSPITAL_COMMUNITY): Payer: Self-pay

## 2024-04-23 ENCOUNTER — Encounter (INDEPENDENT_AMBULATORY_CARE_PROVIDER_SITE_OTHER): Payer: Self-pay

## 2024-04-23 ENCOUNTER — Other Ambulatory Visit: Payer: Self-pay

## 2024-04-24 ENCOUNTER — Other Ambulatory Visit: Payer: Self-pay

## 2024-04-24 ENCOUNTER — Other Ambulatory Visit (HOSPITAL_COMMUNITY): Payer: Self-pay

## 2024-04-24 ENCOUNTER — Telehealth: Admitting: Family Medicine

## 2024-04-24 DIAGNOSIS — M6281 Muscle weakness (generalized): Secondary | ICD-10-CM | POA: Diagnosis not present

## 2024-04-24 DIAGNOSIS — N3000 Acute cystitis without hematuria: Secondary | ICD-10-CM | POA: Diagnosis not present

## 2024-04-24 DIAGNOSIS — M25611 Stiffness of right shoulder, not elsewhere classified: Secondary | ICD-10-CM | POA: Diagnosis not present

## 2024-04-24 MED ORDER — FLUCONAZOLE 150 MG PO TABS
150.0000 mg | ORAL_TABLET | Freq: Every day | ORAL | 0 refills | Status: AC
  Filled 2024-04-24: qty 1, 1d supply, fill #0

## 2024-04-24 MED ORDER — CEPHALEXIN 500 MG PO CAPS
500.0000 mg | ORAL_CAPSULE | Freq: Two times a day (BID) | ORAL | 0 refills | Status: AC
  Filled 2024-04-24: qty 14, 7d supply, fill #0

## 2024-04-24 NOTE — Progress Notes (Signed)

## 2024-04-24 NOTE — Progress Notes (Signed)
 Clinical Intervention Note  Clinical Intervention Notes: Patient started estradiol, no DDIs identified with her Tremfya .   Clinical Intervention Outcomes: Prevention of an adverse drug event   Silvano LOISE Blair Karel Santa

## 2024-04-24 NOTE — Progress Notes (Signed)
 Specialty Pharmacy Refill Coordination Note  MyChart Questionnaire Submission  Tabitha [name] is a 58 y.o. female contacted today regarding refills of specialty medication(s) Tremfya .  Doses on hand: (Patient-Rptd) 0   Injection date: (Patient-Rptd) 05/05/24  Patient requested: (Patient-Rptd) Pickup at [facility] at [facility] date: 05/02/24  Medication will be filled on 05/01/24.

## 2024-04-28 ENCOUNTER — Encounter (INDEPENDENT_AMBULATORY_CARE_PROVIDER_SITE_OTHER): Payer: Self-pay | Admitting: Internal Medicine

## 2024-04-28 ENCOUNTER — Ambulatory Visit (INDEPENDENT_AMBULATORY_CARE_PROVIDER_SITE_OTHER): Payer: Self-pay | Admitting: Internal Medicine

## 2024-04-28 ENCOUNTER — Other Ambulatory Visit (HOSPITAL_COMMUNITY): Payer: Self-pay

## 2024-04-28 VITALS — BP 122/78 | HR 70 | Temp 97.7°F | Ht 61.0 in | Wt 213.0 lb

## 2024-04-28 DIAGNOSIS — G4733 Obstructive sleep apnea (adult) (pediatric): Secondary | ICD-10-CM

## 2024-04-28 DIAGNOSIS — K5903 Drug induced constipation: Secondary | ICD-10-CM | POA: Diagnosis not present

## 2024-04-28 DIAGNOSIS — E66813 Obesity, class 3: Secondary | ICD-10-CM | POA: Diagnosis not present

## 2024-04-28 DIAGNOSIS — M6281 Muscle weakness (generalized): Secondary | ICD-10-CM | POA: Diagnosis not present

## 2024-04-28 DIAGNOSIS — Z6841 Body Mass Index (BMI) 40.0 and over, adult: Secondary | ICD-10-CM | POA: Diagnosis not present

## 2024-04-28 DIAGNOSIS — Z7984 Long term (current) use of oral hypoglycemic drugs: Secondary | ICD-10-CM | POA: Diagnosis not present

## 2024-04-28 DIAGNOSIS — E1169 Type 2 diabetes mellitus with other specified complication: Secondary | ICD-10-CM

## 2024-04-28 DIAGNOSIS — Z7985 Long-term (current) use of injectable non-insulin antidiabetic drugs: Secondary | ICD-10-CM

## 2024-04-28 DIAGNOSIS — M25611 Stiffness of right shoulder, not elsewhere classified: Secondary | ICD-10-CM | POA: Diagnosis not present

## 2024-04-28 MED ORDER — METFORMIN HCL ER 500 MG PO TB24
500.0000 mg | ORAL_TABLET | Freq: Every evening | ORAL | 0 refills | Status: AC
  Filled 2024-04-28: qty 90, 90d supply, fill #0

## 2024-04-28 NOTE — Assessment & Plan Note (Signed)
 On CPAP with reported good compliance. Continue PAP therapy. Losing 15% or more of body weight may improve AHI.  She benefits from GLP-1 aided weight management.

## 2024-04-28 NOTE — Progress Notes (Signed)
 Office: [phone]  /  Fax: [phone]  Weight Summary and Body Composition Analysis (BIA)  Vitals Temp: 97.7 F (36.5 C) BP: 122/78 Pulse Rate: 70 SpO2: 98 %   Anthropometric Measurements Height: 5' 1 (1.549 m) Weight: 213 lb (96.6 kg) BMI (Calculated): 40.27 Weight at Last Visit: 213 lb Weight Lost Since Last Visit: 0 lb Weight Gained Since Last Visit: 0 lb Starting Weight: 218 lb Total Weight Loss (lbs): 5 lb (2.268 kg) Peak Weight: 280 lb   Body Composition  Body Fat %: 45.6 % Fat Mass (lbs): 97.4 lbs Muscle Mass (lbs): 116 lbs Total Body Water (lbs): 76.4 lbs Visceral Fat Rating : 15    RMR: 1886  Today's Visit #: 11  Starting Date: 07/12/23   Subjective   Chief Complaint: Obesity  Interval History Discussed the use of AI scribe software for clinical note transcription with the patient, who gave verbal consent to proceed.  History of Present Illness Tabitha [name] is a 58 year old female with type 2 diabetes and obstructive sleep apnea who presents for medical weight management.  She is accompanied by her husband Carlin who has type 1 diabetes  She is currently on Mounjaro  5 mg once a week and metformin , one tablet in the evening. She restarted Mounjaro  last Wednesday after a brief pause around her recent rotator cuff surgery. Despite following a calorie nutrition plan 75% of the time, aiming for 1200 calories per day with a macro distribution of 40% carbs, 30% protein, and 30% fat, she has not lost weight but has maintained her current weight. She exercises five days a week for 30 to 60 minutes, including cardiovascular activities and leg exercises. She has been tracking her food intake for about ten days and notes that her appetite control has been good since restarting Mounjaro .  She underwent rotator cuff surgery nearly five weeks ago and has been in physical therapy for about three weeks. She describes this recovery as harder than previous  ones, with more discomfort and difficulty sleeping initially, though it is improving. She has been able to increase her cardiovascular activity since starting physical therapy, using a recumbent bike and treadmill at home.  She reports constipation issues since the surgery, which were present before but have worsened post-operatively. She uses Miralax once a day and ensures adequate hydration with at least 90 ounces of water daily.  Her recent pre-operative labs showed an A1c of 5.6. She experiences occasional nausea after taking Mounjaro , which she manages with Zofran  and ginger ale. She notes that her blood sugars fluctuate. No significant hunger between meals since restarting Mounjaro .     Challenges affecting patient progress: orthopedic problems, medical conditions or chronic pain affecting mobility and medical comorbidities.    Pharmacotherapy for weight management: She is currently taking Metformin  with diabetes as primary indication with adequate clinical response  and without side effects. and Monjauro with diabetes as the primary indication and obesity secondary with adequate clinical response  and without side effects..   Assessment and Plan   Treatment Plan For Obesity:  Recommended Dietary Goals  Tabitha [name] is currently in the action stage of change. As such, her goal is to continue weight management plan. She has agreed to: Given guidance in regards to implementing a low-carb strategy.  She was also given provided an ad libitum low-carb meal plan.  Behavioral Health and Counseling  We discussed the following behavioral modification strategies today: We reviewed the energy balance model and carbohydrate insulin   model for obesity with her and her husband today.  Additional education and resources provided today: None  Recommended Physical Activity Goals  Tabitha [name] has been advised to work up to 150 minutes of moderate intensity aerobic activity a week and strengthening exercises  2-3 times per week for cardiovascular health, weight loss maintenance and preservation of muscle mass.  She has agreed to :  Increase volume of physical activity to a goal of 240 minutes a week and Combine aerobic and strengthening exercises for efficiency and improved cardiometabolic health.  Medical Interventions and Pharmacotherapy  We discussed various medication options to help Tabitha [name] with her weight loss efforts and we both agreed to : Adequate clinical response to anti-obesity medication, continue current regimen  Associated Conditions Impacted by Obesity Treatment  Assessment & Plan Class 3 severe obesity with serious comorbidity and body mass index (BMI) of 40.0 to 44.9 in adult, unspecified obesity type (HCC) Weight: decrease of 13 lb (5.8%) over 11 months  Start: 05/24/2023 226 lb (102.5 kg)  End: 04/28/2024 213 lb (96.6 kg)   Class 3 obesity with diet-resistant features, characterized by difficulty losing weight despite a calorie deficit and adherence to a calorie-controlled diet. Currently on Mounjaro  5 mg weekly, which aids in appetite control and diabetes management.  Exercise is limited due to recent rotator cuff surgery, but cardiovascular activity has been increased. Consideration of diet-resistant obesity due to lack of weight loss despite a 500 calorie deficit. Consideration of carbohydrate insulin  model of obesity and potential benefits of a low-carb diet. - Continue Mounjaro  5 mg weekly. - Implement a low-carb diet , focusing on lean proteins and non-starchy vegetables. - Incorporate a meal replacement once daily for the most difficult meal. - Increase physical activity as recovery from surgery allows. - Monitor weight and dietary intake for four weeks. - Provided handout on managing weight during the holidays. Type 2 diabetes mellitus with other specified complication, without long-term current use of insulin  (HCC) Type 2 diabetes mellitus with good glycemic control,  evidenced by an A1c of 5.6. Blood sugars fluctuate, but overall control is satisfactory. Current management includes Mounjaro  and metformin . Discussion of the carbohydrate insulin  model of obesity and its implications for dietary management. - Continue Mounjaro  and metformin  as prescribed. - Monitor blood glucose levels regularly. - Maintain a low-carb diet to support glycemic control. OSA (obstructive sleep apnea) On CPAP with reported good compliance. Continue PAP therapy. Losing 15% or more of body weight may improve AHI.  She benefits from GLP-1 aided weight management. Drug-induced constipation Current management includes Miralax, with consideration of Dulcolax if needed. - Increase Miralax to 1-2 servings daily. - Ensure adequate hydration with at least 90 ounces of water daily. - Consider Dulcolax if constipation persists.   Recording duration 31 minutes       Objective   Physical Exam:  Blood pressure 122/78, pulse 70, temperature 97.7 F (36.5 C), height 5' 1 (1.549 m), weight 213 lb (96.6 kg), SpO2 98%. Body mass index is 40.25 kg/m.  General: She is overweight, cooperative, alert, well developed, and in no acute distress. PSYCH: Has normal mood, affect and thought process.   HEENT: EOMI, sclerae are anicteric. Lungs: Normal breathing effort, no conversational dyspnea. Extremities: No edema.  Neurologic: No gross sensory or motor deficits. No tremors or fasciculations noted.    Diagnostic Data Reviewed:  BMET    Component Value Date/Time   NA 138 03/19/2024 0834   NA 139 07/12/2023 0000   K 4.3 03/19/2024  0834   CL 104 03/19/2024 0834   CO2 22 03/19/2024 0834   GLUCOSE 174 (H) 03/19/2024 0834   BUN 25 (H) 03/19/2024 0834   BUN 15 07/12/2023 0000   CREATININE 0.80 03/19/2024 0834   CREATININE 0.72 04/04/2022 0000   CALCIUM 9.7 03/19/2024 0834   GFRNONAA >60 03/19/2024 0834   GFRNONAA 98 08/21/2019 0834   GFRAA 114 08/21/2019 0834   Lab Results   Component Value Date   HGBA1C 5.6 03/19/2024   HGBA1C 7.2 09/06/2011   Lab Results  Component Value Date   INSULIN  19.3 07/25/2023   Lab Results  Component Value Date   TSH 1.23 09/15/2022   CBC    Component Value Date/Time   WBC 8.3 03/19/2024 0834   RBC 4.88 03/19/2024 0834   HGB 14.2 03/19/2024 0834   HCT 44.3 03/19/2024 0834   PLT 187 03/19/2024 0834   MCV 90.8 03/19/2024 0834   MCH 29.1 03/19/2024 0834   MCHC 32.1 03/19/2024 0834   RDW 12.3 03/19/2024 0834   Iron Studies    Component Value Date/Time   FERRITIN 71 09/15/2022 0000   Lipid Panel     Component Value Date/Time   CHOL 183 07/25/2023 0754   TRIG 119 07/25/2023 0754   HDL 54 07/25/2023 0754   CHOLHDL 2.4 04/04/2022 0000   VLDL 25.0 09/25/2019 0920   LDLCALC 108 (H) 07/25/2023 0754   LDLCALC 87 04/04/2022 0000   LDLDIRECT 149 (H) 12/06/2011 1702   Hepatic Function Panel     Component Value Date/Time   PROT 7.0 04/04/2022 0000   ALBUMIN 4.5 07/12/2023 0000   AST 23 07/12/2023 0000   ALT 33 07/12/2023 0000   ALKPHOS 99 07/12/2023 0000   BILITOT 0.5 04/04/2022 0000      Component Value Date/Time   TSH 1.23 09/15/2022 0000   TSH 1.02 12/11/2018 1102   Nutritional Lab Results  Component Value Date   VD25OH 57.2 07/25/2023   VD25OH 38.8 09/15/2022    Medications: Outpatient Encounter Medications as of 04/28/2024  Medication Sig Note   ACCU-CHEK GUIDE test strip     acetaminophen  (TYLENOL ) 325 MG tablet Take 650 mg by mouth every 6 (six) hours as needed for moderate pain (pain score 4-6).    amphetamine -dextroamphetamine  (ADDERALL) 10 MG tablet Take 1 tablet by mouth daily as directed (Patient taking differently: Take 10 mg by mouth daily as needed (ADHD).)    calcipotriene  (DOVONOX) 0.005 % cream Apply topically 2 (two) times daily. Apply to ears and face until areas are clear (Patient taking differently: Apply 1 Application topically 2 (two) times daily as needed (psoriasis). Apply to  ears and face until areas are clear)    celecoxib  (CELEBREX ) 100 MG capsule Take 1 capsule (100 mg total) by mouth 2 (two) times daily. 03/18/2024: ON HOLD   cephALEXin (KEFLEX) 500 MG capsule Take 1 capsule (500 mg total) by mouth 2 (two) times daily for 7 days.    clobetasol  (TEMOVATE ) 0.05 % external solution Apply topically 2 (two) times daily (Patient taking differently: Apply 1 Application topically 2 (two) times daily as needed (psoriasis).)    clotrimazole -betamethasone  (LOTRISONE ) cream Apply to affected area(s) on skin 2 (two) times daily. (Patient taking differently: Apply 1 application  topically 2 (two) times daily as needed (psoriasis).)    Continuous Glucose Sensor (FREESTYLE LIBRE 3 PLUS SENSOR) MISC Change sensor every 15 days.    doxepin  (SINEQUAN ) 10 MG capsule Take 1 capsule (10 mg total) by mouth  at bedtime as needed.    estradiol (ESTRACE) 0.1 MG/GM vaginal cream Insert one gram vaginally every other day for 2 weeks.  Then insert one gram vaginally twice a week for maintenence.    glucose blood (FREESTYLE LITE) test strip Use to check blood sugar 3 times a day    guselkumab  (TREMFYA  PEN) 100 MG/ML pen Inject 100mg  subcutanously at week 4 then every 8 weeks as directed    lisdexamfetamine (VYVANSE ) 60 MG capsule Take 1 capsule (60 mg total) by mouth daily.    magnesium gluconate (MAGONATE) 500 (27 Mg) MG TABS tablet Take 500 mg by mouth in the morning and at bedtime. Magnesium gluconoate and citrate pt takes 1000mg  in pm for atrial fib    metFORMIN  (GLUCOPHAGE -XR) 500 MG 24 hr tablet Take 1 tablet (500 mg total) by mouth every evening.    Omega-3 Fatty Acids (FISH OIL) 1200 MG CAPS Take 1,200-2,400 mg by mouth See admin instructions. Take 2,400 mg by mouth in the morning and 1,200 mg in the evening 03/18/2024: ON HOLD   ondansetron  (ZOFRAN -ODT) 4 MG disintegrating tablet Take 1 tablet (4 mg total) by mouth every 8 (eight) hours as needed.    tirzepatide  (MOUNJARO ) 5 MG/0.5ML Pen  Inject 5 mg into the skin once a week.    [DISCONTINUED] desonide  (DESOWEN ) 0.05 % cream Apply 1 application topically 2 (two) times daily. (Patient taking differently: Apply 1 Application topically 2 (two) times daily as needed (psoriasis).)    [DISCONTINUED] guselkumab  (TREMFYA  PEN) 100 MG/ML pen Inject 100mg  subcutaneously at week 0 and week 4 as directed (Patient not taking: Reported on 03/18/2024)    [DISCONTINUED] metFORMIN  (GLUCOPHAGE -XR) 500 MG 24 hr tablet Take 1 tablet (500 mg total) by mouth every evening.    [DISCONTINUED] methocarbamol (ROBAXIN) 500 MG tablet Take 1 tablet (500 mg total) by mouth every 6 (six) hours as needed.    [DISCONTINUED] mupirocin  ointment (BACTROBAN ) 2 % Apply 1 Application topically 2 (two) times daily. (Patient taking differently: Apply 1 Application topically 2 (two) times daily as needed (psoriasis).)    [DISCONTINUED] nystatin -triamcinolone  ointment (MYCOLOG) Apply 1 Application topically 2 (two) times daily. Apply for 5 days then STOP (Patient not taking: Reported on 03/18/2024)    [DISCONTINUED] oxyCODONE  (ROXICODONE ) 5 MG immediate release tablet Take 1 tablet (5 mg total) by mouth every 4 (four) hours as needed.    [DISCONTINUED] tirzepatide  (MOUNJARO ) 7.5 MG/0.5ML Pen Inject 7.5 mg into the skin once a week. (Patient not taking: Reported on 04/28/2024) 03/18/2024: ON HOLD   No facility-administered encounter medications on file as of 04/28/2024.     Follow-Up   Return in about 4 weeks (around 05/26/2024) for For Weight Mangement with Dr. [email or web address].[email or web address] She was informed of the importance of frequent follow up visits to maximize her success with intensive lifestyle modifications for her multiple health conditions.  Attestation Statement   Reviewed by clinician on day of visit: allergies, medications, problem list, medical history, surgical history, family history, social history, and previous encounter notes.   I have spent 41 minutes in the care of the  patient today including: 4 minutes before the visit reviewing and preparing the chart. 31 minutes face-to-face assessing and reviewing listed medical problems as outlined in obesity care plan, providing nutritional and behavioral counseling on topics outlined in the obesity care plan, counseling regarding anti-obesity medication as outlined in obesity care plan, independently interpreting test results and goals of care, as described in assessment and plan, reviewing and discussing biometric information  and progress, and addressing husband's concerns regarding her weight loss journey 6 minutes after the visit updating chart and documentation of encounter.    Lucas Parker, MD

## 2024-04-28 NOTE — Assessment & Plan Note (Signed)
 Current management includes Miralax, with consideration of Dulcolax if needed. - Increase Miralax to 1-2 servings daily. - Ensure adequate hydration with at least 90 ounces of water daily. - Consider Dulcolax if constipation persists.

## 2024-04-28 NOTE — Assessment & Plan Note (Signed)
 Type 2 diabetes mellitus with good glycemic control, evidenced by an A1c of 5.6. Blood sugars fluctuate, but overall control is satisfactory. Current management includes Mounjaro  and metformin . Discussion of the carbohydrate insulin  model of obesity and its implications for dietary management. - Continue Mounjaro  and metformin  as prescribed. - Monitor blood glucose levels regularly. - Maintain a low-carb diet to support glycemic control.

## 2024-04-28 NOTE — Assessment & Plan Note (Signed)
 Weight: decrease of 13 lb (5.8%) over 11 months  Start: 05/24/2023 226 lb (102.5 kg)  End: 04/28/2024 213 lb (96.6 kg)   Class 3 obesity with diet-resistant features, characterized by difficulty losing weight despite a calorie deficit and adherence to a calorie-controlled diet. Currently on Mounjaro  5 mg weekly, which aids in appetite control and diabetes management.  Exercise is limited due to recent rotator cuff surgery, but cardiovascular activity has been increased. Consideration of diet-resistant obesity due to lack of weight loss despite a 500 calorie deficit. Consideration of carbohydrate insulin  model of obesity and potential benefits of a low-carb diet. - Continue Mounjaro  5 mg weekly. - Implement a low-carb diet , focusing on lean proteins and non-starchy vegetables. - Incorporate a meal replacement once daily for the most difficult meal. - Increase physical activity as recovery from surgery allows. - Monitor weight and dietary intake for four weeks. - Provided handout on managing weight during the holidays.

## 2024-04-29 ENCOUNTER — Ambulatory Visit: Admitting: Family Medicine

## 2024-04-30 DIAGNOSIS — F4322 Adjustment disorder with anxiety: Secondary | ICD-10-CM | POA: Diagnosis not present

## 2024-04-30 DIAGNOSIS — M25611 Stiffness of right shoulder, not elsewhere classified: Secondary | ICD-10-CM | POA: Diagnosis not present

## 2024-04-30 DIAGNOSIS — Z9889 Other specified postprocedural states: Secondary | ICD-10-CM | POA: Insufficient documentation

## 2024-04-30 DIAGNOSIS — M6281 Muscle weakness (generalized): Secondary | ICD-10-CM | POA: Diagnosis not present

## 2024-05-01 ENCOUNTER — Other Ambulatory Visit: Payer: Self-pay

## 2024-05-01 NOTE — Progress Notes (Signed)
 [name] [address] [place] [address] Phone: [phone] Subjective:   Tabitha [name], am serving as a scribe for Dr. Arthea Claudene.  I'm seeing this patient by the request  of:  Alvia Bring, DO  CC: Back and neck pain follow-up  [name]  Tabitha [name] is a 58 y.o. female coming in with complaint of back and neck pain. OMT 02/12/2024. Shoulder surgery on 03/27/2024.  Planning on returning to work December 1.  Patient states that she is having pain in traps and lumbar spine. Otherwise has been doing ok.   Medications patient has been prescribed: None  Taking:         Reviewed prior external information including notes and imaging from previsou exam, outside providers and external EMR if available.   As well as notes that were available from care everywhere and other healthcare systems.  Past medical history, social, surgical and family history all reviewed in electronic medical record.  No pertanent information unless stated regarding to the chief complaint.   Past Medical History:  Diagnosis Date   A-fib [facility])    Alcohol abuse    Allergy 15+ Years ago   Chocolate seasonal fall allergies   Anxiety    Arrhythmia May 2020   Arthritis    Back pain    Clotting disorder    chronic nose bleeds since childhood   De Quervain's disease (tenosynovitis)    Depression Previous history not currently being treated   Dermatophytosis, scalp    Diabetes mellitus    Drug use    Dysrhythmia    afib   Edema of both lower extremities    Fatty liver    Female infertility    Gallbladder problem    Hypercholesteremia    Joint pain    Obesity    Osteoarthritis    Palpitations    PCOS (polycystic ovarian syndrome)    Pneumonia    Polycystic ovarian disease    PONV (postoperative nausea and vomiting)    Psoriatic arthritis (HCC)    Sleep apnea CPAP therapy started in [employer]   SOB (shortness of breath)    Vitamin D  deficiency     Wears glasses     Allergies  Allergen Reactions   Chocolate Anaphylaxis   Corn-Containing Products Nausea Only    Fatigue, body aches, upset stomach,   Food     chocolate   Gluten Meal    Latex Rash   Methotrexate  Rash     Review of Systems:  No headache, visual changes, nausea, vomiting, diarrhea, constipation, dizziness, abdominal pain, skin rash, fevers, chills, night sweats, weight loss, swollen lymph nodes, body aches, joint swelling, chest pain, shortness of breath, mood changes. POSITIVE muscle aches  Objective  Blood pressure 124/64, pulse (!) 57, height 5' 1 (1.549 m), SpO2 98%.   General: No apparent distress alert and oriented x3 mood and affect normal, dressed appropriately.  HEENT: Pupils equal, extraocular movements intact  Respiratory: Patient's speak in full sentences and does not appear short of breath  Cardiovascular: No lower extremity edema, non tender, no erythema  Gait MSK:  Back does have significant tightness noted mostly at the cervical thoracic, thoracic lumbar and lumbosacral areas.  Seems to be bilaterally and no midline tenderness.  Tightness of the neck with sidebending.  Osteopathic findings  C2 flexed rotated and side bent right C5 flexed rotated and side bent left T5 extended rotated and side bent right inhaled rib T9 extended  rotated and side bent left L2 flexed rotated and side bent right Sacrum right on right       Assessment and Plan:  Psoriatic arthritis (HCC) Known psoriatic arthritis but has been doing relatively well.  Is now 6 weeks post rotator cuff repair.  Hopefully will continue to make improvement.  Follow-up with me again in 6 to 12 weeks  Low back pain Compensation with patient not being able to be quite as active as she normally is. When patient is released to do more activity I think she will make improvement.  Will start working on her weight loss again.  Follow-up again in 6 to 12 weeks.    Nonallopathic  problems  Decision today to treat with OMT was based on Physical Exam  After verbal consent patient was treated with HVLA, ME, FPR techniques in cervical, rib, thoracic, lumbar, and sacral  areas avoided HVLA on the right side of the thoracic spine secondary to patient's recent surgery  Patient tolerated the procedure well with improvement in symptoms  Patient given exercises, stretches and lifestyle modifications  See medications in patient instructions if given  Patient will follow up in 4-8 weeks     The above documentation has been reviewed and is accurate and complete Arthea CHRISTELLA Sharps, DO         Note: This dictation was prepared with Dragon dictation along with smaller phrase technology. Any transcriptional errors that result from this process are unintentional.

## 2024-05-05 ENCOUNTER — Other Ambulatory Visit (HOSPITAL_COMMUNITY): Payer: Self-pay

## 2024-05-05 DIAGNOSIS — M25611 Stiffness of right shoulder, not elsewhere classified: Secondary | ICD-10-CM | POA: Diagnosis not present

## 2024-05-05 DIAGNOSIS — M6281 Muscle weakness (generalized): Secondary | ICD-10-CM | POA: Diagnosis not present

## 2024-05-06 ENCOUNTER — Ambulatory Visit (INDEPENDENT_AMBULATORY_CARE_PROVIDER_SITE_OTHER): Admitting: Family Medicine

## 2024-05-06 ENCOUNTER — Encounter: Payer: Self-pay | Admitting: Family Medicine

## 2024-05-06 VITALS — BP 124/64 | HR 57 | Ht 61.0 in

## 2024-05-06 DIAGNOSIS — M9908 Segmental and somatic dysfunction of rib cage: Secondary | ICD-10-CM

## 2024-05-06 DIAGNOSIS — M9902 Segmental and somatic dysfunction of thoracic region: Secondary | ICD-10-CM | POA: Diagnosis not present

## 2024-05-06 DIAGNOSIS — L4050 Arthropathic psoriasis, unspecified: Secondary | ICD-10-CM

## 2024-05-06 DIAGNOSIS — G8929 Other chronic pain: Secondary | ICD-10-CM

## 2024-05-06 DIAGNOSIS — M545 Low back pain, unspecified: Secondary | ICD-10-CM

## 2024-05-06 DIAGNOSIS — M9904 Segmental and somatic dysfunction of sacral region: Secondary | ICD-10-CM | POA: Diagnosis not present

## 2024-05-06 DIAGNOSIS — M9901 Segmental and somatic dysfunction of cervical region: Secondary | ICD-10-CM

## 2024-05-06 DIAGNOSIS — M9903 Segmental and somatic dysfunction of lumbar region: Secondary | ICD-10-CM

## 2024-05-06 NOTE — Patient Instructions (Addendum)
 Doing amazing See you again in the New Year

## 2024-05-06 NOTE — Assessment & Plan Note (Signed)
 Known psoriatic arthritis but has been doing relatively well.  Is now 6 weeks post rotator cuff repair.  Hopefully will continue to make improvement.  Follow-up with me again in 6 to 12 weeks

## 2024-05-06 NOTE — Assessment & Plan Note (Signed)
 Compensation with patient not being able to be quite as active as she normally is. When patient is released to do more activity I think she will make improvement.  Will start working on her weight loss again.  Follow-up again in 6 to 12 weeks.

## 2024-05-07 DIAGNOSIS — M25611 Stiffness of right shoulder, not elsewhere classified: Secondary | ICD-10-CM | POA: Diagnosis not present

## 2024-05-07 DIAGNOSIS — M6281 Muscle weakness (generalized): Secondary | ICD-10-CM | POA: Diagnosis not present

## 2024-05-14 DIAGNOSIS — F4322 Adjustment disorder with anxiety: Secondary | ICD-10-CM | POA: Diagnosis not present

## 2024-05-15 DIAGNOSIS — M6281 Muscle weakness (generalized): Secondary | ICD-10-CM | POA: Diagnosis not present

## 2024-05-15 DIAGNOSIS — M25611 Stiffness of right shoulder, not elsewhere classified: Secondary | ICD-10-CM | POA: Diagnosis not present

## 2024-05-20 DIAGNOSIS — M25611 Stiffness of right shoulder, not elsewhere classified: Secondary | ICD-10-CM | POA: Diagnosis not present

## 2024-05-20 DIAGNOSIS — M6281 Muscle weakness (generalized): Secondary | ICD-10-CM | POA: Diagnosis not present

## 2024-05-22 DIAGNOSIS — M6281 Muscle weakness (generalized): Secondary | ICD-10-CM | POA: Diagnosis not present

## 2024-05-22 DIAGNOSIS — M25611 Stiffness of right shoulder, not elsewhere classified: Secondary | ICD-10-CM | POA: Diagnosis not present

## 2024-05-23 ENCOUNTER — Other Ambulatory Visit (HOSPITAL_COMMUNITY): Payer: Self-pay

## 2024-05-23 ENCOUNTER — Ambulatory Visit: Admitting: Family Medicine

## 2024-05-23 MED ORDER — LISDEXAMFETAMINE DIMESYLATE 60 MG PO CAPS
60.0000 mg | ORAL_CAPSULE | Freq: Every day | ORAL | 0 refills | Status: AC
  Filled 2024-05-23: qty 30, 30d supply, fill #0

## 2024-05-23 MED ORDER — AMPHETAMINE-DEXTROAMPHETAMINE 10 MG PO TABS
10.0000 mg | ORAL_TABLET | Freq: Every day | ORAL | 0 refills | Status: AC
  Filled 2024-05-23: qty 30, 30d supply, fill #0

## 2024-05-26 ENCOUNTER — Other Ambulatory Visit (HOSPITAL_COMMUNITY): Payer: Self-pay

## 2024-05-26 DIAGNOSIS — M25611 Stiffness of right shoulder, not elsewhere classified: Secondary | ICD-10-CM | POA: Diagnosis not present

## 2024-05-26 DIAGNOSIS — M6281 Muscle weakness (generalized): Secondary | ICD-10-CM | POA: Diagnosis not present

## 2024-05-28 ENCOUNTER — Encounter (INDEPENDENT_AMBULATORY_CARE_PROVIDER_SITE_OTHER): Payer: Self-pay | Admitting: Internal Medicine

## 2024-05-28 ENCOUNTER — Ambulatory Visit (INDEPENDENT_AMBULATORY_CARE_PROVIDER_SITE_OTHER): Admitting: Internal Medicine

## 2024-05-28 VITALS — BP 119/81 | HR 94 | Temp 98.1°F | Ht 61.0 in | Wt 211.0 lb

## 2024-05-28 DIAGNOSIS — Z7985 Long-term (current) use of injectable non-insulin antidiabetic drugs: Secondary | ICD-10-CM

## 2024-05-28 DIAGNOSIS — R638 Other symptoms and signs concerning food and fluid intake: Secondary | ICD-10-CM

## 2024-05-28 DIAGNOSIS — E1169 Type 2 diabetes mellitus with other specified complication: Secondary | ICD-10-CM | POA: Diagnosis not present

## 2024-05-28 DIAGNOSIS — E66813 Obesity, class 3: Secondary | ICD-10-CM

## 2024-05-28 DIAGNOSIS — Z6841 Body Mass Index (BMI) 40.0 and over, adult: Secondary | ICD-10-CM

## 2024-05-28 DIAGNOSIS — F4322 Adjustment disorder with anxiety: Secondary | ICD-10-CM | POA: Diagnosis not present

## 2024-05-28 NOTE — Progress Notes (Signed)
 Office: [phone]  /  Fax: [phone]  Weight Summary and Body Composition Analysis (BIA)  Vitals Temp: 98.1 F (36.7 C) BP: 119/81 Pulse Rate: 94 SpO2: 97 %   Anthropometric Measurements Height: 5' 1 (1.549 m) Weight: 211 lb (95.7 kg) BMI (Calculated): 39.89 Weight at Last Visit: 213lb Weight Lost Since Last Visit: 2lb Weight Gained Since Last Visit: 0lb Starting Weight: 218lb Total Weight Loss (lbs): 7 lb (3.175 kg) Peak Weight: 280lb   Body Composition  Body Fat %: 47.5 % Fat Mass (lbs): 100.6 lbs Muscle Mass (lbs): 105.4 lbs Total Body Water (lbs): 77.8 lbs Visceral Fat Rating : 15    RMR: 1886  Today's Visit #: 12  Starting Date: 07/12/23   Subjective   Chief Complaint: Obesity  Interval History  Discussed the use of AI scribe software for clinical note transcription with the patient, who gave verbal consent to proceed.  History of Present Illness Tabitha [name] is a 58 year old female who presents for weight management consultation.  Since last office visit she has lost 2 pounds.  She is following a 1200-calorie nutrition plan 80 to 90% of the time.  She does tracking and journaling.  She has experienced weight fluctuations, with her highest recorded weight being 280 pounds. Since 2020, she has lost approximately 29 pounds, reducing her weight from 240 pounds to 211 pounds. Her weight loss journey has been challenging, influenced by life events and circumstances. She is currently on Mounjaro , which helps manage her appetite, especially when her Vyvanse  wears off. She reports never feeling full unless she overeats, leading to discomfort. She monitors her portions and finds that Mounjaro  aids in controlling her eating habits.  She underwent shoulder surgery and is working on regaining her range of motion through physical therapy. She experiences muscle atrophy due to limited upper body activity but is gradually increasing her physical activity,  focusing on lower body exercises.  She takes Vyvanse  for ADHD, which she believes contributes to a constant state of anxiety and heightened stress responses. She has noticed difficulty sleeping and increased blood sugar levels when not taking Vyvanse . She is considering discussing her medication with her healthcare provider due to these concerns.  Her social history includes managing work-related stress and having a therapist. She is mindful of her eating habits, particularly during holidays, and has successfully managed her portions during recent celebrations.     Challenges affecting patient progress: orthopedic problems, medical conditions or chronic pain affecting mobility.    Pharmacotherapy for weight management: She is currently taking Monjauro with diabetes as the primary indication and obesity secondary with adequate clinical response  and without side effects..   Assessment and Plan   Treatment Plan For Obesity:  Recommended Dietary Goals  [name] is currently in the action stage of change. As such, her goal is to continue weight management plan. She has agreed to: continue current plan  Behavioral Health and Counseling  We discussed the following behavioral modification strategies today: work on tracking and journaling calories using tracking application, continue to work on maintaining a reduced calorie state, getting the recommended amount of protein, incorporating whole foods, making healthy choices, staying well hydrated and practicing mindfulness when eating., and increase protein intake, fibrous foods (25 grams per day for women, 30 grams for men) and water to improve satiety and decrease hunger signals. .  Additional education and resources provided today: None  Recommended Physical Activity Goals  [name] has been advised to work up  to 150 minutes of moderate intensity aerobic activity a week and strengthening exercises 2-3 times per week for cardiovascular health,  weight loss maintenance and preservation of muscle mass.  She has agreed to :  Think about enjoyable ways to increase daily physical activity and overcoming barriers to exercise and Continue to gradually increase the amount and intensity of exercise routine  Medical Interventions and Pharmacotherapy  We discussed various medication options to help [name] with her weight loss efforts and we both agreed to : Adequate clinical response to anti-obesity medication, continue current regimen and we will increase her Mounjaro  to 7.5 mg at the next refill patient has received a 90-day supply of the 5 mg.  Associated Conditions Impacted by Obesity Treatment  Assessment & Plan Class 3 severe obesity with serious comorbidity and body mass index (BMI) of 40.0 to 44.9 in adult, unspecified obesity type (HCC) Type 2 diabetes mellitus with other specified complication, without long-term current use of insulin  (HCC) Abnormal food appetite Obesity with gastrointestinal and neurological phenotype. Obesity with impaired satiety likely due to rapid gastric emptying. Reports not feeling full unless overeating. No evidence of slowed metabolism. Potential overlap of neurological and emotional hunger as well. Vyvanse  and Mounjaro  have been beneficial in managing hunger and weight. Discussed potential impact of Vyvanse  on cortisol levels and anxiety.  Discussed the role of GLP-1 agonists in delaying gastric emptying and reducing hunger. - Increase Mounjaro  to 7.5 mg after current supply of 5 mg is exhausted for diabetes management and weight - Implement a high-protein meal plan with pre-meal protein snacks to delay gastric emptying and improve satiety. - Monitor weight and hunger levels with increased Mounjaro  dosage. - Discuss potential tapering of Vyvanse  with attention deficit specialist to assess impact on anxiety and weight management. - Consider genetic testing for obesity phenotypes to tailor treatment further.           Objective   Physical Exam:  Blood pressure 119/81, pulse 94, temperature 98.1 F (36.7 C), height 5' 1 (1.549 m), weight 211 lb (95.7 kg), SpO2 97%. Body mass index is 39.87 kg/m.  General: She is overweight, cooperative, alert, well developed, and in no acute distress. PSYCH: Has normal mood, affect and thought process.   HEENT: EOMI, sclerae are anicteric. Lungs: Normal breathing effort, no conversational dyspnea. Extremities: No edema.  Neurologic: No gross sensory or motor deficits. No tremors or fasciculations noted.    Diagnostic Data Reviewed:  BMET    Component Value Date/Time   NA 138 03/19/2024 0834   NA 139 07/12/2023 0000   K 4.3 03/19/2024 0834   CL 104 03/19/2024 0834   CO2 22 03/19/2024 0834   GLUCOSE 174 (H) 03/19/2024 0834   BUN 25 (H) 03/19/2024 0834   BUN 15 07/12/2023 0000   CREATININE 0.80 03/19/2024 0834   CREATININE 0.72 04/04/2022 0000   CALCIUM 9.7 03/19/2024 0834   GFRNONAA >60 03/19/2024 0834   GFRNONAA 98 08/21/2019 0834   GFRAA 114 08/21/2019 0834   Lab Results  Component Value Date   HGBA1C 5.6 03/19/2024   HGBA1C 7.2 09/06/2011   Lab Results  Component Value Date   INSULIN  19.3 07/25/2023   Lab Results  Component Value Date   TSH 1.23 09/15/2022   CBC    Component Value Date/Time   WBC 8.3 03/19/2024 0834   RBC 4.88 03/19/2024 0834   HGB 14.2 03/19/2024 0834   HCT 44.3 03/19/2024 0834   PLT 187 03/19/2024 0834   MCV 90.8  03/19/2024 0834   MCH 29.1 03/19/2024 0834   MCHC 32.1 03/19/2024 0834   RDW 12.3 03/19/2024 0834   Iron Studies    Component Value Date/Time   FERRITIN 71 09/15/2022 0000   Lipid Panel     Component Value Date/Time   CHOL 183 07/25/2023 0754   TRIG 119 07/25/2023 0754   HDL 54 07/25/2023 0754   CHOLHDL 2.4 04/04/2022 0000   VLDL 25.0 09/25/2019 0920   LDLCALC 108 (H) 07/25/2023 0754   LDLCALC 87 04/04/2022 0000   LDLDIRECT 149 (H) 12/06/2011 1702   Hepatic Function Panel      Component Value Date/Time   PROT 7.0 04/04/2022 0000   ALBUMIN 4.5 07/12/2023 0000   AST 23 07/12/2023 0000   ALT 33 07/12/2023 0000   ALKPHOS 99 07/12/2023 0000   BILITOT 0.5 04/04/2022 0000      Component Value Date/Time   TSH 1.23 09/15/2022 0000   TSH 1.02 12/11/2018 1102   Nutritional Lab Results  Component Value Date   VD25OH 57.2 07/25/2023   VD25OH 38.8 09/15/2022    Medications: Outpatient Encounter Medications as of 05/28/2024  Medication Sig Note   ACCU-CHEK GUIDE test strip     acetaminophen  (TYLENOL ) 325 MG tablet Take 650 mg by mouth every 6 (six) hours as needed for moderate pain (pain score 4-6).    amphetamine -dextroamphetamine  (ADDERALL) 10 MG tablet Take 1 tablet by mouth daily as directed (Patient taking differently: Take 10 mg by mouth daily as needed (ADHD).)    amphetamine -dextroamphetamine  (ADDERALL) 10 MG tablet Take 1 tablet (10 mg total) by mouth daily as directed.    calcipotriene  (DOVONOX) 0.005 % cream Apply topically 2 (two) times daily. Apply to ears and face until areas are clear (Patient taking differently: Apply 1 Application topically 2 (two) times daily as needed (psoriasis). Apply to ears and face until areas are clear)    celecoxib  (CELEBREX ) 100 MG capsule Take 1 capsule (100 mg total) by mouth 2 (two) times daily. 03/18/2024: ON HOLD   Continuous Glucose Sensor (FREESTYLE LIBRE 3 PLUS SENSOR) MISC Change sensor every 15 days.    doxepin  (SINEQUAN ) 10 MG capsule Take 1 capsule (10 mg total) by mouth at bedtime as needed.    estradiol  (ESTRACE ) 0.1 MG/GM vaginal cream Insert one gram vaginally every other day for 2 weeks.  Then insert one gram vaginally twice a week for maintenence.    glucose blood (FREESTYLE LITE) test strip Use to check blood sugar 3 times a day    guselkumab  (TREMFYA  PEN) 100 MG/ML pen Inject 100mg  subcutanously at week 4 then every 8 weeks as directed    lisdexamfetamine  (VYVANSE ) 60 MG capsule Take 1 capsule (60 mg  total) by mouth daily.    magnesium gluconate (MAGONATE) 500 (27 Mg) MG TABS tablet Take 500 mg by mouth in the morning and at bedtime. Magnesium gluconoate and citrate pt takes 1000mg  in pm for atrial fib    metFORMIN  (GLUCOPHAGE -XR) 500 MG 24 hr tablet Take 1 tablet (500 mg total) by mouth every evening.    Omega-3 Fatty Acids (FISH OIL) 1200 MG CAPS Take 1,200-2,400 mg by mouth See admin instructions. Take 2,400 mg by mouth in the morning and 1,200 mg in the evening 03/18/2024: ON HOLD   ondansetron  (ZOFRAN -ODT) 4 MG disintegrating tablet Take 1 tablet (4 mg total) by mouth every 8 (eight) hours as needed.    tirzepatide  (MOUNJARO ) 5 MG/0.5ML Pen Inject 5 mg into the skin once a week.  clobetasol  (TEMOVATE ) 0.05 % external solution Apply topically 2 (two) times daily (Patient not taking: Reported on 05/28/2024)    clotrimazole -betamethasone  (LOTRISONE ) cream Apply to affected area(s) on skin 2 (two) times daily. (Patient taking differently: Apply 1 application  topically 2 (two) times daily as needed (psoriasis).)    No facility-administered encounter medications on file as of 05/28/2024.     Follow-Up   No follow-ups on file.SABRA She was informed of the importance of frequent follow up visits to maximize her success with intensive lifestyle modifications for her multiple health conditions.  Attestation Statement   Reviewed by clinician on day of visit: allergies, medications, problem list, medical history, surgical history, family history, social history, and previous encounter notes.     Lucas Parker, MD

## 2024-05-28 NOTE — Assessment & Plan Note (Signed)
 Obesity with gastrointestinal and neurological phenotype. Obesity with impaired satiety likely due to rapid gastric emptying. Reports not feeling full unless overeating. No evidence of slowed metabolism. Potential overlap of neurological and emotional hunger as well. Vyvanse  and Mounjaro  have been beneficial in managing hunger and weight. Discussed potential impact of Vyvanse  on cortisol levels and anxiety.  Discussed the role of GLP-1 agonists in delaying gastric emptying and reducing hunger. - Increase Mounjaro  to 7.5 mg after current supply of 5 mg is exhausted for diabetes management and weight - Implement a high-protein meal plan with pre-meal protein snacks to delay gastric emptying and improve satiety. - Monitor weight and hunger levels with increased Mounjaro  dosage. - Discuss potential tapering of Vyvanse  with attention deficit specialist to assess impact on anxiety and weight management. - Consider genetic testing for obesity phenotypes to tailor treatment further.

## 2024-05-29 DIAGNOSIS — M6281 Muscle weakness (generalized): Secondary | ICD-10-CM | POA: Diagnosis not present

## 2024-05-29 DIAGNOSIS — M25611 Stiffness of right shoulder, not elsewhere classified: Secondary | ICD-10-CM | POA: Diagnosis not present

## 2024-06-02 ENCOUNTER — Encounter: Payer: Self-pay | Admitting: Family Medicine

## 2024-06-02 DIAGNOSIS — M150 Primary generalized (osteo)arthritis: Secondary | ICD-10-CM | POA: Diagnosis not present

## 2024-06-02 DIAGNOSIS — Z7960 Long term (current) use of unspecified immunomodulators and immunosuppressants: Secondary | ICD-10-CM | POA: Diagnosis not present

## 2024-06-02 DIAGNOSIS — L409 Psoriasis, unspecified: Secondary | ICD-10-CM | POA: Diagnosis not present

## 2024-06-02 DIAGNOSIS — R5382 Chronic fatigue, unspecified: Secondary | ICD-10-CM | POA: Diagnosis not present

## 2024-06-02 DIAGNOSIS — L4050 Arthropathic psoriasis, unspecified: Secondary | ICD-10-CM | POA: Diagnosis not present

## 2024-06-03 ENCOUNTER — Other Ambulatory Visit (HOSPITAL_COMMUNITY): Payer: Self-pay

## 2024-06-03 ENCOUNTER — Encounter (HOSPITAL_COMMUNITY): Payer: Self-pay

## 2024-06-03 ENCOUNTER — Telehealth: Admitting: Physician Assistant

## 2024-06-03 ENCOUNTER — Encounter (INDEPENDENT_AMBULATORY_CARE_PROVIDER_SITE_OTHER): Payer: Self-pay

## 2024-06-03 DIAGNOSIS — J019 Acute sinusitis, unspecified: Secondary | ICD-10-CM

## 2024-06-03 DIAGNOSIS — B9689 Other specified bacterial agents as the cause of diseases classified elsewhere: Secondary | ICD-10-CM

## 2024-06-03 MED ORDER — AMOXICILLIN-POT CLAVULANATE 875-125 MG PO TABS
1.0000 | ORAL_TABLET | Freq: Two times a day (BID) | ORAL | 0 refills | Status: AC
  Filled 2024-06-03: qty 14, 7d supply, fill #0

## 2024-06-03 NOTE — Progress Notes (Signed)
 E-Visit for Sinus Problems  We are sorry that you are not feeling well.  Here is how we plan to help!  Based on what you have shared with me it looks like you have sinusitis.  Sinusitis is inflammation and infection in the sinus cavities of the head.  Based on your presentation I believe you most likely have Acute Bacterial Sinusitis.  This is an infection caused by bacteria and is treated with antibiotics. I have prescribed Augmentin  875mg /125mg  one tablet twice daily with food, for 7 days.   You may use an oral decongestant such as Mucinex D or if you have glaucoma or high blood pressure use plain Mucinex. Saline nasal spray help and can safely be used as often as needed for congestion.  If you develop worsening sinus pain, fever or notice severe headache and vision changes, or if symptoms are not better after completion of antibiotic, please schedule an appointment with a health care provider.    Sinus infections are not as easily transmitted as other respiratory infection, however we still recommend that you avoid close contact with loved ones, especially the very young and elderly.  Remember to wash your hands thoroughly throughout the day as this is the number one way to prevent the spread of infection!  Home Care: Only take medications as instructed by your medical team. Complete the entire course of an antibiotic. Do not take these medications with alcohol. A steam or ultrasonic humidifier can help congestion.  You can place a towel over your head and breathe in the steam from hot water coming from a faucet. Avoid close contacts especially the very young and the elderly. Cover your mouth when you cough or sneeze. Always remember to wash your hands.  Get Help Right Away If: You develop worsening fever or sinus pain. You develop a severe head ache or visual changes. Your symptoms persist after you have completed your treatment plan.  Make sure you Understand these  instructions. Will watch your condition. Will get help right away if you are not doing well or get worse.  Your e-visit answers were reviewed by a board certified advanced clinical practitioner to complete your personal care plan.  Depending on the condition, your plan could have included both over the counter or prescription medications.  If there is a problem please reply  once you have received a response from your provider.  Your safety is important to us .  If you have drug allergies check your prescription carefully.    You can use MyChart to ask questions about today's visit, request a non-urgent call back, or ask for a work or school excuse for 24 hours related to this e-Visit. If it has been greater than 24 hours you will need to follow up with your provider, or enter a new e-Visit to address those concerns.  You will get an e-mail in the next two days asking about your experience.  I hope that your e-visit has been valuable and will speed your recovery. Thank you for using e-visits.  I have spent 5 minutes in review of e-visit questionnaire, review and updating patient chart, medical decision making and response to patient.   Ashford Clouse, PA-C

## 2024-06-04 ENCOUNTER — Other Ambulatory Visit (HOSPITAL_COMMUNITY): Payer: Self-pay

## 2024-06-10 DIAGNOSIS — Z79899 Other long term (current) drug therapy: Secondary | ICD-10-CM | POA: Diagnosis not present

## 2024-06-10 DIAGNOSIS — M25611 Stiffness of right shoulder, not elsewhere classified: Secondary | ICD-10-CM | POA: Diagnosis not present

## 2024-06-10 DIAGNOSIS — M6281 Muscle weakness (generalized): Secondary | ICD-10-CM | POA: Diagnosis not present

## 2024-06-10 DIAGNOSIS — F902 Attention-deficit hyperactivity disorder, combined type: Secondary | ICD-10-CM | POA: Diagnosis not present

## 2024-06-11 ENCOUNTER — Encounter: Payer: Self-pay | Admitting: Family Medicine

## 2024-06-11 ENCOUNTER — Other Ambulatory Visit (HOSPITAL_COMMUNITY): Payer: Self-pay

## 2024-06-11 ENCOUNTER — Ambulatory Visit (INDEPENDENT_AMBULATORY_CARE_PROVIDER_SITE_OTHER): Admitting: Family Medicine

## 2024-06-11 VITALS — BP 133/76 | HR 82 | Temp 98.1°F | Ht 61.0 in | Wt 211.0 lb

## 2024-06-11 DIAGNOSIS — J0100 Acute maxillary sinusitis, unspecified: Secondary | ICD-10-CM | POA: Diagnosis not present

## 2024-06-11 DIAGNOSIS — Z7984 Long term (current) use of oral hypoglycemic drugs: Secondary | ICD-10-CM | POA: Diagnosis not present

## 2024-06-11 DIAGNOSIS — E785 Hyperlipidemia, unspecified: Secondary | ICD-10-CM

## 2024-06-11 DIAGNOSIS — E0865 Diabetes mellitus due to underlying condition with hyperglycemia: Secondary | ICD-10-CM

## 2024-06-11 DIAGNOSIS — I480 Paroxysmal atrial fibrillation: Secondary | ICD-10-CM | POA: Diagnosis not present

## 2024-06-11 DIAGNOSIS — E1169 Type 2 diabetes mellitus with other specified complication: Secondary | ICD-10-CM

## 2024-06-11 MED ORDER — DOXYCYCLINE HYCLATE 100 MG PO TABS
100.0000 mg | ORAL_TABLET | Freq: Two times a day (BID) | ORAL | 0 refills | Status: AC
  Filled 2024-06-11: qty 20, 10d supply, fill #0

## 2024-06-11 NOTE — Assessment & Plan Note (Signed)
 Recently completed course of augmentin .  Overall seems to be doing better, but has not fully reoslved.  Continue supportive care.  I did send doxycycline  over and instructed on when to use this if symptoms do return since our clinic will be closed tomorrow.  Red flags reviewed.  F/u as needed.

## 2024-06-11 NOTE — Progress Notes (Signed)
 " FRANCESCA STROME - 58 y.o. female MRN [id number]  Date of birth: 08-Jul-1965  Subjective Chief Complaint  Patient presents with   Headache    With sinus pressure behind the eyes after completing antibiotic    HPI TAINA LANDRY is a 58 y.o. female here today with complaint of sinus pressure and pain.  Symptoms started a little over a week ago.  Seen via E-visit and prescribed augmentin .  She had improvement with this but didn't feel like it full resolved.  Finished last tablet 2 days ago.  Since then she does feel that her symptoms have actually continued to improve.  Some pressure but sinus pain is better.  Denies headache, fever or chills.  Cough improved as well.   ROS:  A comprehensive ROS was completed and negative except as noted per HPI  Allergies[1]  Past Medical History:  Diagnosis Date   A-fib (HCC)    Alcohol abuse    Allergy 15+ Years ago   Chocolate seasonal fall allergies   Anemia    Anxiety    Arrhythmia May 2020   Arthritis    Asthma    Back pain    Clotting disorder    chronic nose bleeds since childhood   De Quervain's disease (tenosynovitis)    Depression Previous history not currently being treated   Dermatophytosis, scalp    Diabetes mellitus    Drug use    Dysrhythmia    afib   Edema of both lower extremities    Fatty liver    Female infertility    Gallbladder problem    Hypercholesteremia    Joint pain    Obesity    Osteoarthritis    Palpitations    PCOS (polycystic ovarian syndrome)    Pneumonia    Polycystic ovarian disease    PONV (postoperative nausea and vomiting)    Psoriatic arthritis (HCC)    Sleep apnea CPAP therapy started in [employer]   SOB (shortness of breath)    Vitamin D  deficiency    Wears glasses     Past Surgical History:  Procedure Laterality Date   BREAST BIOPSY Right    CARPAL TUNNEL RELEASE     COLONOSCOPY     DE QUERVAIN'S RELEASE  2012   left   KNEE ARTHROPLASTY     KNEE ARTHROSCOPY Right 05/06/2013   Procedure:  RIGHT KNEE ARTHROSCOPY ;  Surgeon: Maude KANDICE Herald, MD;  Location: [facility];  Service: Orthopedics;  Laterality: Right;  partial lateral minisectomy and chondroplasty   left rotator cuff surgery      SHOULDER ARTHROSCOPY WITH ROTATOR CUFF REPAIR Right 03/27/2024   Procedure: ARTHROSCOPY, SHOULDER, WITH ROTATOR CUFF REPAIR;  Surgeon: Dozier Soulier, MD;  Location: [facility];  Service: Orthopedics;  Laterality: Right;   SUBACROMIAL DECOMPRESSION Right 03/27/2024   Procedure: DECOMPRESSION, SUBACROMIAL SPACE;  Surgeon: Dozier Soulier, MD;  Location: [facility];  Service: Orthopedics;  Laterality: Right;   WISDOM TOOTH EXTRACTION     WRIST SURGERY  2001   carpel tunnel -rt    Social History   Socioeconomic History   Marital status: Married    Spouse name: Carlin   Number of children: Not on file   Years of education: Not on file   Highest education level: Master's degree (e.g., MA, MS, MEng, MEd, MSW, MBA)  Occupational History   Occupation: Academic Librarian: [employer]    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   Vaping Use   Vaping status: Never Used  Substance and Sexual Activity   Alcohol use: Yes    Alcohol/week: 2.0 - 3.0 standard drinks of alcohol    Types: 2 - 3 Shots of liquor per week    Comment: rare   Drug use: Never    Comment: Rare useage 1-2 x year   Sexual activity: Yes    Partners: Male    Birth control/protection: Post-menopausal, None    Comment: married  Other Topics Concern   Not on file  Social History Narrative   Not on file   Social Drivers of  Health   Tobacco Use: Medium Risk (06/02/2024)   Received from [facility]   Patient History    Smoking Tobacco Use: Former    Smokeless Tobacco Use: Never    Passive Exposure: Past  Physicist, Medical Strain: Low Risk (06/10/2024)   Overall Financial Resource Strain (CARDIA)    Difficulty of Paying Living Expenses: Not hard at all  Food Insecurity: No Food Insecurity (06/10/2024)   Epic    Worried About Programme Researcher, Broadcasting/film/video in the Last Year: Never true    Ran Out of Food in the Last Year: Never true  Transportation Needs: No Transportation Needs (06/10/2024)   Epic    Lack of Transportation (Medical): No    Lack of Transportation (Non-Medical): No  Physical Activity: Sufficiently Active (02/13/2024)   Exercise Vital Sign    Days of Exercise per Week: 4 days    Minutes of Exercise per Session: 40 min  Stress: Stress Concern Present (02/13/2024)   [employer] of Occupational Health - Occupational Stress Questionnaire    Feeling of Stress: To some extent  Social Connections: Moderately Isolated (06/10/2024)   Social Connection and Isolation Panel    Frequency of Communication with Friends and Family: Once a week    Frequency of Social Gatherings with Friends and Family: Once a week    Attends Religious Services: 1 to 4 times per year    Active Member of [employer] or Organizations: No    Attends Banker Meetings: Not on file    Marital Status: Married  Depression (PHQ2-9): Medium Risk (02/13/2024)   Depression (PHQ2-9)    PHQ-2 Score: 5  Alcohol Screen: Low Risk (06/10/2024)   Alcohol Screen    Last Alcohol Screening Score (AUDIT): 2  Housing: Low Risk (06/10/2024)   Epic    Unable to Pay for Housing in the Last Year: No    Number of Times Moved in the Last Year: 0    Homeless in the Last Year: No  Utilities: Not At Risk (02/13/2024)   Epic    Threatened with loss of utilities: No  Health Literacy: Adequate Health Literacy (02/13/2024)   B1300  Health Literacy    Frequency of need for help with medical instructions: Never    Family History  Problem Relation Age of Onset   Cancer Mother 13       breast ca died at age 27   Breast cancer  Mother 17   Depression Mother    Anxiety disorder Mother    Obesity Mother    Early death Mother    Diabetes Father    Hypertension Father    Stroke Father    Kidney disease Father    Heart disease Father    High Cholesterol Father    Obesity Father    Obesity Brother    Hypertension Brother    Heart disease Maternal Grandmother    Alzheimer's disease Maternal Grandmother    Hyperlipidemia Maternal Grandmother    Diabetes Paternal Grandmother    Obesity Paternal Grandmother    Heart disease Paternal Grandfather    Hypertension Other    Diabetes type II Other    Obesity Other     Health Maintenance  Topic Date Due   Hepatitis B Vaccines 19-59 Average Risk (1 of 3 - 19+ 3-dose series) Never done   Cervical Cancer Screening (HPV/Pap Cotest)  08/13/2023   Diabetic kidney evaluation - Urine ACR  02/02/2024   Mammogram  07/12/2024   Hepatitis C Screening  02/12/2025 (Originally [birth year]1982)   COVID-19 Vaccine (8 - 2025-26 season) 02/28/2025 (Originally 02/11/2024)   HEMOGLOBIN A1C  09/17/2024   OPHTHALMOLOGY EXAM  12/16/2024   FOOT EXAM  02/12/2025   Diabetic kidney evaluation - eGFR measurement  03/19/2025   DTaP/Tdap/Td (3 - Td or Tdap) 01/06/2032   Colonoscopy  05/29/2032   Pneumococcal Vaccine: 50+ Years  Completed   Influenza Vaccine  Completed   HIV Screening  Completed   Zoster Vaccines- Shingrix   Completed   HPV VACCINES  Aged Out   Meningococcal B Vaccine  Aged Out     ----------------------------------------------------------------------------------------------------------------------------------------------------------------------------------------------------------------- Physical Exam BP 133/76   Pulse 82   Temp 98.1 F (36.7 C) (Temporal)   Ht 5' 1 (1.549 m)    Wt 211 lb (95.7 kg)   SpO2 99%   BMI 39.87 kg/m   Physical Exam Constitutional:      Appearance: Normal appearance.  HENT:     Right Ear: Tympanic membrane normal.     Left Ear: Tympanic membrane normal.  Eyes:     General: No scleral icterus. Cardiovascular:     Rate and Rhythm: Normal rate and regular rhythm.  Pulmonary:     Effort: Pulmonary effort is normal.     Breath sounds: Normal breath sounds.  Musculoskeletal:     Cervical back: Neck supple.  Neurological:     Mental Status: She is alert.  Psychiatric:        Mood and Affect: Mood normal.        Behavior: Behavior normal.     ------------------------------------------------------------------------------------------------------------------------------------------------------------------------------------------------------------------- Assessment and Plan  Acute maxillary sinusitis Recently completed course of augmentin .  Overall seems to be doing better, but has not fully reoslved.  Continue supportive care.  I did send doxycycline  over and instructed on when to use this if symptoms do return since our clinic will be closed tomorrow.  Red flags reviewed.  F/u as needed.    Meds ordered this encounter  Medications   doxycycline  (VIBRA -TABS) 100 MG tablet    Sig: Take 1 tablet (100 mg total) by mouth 2 (two) times daily.    Dispense:  20 tablet    Refill:  0    No follow-ups on file.        [1]  Allergies Allergen Reactions   Chocolate Anaphylaxis   Corn-Containing Products Nausea Only    Fatigue, body aches, upset stomach,   Food  chocolate   Gluten Meal    Latex Rash   Methotrexate  Rash   "

## 2024-06-17 ENCOUNTER — Encounter: Payer: Self-pay | Admitting: Dermatology

## 2024-06-17 ENCOUNTER — Ambulatory Visit: Admitting: Dermatology

## 2024-06-17 DIAGNOSIS — L409 Psoriasis, unspecified: Secondary | ICD-10-CM | POA: Diagnosis not present

## 2024-06-17 DIAGNOSIS — D225 Melanocytic nevi of trunk: Secondary | ICD-10-CM | POA: Diagnosis not present

## 2024-06-17 DIAGNOSIS — I10 Essential (primary) hypertension: Secondary | ICD-10-CM | POA: Insufficient documentation

## 2024-06-17 DIAGNOSIS — D485 Neoplasm of uncertain behavior of skin: Secondary | ICD-10-CM | POA: Diagnosis not present

## 2024-06-17 DIAGNOSIS — K760 Fatty (change of) liver, not elsewhere classified: Secondary | ICD-10-CM | POA: Insufficient documentation

## 2024-06-17 DIAGNOSIS — R634 Abnormal weight loss: Secondary | ICD-10-CM | POA: Insufficient documentation

## 2024-06-17 DIAGNOSIS — K625 Hemorrhage of anus and rectum: Secondary | ICD-10-CM | POA: Insufficient documentation

## 2024-06-17 DIAGNOSIS — R195 Other fecal abnormalities: Secondary | ICD-10-CM | POA: Insufficient documentation

## 2024-06-17 DIAGNOSIS — R194 Change in bowel habit: Secondary | ICD-10-CM | POA: Insufficient documentation

## 2024-06-17 NOTE — Progress Notes (Signed)
 "  Follow-Up Visit   Subjective  Tabitha [name] is a 59 y.o. female who presents for the following: Psoriasis, Intertrigo & Spots  Patient present today for follow up visit for Psoriasis and Intertrigo. Patient was last evaluated on 12/27/2023.   Psoriasis: At this visit patient was advised to continue Bimzelyx. She was also prescribed Zoryve  Foam 0.3%.Zoryve  wasn't covered so she was switched to Calcipotriene  Ointment. Patient reports sxs are better. She was switched from Bimzelyx to [place] and it has helped tremendously. Patient reports medication changes.  Intertrigo: At this visit patient she was also prescribed Nystatin /TMC. She was also recommended to apply Z Absorb AF Powder. Patient reports sxs are better.   Spot Check: Patient states she has spots located at the back that she would like to have examined. Patient reports she is unsure of how long the spots have been there. She reports the areas are not bothersome. Patient reports she has not previously been treated for these areas. Patient denies Hx of bx. Patient denies family history of skin cancer(s).  117/77 81 Patient provided verbal consent for the use of an AI-assisted program to generate a detailed after-visit summary. The patient understands that the AI tool is used to support clinical documentation and that all information will be reviewed and verified by the healthcare provider.  The following portions of the chart were reviewed this encounter and updated as appropriate: medications, allergies, medical history  Review of Systems:  No other skin or systemic complaints except as noted in HPI or Assessment and Plan.  Objective  Well appearing patient in no apparent distress; mood and affect are within normal limits.  A full examination was performed including scalp, head, eyes, ears, nose, lips, neck, chest, axillae, abdomen, back, buttocks, bilateral upper extremities, bilateral lower extremities, hands, feet, fingers, toes,  fingernails, and toenails. All findings within normal limits unless otherwise noted below.   Relevant exam findings are noted in the Assessment and Plan.                Left Lower Back Medial 6.5 mm irregular brown Macule Left Lower Back Lateral 7 mm irregular brown Macule  Assessment & Plan   Atypical nevi, left lower back Two irregular brown macules on the left lower back, medial and lateral, measuring 6.5 mm and 7 mm respectively, with irregular borders. Noted by her husband, indicating a change in appearance.  - Removed both nevi via shave excision - Will send report in about a week  Psoriasis  Inverse psoriasis affecting the ears and breasts, significantly improved with Tremfya . Tremfya , an IL-23 inhibitor, is more effective than previous IL-17 inhibitors.  Joint pain has also improved. Tremfya  is administered every eight weeks.  - Continue Tremfya  as rx'd by Rheumatologist every eight weeks - Use Casabitrine for any minor flares - Advised wearing a mask in close quarters due to increased susceptibility to infections while on biologics NEOPLASM OF UNCERTAIN BEHAVIOR OF SKIN (2) Left Lower Back Medial - Epidermal / dermal shaving  Lesion diameter (cm):  0.6 Informed consent: discussed and consent obtained   Timeout: patient name, date of birth, surgical site, and procedure verified   Procedure prep:  Patient was prepped and draped in usual sterile fashion Prep type:  Isopropyl alcohol Anesthesia: the lesion was anesthetized in a standard fashion   Anesthetic:  1% lidocaine  w/ epinephrine  1-100,000 buffered w/ 8.4% NaHCO3 Instrument used: DermaBlade   Hemostasis achieved with: aluminum chloride   Outcome: patient tolerated procedure well  Post-procedure details: sterile dressing applied and wound care instructions given   Dressing type: petrolatum   Additional details:  Pt aware that benign results will be sent to mychart and the staff will call abnormal results  will  Specimen A - Surgical pathology Differential Diagnosis: R/O DN  Check Margins: No Left Lower Back Lateral - Epidermal / dermal shaving  Lesion diameter (cm):  0.7 Informed consent: discussed and consent obtained   Timeout: patient name, date of birth, surgical site, and procedure verified   Procedure prep:  Patient was prepped and draped in usual sterile fashion Prep type:  Isopropyl alcohol Anesthesia: the lesion was anesthetized in a standard fashion   Anesthetic:  1% lidocaine  w/ epinephrine  1-100,000 buffered w/ 8.4% NaHCO3 Instrument used: DermaBlade   Hemostasis achieved with: aluminum chloride   Outcome: patient tolerated procedure well   Post-procedure details: sterile dressing applied and wound care instructions given   Dressing type: petrolatum   Additional details:  Pt aware that benign results will be sent to mychart and the staff will call abnormal results will  Specimen B - Surgical pathology Differential Diagnosis: R/O DN  Check Margins: No  Return in about 1 year (around 06/17/2025) for Psoriasis F/U.  I, Jetta Ager, am acting as neurosurgeon for [employer], DO.  Documentation: I have reviewed the above documentation for accuracy and completeness, and I agree with the above.  Delon Lenis, DO   "

## 2024-06-17 NOTE — Patient Instructions (Signed)

## 2024-06-18 ENCOUNTER — Ambulatory Visit: Payer: Self-pay | Admitting: Dermatology

## 2024-06-18 LAB — SURGICAL PATHOLOGY

## 2024-06-20 ENCOUNTER — Other Ambulatory Visit: Payer: Self-pay

## 2024-06-23 ENCOUNTER — Other Ambulatory Visit (HOSPITAL_COMMUNITY): Payer: Self-pay

## 2024-06-23 ENCOUNTER — Other Ambulatory Visit: Payer: Self-pay

## 2024-06-23 NOTE — Progress Notes (Signed)
 Specialty Pharmacy Refill Coordination Note  MyChart Questionnaire Submission  Tabitha [name] is a 59 y.o. female contacted today regarding refills of specialty medication(s) Tremfya .  Doses on hand: 0   Next inj: 07/01/24  Patient requested: Pickup at [facility] date: 06/27/24  Medication will be filled on 06/26/24

## 2024-06-25 ENCOUNTER — Encounter (INDEPENDENT_AMBULATORY_CARE_PROVIDER_SITE_OTHER): Payer: Self-pay | Admitting: Internal Medicine

## 2024-06-25 ENCOUNTER — Ambulatory Visit (INDEPENDENT_AMBULATORY_CARE_PROVIDER_SITE_OTHER): Admitting: Internal Medicine

## 2024-06-25 VITALS — BP 126/76 | HR 79 | Temp 98.6°F | Ht 61.0 in | Wt 213.0 lb

## 2024-06-25 DIAGNOSIS — T383X5A Adverse effect of insulin and oral hypoglycemic [antidiabetic] drugs, initial encounter: Secondary | ICD-10-CM

## 2024-06-25 DIAGNOSIS — E1169 Type 2 diabetes mellitus with other specified complication: Secondary | ICD-10-CM

## 2024-06-25 DIAGNOSIS — Z6841 Body Mass Index (BMI) 40.0 and over, adult: Secondary | ICD-10-CM

## 2024-06-25 DIAGNOSIS — I10 Essential (primary) hypertension: Secondary | ICD-10-CM | POA: Diagnosis not present

## 2024-06-25 DIAGNOSIS — K5903 Drug induced constipation: Secondary | ICD-10-CM | POA: Diagnosis not present

## 2024-06-25 DIAGNOSIS — Z7985 Long-term (current) use of injectable non-insulin antidiabetic drugs: Secondary | ICD-10-CM | POA: Diagnosis not present

## 2024-06-25 DIAGNOSIS — E66813 Obesity, class 3: Secondary | ICD-10-CM | POA: Diagnosis not present

## 2024-06-25 DIAGNOSIS — G4733 Obstructive sleep apnea (adult) (pediatric): Secondary | ICD-10-CM | POA: Diagnosis not present

## 2024-06-25 DIAGNOSIS — E669 Obesity, unspecified: Secondary | ICD-10-CM

## 2024-06-25 NOTE — Assessment & Plan Note (Signed)
 Lab Results  Component Value Date   HGBA1C 5.6 03/19/2024   HGBA1C 6.0 (A) 12/19/2023   HGBA1C 6.7 07/12/2023   Stable at this time. Glycemic control is being addressed through the weight management plan above inclusive of GLP-1, with expected improvement in insulin  resistance and metabolic parameters as weight loss progresses. Will continue to monitor A1c and glucose trends.  We will increase Mounjaro  to 7.5 mg once a week for weight management

## 2024-06-25 NOTE — Progress Notes (Signed)
 " Darlyn Claudene [facility] [address] [place] [address] Phone: [phone] Subjective:   Tabitha [name] am a scribe for Dr. Claudene.   I'm seeing this patient by the request  of:  Alvia Bring, DO  CC: Back and neck pain follow-up  [name]  Tabitha [name] is a 59 y.o. female coming in with complaint of back and neck pain. OMT 05/06/2024. Patient states that they are both seized up today. Woke up flared today. Everything is a little sore.  Medications patient has been prescribed: None  Taking:         Reviewed prior external information including notes and imaging from previsou exam, outside providers and external EMR if available.   As well as notes that were available from care everywhere and other healthcare systems.  Past medical history, social, surgical and family history all reviewed in electronic medical record.  No pertanent information unless stated regarding to the chief complaint.   Past Medical History:  Diagnosis Date   A-fib [facility])    Alcohol abuse    Allergy 15+ Years ago   Chocolate seasonal fall allergies   Anemia    Anxiety    Arrhythmia May 2020   Arthritis    Asthma    Back pain    Clotting disorder    chronic nose bleeds since childhood   De Quervain's disease (tenosynovitis)    Depression Previous history not currently being treated   Dermatophytosis, scalp    Diabetes mellitus    Drug use    Dysrhythmia    afib   Edema of both lower extremities    Fatty liver    Female infertility    Gallbladder problem    Hypercholesteremia    Joint pain    Obesity    Osteoarthritis    Palpitations    PCOS (polycystic ovarian syndrome)    Pneumonia    Polycystic ovarian disease    PONV (postoperative nausea and vomiting)    Psoriatic arthritis (HCC)    Sleep apnea CPAP therapy started in [employer]   SOB (shortness of breath)    Vitamin D  deficiency    Wears glasses     Allergies[1]   Review of Systems:  No  headache, visual changes, nausea, vomiting, diarrhea, constipation, dizziness, abdominal pain, skin rash, fevers, chills, night sweats, weight loss, swollen lymph nodes, body aches, joint swelling, chest pain, shortness of breath, mood changes. POSITIVE muscle aches  Objective  Blood pressure 118/80, pulse 78, height 5' 1 (1.549 m), SpO2 99%.   General: No apparent distress alert and oriented x3 mood and affect normal, dressed appropriately.  HEENT: Pupils equal, extraocular movements intact  Respiratory: Patient's speak in full sentences and does not appear short of breath  Cardiovascular: No lower extremity edema, non tender, no erythema  Gait MSK:  Back more tightness noted in the paraspinal musculature.  Seems to be in the lumbar spine actually more than usual. Does have some difficulty with tension.  Negative Spurling's noted.  Osteopathic findings  C2 flexed rotated and side bent right C6 flexed rotated and side bent left T3 extended rotated and side bent right inhaled rib T9 extended rotated and side bent left L2 flexed rotated and side bent right L3 flexed rotated and side bent left Sacrum right on right     Assessment and Plan:  Low back pain Chronic, with mild exacerbation.  Likely secondary to may be some deconditioning with not being quite as active during  the winter months.  Discussed with patient about core strengthening again, patient is doing well with her weight loss.  Would like to keep the under 40 at least and goal would be under 30.  Patient does have psoriatic arthritis but likely no flare.  Follow-up with me again in 6 weeks    Nonallopathic problems  Decision today to treat with OMT was based on Physical Exam  After verbal consent patient was treated with HVLA, ME, FPR techniques in cervical, rib, thoracic, lumbar, and sacral  areas  Patient tolerated the procedure well with improvement in symptoms  Patient given exercises, stretches and lifestyle  modifications  See medications in patient instructions if given  Patient will follow up in 4-8 weeks     The above documentation has been reviewed and is accurate and complete Jomar Denz M Aric Jost, DO         Note: This dictation was prepared with Dragon dictation along with smaller phrase technology. Any transcriptional errors that result from this process are unintentional.            [1]  Allergies Allergen Reactions   Chocolate Anaphylaxis   Corn-Containing Products Nausea Only    Fatigue, body aches, upset stomach,   Food     chocolate   Gluten Meal    Latex Rash   Methotrexate  Rash   "

## 2024-06-25 NOTE — Assessment & Plan Note (Addendum)
" °  Management with recent weight gain of two pounds since last visit. Following a 1200 calorie nutrition plan 60% of the time. Not exercising due to recent illness. Significant weight loss from 280 pounds to 213 pounds, but recent weight gain due to decreased physical activity and stress. Current treatment with Mounjaro  at 5 mg, experiencing gastrointestinal side effects including constipation. Discussion on increasing Mounjaro  to 7.5 mg to address rapid gastric emptying and improve appetite control. Emphasis on the importance of physical activity and dietary adjustments to maintain weight loss. Need for calorie adjustment as body weight decreases, with a target of under 1600 calories for weight loss. - Increased Mounjaro  to 7.5 mg. - Monitor response to 7.5 mg dose and decide on further increase to 10 mg based on tolerance. - Maintain calorie intake under 1600 with a target of 1200 calories for weight loss. - Encouraged physical activity as tolerated.  "

## 2024-06-25 NOTE — Assessment & Plan Note (Signed)
 Stable on current therapy. Weight reduction is anticipated to improve OSA severity. Continue current management; reassess symptoms as weight loss progresses.

## 2024-06-25 NOTE — Assessment & Plan Note (Signed)
 Chronic constipation exacerbated by Mounjaro  and other medications such as doxepin  and Adderall. Reports irregular bowel movements and reliance on Miralax. Importance of dietary fiber, hydration, and physical activity in managing constipation discussed. Need for a bowel regimen to maintain tolerance to Mounjaro . - Continue Miralax as needed for bowel regulation. - Increase dietary fiber intake to 25 grams per day. - Ensure adequate hydration with 90 ounces of water daily. - Encourage regular physical activity to stimulate bowel movements.

## 2024-06-25 NOTE — Progress Notes (Signed)
 "  Lucas Parker, MD ABIM, ABOM  [address] [place], [place], [place] [address] Office: [phone]  /  Fax: [phone]    Weight Summary and Body Composition Analysis (BIA)  Vitals Temp: 98.6 F (37 C) BP: 126/76 Pulse Rate: 79 SpO2: 98 %   Anthropometric Measurements Height: 5' 1 (1.549 m) Weight: 213 lb (96.6 kg) BMI (Calculated): 40.27 Weight at Last Visit: 211 lb Weight Lost Since Last Visit: 0 lb Weight Gained Since Last Visit: 2 lb Starting Weight: 218 lb Total Weight Loss (lbs): 5 lb (2.268 kg) Peak Weight: 280 lb   Body Composition  Body Fat %: 45.1 % Fat Mass (lbs): 96.2 lbs Muscle Mass (lbs): 111 lbs Total Body Water (lbs): 75.6 lbs Visceral Fat Rating : 14    RMR: 1886  Today's Visit #: 13  Starting Date: 07/12/23   Subjective   Chief Complaint: Obesity  Interval History  Discussed the use of AI scribe software for clinical note transcription with the patient, who gave verbal consent to proceed.  History of Present Illness The patient presents for medical weight management.  She reports following a 1200-calorie nutrition plan about 60% of the time. Her recent illness, including being sick the week before and during Christmas, and her husband's illness during New Year's, has hindered her ability to exercise. She currently experiences slight congestion, a new symptom.  Her shoulder condition has improved, and she continues with physical therapy. She has not resumed training with her personal trainer but intends to do so after completing physical therapy this month. She previously trained at [employer] and is contemplating switching to a different facility.  Her weight history includes a significant reduction from 280 pounds to 240 pounds in 2021, and further down to 213 pounds by January 2023. However, she has gained weight, reaching 231 pounds over the past year, which she attributes to changes in nutrition and physical activity due to her  best friend's stage four ovarian cancer and increased travel.  She is currently on Mounjaro  5 mg and experiences gastrointestinal side effects, including irregular bowel movements, for which she uses Miralax. She has three doses of 7.5 mg at home from before her shoulder surgery and plans to resume this dosage. She also takes doxepin  and Adderall, which may contribute to constipation.  Her sleep has been disrupted for the past six months but is improving as she adjusts her schedule. She attributes some of the sleep issues to her husband's second shift work schedule. She has resumed taking naloxone, which she finds helpful for her sleep.     Challenges affecting patient progress: Strong orixegenic signaling, low volumes of physical activity at present time   Pharmacotherapy for weight management: Mounjaro  5 mg once a week primarily for diabetes secondary obesity patient presents today for medical weight management since last office visit she has gained 2 pounds she is following a 1200-calorie nutrition plan 60% of the time she has not been exercising that she has been sick  Assessment and Plan   Treatment Plan For Obesity:  Recommended Dietary Goals  [name] is currently in the action stage of change. As such, her goal is to continue weight management plan. She has agreed to: continue current plan  Behavioral Health and Counseling  We discussed the following behavioral modification strategies today: continue to work on maintaining a reduced calorie state, getting the recommended amount of protein, incorporating whole foods, making healthy choices, staying well hydrated and practicing mindfulness when eating. and increase protein  intake, fibrous foods (25 grams per day for women, 30 grams for men) and water to improve satiety and decrease hunger signals. .  Additional education and resources provided today: None  Recommended Physical Activity Goals  [name] has been advised to work up to  240 minutes of combined endurance and strengthening exercises per week for cardiovascular health, weight loss maintenance and preservation of muscle mass.  She has agreed to :  Think about enjoyable ways to increase daily physical activity and overcoming barriers to exercise, Increase physical activity in their day and reduce sedentary time (increase NEAT)., Increase volume of physical activity to a goal of 240 minutes a week, and Combine aerobic and strengthening exercises for efficiency and improved cardiometabolic health.  Medical Interventions and Pharmacotherapy  We have reviewed current or available treatment options to assist her with their weight loss efforts and we agreed to : Increase Mounjaro  to 7.5 mg once a week  Associated Conditions Impacted by Medically Supervised Weight Management Plan  Assessment & Plan OSA (obstructive sleep apnea) Stable on current therapy. Weight reduction is anticipated to improve OSA severity. Continue current management; reassess symptoms as weight loss progresses.  Essential hypertension Vitals:   06/25/24 0700  BP: 126/76   Stable. Blood pressure control expected to improve with continued weight reduction. No medication changes indicated today; continue current antihypertensive regimen and home BP monitoring.   Type 2 diabetes mellitus in patient with obesity [facility]) Lab Results  Component Value Date   HGBA1C 5.6 03/19/2024   HGBA1C 6.0 (A) 12/19/2023   HGBA1C 6.7 07/12/2023   Stable at this time. Glycemic control is being addressed through the weight management plan above inclusive of GLP-1, with expected improvement in insulin  resistance and metabolic parameters as weight loss progresses. Will continue to monitor A1c and glucose trends.  We will increase Mounjaro  to 7.5 mg once a week for weight management   Class 3 severe obesity with serious comorbidity and body mass index (BMI) of 40.0 to 44.9 in adult, unspecified obesity type  (HCC)  Management with recent weight gain of two pounds since last visit. Following a 1200 calorie nutrition plan 60% of the time. Not exercising due to recent illness. Significant weight loss from 280 pounds to 213 pounds, but recent weight gain due to decreased physical activity and stress. Current treatment with Mounjaro  at 5 mg, experiencing gastrointestinal side effects including constipation. Discussion on increasing Mounjaro  to 7.5 mg to address rapid gastric emptying and improve appetite control. Emphasis on the importance of physical activity and dietary adjustments to maintain weight loss. Need for calorie adjustment as body weight decreases, with a target of under 1600 calories for weight loss. - Increased Mounjaro  to 7.5 mg. - Monitor response to 7.5 mg dose and decide on further increase to 10 mg based on tolerance. - Maintain calorie intake under 1600 with a target of 1200 calories for weight loss. - Encouraged physical activity as tolerated.  Drug-induced constipation Chronic constipation exacerbated by Mounjaro  and other medications such as doxepin  and Adderall. Reports irregular bowel movements and reliance on Miralax. Importance of dietary fiber, hydration, and physical activity in managing constipation discussed. Need for a bowel regimen to maintain tolerance to Mounjaro . - Continue Miralax as needed for bowel regulation. - Increase dietary fiber intake to 25 grams per day. - Ensure adequate hydration with 90 ounces of water daily. - Encourage regular physical activity to stimulate bowel movements.        Objective   Physical Exam:  Blood pressure 126/76, pulse 79, temperature 98.6 F (37 C), height 5' 1 (1.549 m), weight 213 lb (96.6 kg), SpO2 98%. Body mass index is 40.25 kg/m.  General: She is overweight, cooperative, alert, well developed, and in no acute distress. PSYCH: Has normal mood, affect and thought process.   HEENT: EOMI, sclerae are anicteric. Lungs:  Normal breathing effort, no conversational dyspnea. Extremities: No edema.  Neurologic: No gross sensory or motor deficits. No tremors or fasciculations noted.    Diagnostic Data Reviewed:  BMET    Component Value Date/Time   NA 138 03/19/2024 0834   NA 139 07/12/2023 0000   K 4.3 03/19/2024 0834   CL 104 03/19/2024 0834   CO2 22 03/19/2024 0834   GLUCOSE 174 (H) 03/19/2024 0834   BUN 25 (H) 03/19/2024 0834   BUN 15 07/12/2023 0000   CREATININE 0.80 03/19/2024 0834   CREATININE 0.72 04/04/2022 0000   CALCIUM 9.7 03/19/2024 0834   GFRNONAA >60 03/19/2024 0834   GFRNONAA 98 08/21/2019 0834   GFRAA 114 08/21/2019 0834   Lab Results  Component Value Date   HGBA1C 5.6 03/19/2024   HGBA1C 7.2 09/06/2011   Lab Results  Component Value Date   INSULIN  19.3 07/25/2023   Lab Results  Component Value Date   TSH 1.23 09/15/2022   CBC    Component Value Date/Time   WBC 8.3 03/19/2024 0834   RBC 4.88 03/19/2024 0834   HGB 14.2 03/19/2024 0834   HCT 44.3 03/19/2024 0834   PLT 187 03/19/2024 0834   MCV 90.8 03/19/2024 0834   MCH 29.1 03/19/2024 0834   MCHC 32.1 03/19/2024 0834   RDW 12.3 03/19/2024 0834   Iron Studies    Component Value Date/Time   FERRITIN 71 09/15/2022 0000   Lipid Panel     Component Value Date/Time   CHOL 183 07/25/2023 0754   TRIG 119 07/25/2023 0754   HDL 54 07/25/2023 0754   CHOLHDL 2.4 04/04/2022 0000   VLDL 25.0 09/25/2019 0920   LDLCALC 108 (H) 07/25/2023 0754   LDLCALC 87 04/04/2022 0000   LDLDIRECT 149 (H) 12/06/2011 1702   Hepatic Function Panel     Component Value Date/Time   PROT 7.0 04/04/2022 0000   ALBUMIN 4.5 07/12/2023 0000   AST 23 07/12/2023 0000   ALT 33 07/12/2023 0000   ALKPHOS 99 07/12/2023 0000   BILITOT 0.5 04/04/2022 0000      Component Value Date/Time   TSH 1.23 09/15/2022 0000   TSH 1.02 12/11/2018 1102   Nutritional Lab Results  Component Value Date   VD25OH 57.2 07/25/2023   VD25OH 38.8 09/15/2022     Medications: Outpatient Encounter Medications as of 06/25/2024  Medication Sig Note   ACCU-CHEK GUIDE test strip     acetaminophen  (TYLENOL ) 325 MG tablet Take 650 mg by mouth every 6 (six) hours as needed for moderate pain (pain score 4-6).    Amphetamine -Dextroamphetamine  (ADDERALL PO)     amphetamine -dextroamphetamine  (ADDERALL) 10 MG tablet Take 1 tablet (10 mg total) by mouth daily as directed.    calcipotriene  (DOVONOX) 0.005 % ointment     celecoxib  (CELEBREX ) 100 MG capsule Take 1 capsule (100 mg total) by mouth 2 (two) times daily. 03/18/2024: ON HOLD   Continuous Glucose Sensor (FREESTYLE LIBRE 3 PLUS SENSOR) MISC Change sensor every 15 days.    doxepin  (SINEQUAN ) 10 MG capsule Take 1 capsule (10 mg total) by mouth at bedtime as needed.    doxycycline  (VIBRA -TABS) 100 MG  tablet Take 1 tablet (100 mg total) by mouth 2 (two) times daily.    estradiol  (ESTRACE ) 0.1 MG/GM vaginal cream Insert one gram vaginally every other day for 2 weeks.  Then insert one gram vaginally twice a week for maintenence.    glucose blood (FREESTYLE LITE) test strip Use to check blood sugar 3 times a day    guselkumab  (TREMFYA  PEN) 100 MG/ML pen Inject 100mg  subcutanously at week 4 then every 8 weeks as directed    Guselkumab  (TREMFYA  PEN) 200 MG/2ML SOAJ     lisdexamfetamine  (VYVANSE ) 60 MG capsule Take 1 capsule (60 mg total) by mouth daily. (Patient taking differently: Take 50 mg by mouth daily.)    magnesium gluconate (MAGONATE) 500 (27 Mg) MG TABS tablet Take 500 mg by mouth in the morning and at bedtime. Magnesium gluconoate and citrate pt takes 1000mg  in pm for atrial fib    metFORMIN  (GLUCOPHAGE -XR) 500 MG 24 hr tablet Take 1 tablet (500 mg total) by mouth every evening.    Omega-3 Fatty Acids (FISH OIL) 1200 MG CAPS Take 1,200-2,400 mg by mouth See admin instructions. Take 2,400 mg by mouth in the morning and 1,200 mg in the evening 03/18/2024: ON HOLD   ondansetron  (ZOFRAN -ODT) 4 MG disintegrating  tablet Take 1 tablet (4 mg total) by mouth every 8 (eight) hours as needed.    Sod Picosulfate-Mag Ox-Cit Acd (CLENPIQ ) 10-3.5-12 MG-GM -GM/175ML SOLN 175 ML ORALLY TWICE; Duration: 2    tirzepatide  (MOUNJARO ) 5 MG/0.5ML Pen Inject 5 mg into the skin once a week.    No facility-administered encounter medications on file as of 06/25/2024.     Follow-Up   No follow-ups on file.SABRA She was informed of the importance of frequent follow up visits to maximize her success with intensive lifestyle modifications for her multiple health conditions.  Attestation Statement   Reviewed by clinician on day of visit: allergies, medications, problem list, medical history, surgical history, family history, social history, and previous encounter notes.     Lucas Parker, MD ABIM, ABOM "

## 2024-06-25 NOTE — Assessment & Plan Note (Signed)
 Vitals:   06/25/24 0700  BP: 126/76   Stable. Blood pressure control expected to improve with continued weight reduction. No medication changes indicated today; continue current antihypertensive regimen and home BP monitoring.

## 2024-06-26 ENCOUNTER — Encounter: Payer: Self-pay | Admitting: Family Medicine

## 2024-06-26 ENCOUNTER — Ambulatory Visit: Admitting: Family Medicine

## 2024-06-26 ENCOUNTER — Other Ambulatory Visit: Payer: Self-pay

## 2024-06-26 VITALS — BP 118/80 | HR 78 | Ht 61.0 in

## 2024-06-26 DIAGNOSIS — M9908 Segmental and somatic dysfunction of rib cage: Secondary | ICD-10-CM | POA: Diagnosis not present

## 2024-06-26 DIAGNOSIS — M545 Low back pain, unspecified: Secondary | ICD-10-CM

## 2024-06-26 DIAGNOSIS — M9904 Segmental and somatic dysfunction of sacral region: Secondary | ICD-10-CM | POA: Diagnosis not present

## 2024-06-26 DIAGNOSIS — M9902 Segmental and somatic dysfunction of thoracic region: Secondary | ICD-10-CM | POA: Diagnosis not present

## 2024-06-26 DIAGNOSIS — M9903 Segmental and somatic dysfunction of lumbar region: Secondary | ICD-10-CM

## 2024-06-26 DIAGNOSIS — M9901 Segmental and somatic dysfunction of cervical region: Secondary | ICD-10-CM

## 2024-06-26 DIAGNOSIS — G8929 Other chronic pain: Secondary | ICD-10-CM | POA: Diagnosis not present

## 2024-06-26 NOTE — Patient Instructions (Signed)
 Good to see you. Got a lot of good movement. Check in before and after [place].

## 2024-06-26 NOTE — Assessment & Plan Note (Signed)
 Chronic, with mild exacerbation.  Likely secondary to may be some deconditioning with not being quite as active during the winter months.  Discussed with patient about core strengthening again, patient is doing well with her weight loss.  Would like to keep the under 40 at least and goal would be under 30.  Patient does have psoriatic arthritis but likely no flare.  Follow-up with me again in 6 weeks

## 2024-07-09 ENCOUNTER — Other Ambulatory Visit (HOSPITAL_COMMUNITY): Payer: Self-pay

## 2024-07-09 ENCOUNTER — Other Ambulatory Visit: Payer: Self-pay

## 2024-07-09 MED ORDER — LISDEXAMFETAMINE DIMESYLATE 50 MG PO CAPS
50.0000 mg | ORAL_CAPSULE | Freq: Every day | ORAL | 0 refills | Status: AC
  Filled 2024-07-09: qty 30, 30d supply, fill #0

## 2024-07-14 ENCOUNTER — Other Ambulatory Visit: Payer: Self-pay

## 2024-07-14 ENCOUNTER — Telehealth: Payer: Self-pay

## 2024-07-14 NOTE — Telephone Encounter (Signed)
 Pharmacy Patient Advocate Encounter   Received notification from Pt Calls Messages that prior authorization for Tremfya  is required/requested.   Insurance verification completed.   The patient is insured through [facility].   Per test claim: PA required; PA submitted to above mentioned insurance via Latent Key/confirmation #[id number] [facility] Status is pending

## 2024-07-16 ENCOUNTER — Ambulatory Visit: Admitting: Family Medicine

## 2024-07-18 ENCOUNTER — Other Ambulatory Visit: Payer: Self-pay

## 2024-07-18 ENCOUNTER — Other Ambulatory Visit (HOSPITAL_COMMUNITY): Payer: Self-pay

## 2024-07-18 MED ORDER — ESTRADIOL 0.01 % VA CREA
TOPICAL_CREAM | VAGINAL | 10 refills | Status: AC
  Filled 2024-07-18: qty 42.5, 90d supply, fill #0

## 2024-07-30 ENCOUNTER — Ambulatory Visit: Admitting: Family Medicine

## 2024-07-31 ENCOUNTER — Ambulatory Visit (INDEPENDENT_AMBULATORY_CARE_PROVIDER_SITE_OTHER): Admitting: Internal Medicine

## 2024-08-13 ENCOUNTER — Ambulatory Visit: Admitting: Dermatology

## 2024-08-28 ENCOUNTER — Ambulatory Visit (INDEPENDENT_AMBULATORY_CARE_PROVIDER_SITE_OTHER): Admitting: Internal Medicine

## 2024-08-29 ENCOUNTER — Ambulatory Visit: Admitting: Family Medicine
